# Patient Record
Sex: Female | Born: 1945
Health system: Southern US, Community
[De-identification: ages and names within clinical notes are randomized; demographics above are authoritative.]

## PROBLEM LIST (undated history)

## (undated) DIAGNOSIS — K760 Fatty (change of) liver, not elsewhere classified: Secondary | ICD-10-CM

## (undated) DIAGNOSIS — IMO0001 Reserved for inherently not codable concepts without codable children: Secondary | ICD-10-CM

## (undated) DIAGNOSIS — R531 Weakness: Secondary | ICD-10-CM

## (undated) DIAGNOSIS — N189 Chronic kidney disease, unspecified: Secondary | ICD-10-CM

## (undated) DIAGNOSIS — D649 Anemia, unspecified: Secondary | ICD-10-CM

## (undated) DIAGNOSIS — D62 Acute posthemorrhagic anemia: Secondary | ICD-10-CM

## (undated) DIAGNOSIS — Z860100 Personal history of colon polyps, unspecified: Secondary | ICD-10-CM

## (undated) DIAGNOSIS — E039 Hypothyroidism, unspecified: Secondary | ICD-10-CM

## (undated) DIAGNOSIS — F419 Anxiety disorder, unspecified: Secondary | ICD-10-CM

## (undated) DIAGNOSIS — R55 Syncope and collapse: Secondary | ICD-10-CM

## (undated) DIAGNOSIS — I456 Pre-excitation syndrome: Secondary | ICD-10-CM

## (undated) DIAGNOSIS — R112 Nausea with vomiting, unspecified: Secondary | ICD-10-CM

## (undated) DIAGNOSIS — M109 Gout, unspecified: Secondary | ICD-10-CM

## (undated) DIAGNOSIS — I10 Essential (primary) hypertension: Secondary | ICD-10-CM

## (undated) DIAGNOSIS — Z9289 Personal history of other medical treatment: Secondary | ICD-10-CM

## (undated) DIAGNOSIS — K5790 Diverticulosis of intestine, part unspecified, without perforation or abscess without bleeding: Secondary | ICD-10-CM

## (undated) DIAGNOSIS — I871 Compression of vein: Secondary | ICD-10-CM

## (undated) DIAGNOSIS — Z973 Presence of spectacles and contact lenses: Secondary | ICD-10-CM

## (undated) DIAGNOSIS — N393 Stress incontinence (female) (male): Secondary | ICD-10-CM

## (undated) DIAGNOSIS — Z9889 Other specified postprocedural states: Secondary | ICD-10-CM

## (undated) DIAGNOSIS — G8929 Other chronic pain: Secondary | ICD-10-CM

## (undated) DIAGNOSIS — J189 Pneumonia, unspecified organism: Secondary | ICD-10-CM

## (undated) DIAGNOSIS — M1711 Unilateral primary osteoarthritis, right knee: Secondary | ICD-10-CM

## (undated) DIAGNOSIS — D126 Benign neoplasm of colon, unspecified: Secondary | ICD-10-CM

## (undated) DIAGNOSIS — M549 Dorsalgia, unspecified: Secondary | ICD-10-CM

## (undated) DIAGNOSIS — M1990 Unspecified osteoarthritis, unspecified site: Secondary | ICD-10-CM

## (undated) DIAGNOSIS — E785 Hyperlipidemia, unspecified: Secondary | ICD-10-CM

## (undated) DIAGNOSIS — J40 Bronchitis, not specified as acute or chronic: Secondary | ICD-10-CM

## (undated) HISTORY — PX: OTHER SURGICAL HISTORY: SHX169

## (undated) HISTORY — PX: THYROID SURGERY: SHX805

## (undated) HISTORY — DX: Benign neoplasm of colon, unspecified: D12.6

## (undated) HISTORY — PX: APPENDECTOMY: SHX54

## (undated) HISTORY — PX: ABDOMINAL HYSTERECTOMY: SHX81

## (undated) HISTORY — DX: Fatty (change of) liver, not elsewhere classified: K76.0

## (undated) HISTORY — DX: Diverticulosis of intestine, part unspecified, without perforation or abscess without bleeding: K57.90

## (undated) HISTORY — PX: SKIN BIOPSY: SHX1

## (undated) HISTORY — DX: Hyperlipidemia, unspecified: E78.5

## (undated) HISTORY — PX: CARDIAC CATHETERIZATION: SHX172

## (undated) HISTORY — PX: ROTATOR CUFF REPAIR: SHX139

## (undated) HISTORY — PX: COLONOSCOPY: SHX174

## (undated) HISTORY — PX: BREAST BIOPSY: SHX20

## (undated) HISTORY — PX: BREAST ENHANCEMENT SURGERY: SHX7

## (undated) HISTORY — DX: Compression of vein: I87.1

## (undated) HISTORY — PX: BREAST EXCISIONAL BIOPSY: SUR124

## (undated) HISTORY — PX: CARPAL TUNNEL RELEASE: SHX101

---

## 1979-05-01 HISTORY — PX: CHOLECYSTECTOMY: SHX55

## 1997-07-29 ENCOUNTER — Encounter: Admission: RE | Admit: 1997-07-29 | Discharge: 1997-10-27 | Payer: Self-pay | Admitting: Orthopedic Surgery

## 1997-09-06 ENCOUNTER — Ambulatory Visit (HOSPITAL_COMMUNITY): Admission: RE | Admit: 1997-09-06 | Discharge: 1997-09-06 | Payer: Self-pay | Admitting: Orthopedic Surgery

## 1997-09-10 ENCOUNTER — Ambulatory Visit (HOSPITAL_COMMUNITY): Admission: RE | Admit: 1997-09-10 | Discharge: 1997-09-10 | Payer: Self-pay | Admitting: Orthopedic Surgery

## 1997-09-20 ENCOUNTER — Ambulatory Visit (HOSPITAL_COMMUNITY): Admission: RE | Admit: 1997-09-20 | Discharge: 1997-09-20 | Payer: Self-pay | Admitting: Orthopedic Surgery

## 1997-10-05 ENCOUNTER — Ambulatory Visit (HOSPITAL_BASED_OUTPATIENT_CLINIC_OR_DEPARTMENT_OTHER): Admission: RE | Admit: 1997-10-05 | Discharge: 1997-10-05 | Payer: Self-pay | Admitting: Orthopedic Surgery

## 1997-10-19 ENCOUNTER — Encounter: Admission: RE | Admit: 1997-10-19 | Discharge: 1998-01-17 | Payer: Self-pay | Admitting: Orthopedic Surgery

## 1997-12-20 ENCOUNTER — Encounter: Payer: Self-pay | Admitting: Orthopedic Surgery

## 1997-12-20 ENCOUNTER — Ambulatory Visit (HOSPITAL_COMMUNITY): Admission: RE | Admit: 1997-12-20 | Discharge: 1997-12-20 | Payer: Self-pay | Admitting: Orthopedic Surgery

## 1998-01-11 ENCOUNTER — Ambulatory Visit (HOSPITAL_BASED_OUTPATIENT_CLINIC_OR_DEPARTMENT_OTHER): Admission: RE | Admit: 1998-01-11 | Discharge: 1998-01-11 | Payer: Self-pay | Admitting: Orthopedic Surgery

## 1998-02-07 ENCOUNTER — Encounter: Admission: RE | Admit: 1998-02-07 | Discharge: 1998-05-08 | Payer: Self-pay | Admitting: Orthopedic Surgery

## 1998-05-16 ENCOUNTER — Encounter: Admission: RE | Admit: 1998-05-16 | Discharge: 1998-06-27 | Payer: Self-pay

## 1998-08-05 ENCOUNTER — Encounter: Admission: RE | Admit: 1998-08-05 | Discharge: 1998-11-03 | Payer: Self-pay | Admitting: Orthopedic Surgery

## 1999-04-12 ENCOUNTER — Encounter: Admission: RE | Admit: 1999-04-12 | Discharge: 1999-04-12 | Payer: Self-pay | Admitting: Obstetrics and Gynecology

## 1999-04-12 ENCOUNTER — Encounter: Payer: Self-pay | Admitting: Obstetrics and Gynecology

## 1999-06-23 ENCOUNTER — Encounter (INDEPENDENT_AMBULATORY_CARE_PROVIDER_SITE_OTHER): Payer: Self-pay | Admitting: Specialist

## 1999-06-23 ENCOUNTER — Ambulatory Visit (HOSPITAL_BASED_OUTPATIENT_CLINIC_OR_DEPARTMENT_OTHER): Admission: RE | Admit: 1999-06-23 | Discharge: 1999-06-23 | Payer: Self-pay | Admitting: Orthopedic Surgery

## 1999-07-22 ENCOUNTER — Encounter: Payer: Self-pay | Admitting: Family Medicine

## 1999-07-22 ENCOUNTER — Encounter: Admission: RE | Admit: 1999-07-22 | Discharge: 1999-07-22 | Payer: Self-pay | Admitting: Family Medicine

## 2000-11-13 ENCOUNTER — Encounter: Payer: Self-pay | Admitting: Gastroenterology

## 2000-11-13 ENCOUNTER — Ambulatory Visit (HOSPITAL_COMMUNITY): Admission: RE | Admit: 2000-11-13 | Discharge: 2000-11-13 | Payer: Self-pay | Admitting: Gastroenterology

## 2000-11-13 ENCOUNTER — Encounter (INDEPENDENT_AMBULATORY_CARE_PROVIDER_SITE_OTHER): Payer: Self-pay | Admitting: *Deleted

## 2001-01-14 ENCOUNTER — Encounter: Payer: Self-pay | Admitting: General Surgery

## 2001-01-16 ENCOUNTER — Ambulatory Visit (HOSPITAL_COMMUNITY): Admission: RE | Admit: 2001-01-16 | Discharge: 2001-01-17 | Payer: Self-pay | Admitting: General Surgery

## 2001-01-16 ENCOUNTER — Encounter (INDEPENDENT_AMBULATORY_CARE_PROVIDER_SITE_OTHER): Payer: Self-pay | Admitting: *Deleted

## 2001-04-07 ENCOUNTER — Encounter: Payer: Self-pay | Admitting: General Surgery

## 2001-04-07 ENCOUNTER — Ambulatory Visit (HOSPITAL_COMMUNITY): Admission: RE | Admit: 2001-04-07 | Discharge: 2001-04-07 | Payer: Self-pay | Admitting: General Surgery

## 2001-04-18 ENCOUNTER — Other Ambulatory Visit: Admission: RE | Admit: 2001-04-18 | Discharge: 2001-04-18 | Payer: Self-pay | Admitting: Obstetrics and Gynecology

## 2001-05-16 ENCOUNTER — Emergency Department (HOSPITAL_COMMUNITY): Admission: EM | Admit: 2001-05-16 | Discharge: 2001-05-16 | Payer: Self-pay | Admitting: *Deleted

## 2002-01-19 ENCOUNTER — Ambulatory Visit (HOSPITAL_COMMUNITY): Admission: RE | Admit: 2002-01-19 | Discharge: 2002-01-19 | Payer: Self-pay | Admitting: Specialist

## 2002-03-02 ENCOUNTER — Ambulatory Visit (HOSPITAL_COMMUNITY): Admission: RE | Admit: 2002-03-02 | Discharge: 2002-03-02 | Payer: Self-pay | Admitting: Specialist

## 2003-08-05 ENCOUNTER — Other Ambulatory Visit: Admission: RE | Admit: 2003-08-05 | Discharge: 2003-08-05 | Payer: Self-pay | Admitting: Obstetrics and Gynecology

## 2004-01-26 ENCOUNTER — Ambulatory Visit (HOSPITAL_COMMUNITY): Admission: RE | Admit: 2004-01-26 | Discharge: 2004-01-26 | Payer: Self-pay | Admitting: Neurosurgery

## 2004-02-23 ENCOUNTER — Inpatient Hospital Stay (HOSPITAL_COMMUNITY): Admission: RE | Admit: 2004-02-23 | Discharge: 2004-02-26 | Payer: Self-pay | Admitting: Neurosurgery

## 2004-04-30 HISTORY — PX: REDUCTION MAMMAPLASTY: SUR839

## 2004-04-30 HISTORY — PX: BACK SURGERY: SHX140

## 2004-09-04 ENCOUNTER — Inpatient Hospital Stay (HOSPITAL_COMMUNITY): Admission: AD | Admit: 2004-09-04 | Discharge: 2004-09-07 | Payer: Self-pay | Admitting: Internal Medicine

## 2004-09-08 ENCOUNTER — Ambulatory Visit (HOSPITAL_COMMUNITY): Admission: RE | Admit: 2004-09-08 | Discharge: 2004-09-08 | Payer: Self-pay | Admitting: Internal Medicine

## 2004-10-03 ENCOUNTER — Ambulatory Visit: Payer: Self-pay | Admitting: Gastroenterology

## 2004-10-06 ENCOUNTER — Ambulatory Visit: Payer: Self-pay | Admitting: Gastroenterology

## 2004-10-10 ENCOUNTER — Ambulatory Visit (HOSPITAL_COMMUNITY): Admission: RE | Admit: 2004-10-10 | Discharge: 2004-10-10 | Payer: Self-pay | Admitting: Neurology

## 2004-10-17 ENCOUNTER — Ambulatory Visit (HOSPITAL_COMMUNITY): Admission: RE | Admit: 2004-10-17 | Discharge: 2004-10-17 | Payer: Self-pay | Admitting: Neurology

## 2005-01-07 ENCOUNTER — Ambulatory Visit (HOSPITAL_COMMUNITY): Admission: RE | Admit: 2005-01-07 | Discharge: 2005-01-07 | Payer: Self-pay | Admitting: Neurosurgery

## 2005-01-25 ENCOUNTER — Encounter: Admission: RE | Admit: 2005-01-25 | Discharge: 2005-04-25 | Payer: Self-pay | Admitting: Neurosurgery

## 2005-08-15 ENCOUNTER — Ambulatory Visit (HOSPITAL_BASED_OUTPATIENT_CLINIC_OR_DEPARTMENT_OTHER): Admission: RE | Admit: 2005-08-15 | Discharge: 2005-08-16 | Payer: Self-pay | Admitting: Plastic Surgery

## 2005-08-15 ENCOUNTER — Encounter (INDEPENDENT_AMBULATORY_CARE_PROVIDER_SITE_OTHER): Payer: Self-pay | Admitting: Specialist

## 2005-10-10 ENCOUNTER — Encounter: Admission: RE | Admit: 2005-10-10 | Discharge: 2006-01-08 | Payer: Self-pay | Admitting: Neurosurgery

## 2006-12-12 ENCOUNTER — Encounter: Admission: RE | Admit: 2006-12-12 | Discharge: 2006-12-12 | Payer: Self-pay | Admitting: Neurosurgery

## 2007-05-01 HISTORY — PX: KNEE ARTHROSCOPY: SHX127

## 2007-10-07 ENCOUNTER — Encounter (INDEPENDENT_AMBULATORY_CARE_PROVIDER_SITE_OTHER): Payer: Self-pay | Admitting: Neurosurgery

## 2007-10-07 ENCOUNTER — Emergency Department (HOSPITAL_COMMUNITY): Admission: EM | Admit: 2007-10-07 | Discharge: 2007-10-07 | Payer: Self-pay | Admitting: Emergency Medicine

## 2007-10-07 ENCOUNTER — Ambulatory Visit: Payer: Self-pay | Admitting: Surgery

## 2007-10-07 ENCOUNTER — Inpatient Hospital Stay (HOSPITAL_COMMUNITY): Admission: AD | Admit: 2007-10-07 | Discharge: 2007-10-08 | Payer: Self-pay | Admitting: Neurosurgery

## 2007-10-14 ENCOUNTER — Ambulatory Visit (HOSPITAL_BASED_OUTPATIENT_CLINIC_OR_DEPARTMENT_OTHER): Admission: RE | Admit: 2007-10-14 | Discharge: 2007-10-14 | Payer: Self-pay | Admitting: Orthopedic Surgery

## 2007-10-23 ENCOUNTER — Encounter: Admission: RE | Admit: 2007-10-23 | Discharge: 2007-11-17 | Payer: Self-pay | Admitting: Orthopedic Surgery

## 2008-04-30 HISTORY — PX: ANKLE ARTHROPLASTY: SUR68

## 2008-05-09 ENCOUNTER — Ambulatory Visit (HOSPITAL_COMMUNITY): Admission: RE | Admit: 2008-05-09 | Discharge: 2008-05-09 | Payer: Self-pay | Admitting: Sports Medicine

## 2008-05-20 ENCOUNTER — Encounter: Admission: RE | Admit: 2008-05-20 | Discharge: 2008-05-20 | Payer: Self-pay | Admitting: Neurosurgery

## 2008-06-08 ENCOUNTER — Ambulatory Visit (HOSPITAL_COMMUNITY): Admission: RE | Admit: 2008-06-08 | Discharge: 2008-06-08 | Payer: Self-pay | Admitting: Neurology

## 2008-06-15 ENCOUNTER — Encounter: Admission: RE | Admit: 2008-06-15 | Discharge: 2008-06-15 | Payer: Self-pay | Admitting: Neurology

## 2008-08-17 ENCOUNTER — Ambulatory Visit (HOSPITAL_COMMUNITY): Admission: RE | Admit: 2008-08-17 | Discharge: 2008-08-18 | Payer: Self-pay | Admitting: Orthopedic Surgery

## 2008-12-13 ENCOUNTER — Ambulatory Visit (HOSPITAL_COMMUNITY): Admission: RE | Admit: 2008-12-13 | Discharge: 2008-12-13 | Payer: Self-pay | Admitting: Neurology

## 2009-01-27 ENCOUNTER — Inpatient Hospital Stay (HOSPITAL_COMMUNITY): Admission: AD | Admit: 2009-01-27 | Discharge: 2009-02-01 | Payer: Self-pay | Admitting: Internal Medicine

## 2009-01-27 ENCOUNTER — Ambulatory Visit: Payer: Self-pay | Admitting: Internal Medicine

## 2009-02-01 ENCOUNTER — Encounter: Payer: Self-pay | Admitting: Internal Medicine

## 2009-02-09 ENCOUNTER — Encounter (INDEPENDENT_AMBULATORY_CARE_PROVIDER_SITE_OTHER): Payer: Self-pay | Admitting: *Deleted

## 2009-02-09 DIAGNOSIS — I10 Essential (primary) hypertension: Secondary | ICD-10-CM

## 2009-02-11 DIAGNOSIS — M479 Spondylosis, unspecified: Secondary | ICD-10-CM | POA: Insufficient documentation

## 2009-02-11 DIAGNOSIS — I2510 Atherosclerotic heart disease of native coronary artery without angina pectoris: Secondary | ICD-10-CM

## 2009-02-11 DIAGNOSIS — O99810 Abnormal glucose complicating pregnancy: Secondary | ICD-10-CM | POA: Insufficient documentation

## 2009-02-11 DIAGNOSIS — I456 Pre-excitation syndrome: Secondary | ICD-10-CM | POA: Insufficient documentation

## 2009-02-11 DIAGNOSIS — M19079 Primary osteoarthritis, unspecified ankle and foot: Secondary | ICD-10-CM | POA: Insufficient documentation

## 2009-02-11 DIAGNOSIS — H409 Unspecified glaucoma: Secondary | ICD-10-CM

## 2009-02-11 DIAGNOSIS — E041 Nontoxic single thyroid nodule: Secondary | ICD-10-CM

## 2009-02-23 ENCOUNTER — Ambulatory Visit: Payer: Self-pay | Admitting: Internal Medicine

## 2009-02-23 DIAGNOSIS — R197 Diarrhea, unspecified: Secondary | ICD-10-CM

## 2009-02-23 DIAGNOSIS — Z9189 Other specified personal risk factors, not elsewhere classified: Secondary | ICD-10-CM | POA: Insufficient documentation

## 2009-02-23 DIAGNOSIS — R21 Rash and other nonspecific skin eruption: Secondary | ICD-10-CM

## 2009-02-23 LAB — CONVERTED CEMR LAB
ALT: 18 units/L (ref 0–35)
AST: 11 units/L (ref 0–37)
BUN: 18 mg/dL (ref 6–23)
Basophils Relative: 0 % (ref 0–1)
Calcium: 9.7 mg/dL (ref 8.4–10.5)
Chloride: 102 meq/L (ref 96–112)
Creatinine, Ser: 0.98 mg/dL (ref 0.40–1.20)
Eosinophils Absolute: 0.5 10*3/uL (ref 0.0–0.7)
Eosinophils Relative: 8 % — ABNORMAL HIGH (ref 0–5)
HCT: 33.1 % — ABNORMAL LOW (ref 36.0–46.0)
MCHC: 32.3 g/dL (ref 30.0–36.0)
MCV: 83 fL (ref >=78.0)
Neutrophils Relative %: 54 % (ref 43–77)
Platelets: 238 10*3/uL (ref 150–400)
RDW: 15.8 % — ABNORMAL HIGH (ref 11.5–15.5)
Total Bilirubin: 0.3 mg/dL (ref 0.3–1.2)
WBC: 6.7 10*3/uL (ref 4.0–10.5)

## 2009-02-24 ENCOUNTER — Ambulatory Visit: Payer: Self-pay | Admitting: Internal Medicine

## 2009-02-28 ENCOUNTER — Telehealth (INDEPENDENT_AMBULATORY_CARE_PROVIDER_SITE_OTHER): Payer: Self-pay | Admitting: *Deleted

## 2009-03-03 ENCOUNTER — Ambulatory Visit: Payer: Self-pay | Admitting: Internal Medicine

## 2009-03-17 ENCOUNTER — Ambulatory Visit: Payer: Self-pay | Admitting: Gastroenterology

## 2009-03-17 ENCOUNTER — Encounter (INDEPENDENT_AMBULATORY_CARE_PROVIDER_SITE_OTHER): Payer: Self-pay | Admitting: *Deleted

## 2009-03-17 DIAGNOSIS — Z860100 Personal history of colon polyps, unspecified: Secondary | ICD-10-CM | POA: Insufficient documentation

## 2009-03-17 DIAGNOSIS — D509 Iron deficiency anemia, unspecified: Secondary | ICD-10-CM

## 2009-03-17 LAB — CONVERTED CEMR LAB: IgA: 427 mg/dL — ABNORMAL HIGH (ref 68–378)

## 2009-04-05 ENCOUNTER — Ambulatory Visit: Payer: Self-pay | Admitting: Gastroenterology

## 2009-04-08 ENCOUNTER — Encounter: Payer: Self-pay | Admitting: Gastroenterology

## 2009-06-08 ENCOUNTER — Ambulatory Visit (HOSPITAL_COMMUNITY): Admission: RE | Admit: 2009-06-08 | Discharge: 2009-06-08 | Payer: Self-pay | Admitting: Neurosurgery

## 2009-06-29 ENCOUNTER — Encounter: Admission: RE | Admit: 2009-06-29 | Discharge: 2009-09-27 | Payer: Self-pay | Admitting: Neurosurgery

## 2009-07-04 ENCOUNTER — Ambulatory Visit: Payer: Self-pay | Admitting: Gastroenterology

## 2009-07-04 DIAGNOSIS — K5730 Diverticulosis of large intestine without perforation or abscess without bleeding: Secondary | ICD-10-CM | POA: Insufficient documentation

## 2009-07-06 ENCOUNTER — Encounter: Admission: RE | Admit: 2009-07-06 | Discharge: 2009-07-06 | Payer: Self-pay | Admitting: Obstetrics and Gynecology

## 2009-09-30 ENCOUNTER — Encounter: Admission: RE | Admit: 2009-09-30 | Discharge: 2009-12-29 | Payer: Self-pay | Admitting: Neurosurgery

## 2010-02-07 ENCOUNTER — Encounter
Admission: RE | Admit: 2010-02-07 | Discharge: 2010-03-31 | Payer: Self-pay | Source: Home / Self Care | Attending: Internal Medicine | Admitting: Internal Medicine

## 2010-05-12 ENCOUNTER — Encounter
Admission: RE | Admit: 2010-05-12 | Discharge: 2010-05-30 | Payer: Self-pay | Source: Home / Self Care | Attending: Surgery | Admitting: Surgery

## 2010-05-21 ENCOUNTER — Encounter: Payer: Self-pay | Admitting: Orthopedic Surgery

## 2010-05-30 NOTE — Assessment & Plan Note (Signed)
Summary: FOLLOW UP AFTER COLON/YF   History of Present Illness Visit Type: Follow-up Visit Primary GI MD: Elie Goody MD [facility] Primary Provider: Jarome Matin, MD Chief Complaint: Follow up from colonoscopy,Pt needs refill on Levsin if she needs to continue med, pt stated Align was helping her bloating but has quit taking it because it is to expensive History of Present Illness:   Mr. Milleson returns for followup of intermittent diarrhea and bloating. She notes her diarrhea is frequently brought on by certain high-fiber foods, especially raw fruits and vegetables. She states that Align was helping but she has not used this recently for financial reasons. Her recent colonoscopy was unremarkable, including random colon biopsies.    GI Review of Systems      Denies abdominal pain, acid reflux, belching, bloating, chest pain, dysphagia with liquids, dysphagia with solids, heartburn, loss of appetite, nausea, vomiting, vomiting blood, weight loss, and  weight gain.      Reports diarrhea.     Denies anal fissure, black tarry stools, change in bowel habit, constipation, diverticulosis, fecal incontinence, heme positive stool, hemorrhoids, irritable bowel syndrome, jaundice, light color stool, liver problems, rectal bleeding, and  rectal pain.   Current Medications (verified): 1)  Lisinopril 20 Mg Tabs (Lisinopril) .... Take 1 Tablet By Mouth Once A Day 2)  Levoxyl 50 Mcg Tabs (Levothyroxine Sodium) .... Take 1 Tablet By Mouth Once A Day 3)  Alphagan P 0.15 % Soln (Brimonidine Tartrate) .... One Drop Each Eye Daily 4)  Xalatan 0.005 % Soln (Latanoprost) .... One Drop Each Eye Daily 5)  Alprazolam 0.5 Mg Tabs (Alprazolam) .... Take 1 Tablet By Mouth At Bedtime 6)  Darvocet-N 100 100-650 Mg Tabs (Propoxyphene N-Apap) .... Take One By Mouth As Needed 7)  Levsin 0.125 Mg Tabs (Hyoscyamine Sulfate) .Marland Kitchen.. 1-2 Tablets By Mouth Before Meals As Needed  Allergies: 1)  ! Codeine 2)  ! Talwin 3)   ! Demerol 4)  ! Hydrocodone 5)  ! Nexium 6)  ! * Ivp Dye 7)  ! Allopurinol (Allopurinol) 8)  ! * Colchicine  Past History:  Past Surgical History: Last updated: 03/17/2009 Appendectomy Cholecystectomy Hysterectomy Benign breast biopsy c section X 3 lumbar fusion arthoscopy right knee arthoscopy left ankle Carpal Tunnel Release bilateral X 2 nerve surgery bilateral X 2 Thyroid Surgery Cataract Extraction bilateral lens implant bilateral skin cancer moles removed multiple sites bladder tack  Family History: Last updated: 03/17/2009 No FH of Colon Cancer Family History of Diabetes: mother, maternal grandmother Family History of Heart Disease: father, maternal grandmother  Social History: Last updated: 03/17/2009 Divorced Patient has never smoked.  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         The pertinent positives and negatives are noted as above and in the HPI. All other ROS were reviewed and were negative.   Vital Signs:  Patient profile:   65 year old female Menstrual status:  postmenopausal Height:      63 inches Weight:      245 pounds BMI:     43.56 BSA:     2.11 Pulse rate:   66 /  minute Pulse rhythm:   regular BP sitting:   144 / 88  (left arm)  Vitals Entered By: Merri Ray CMA Duncan Dull) (July 04, 2009 9:09 AM)  Physical Exam  General:  Well developed, well nourished, no acute distress. Head:  Normocephalic and atraumatic. Eyes:  PERRLA, no icterus. Mouth:  No deformity or lesions, dentition normal. Lungs:   Clear throughout to auscultation. Heart:  Regular rate and rhythm; no murmurs, rubs,  or bruits. Abdomen:  Soft, nontender and nondistended. No masses, hepatosplenomegaly or hernias noted. Normal bowel sounds. Psych:  Alert and cooperative. Normal mood and affect.  Impression & Recommendations:  Problem # 1:  DIARRHEA (ICD-787.91) Diarrhea with intermittent bloating. Her symptoms appear to be functional and dietary related. She will adjust her diet as appropriate. Continue Align daily, or once every other day. Continue Levsin 1-2 before meals as needed.  Problem # 2:  DIVERTICULOSIS-COLON (ICD-562.10)  Patient Instructions: 1)  Please continue current medications.  2)  Please schedule a follow-up appointment as needed.  3)  Copy sent to : Jarome Matin, MD 4)  The medication list was reviewed and reconciled.  All changed / newly prescribed medications were explained.  A complete medication list was provided to the patient / caregiver.

## 2010-06-30 ENCOUNTER — Encounter: Payer: Commercial Managed Care - PPO | Attending: Surgery | Admitting: Dietician

## 2010-06-30 ENCOUNTER — Encounter: Admit: 2010-06-30 | Payer: Self-pay | Admitting: Internal Medicine

## 2010-06-30 DIAGNOSIS — I10 Essential (primary) hypertension: Secondary | ICD-10-CM | POA: Insufficient documentation

## 2010-06-30 DIAGNOSIS — Z713 Dietary counseling and surveillance: Secondary | ICD-10-CM | POA: Insufficient documentation

## 2010-08-04 LAB — COMPREHENSIVE METABOLIC PANEL
ALT: 27 U/L (ref 0–35)
ALT: 46 U/L — ABNORMAL HIGH (ref 0–35)
AST: 36 U/L (ref 0–37)
Albumin: 3.1 g/dL — ABNORMAL LOW (ref 3.5–5.2)
Alkaline Phosphatase: 70 U/L (ref 39–117)
Alkaline Phosphatase: 78 U/L (ref 39–117)
BUN: 5 mg/dL — ABNORMAL LOW (ref 6–23)
BUN: 7 mg/dL (ref 6–23)
CO2: 26 mEq/L (ref 19–32)
CO2: 27 mEq/L (ref 19–32)
Calcium: 7.9 mg/dL — ABNORMAL LOW (ref 8.4–10.5)
Calcium: 8.2 mg/dL — ABNORMAL LOW (ref 8.4–10.5)
Chloride: 105 mEq/L (ref 96–112)
Creatinine, Ser: 0.87 mg/dL (ref 0.4–1.2)
GFR calc Af Amer: >60 mL/min (ref >60)
GFR calc non Af Amer: >60 mL/min (ref >60)
GFR calc non Af Amer: >60 mL/min (ref >60)
Glucose, Bld: 90 mg/dL (ref 70–99)
Glucose, Bld: 91 mg/dL (ref 70–99)
Potassium: 3.2 mEq/L — ABNORMAL LOW (ref 3.5–5.1)
Potassium: 4 mEq/L (ref 3.5–5.1)
Sodium: 138 mEq/L (ref 135–145)
Sodium: 139 mEq/L (ref 135–145)
Total Bilirubin: 0.2 mg/dL — ABNORMAL LOW (ref 0.3–1.2)
Total Protein: 6.3 g/dL (ref 6.0–8.3)

## 2010-08-04 LAB — FECAL LACTOFERRIN, QUANT: Fecal Lactoferrin: NEGATIVE

## 2010-08-04 LAB — CBC
HCT: 27.7 % — ABNORMAL LOW (ref 36.0–46.0)
HCT: 28.1 % — ABNORMAL LOW (ref 36.0–46.0)
HCT: 30.2 % — ABNORMAL LOW (ref 36.0–46.0)
Hemoglobin: 10.1 g/dL — ABNORMAL LOW (ref 12.0–15.0)
Hemoglobin: 9.2 g/dL — ABNORMAL LOW (ref 12.0–15.0)
Hemoglobin: 9.7 g/dL — ABNORMAL LOW (ref 12.0–15.0)
MCHC: 33.2 g/dL (ref 30.0–36.0)
MCHC: 33.6 g/dL (ref 30.0–36.0)
MCHC: 34.5 g/dL (ref 30.0–36.0)
MCV: 83.5 fL (ref 78.0–100.0)
Platelets: 139 10*3/uL — ABNORMAL LOW (ref 150–400)
Platelets: 145 10*3/uL — ABNORMAL LOW (ref 150–400)
RBC: 3.32 MIL/uL — ABNORMAL LOW (ref 3.87–5.11)
RBC: 3.38 MIL/uL — ABNORMAL LOW (ref 3.87–5.11)
RBC: 3.59 MIL/uL — ABNORMAL LOW (ref 3.87–5.11)
RDW: 14.2 % (ref 11.5–15.5)
WBC: 5.2 10*3/uL (ref 4.0–10.5)

## 2010-08-04 LAB — IRON AND TIBC
Iron: 28 ug/dL — ABNORMAL LOW (ref 42–135)
Saturation Ratios: 11 % — ABNORMAL LOW (ref 20–55)
TIBC: 262 ug/dL (ref 250–470)

## 2010-08-04 LAB — FERRITIN: Ferritin: 504 ng/mL — ABNORMAL HIGH (ref 10–291)

## 2010-08-04 LAB — BASIC METABOLIC PANEL
CO2: 26 mEq/L (ref 19–32)
Calcium: 8.1 mg/dL — ABNORMAL LOW (ref 8.4–10.5)
GFR calc Af Amer: >60 mL/min (ref >60)
GFR calc non Af Amer: >60 mL/min (ref >60)
Glucose, Bld: 93 mg/dL (ref 70–99)
Potassium: 3.8 mEq/L (ref 3.5–5.1)
Sodium: 139 mEq/L (ref 135–145)

## 2010-08-04 LAB — URINALYSIS, ROUTINE W REFLEX MICROSCOPIC
Glucose, UA: NEGATIVE mg/dL
Hgb urine dipstick: NEGATIVE
Specific Gravity, Urine: 1.023 (ref 1.005–1.030)
pH: 5.5 (ref 5.0–8.0)

## 2010-08-04 LAB — STOOL CULTURE

## 2010-08-04 LAB — TROPONIN I: Troponin I: 0.01 ng/mL (ref 0.00–0.06)

## 2010-08-04 LAB — URINE MICROSCOPIC-ADD ON

## 2010-08-04 LAB — LIPASE, BLOOD: Lipase: 19 U/L (ref 11–59)

## 2010-08-04 LAB — CLOSTRIDIUM DIFFICILE EIA
C difficile Toxins A+B, EIA: NEGATIVE
C difficile Toxins A+B, EIA: NEGATIVE

## 2010-08-09 LAB — COMPREHENSIVE METABOLIC PANEL
Albumin: 4 g/dL (ref 3.5–5.2)
Alkaline Phosphatase: 74 U/L (ref 39–117)
BUN: 20 mg/dL (ref 6–23)
Potassium: 4.4 mEq/L (ref 3.5–5.1)
Sodium: 139 mEq/L (ref 135–145)
Total Protein: 7.6 g/dL (ref 6.0–8.3)

## 2010-08-09 LAB — PROTIME-INR: INR: 1 (ref 0.00–1.49)

## 2010-08-09 LAB — CBC
HCT: 34.2 % — ABNORMAL LOW (ref 36.0–46.0)
Platelets: 262 10*3/uL (ref 150–400)
RDW: 13.9 % (ref 11.5–15.5)

## 2010-08-11 ENCOUNTER — Ambulatory Visit: Payer: Self-pay | Admitting: Dietician

## 2010-08-25 ENCOUNTER — Ambulatory Visit: Payer: Self-pay | Admitting: Dietician

## 2010-09-12 NOTE — Op Note (Signed)
NAMESHIR, BERGMAN NO.:  [id number]   MEDICAL RECORD NO.:  [id number]           PATIENT TYPE:   LOCATION:                                 FACILITY:   PHYSICIAN:  Elana Alm. Thurston Hole, M.D. 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   PROCEDURES:  1. Right knee examination under anesthesia followed by arthroscopic      partial medial and lateral meniscectomies.  2. Right knee chondroplasty with partial synovectomy.   SURGEON:  Elana Alm. Thurston Hole, MD   ASSISTANT:  Julien Girt, PA   ANESTHESIA:  General.   OPERATIVE TIME:  40 minutes.   COMPLICATIONS:  None.   INDICATION FOR PROCEDURE:  Ms. Demps is a 65 year old woman who has  had 3-4 weeks of increasing right knee pain with examined MRI  documenting meniscal tearing with chondromalacia and synovitis.  She has  failed conservative care, and is now to undergo arthroscopy.   DESCRIPTION:  Ms. Caputi was brought to the operating room on October 13, 2007, after a knee block was placed in holding area by anesthesia.  She  was placed on operating table in supine position.  After being placed  under general anesthesia, her right knee was examined.  Range of motion  0-120 degrees, 1 to 2+ crepitation.  Knee stable ligamentous exam with  normal patellar tracking.  She received Ancef 1 gram IV preoperatively  for prophylaxis.  Initially, through an anterolateral portal, the  arthroscope with a pump attached was placed and through an anteromedial  portal, an arthroscopic probe was placed.  On initial inspection of the  medial compartment, the articular cartilage showed 30-40% grade 3  chondromalacia which was  debrided.  Medial meniscus tear posterior  medial horn of which 30-40% was resected back to a stable rim.  ACL and  PCL were normal.  Lateral compartment showed 30-40% grade 3  chondromalacia in the lateral tibia plateau.  This was debrided.  Only  grade 1 and 2 changes in the lateral femoral condyle.  Lateral meniscus  tear of the posterior lateral horn was resected back to a stable rim.  Patellofemoral joint showed 30% grade 3 chondromalacia, which was  debrided.  The patella tracked normally.  Moderate synovitis and medial  and lateral gutters were debrided, otherwise, this was free of  pathology.  After this was done, it was felt that all pathology have  been satisfactorily addressed.  The instruments were removed.  The  portals were closed with 3-0 nylon suture and injected with 0.25%  Marcaine with epinephrine and 4 mg of morphine.  Sterile dressings were  applied, and the patient awakened and taken to the recovery room in  stable condition.   FOLLOWUP CARE:  Ms. Scalici will be followed as an outpatient on  Darvocet for pain.  She will be back in the office in a week for sutures  out and followup.      Robert A. Thurston Hole, M.D.  Electronically Signed     RAW/MEDQ  D:  10/14/2007  T:  10/15/2007  Job:  [id number]

## 2010-09-12 NOTE — Op Note (Signed)
[name], [name] NO.:  [id number]   MEDICAL RECORD NO.:  [id number]          PATIENT TYPE:  OIB   LOCATION:  5018                         FACILITY:  [facility]   PHYSICIAN:  Nadara Mustard, MD     DATE OF BIRTH:  [birth year]   DATE OF PROCEDURE:  08/17/2008  DATE OF DISCHARGE:                               OPERATIVE REPORT   PREOPERATIVE DIAGNOSIS:  Impingement, left ankle.   POSTOPERATIVE DIAGNOSIS:  Impingement, left ankle.   PROCEDURE:  Left ankle arthroscopy with decompression and debridement of  the anterior bone spurs.   SURGEON:  Nadara Mustard, MD   ANESTHESIA:  General.   ESTIMATED BLOOD LOSS:  Minimal.   ANTIBIOTICS:  Kefzol 1 g.   DRAINS:  None.   COMPLICATIONS:  None.   TOURNIQUET TIME:  None.   DISPOSITION:  To PACU in stable condition.   INDICATIONS FOR PROCEDURE:  The patient is a 65 year old woman who  presents with impingement symptoms of her left ankle.  She has had  temporary relief with cortisone injection.  She has failed conservative  care and presents at this time for arthroscopic intervention.  Risks and  benefits were discussed including infection, neurovascular injury,  persistent pain, and need for additional surgery.  The patient states  she understands and wished to proceed at this time.   DESCRIPTION OF PROCEDURE:  The patient was brought to OR room 10 and  underwent a general anesthetic.  After an adequate level of anesthesia  was obtained, the patient's left lower extremity was prepped using  DuraPrep, draped into a sterile field, and the ankle was placed in the  stirrup traction, approximately 10 pounds of distraction.  The scope was  inserted through the anteromedial portal, and anterolateral portal was  established for a working portal.  Visualization showed a significant  amount synovitis with impingement.  The synovial tissue was debrided  with both the shaver and the electrical wand.  There was osteophytic  bone spur anteriorly with some osteochondral changes, and the  osteochondral defect was resected with a ring curette.  Bone spurs were  also resected with a ring curette.  Evaluation of the tibial plateau  showed no other osteochondral defects or loose bodies.  The joint was  irrigated with normal saline.  Instruments were removed.  The ports were  closed using 3-0 nylon.  The wound was covered Adaptic, orthopedic  sponges, ABD dressing, Webril, and Coban.  The patient was extubated and  taken to PACU in stable condition.  Plan for overnight observation with  followup in the office in 2 weeks.      Nadara Mustard, MD  Electronically Signed     MVD/MEDQ  D:  08/17/2008  T:  08/18/2008  Job:  [id number]

## 2010-09-12 NOTE — H&P (Signed)
NAMEJAYNEE, [name] NO.:  [id number]   MEDICAL RECORD NO.:  [id number]          PATIENT TYPE:  INP   LOCATION:  3019                         FACILITY:  [facility]   PHYSICIAN:  Hewitt Shorts, M.D.DATE OF BIRTH:  Dec 14, 1945   DATE OF ADMISSION:  10/07/2007  DATE OF DISCHARGE:                              HISTORY & PHYSICAL   HISTORY OF PRESENT ILLNESS:  This patient is a 65 year old right-handed  black female who is a patient of mine since September 2005.  She is  status post an L5 skill procedure, resection of a left L5-S1 foraminal  synovial cyst and bilateral L5-S1 posterolateral  arthrodesis with 90D  posterior instrumentation in October 2005, that I performed.  She has  been reevaluated most recently in September 2006, but presented acutely  today at the request of Dr. Salvatore Marvel for evaluation of possible  right lumbar radiculopathy.   The patient explained she has a history of pain developing about a week  ago in the right lower extremity, currently in the right thigh and knee  yesterday, extending to the right leg towards the foot especially with  some numbness and tingling.  Last week, she saw Dr. Thurston Hole, and he did a  cortisone injection in the right knee.  She was coming to work today,  but the pain had worsened, and she had difficulty driving because of the  pain in the right lower extremity and with her left foot.  She came to  the hospital where she works but was in too much discomfort and went to  the emergency room and was evaluated.  They referred her to Dr. Sherene Sires  office, and he asked [place] to evaluate her.  She was seen at the office and  subsequently admitted for pain management and further workup and  evaluation.   PAST MEDICAL HISTORY:  Notable for history of hypertension over 40  years.  She has a history of Wolff-Parkinson-White and history of  glaucoma, but denies any history of myocardial infarction, cancer,  stroke, diabetes,  peptic ulcers, or lung disease.  Primarily physician  is Dr. Jarome Matin.   PAST SURGICAL HISTORY:  1. Three cesarean sections.  2. Cholecystectomy.  3. Hysterectomy and bilateral salpingo-oophorectomy.  4. Raz procedure by Dr. Patsi Sears.  5. Breast biopsy by Dr. Maple Hudson.  6. Three right rotator cuff surgeries by Dr. Thurston Hole.  7. Ulnar nerve and carpal tunnel surgery.  8. Foot surgery.  9. Thyroidectomy by Dr. Bonnee Quin.  10.Bilateral cataract surgery and further laser surgery on the eyes      bilaterally.  11.She had lumbar fusion as described above in October 2005.  12.Breast reduction in 2006.   ALLERGIES:  She reports an allergic to numerous medications including  TALWIN, DEMEROL, IVP CONTRAST, and does not tolerate because of nausea,  vomiting, many narcotics including HYDROCODONE, OXYCODONE, DILAUDID, and  so on.   CURRENT MEDICATIONS:  Include,  1. Premarin 1.25 mg daily.  2. Celebrex 200 mg daily.  3. Maxzide half a tablet daily.  4. Xalatan eye drops nightly for glaucoma.  5. Xanax 0.5  mg nightly for anxiety, she appears to have passed on.   FAMILY HISTORY:  Her mother died at age 67 she had a stroke, father at  age 63 of heart attack, he also has glaucoma.   SOCIAL HISTORY:  The patient is divorced.  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   REVIEW OF SYSTEMS:  Notable for those described in the history of  present illness and past medical history, but on 14-point review of  system, she is otherwise unremarkable.   PHYSICAL EXAMINATION:  GENERAL:  The patient is a well-developed and  well-nourished black female in discomfort, but in no acute distress.  VITAL SIGNS:  Height is 5 feet 3 inches.  Weight is 224 pounds.  She is  afebrile.  LUNGS:  Clear to auscultation.  She has symmetrical respiratory  excursion.  HEART:  Regular rate and rhythm.  Normal S1 and S2.  There is no murmur.  EXTREMITIES:  Shows no clubbing,  cyanosis, or edema.  MUSCULOSKELETAL:  Shows discomfort on moving of the distal right lower  extremity.  However, there does not seem to be any discomfort on  internal and external rotation of the hips.  There is a noticeable  tenderness in the anterolateral aspect of the right knee, not on the  anteromedial aspect of the right knee.  NEUROLOGIC:  Shows 5/5strength of the left lower extremity including the  iliopsoas, quadriceps, dorsiflexor, extensor, plantar flexor.  In the  right lower extremity, her ability to exert off is limited to the pain,  but she is able to move the extremity with all muscle groups, the  iliopsoas and quadriceps are at least 3 to 4 minus.  The dorsiflexor is  at least 3 and the plantar flexor is at least 4 to 4+.  Sensation seems  to be somewhat diminished in the distal right lower extremity compared  to the distal left lower extremity.  Reflexes are minimal in the biceps,  brachioradialis, and triceps.  The right quadriceps is minimal to the  left is 1, gastrocnemius minimal bilaterally.  Toes are down going  bilaterally.   IMPRESSION:  Disabling right lower extremity pain is unclear whether  this represents a lumbar radiculopathy or an intrinsic problem involving  the right lower extremity such as the knee.  The patient has had  previous L5-S1 fusion.  Last workup was done in September 2006 with MRI.   PLAN:  The patient will be admitted to the hospital for parenteral  medications for pain management and intravenous line will be started,  and we will obtain an MRI of lumbar spine without and with gadolinium to  give the patient further recommendations.      Hewitt Shorts, M.D.  Electronically Signed     RWN/MEDQ  D:  10/07/2007  T:  10/08/2007  Job:  [id number]

## 2010-09-15 NOTE — Op Note (Signed)
NAMELILLYONNA, ARMSTEAD NO.:  [id number]   MEDICAL RECORD NO.:  [id number]          PATIENT TYPE:  AMB   LOCATION:  DSC                          FACILITY:  [facility]   PHYSICIAN:  Etter Sjogren, M.D.     DATE OF BIRTH:  11-09-1945   DATE OF PROCEDURE:  08/15/2005  DATE OF DISCHARGE:                                 OPERATIVE REPORT   PREOPERATIVE DIAGNOSIS:  Bilateral macromastia with upper back, neck and  shoulder pain.   POSTOPERATIVE DIAGNOSIS:  Bilateral macromastia with upper back, neck and  shoulder pain.   PROCEDURE PERFORMED:  Bilateral breast reduction.   SURGEON:  Etter Sjogren, M.D.   ASSISTANT:  Pleas Patricia, M.D.   ANESTHESIA:  General.   ESTIMATED BLOOD LOSS:  125 mL.   CLINICAL NOTE:  A 65 year old woman who complains of large breasts with  upper back, neck and shoulder pain, bra strap shoulder grooving.  She  desired a breast reduction.  The nature of this procedure and the risks were  discussed with her in great detail and she understood those risks and  possible complications and wished to proceed.  The right breast was felt to  be slightly larger than the left, although not much.   DESCRIPTION OF PROCEDURE:  The patient was placed in a full standing  position in the holding area and marked for the breast reduction.  She was  taken to the operating room and placed supine.  After successful induction  of general anesthesia, she was prepped with Betadine and draped with sterile  drapes.  A 42 mm marker was used to mark the nipple-areolar complex and 8 cm  wide inferior nipple-areolar pedicles were designed.  All incisions made and  the free nipple-areolar pedicles de-epithelialized.  These inferior pedicles  were then isolated from surrounding tissue using the electrocautery,  beveling in an outward direction both medially and laterally in order to  ensure a broader attachment at the level of the chest wall than was present  at the level of  the skin.  At the conclusion of this dissection, the nipple  complexes were inspected.  They had excellent color and bright red bleeding  around their periphery consistent with viability.  The resections were  performed medial, central and lateral.  The total resected, 689 g from the  larger right side, 664 g from the slightly smaller left side.  Thorough  irrigation with saline and meticulous hemostasis with the electrocautery.  Next, hemostasis having been achieved, the layered closure with 2-0 Vicryl  interrupted inverted deep sutures in the inverted T position, followed by 3-  0 Monocryl interrupted deep sutures, followed by 4-0 Monocryl running  subcuticular suture.  A measurement was then taken 5 cm up from the  inframammary crease and the 42 mm marker used to mark the new nipple-areolar  site.  This tissue was resected and the nipple-areolar complexes brought  through this opening and were again inspected.  Once again they were found  to have excellent color and bright red bleeding around the periphery  consistent with viability.  Nipple-areolar insetting  with 3-0 Monocryl  interrupted, inverted deep sutures, 4-0 Monocryl running subcuticular  suture.  Steri-  Strips, dry sterile dressing and a circumferential Ace wrap applied, and she  was transferred to the recovery room in stable condition, having tolerated  the procedure well.   DISPOSITION:  She will be observed in the RCC overnight.      Etter Sjogren, M.D.  Electronically Signed     DB/MEDQ  D:  08/15/2005  T:  08/16/2005  Job:  [id number]

## 2010-09-15 NOTE — Consult Note (Signed)
[name], LANDSBERG NO.:  [id number]   MEDICAL RECORD NO.:  [id number]          PATIENT TYPE:  INP   LOCATION:  2017                         FACILITY:  [facility]   PHYSICIAN:  Vesta Mixer, M.D. DATE OF BIRTH:  Dec 18, 1945   DATE OF CONSULTATION:  09/04/2004  DATE OF DISCHARGE:                                   CONSULTATION   Ms. Locey is a 65 year old black female with a history of chest pain,  history of tachycardia diagnosed as WPW, hypertension, and gestational  diabetes. She is admitted with chest pain.   The patient has a long history of intermittent chest pain. She has had  several heart catheterizations over the past ten years, both of which have  been negative. She has also had stress Cardiolite studies which have been  negative.   Yesterday she developed chest pain and pressure. The patient radiated up  through to her jaw. This morning she called to get an appointment with Dr.  Katrinka Blazing and went to her primary doctor. She was found to have significant  chest pain and was admitted to the hospital for further evaluation.   The patient says that the pain lasted for an hour or so at a time. They are  not relieved with any specific activity such as  change of position, taking  a deep breath, eating, drinking, or walking around. She states that they are  really not exacerbated by exertion either.   CURRENT MEDICATIONS:  1.  Aspirin 325 mg daily.  2.  Synthroid 0.075 mg daily.  3.  Protonix once a day.  4.  Lopressor 50 mg p.o. b.i.d.   ALLERGIES:  CODEINE and TALWIN.   PAST MEDICAL HISTORY:  1.  Chest pain. The patient has had several normal catheterizations in the      past.  2.  History of tachycardia. The patient was diagnosed as having WPW. She has      not been noted to have delta waves on her EKG and has not had any recent      episodes of tachycardia.  3.  Glaucoma.  4.  Hypertension.   SOCIAL HISTORY:  The patient is a nonsmoker. She  drinks alcohol rarely.   FAMILY HISTORY:  Positive for coronary artery disease.   REVIEW OF SYSTEMS:  Reviewed and is essentially negative.   PHYSICAL EXAMINATION:  GENERAL: She is a middle-age female in no acute  distress. She is alert and oriented times three, and mood and affect are  normal. She appears to be in no acute distress, although she complains of  having 8/10 chest pain currently.  VITAL SIGNS: Blood pressure is 120/80, heart rate 70.  HEENT: 2+ carotids. There are no bruits, JVD, or thyromegaly.  LUNGS: Clear to auscultation.  HEART: Regular rate. S1 and S2.  She has no chest wall pain.  ABDOMEN: Good bowel sounds and is nontender.  EXTREMITIES: She has no clubbing, cyanosis, or edema.  NEUROLOGIC: Nonfocal.  Cranial nerves II-XII are intact. Motor and sensory  function are intact.   EKG reveals a normal sinus rhythm. She  has no ST-T wave changes. I do not  see any delta waves.   Her laboratory data is pending.   Ms. Brau presents with some episodes of chest pain. Her EKG is completely  negative. I am optimistic by the fact that she has had negative heart  catheterizations in the past. We will continue to observe her. If she starts  having any episodes of chest pain then we will consider starting heparin or  Integrilin. I suspect that this is noncardiac for the time being. Will check  her enzymes. Further recommendations and plan per Dr. Katrinka Blazing.      PJN/MEDQ  D:  09/04/2004  T:  09/04/2004  Job:  [id number]   cc:   Lesleigh Noe, M.D.  [address]  [address]  [place]  [place] [address]  Fax: [phone]

## 2010-09-15 NOTE — Discharge Summary (Signed)
NAMEKEALIE, BARRIE NO.:  [id number]   MEDICAL RECORD NO.:  [id number]          PATIENT TYPE:  INP   LOCATION:  3005                         FACILITY:  [facility]   PHYSICIAN:  Payton Doughty, M.D.      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   PROCEDURES:  L5-S1 laminectomy by Dr. Newell Coral.   COMPLICATIONS:  None.   DISCHARGE STATUS:  Alive and well.   A 65 year old right-handed white lady whose history and physical is  recounted on the chart.  She had pain in the back.  She had a 5-1Gill  procedure and removal of synovial cyst and effusion at 5-1.  Examination is  intact.  On October 26 postoperatively her strength was 4.  Wound was dry.  November 27 she was awake and alert.  Foley was removed.  She had difficulty  getting up and about.  By October 29 she was up, eating and voiding  normally.  Her strength was full.  Incision was dry and she was discharged  home in the care of her family.       MWR/MEDQ  D:  04/04/2004  T:  04/04/2004  Job:  [id number]

## 2010-09-15 NOTE — Discharge Summary (Signed)
NAMESUBRENA, [name] NO.:  [id number]   MEDICAL RECORD NO.:  [id number]          PATIENT TYPE:  INP   LOCATION:  2017                         FACILITY:  [facility]   PHYSICIAN:  Barry Dienes. Eloise Harman, M.D.DATE OF BIRTH:  [birth year]   DATE OF ADMISSION:  09/04/2004  DATE OF DISCHARGE:  09/07/2004                                 DISCHARGE SUMMARY   HISTORY OF PRESENT ILLNESS:  The patient is a 65 year old black female with  a history of hypertension, Wolff-Parkinson-White syndrome, gestational  diabetes mellitus, and atypical chest pain for which she has had previous  normal caths.  She complained of 2 weeks of intermittent substernal chest  pain.  Last evening, the pain started at approximately midnight and  continued throughout the next day.  The pain was 8/10 intensity at the  office and felt pressure-like in the midsternal region with radiation to the  left jaw and left arm.  This pain was associated with mild shortness of  breath.  There was no diaphoresis or nausea.  In our office, she was treated  with Prilosec and sodium bicarbonate with no change in her symptoms.  In  2002, she had atypical chest pain and had a Cardiolite exercise test that  reportedly was normal.  She has also had cardiac catheterizations in the  past that were reportedly unremarkable.   PAST MEDICAL HISTORY:  Her past medical history is otherwise significant  for:  1.  Glaucoma.  2.  A left thyroid nodule.  3.  Osteoarthritis of the lumbar spine for which she takes a fair amount of      nonsteroidal anti-inflammatory drugs.   MEDICATIONS PRIOR TO ADMISSION:  1.  Ziac 5 mg one tab p.o. daily.  2.  Premarin 1.25 mg p.o. daily.  3.  Xanax 0.5 mg p.o. nightly.  4.  Aleve p.r.n. pain.  5.  Alphagan one drop in each eye once daily.  6.  Maxzide 25 mg one-half tab p.o. twice daily.  7.  Levoxyl 50 mcg p.o. daily.  8.  Acular 5% one drop in each eye twice daily.  9.  Xalatan eye drops.  10.  Quinine sulfate 325 mg once daily.  11. Aspirin 81 mg daily.  12. Motrin 800 mg once daily as needed for arthritis pain.   ALLERGIES:  CODEINE has been associated with nausea, TALWIN has been  associated with shortness of breath and she has had some kind of IV CONTRAST  DYE allergy.  In addition, NEXIUM was associated with a rash.   INITIAL PHYSICAL EXAM:  VITAL SIGNS:  Blood pressure 120/80, pulse 60,  respirations 20, pulse oxygen saturation 100% on room air.  GENERAL:  She is an overweight black female who had a dry cough, but no  shortness of breath.  HEENT:  Exam was significant for mild rhinitis.  NECK:  Neck was supple and without jugular venous distention or carotid  bruit.  CHEST:  Chest was clear to auscultation.  HEART:  Regular rate and rhythm.  ABDOMEN:  Abdomen was benign.  EXTREMITIES:  Extremities were without edema.  NEUROLOGICAL:  Exam was nonfocal.   LABORATORY STUDIES:  EKG showed normal sinus rhythm, nonspecific ST segment  and T wave abnormalities, and no significant interval change versus October 29, 2000.   HOSPITAL COURSE:  The patient was admitted to a medical bed with telemetry.  On the initial evening of admission, she had intermittent substernal chest  pressure with jaw radiation that was treated with IV heparin and IV  nitroglycerin.  Her EKG again showed no signs of acute changes.  She was  seen by a Cardiology consultant, Dr. Garnette Scheuermann, who did a Cardiolite  exercise test that showed a left ventricular ejection fraction of  approximately 40% with no evidence of ischemia.  Although her cardiac  isoenzymes were normal, he recommended a cardiac catheterization to rule out  the possibility of coronary disease, given her depressed LV systolic  function.  The cardiac catheterization showed approximately 50% proximal  stenosis of the circumflex with 40% to 50% mid left anterior descending  stenosis and normal left ventricular systolic function.  After the  cardiac  catheterization, he felt that GI etiologies of the chest discomfort were  most likely.   Hospital course was also notable for a mild anemia with an hematocrit of  approximately 30.  Full laboratory tests including iron studies, B12,  ferritin, folate, hemoglobin electrophoresis and a SPEP test were ordered  with full results pending at the time of dictation.  She showed no evidence  of iron deficiency anemia with a iron saturation of 28%.  She also had  normal B12, folate, and ferritin levels.  Her TSH was 1.73.   PROCEDURES:  1.  Cardiolite exercise test.  2.  Cardiac catheterization.   COMPLICATIONS:  None.   CONDITION ON DISCHARGE:  Her chest pain has resolved; she continues to have  left-sided facial numbness.  Her most recent vital signs include a blood  pressure of 111/68, pulse 49, respirations 20, temperature 97.2, pulse  oxygen saturation 96% on room air.   Most recent laboratory tests include a white blood cell count of 5.6,  hemoglobin 10, hematocrit 29, platelets 246,000; serum sodium 139, potassium  3.9, chloride 109, CO2 26, BUN 13, creatinine 1.2, glucose 141; total  cholesterol 155, LDL 73.   Her current exam includes a normal chest with a regular heart rhythm and a  benign abdomen exam.  There is no extremity edema.   DISCHARGE DIAGNOSES:  1.  Chest pain, precordial.  2.  Nonobstructive two-vessel coronary artery disease.  3.  Hypothyroidism.  4.  Hypertension.  5.  Anxiety.  6.  Insomnia.  7.  Leg cramps.  8.  Possible gastroesophageal reflux disease versus gastritis.  9.  Glaucoma.  10. 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  11. Intravenous contrast allergy.   DISCHARGE MEDICATIONS:  1.  Ecotrin 325 mg p.o. daily.  2.  Synthroid 50 mcg p.o. daily.  3.  Ziac 5 mg one tab p.o. daily.  4.  Xanax 0.5 mg p.o. nightly.  5.  Quinine sulfate 325 mg p.o. daily.  6.  Carafate 2 g p.o. b.i.d. 7.  Acular 5% one drop in each eye twice  daily.  8.  Alphagan one drop in each eye once daily.  9.  Xalatan 0.5% one drop in both eyes twice daily.  10. Darvocet-N 100 one tab p.o. b.i.d. p.r.n. pain.  11. Senna-S one to two tabs p.o. daily p.r.n. constipation.  12. Benadryl 25 mg p.o. 2 hours prior to CT scan of  the chest.  13. Prednisone 60 mg daily for the next 4 days prior to the CT scan.  14. Norvasc 5 mg p.o. daily.  15. Premarin 0.625 mg p.o. daily.   ACTIVITY RECOMMENDATIONS:  She should have no driving for the next week.  She was also advised to follow the post-cardiac-catheterization  recommendations.   SPECIAL INSTRUCTIONS:  She was advised to drink enough water to urinate  frequently to clear her IV contrast from her system including after the  planned outpatient CT scan of the chest.   FOLLOWUP PLANS:  She should have a followup visit with Dr. Eloise Harman in 1-2  weeks following discharge and was advised to call [phone] to schedule that  appointment.  She was also advised to schedule a followup appointment with  Dr. Verdis Prime per his recommendations.  We will set up a GI reevaluation  at her followup visit.  We will also order a CT scan of the chest within the  next 24-48 hours to rule out the somewhat unlikely possibility of a thoracic  dissection.      DGP/MEDQ  D:  09/07/2004  T:  09/07/2004  Job:  [id number]   cc:   Griffith Citron, M.D.  [facility]  [place] [address]  Fax: [phone]   Lyn Records III, M.D.  [address]  [address]  [place]  [place] [address]  Fax: [phone]

## 2010-09-15 NOTE — Op Note (Signed)
[place]  Patient:    [name], [name] Visit Number: [id number] MRN: [id number]          Service Type: DSU Location: RCRM 2550 06 Attending Physician:  Arlis Porta Dictated by:   Adolph Pollack, M.D. Proc. Date: 01/16/01 Admit Date:  01/16/2001   CC:         Barry Dienes. Eloise Harman, M.D.   Operative Report  PREOPERATIVE DIAGNOSIS:  Left thyroid nodule.  POSTOPERATIVE DIAGNOSIS:  Left thyroid nodule.  PROCEDURE:  Left thyroid lobectomy with ______.  SURGEON:  Adolph Pollack, M.D.  ASSISTANT:  Anselm Pancoast. Zachery Dakins, M.D.  ANESTHESIA:  General.  INDICATIONS FOR PROCEDURE:  [name] is a 65 year old female who had been having some difficulty with swallowing and noticed a fullness in the left neck area.  Dr. ______ then detected a left thyroid nodule.  She underwent an ultrasound which showed a 12 mm solid nodule with a few small calcifications. This made it a suspicious lesion.  A fine needle aspiration was performed, and this demonstrated findings consistent with nonneoplastic goiter.  We discussed the options for treatment, and she has chose partial thyroidectomy.  She is admitted for that.  DESCRIPTION OF PROCEDURE:  She is placed supine on the operating room table, general anesthetic was administered.  The neck was then extended.  Her neck was sterilely prepped and draped.  A curvilinear transverse incision was made one fingerbreadth above the head of the clavicles, and carried through the skin, subcutaneous tissue, and platysma.  The left anterior jugular muscle was ligated and divided as there was some bleeding from it.  The median rafae between the strap muscles was divided.  The underlying thyroid gland was exposed.  Using careful blunt dissection and a cautery, the left lobe of the thyroid gland was exposed and the nodule was palpable in the inferior aspect posteriorly.  I began superiorly, staying on the  thyroid capsule, I divided the superior thyroid artery and small branches of it.  I subsequently divided the suspensory ligament of Barres, while mobilizing the superior lobe of the thyroid gland.  I identified the superior parathyroid gland at this time.  I stayed on the thyroid capsule and divided small vessels between clips, and preserved the left superior parathyroid gland.  Next, I approached the inferior aspect and divided the left inferior thyroidal artery and vein close to the thyroid on this capsule.  I divided the middle thyroidal vein close to the thyroid on the capsule, and was able to rotate the thyroid to the right.  I exposed the recurrent laryngeal nerve, and traced it to its insertion point in the laryngeal area.  I then began dissecting the left lobe of the thyroid gland free from the trachea using the cautery, and then clipping small vessels.  Again, all the dissection was maintained on the thyroid capsule.  The inferior parathyroid gland was identified and preserved.  I placed a Hemostat across the isthmus and divided this, and I sent the specimen for frozen section analysis.  This was consistent with a benign adenoma at the time of frozen section analysis.  I suture ligated the isthmus.  I then irrigated out the left neck area, and there were small bleeding points which were controlled with cautery.  I once again visualized the two parathyroid glands which were viable, and the recurrent laryngeal nerve which was unharmed.  I briefly palpated the right lobe of the thyroid gland.  It was smooth without  nodules.  Hemostasis was adequate at this time.  I did put a piece of Surgicel in the area of dissection on the left side, then closed the strap muscles with interrupted 3-0 Vicryl sutures.  The platysma was approximated with interrupted 3-0 Vicryl sutures, and the skin was closed with a 4-0 Monocryl subcuticular stitch followed by Steri-Strips and sterile  dressings.  The patient tolerated the procedure well without any apparent complications, and was taken to the recovery room in satisfactory condition. Dictated by:   Adolph Pollack, M.D. Attending Physician:  Arlis Porta DD:  01/16/01 TD:  01/16/01 Job: [id number]

## 2010-09-15 NOTE — H&P (Signed)
[name], [name] NO.:  [id number]   MEDICAL RECORD NO.:  [id number]          PATIENT TYPE:  INP   LOCATION:  2017                         FACILITY:  [facility]   PHYSICIAN:  Barry Dienes. Eloise Harman, M.D.DATE OF BIRTH:  1946/03/27   DATE OF ADMISSION:  09/04/2004  DATE OF DISCHARGE:                                HISTORY & PHYSICAL   CHIEF COMPLAINT:  Chest pain.   HISTORY OF PRESENT ILLNESS:  The patient is a 65 year old black female with  a history of hypertension, Wolff-Parkinson-White syndrome, gestational  diabetes mellitus, and atypical chest pain who has had approximately two  weeks of initially intermittent substernal chest pain.  Last night this  discomfort started at approximately midnight and has continued throughout  the day.  The pain is currently 8/10 intensity and felt as a pressure-like  discomfort in the mid sternal region.  The discomfort radiates to the left  jaw and left arm, and it is associated with mild shortness of breath.  There  is no associated diaphoresis or nausea.  In our office, she was treated with  Zegerid (combination of Prilosec and bicarbonate powder in a drink) which  was not associated with any change in her chest discomfort.  In 2002, she  had atypical chest pain and had a Cardiolite exercise stress test by Dr.  Garnette Scheuermann that reportedly did not show ischemia or cardiac dysfunction.   PAST MEDICAL HISTORY:  1.  Hypertension.  2.  Wolff-Parkinson-White syndrome.  3.  Glaucoma.  4.  Gestational diabetes mellitus.  5.  Left thyroid hyperplastic nodule.  6.  Atypical chest pain with a July 2002 Cardiolite exercise stress test      normal.  7.  Osteoarthritis of the lumbar spine.   MEDICATIONS PRIOR TO ADMISSION:  1.  Ziac 5 mg 1 tablet p.o. daily.  2.  Premarin 1.25 mg p.o. daily.  3.  Xanax 0.5 mg p.o. q.h.s.  4.  Aleve p.r.n. pain.  5.  Alphagan 1 drop in each eye once daily.  6.  Maxzide 25 mg 1/2 tablet p.o. twice  daily.  7.  Levoxyl 50 mcg p.o. daily.  8.  Acular 5% one drop in each eye twice daily.  9.  Quinine sulfate 325 mg once daily.  10. Aspirin 81 mg daily.  11. Motrin 800 mg once daily as needed for arthritis pain.   ALLERGIES:  1.  CODEINE has been associated with nausea.  2.  Durene Fruits has been associated with shortness of breath.   PAST SURGICAL HISTORY:  1.  In 1983, open cholecystectomy.  2.  In 1987, total abdominal hysterectomy.  3.  In 1992, bladder suspension.  4.  In 1994, chalazion resection.  5.  In 1994, breast biopsy.  6.  In 1996, foot operation.  7.  In 1997, carpal tunnel syndrome release.  8.  In 1999, right rotator cuff surgery x 3.  9.  In 2002, thyroid biopsy.  10. In 2003, cataract operations bilaterally.  11. May 2005, laser operations on both eyes for glaucoma.   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  She has a tobacco history of 15 pack years of  smoking that was stopped in 1978 and no history of alcohol abuse.   FAMILY HISTORY:  Her father is in his 55s and has hypertension and glaucoma.  Her mother died of complications of diabetes mellitus.  She has four brother  and two sisters that are in good health.  She has one son and three  daughters.  One daughter has hypertension.  Family history is notable for  close relatives with diabetes mellitus but none with premature coronary  artery disease, colon cancer, or breast cancer.   REVIEW OF SYSTEMS:  She has had a dry cough lately with mild rhinitis and a  mild headache.  She has not had recent fever or palpitations, constipation,  diarrhea, gastroesophageal reflux disease symptoms, osteoarthritis pain,  anxiety, or depression.   PHYSICAL EXAMINATION:  VITAL SIGNS:  Blood pressure 120/80, pulse 60,  respirations 20, pulse oxygen saturation 100% on room air.  GENERAL:  Mildly overweight black female who had a dry cough but no  shortness of breath.   HEENT:  Significant for mild rhinitis.  NECK:  Supple without jugular venous distention or carotid bruit.  CHEST:  Clear to auscultation.  HEART:  Regular rate and rhythm without murmur or gallop.  ABDOMEN:  Normal bowel sounds with no hepatosplenomegaly or tenderness.  EXTREMITIES:  Without cyanosis, clubbing, or edema.  NEUROLOGIC:  Exam was nonfocal.   LABORATORY STUDIES:  EKG showed:  1.  Normal sinus rhythm.  2.  Nonspecific ST segment and T wave abnormalities.  3.  No significant interval change versus October 29, 2000.   IMPRESSION AND PLAN:  1.  Chest pain:  This is of uncertain cause.  There are some typical      features for coronary artery disease, and she has not responded to      empiric treatment for gastritis or gastroesophageal reflux.  I doubt      that she has a primary pulmonary etiology to her discomfort.  I plan to      admit her for an expeditious cardiology evaluation.  She will be started      on heparin IV with low-dose nitrates and beta blocker treatment.  2.  Hypothyroidism:  She is clinically euthroid on her current dose of      thyroid replacement hormone.  3.  Hypertension:  Well controlled on her current medical regimen.  4.  History of gestation diabetes mellitus:  Again, this has been well      controlled, and she has not shown evidence of development of diabetes      mellitus, type 2.      DGP/MEDQ  D:  09/04/2004  T:  09/04/2004  Job:  [id number]   cc:   Lesleigh Noe, M.D.  [address]  [address]  [place]  [place] [address]  Fax: [phone]   Griffith Citron, M.D.  [facility] [place]  [place] [address]  Fax: [phone]   Miguel Aschoff, M.D.  [address]  [place]  [place] [address]  Fax: [phone]

## 2010-09-15 NOTE — Op Note (Signed)
[place]  Patient:    [name], [name]                MRN: [id number] Proc. Date: 06/23/99 Adm. Date:  [id number] Attending:  Ronne Binning CC:         Nicki Reaper, M.D. (2 copies)                           Operative Report  PREOPERATIVE DIAGNOSIS:  Degenerative arthritis, interphalangeal joint, ______ mucoid cyst, left thumb.  POSTOPERATIVE DIAGNOSIS: Degenerative arthritis, interphalangeal joint, ______  mucoid cyst, left thumb.  OPERATION:  Debridement interphalangeal joint, left thumb.  SURGEON:  Nicki Reaper, M.D.  ASSISTANT:  Joaquin Courts, R.N.  ANESTHESIA:  Forearm-based IV regional.  ANESTHESIOLOGIST:  Halford Decamp, M.D.  INDICATIONS:  The patient is a 65 year old female with a history of a enlarged thumb with a cyst on the radial aspect.  DESCRIPTION OF PROCEDURE:  The patient was brought to the operating room where he was prepared and draped using Betadine scrub and solution with the left arm free in the supine position.  A IV regional anesthesia, forearm-based was given.  A curvilinear incision was made, carried down through subcutaneous tissue. Bleeders were electrocauterized.  The dissection carried down to the joint.  There was a  large expansion, but not a typical cyst formation.  This area was opened, debrided. A large exostosis was present within the entire dorsal aspect and proximal phalanx.  With blunt and sharp dissection, a rongeur was used to remove this smoothing this down removing both radial and ulnar masses.  The wound was irrigated.  The extensor tendon was left intact.  Incision was made down the radial side and the completion of the debridement on the radial aspect and ulnar aspect were completed.  The wound was copiously irrigated with saline.  The skin was closed with interrupted 5-0 nylon sutures.  Sterile compressive dressing and splints to the thumb applied. The patient  tolerated the procedure well and was taken to the recovery room for observation in satisfactory condition.  She is discharged home to return to the  [facility] [place] in one week on Vicodin and Keflex. DD:  06/23/99 TD:  06/23/99 Job: [id number]

## 2010-09-15 NOTE — Op Note (Signed)
NAMEELEASE, SWARM NO.:  [id number]   MEDICAL RECORD NO.:  [id number]          PATIENT TYPE:  INP   LOCATION:  2899                         FACILITY:  [facility]   PHYSICIAN:  Hewitt Shorts, M.D.DATE OF BIRTH:  11/10/1945   DATE OF PROCEDURE:  02/23/2004  DATE OF DISCHARGE:                                 OPERATIVE REPORT   PREOPERATIVE DIAGNOSIS:  Left L5-S1 foraminal synovial cyst, bilateral L5-S1  facet arthropathy, left L5 lumbar radiculopathy.   POSTOPERATIVE DIAGNOSIS:  Left L5-S1 foraminal synovial cyst, bilateral L5-  S1 facet arthropathy, left L5 lumbar radiculopathy.   PROCEDURE:  L5 Gill procedure, microsurgical resection of left L5-S1  foraminal synovial cyst, bilateral L5-S1 posterolateral arthrodesis with 90D  posterior instrumentation, and Vitoss with Infuse.   SURGEON:  Hewitt Shorts, M.D.   ASSISTANT:  Cristi Loron, M.D.   ANESTHESIA:  General endotracheal anesthesia.   INDICATIONS FOR PROCEDURE:  This is a 65 year old woman who presented with  L5 lumbar radiculopathy with prominent weakness of the left dorsiflexor.  She was found by MRI to have advanced facet arthropathy at L5-S1, left worse  than right with an associated left L5-S1 foraminal synovial cyst.  The  decision was made to proceed with decompression and arthrodesis.   DESCRIPTION OF PROCEDURE:  The patient was brought to the operating room and  placed under general endotracheal anesthesia.  The patient was turned to the  prone position and the lumbar region was prepped with Betadine soaping  solution and draped in a sterile fashion.  The midline was infiltrated with  local anesthetic with epinephrine.  An x-ray was taken and the L5-S1 level  identified and a midline incision was made over the L5-S1 level and  continued down through the subcutaneous tissue.  Bipolar cautery and  electrocautery were used to maintain hemostasis.  Dissection was carried  down to the  lumbar fascia which was incised bilaterally.  The paraspinal  muscles were dissected from the spinous process and lamina in a  subperiosteal fashion.  The self-retaining retractor was placed and another  x-ray was taken and the L5-S1 interlaminar space identified.  Dissection was  carried out laterally over the facet complexes at L5-S1.  There was marked  facet arthropathy noted bilaterally although it was worse on the left than  the right side.  We initiated bilateral facetectomy using the X-Max drill,  double action rongeurs, and Kerrison punches.  This allowed [place] to better  expose the ala of S1 and the transverse process of L5.  The microscope was  then draped and brought onto the field to provide additional magnification,  illumination, and visualization.  Bilateral L5-S1 laminotomy was performed,  although more extensively on the left side and, in fact, the left L5-S1  facetectomy was more extensive than that on the right.  We were able to  remove the ligamentum flavum and identify the lateral margin of the thecal  sac.  We identified the exiting left L5-S1 nerve roots and as the exposure  was continued, we identified a large synovial cyst emanating from the  superior  aspect of the left L5-S1 facet joint compressing the left L5 nerve  root.  The cyst was located superolateral to the nerve root, itself.  The  cyst was carefully dissected using microdissection and microsurgical  technique, mobilized, and removed, and the nerve root decompressed.  Once  the nerve root was decompressed, we identified the pedicles bilaterally at  L5 and S1 and then proceeded with the posterolateral arthrodesis.  We  identified the pedicle entry sites using fluoroscopic guidance and direct  visualization.  The overlying cortex was opened and using pedicle probes and  fluoroscopy, we were able to probe down through the pedicles into the  vertebral body bilaterally at each level.  These were examined with a  ball  probe.  No cut outs were noted and then each pedicle was tapped with a 5.25  tap.  At L5, we placed 6.75 by 40 mm screws and at S1, we placed 6.75 by 30  mm screws.  After each pedicle was tapped, it was again examined with a ball  probe prior to placing the screws and, again, no cut outs were noted.  Once  all four screws were in place, we selected a 40 mm rod which was gently  contoured to the lumbosacral lordosis and positioned in the screw heads and  secured with locking caps.  Final tightening against the counter torque was  performed for each screw.  The transverse process in the ala had been  decorticated earlier with the X-Max drill and then Vitoss along with Infuse,  which was wrapped around the Vitoss was packed in the lateral gutters over  the transverse process and ala bilaterally and over the decorticated facet  complex on the right side where the bony decompression had been more  limited.  The wound was then closed in multiple layers.  The deep fascia was  closed with interrupted undyed #1 Vicryl suture, the subcutaneous and  subcuticular layer was closed with interrupted inverted 2-0 undyed Vicryl  sutures, and the skin was reapproximated with Dermabond.  The patient  tolerated the procedure well.  Estimated blood loss was 200 mL.  We did use  the Cell Saver during the procedure, but the Cell Saver technician felt  there was insufficient blood loss to spin down the collected blood.  Sponge  and needle counts were correct.  Following surgery, the patient was turned  back to the supine position, reversed from the anesthetic, extubated, and  transferred to the recovery room for further care where she was noted to be  moving all four extremities to command.       RWN/MEDQ  D:  02/23/2004  T:  02/23/2004  Job:  [id number]

## 2010-09-15 NOTE — Cardiovascular Report (Signed)
NAMEMarland Kitchen  KYRI, SHADER NO.:  [id number]   MEDICAL RECORD NO.:  [id number]          PATIENT TYPE:  INP   LOCATION:  2017                         FACILITY:  [facility]   PHYSICIAN:  Lyn Records III, M.D.DATE OF BIRTH:  Aug 18, 1945   DATE OF PROCEDURE:  09/06/2004  DATE OF DISCHARGE:                              CARDIAC CATHETERIZATION   INDICATIONS:  Prolonged chest, neck and jaw discomfort in this 65 year old  female with a strong family history of premature atherosclerosis. She  recently had a stress Cardiolite study that demonstrated global decreased LV  function, EF of 41% with no perfusion abnormality. In the face of the  recurring ongoing chest discomfort, the catheterization was felt to be  indicated to rule out significant high-grade triple-vessel disease with  matched myocardial perfusion deficit in all regions.   PROCEDURE PERFORMED:  1.  Left heart catheterization.  2.  Selective coronary angiography.  3.  Left ventriculography.  4.  AngioSeal arteriotomy closure.   DESCRIPTION:  After informed consent, 6-French sheath was placed in the  right femoral artery using modified Seldinger technique. A 6-French A2  multipurpose catheter was then used for hemodynamic recordings, left  ventriculography by hand injection, and selective left and right coronary  angiography. The patient tolerated the procedure without complications.  AngioSeal arteriotomy closure was performed with good hemostasis. No  complications occurred.   RESULTS:   HEMODYNAMIC DATA:  1.  Aortic pressure 128/73.  2.  Left ventricular pressure was 130/9.   LEFT VENTRICULOGRAPHY:  Left ventricle is normal in size and demonstrates  normal overall contractility. EF is greater than 60%. No mitral  regurgitation is noted.   CORONARY ANGIOGRAPHY:  1.  Left main coronary: The left main coronary artery is widely patent.  2.  Left anterior descending coronary: The LAD is a large vessel that  wraps      around left ventricular apex. There is eccentric 40-50% narrowing in the      midvessel. The LAD gives origin to a large diagonal. It is also widely      patent and free of any significant obstruction.  3.  Circumflex artery: Circumflex artery is large and bifurcates on the left      lateral wall. It contains an acute angulation in the proximal segment.      This region is difficult to evaluate but in almost all projections when      the vessel straightens out there is no significant obstruction with      perhaps 30% to no more than 50% narrowing. When there is motion and      there is kinking, this region appears to be perhaps more significantly      narrowed.  4.  Right coronary: The right coronary is dominant, gives a large PDA and      left ventricular branches. There is proximal tortuosity. No obstruction      is noted in the right coronary.   CONCLUSION:  1.  Up to 50% proximal circumflex narrowing with an acute angulation and 40-      50% mid left anterior  descending artery stenosis. No hemodynamically      significant obstructions are felt to be present.  2.  Normal left ventricular function with normal left ventricular pressures.  3.  Recent decreased left ventricular function by gated Cardiolite suggests      that the Cardiolite information is in error number.  4.  The patient's chest discomfort the prolonged nature, the negative      cardiac enzymes and nonobstructive coronary disease, all suggestive this      chest discomfort is noncardiac. I think the most likely source would be      esophageal disease and/or reflux with esophageal spasm.   PLAN:  Consider GI origin. Intensify antireflux therapy. Decreased anti-  inflammatory therapy. Consider a CT scan of the chest with contrast rule out  aortic pathology and pulmonary emboli.       HWS/MEDQ  D:  09/06/2004  T:  09/06/2004  Job:  [id number]   cc:   Barry Dienes. Eloise Harman, M.D.  [address]  [place]  [place]  [address]  Fax: [phone]

## 2010-09-15 NOTE — H&P (Signed)
[name], DYCHES NO.:  [id number]   MEDICAL RECORD NO.:  [id number]          PATIENT TYPE:  INP   LOCATION:  2899                         FACILITY:  [facility]   PHYSICIAN:  Hewitt Shorts, M.D.DATE OF BIRTH:  1945-10-24   DATE OF ADMISSION:  02/23/2004  DATE OF DISCHARGE:                                HISTORY & PHYSICAL   HISTORY OF PRESENT ILLNESS:  The patient is a 65 year old right-handed black  female who has presented with left lower extremity radicular pain.  Initially, she was having discomfort in the lateral aspect of the left hip.  She used ibuprofen and Aleve without relief.  Subsequently, the pain  extended into the lateral aspect of her left thigh, into the anterior aspect  of her left leg with numbness in the left lower extremity as well.  She has  found it difficult to find comfortable positions to sit or lay.  Her work  activities have tended to aggravate her discomfort.  She has a sense of  weakness in the left lower extremity.  She has difficulty going up and down  stairs and has difficulty initiating walking after getting up from a seated  position.  She denies any low back pain and denies any buttock pain.   The patient is evaluated with MRI and x-rays of the lumbar spine.  They show  evidence of degenerative disk disease L4-5 and L5-S1 with significant L5-S1  facet arthropathy, left worse than right with left L5-S1 neural foraminal  encroachment which is felt to be due to a combination of facet arthropathy  and protruding synovium from the facet joint into the neural foramen  compressing the exiting left L5 nerve roots.  Various options for treatment  have been discussed and the patient is admitted now for decompression and  arthrodesis.   PAST MEDICAL HISTORY:  Notable for a history of hypertension, Wolf-Parkinson-  White syndrome and glaucoma.  Her cardiologist is Dr. Garnette Scheuermann.  Her  primary physician is Dr. Brunilda Payor, her  urologist is Dr. Alexis Frock,  her ophthalmologist is Dr. Sol Blazing.  She does not describe any history of  myocardial infarction, cancer, stroke, diabetes, peptic ulcer disease, or  lung disease.   PREVIOUS SURGERY:  Includes cesarean sections in 1965 and 1969 and 1971,  cholecystectomy in 1982, hysterectomy and bilateral salpingo-oophorectomy in  1986, RAZ procedure in 1993, breast biopsy in 1996, rotator cuff surgery x3  in 1999, ulnar nerve and carpal tunnel surgery in 1995, foot surgery in  1998, thyroidectomy in 2001, bilateral cataract surgery in 2004, bilateral  ocular laser surgery in 2005.   ALLERGIES:  She reports allergies to Talwin which causes her to faint, IVP  contrast which causes nausea and vomiting, hypertension and headache, Tylox  which causes nausea and vomiting, headache and blurred vision.   CURRENT MEDICATIONS:  1.  Premarin 1.25 mg daily for hormone-replacement therapy.  2.  Ziac 6.25 mg daily for hypertension.  3.  Maxzide half a tablet daily for fluid retention.  4.  Xanax 0.5 mg b.i.d. for anxiety.  5.  Ibuprofen 800  mg b.i.d. for pain.  6.  Aleve two tablets q.12h. for pain.  7.  Levoxyl 50 mcg daily for thyroid replacement.  8.  Alphagan 15% drops, two drops b.i.d.   FAMILY HISTORY:  Her parents are passed on.  Her mother died at age 43.  She  had a stroke.  Her father died at age 30 of what was apparently a heart  attack.  He also had glaucoma.   SOCIAL HISTORY:  The patient is divorced.  She works as a Network engineer at [employer].  She does not smoke.  She does drink  alcohol but it is socially.  She denies a history of substance abuse.   REVIEW OF SYSTEMS:  Notable for what is described in the history of present  illness and past medical history, otherwise unremarkable.   PHYSICAL EXAMINATION:  GENERAL:  The patient is a well-developed, well-  nourished, black female in no acute distress although she has significant   discomfort on changing positions and with her mobility, has relative  difficulty.  VITAL SIGNS:  Temperature is 97.2, pulse 89, blood pressure 130/83,  respiratory rate 16, height 5 feet 3 inches, 230 pounds.  LUNGS:  Clear to auscultation.  She has symmetrical respiratory excursion.  HEART:  A regular rate and rhythm with an S1 and S2.  There is no murmur.  ABDOMEN:  Soft, nondistended.  Bowel sounds present.  EXTREMITIES:  Examination shows no cyanosis, clubbing or edema.  MUSCULOSKELETAL:  There is no tenderness to palpation of the lumbar spinous  process or parallel musculature.  She is able to flex 90 degrees but has  discomfort with flexion extending into the left lower extremity.  She is  able to extend without discomfort.  Straight leg raising is negative  bilaterally.  NEUROLOGICAL:  Examination shows 5/5 strength in the distal right lower  extremity including dorsiflexion, plantar flexion, extensor hallicis longus  however, in the distal left lower extremity shows the dorsiflexion and  extensor hallicis longus to be 4- to 4/5.  The plantar flexion is 4 to 4+/5.  Sensation:  Decreased pin prick in the distal left lower extremity as  compared to the distal right lower extremity, particularly in the medial  aspect of the left foot.  Reflexes are 1-2 at the quadriceps and  gastrocnemius, symmetrical bilaterally.  Toes are downgoing bilaterally.  She has difficulty with her stance due to pain and favors her left lower  extremity.  She carries the left lower extremity when walking as well and  circumducts the left lower extremity as she walks.   IMPRESSION:  The patient with spondylosis and degenerative disk disease with  left L5-S1 neural foraminal encroachment and resulting lumbar radiculopathy.   PLAN:  The patient will be admitted for bilateral L5 GILL procedure with  decompressive canal and neural foramen and bilateral L5-S1 posterolateral arthrodesis with posterior  instrumentation.  I have discussed the nature of  the procedure, alternative surgical procedures, alternatives to surgery and  principles of the surgery, how soon and long the recuperation, limitations  postoperatively, the need for postoperative immobilization and lumbar  corset.  We plan to use the Cell Saver drain during the surgery and the  risks surgically, the risks of infection, bleeding, possibly needing  transfusion, the risk of nerve dysfunction and pain, weakness in limbs or  paresthesias.  The risk of dural tear and CSF leak necessitating the  possible need for further surgery, the risk of failure, arthrodesis and  anesthetic risks of myocardial infarction, stroke, and even death.  After  discussing all this, she wishes to proceed with surgery, is admitted for  such.       RWN/MEDQ  D:  02/23/2004  T:  02/23/2004  Job:  [id number]

## 2010-09-22 ENCOUNTER — Encounter: Payer: 59 | Attending: Surgery | Admitting: Dietician

## 2010-09-22 DIAGNOSIS — I10 Essential (primary) hypertension: Secondary | ICD-10-CM | POA: Insufficient documentation

## 2010-09-22 DIAGNOSIS — Z713 Dietary counseling and surveillance: Secondary | ICD-10-CM | POA: Insufficient documentation

## 2010-11-10 ENCOUNTER — Encounter: Payer: Medicare Other | Attending: Surgery | Admitting: Dietician

## 2010-11-10 ENCOUNTER — Encounter: Payer: Self-pay | Admitting: Dietician

## 2010-11-10 DIAGNOSIS — I10 Essential (primary) hypertension: Secondary | ICD-10-CM | POA: Insufficient documentation

## 2010-11-10 DIAGNOSIS — Z713 Dietary counseling and surveillance: Secondary | ICD-10-CM | POA: Insufficient documentation

## 2010-11-10 NOTE — Patient Instructions (Addendum)
-   Increase calorie intake to 1000-1200 calories each day -Keep a food recall for 1-2 days and weekend day for calorie level -Increase the intake of potassium based foods. -Look to increase the intake of calories at lunch and dinner. -Use fruit rather than the fruit juice. -Look to increase the protein in your diet. Try to use those meats/proteins that  Are more gout friendly. -Continue to participate in water aerobics.  Handouts:Potassium rich fruits and vegetables       Food Recall Record for recording calorie intake

## 2010-11-10 NOTE — Progress Notes (Signed)
Medical Nutrition Therapy:  Appt start time: 0800 end time:  0900.  MEDICATIONS: Metformin, 500 mg after dinner, HCTZ 12.5 mg daily, Sironolactone 25 mg daily, Atenolol daily.  Assessment:  Primary concerns today: Blood sugar control, weigh control.  24-hr recall: B ( AM)-8:30-9:00 1 cup of Honey Nut Cherrioes, Milk 2% 1/2 cup, Banana 1 large ; Snk : AM trail mix (handful)  Or an apple or yogurt ; L: string cheese and crackers, pepperoni ([place]) if out will order a vegetable plate, or watermelon, ; D ( PM)- 5:00-6:00pm small salad with [place], no dressing.; Snk ( PM) 9:00pm Bing Cherries and 1 string cheese. Rough estimate or calories per day = 850 calories C/o muscle cramping on some days.  Maybe having issues with low potassium since she is on HCTZ and no potassium supplementation.   Recent physical activity includes : Water Aerobics 3 days per week for 60 minutes. .  Progress Towards Goal(s):  In progress.   Nutritional Diagnosis:  [place]-2.2 Altered nutrition-related laboratory As related to glucose.  As evidenced by HgA1C of 7.0% and diagnosis of diabetes.    Intervention:  Nutrition Needs to increase calories to [id number]/day. Review of nutrient needs and potassium foods and methods to increase the intake Monitoring/Evaluation:  Dietary intake, exercise, blood glucose status, and body weight prn .

## 2010-12-27 ENCOUNTER — Ambulatory Visit: Payer: Commercial Managed Care - PPO | Admitting: Dietician

## 2011-01-25 LAB — CBC
HCT: 34.3 — ABNORMAL LOW
Hemoglobin: 11.5 — ABNORMAL LOW
MCHC: 33.5
MCV: 83.3
Platelets: 258
RBC: 4.12
RDW: 13.2
WBC: 6.8

## 2011-01-25 LAB — BASIC METABOLIC PANEL
BUN: 15
CO2: 25
CO2: 27
Calcium: 9.3
Chloride: 101
Creatinine, Ser: 0.9
GFR calc Af Amer: >60
GFR calc non Af Amer: >60
GFR calc non Af Amer: 52 — ABNORMAL LOW
Glucose, Bld: 142 — ABNORMAL HIGH
Glucose, Bld: 87
Potassium: 4
Potassium: 4.6
Sodium: 135
Sodium: 137

## 2011-01-25 LAB — DIFFERENTIAL
Basophils Absolute: 0
Basophils Relative: 0
Eosinophils Absolute: 0
Eosinophils Relative: 0
Lymphocytes Relative: 18
Lymphs Abs: 1.2
Monocytes Absolute: 0.3
Monocytes Relative: 5
Neutro Abs: 5.2
Neutrophils Relative %: 77

## 2011-05-22 ENCOUNTER — Emergency Department (HOSPITAL_COMMUNITY)
Admission: EM | Admit: 2011-05-22 | Discharge: 2011-05-22 | Disposition: A | Discharge status: Home / Self Care | Payer: Medicare Other | Attending: Emergency Medicine | Admitting: Emergency Medicine

## 2011-05-22 ENCOUNTER — Emergency Department (HOSPITAL_COMMUNITY): Payer: Medicare Other

## 2011-05-22 ENCOUNTER — Encounter (HOSPITAL_COMMUNITY): Payer: Self-pay | Admitting: Emergency Medicine

## 2011-05-22 ENCOUNTER — Other Ambulatory Visit: Payer: Self-pay

## 2011-05-22 DIAGNOSIS — R0602 Shortness of breath: Secondary | ICD-10-CM | POA: Insufficient documentation

## 2011-05-22 DIAGNOSIS — E119 Type 2 diabetes mellitus without complications: Secondary | ICD-10-CM | POA: Insufficient documentation

## 2011-05-22 DIAGNOSIS — R059 Cough, unspecified: Secondary | ICD-10-CM | POA: Insufficient documentation

## 2011-05-22 DIAGNOSIS — I2510 Atherosclerotic heart disease of native coronary artery without angina pectoris: Secondary | ICD-10-CM | POA: Insufficient documentation

## 2011-05-22 DIAGNOSIS — H409 Unspecified glaucoma: Secondary | ICD-10-CM | POA: Insufficient documentation

## 2011-05-22 DIAGNOSIS — I456 Pre-excitation syndrome: Secondary | ICD-10-CM | POA: Insufficient documentation

## 2011-05-22 DIAGNOSIS — R05 Cough: Secondary | ICD-10-CM | POA: Insufficient documentation

## 2011-05-22 DIAGNOSIS — R0989 Other specified symptoms and signs involving the circulatory and respiratory systems: Secondary | ICD-10-CM | POA: Insufficient documentation

## 2011-05-22 DIAGNOSIS — I10 Essential (primary) hypertension: Secondary | ICD-10-CM | POA: Insufficient documentation

## 2011-05-22 DIAGNOSIS — M129 Arthropathy, unspecified: Secondary | ICD-10-CM | POA: Insufficient documentation

## 2011-05-22 DIAGNOSIS — Z794 Long term (current) use of insulin: Secondary | ICD-10-CM | POA: Insufficient documentation

## 2011-05-22 DIAGNOSIS — R079 Chest pain, unspecified: Secondary | ICD-10-CM | POA: Insufficient documentation

## 2011-05-22 DIAGNOSIS — Z79899 Other long term (current) drug therapy: Secondary | ICD-10-CM | POA: Insufficient documentation

## 2011-05-22 DIAGNOSIS — R0609 Other forms of dyspnea: Secondary | ICD-10-CM | POA: Insufficient documentation

## 2011-05-22 HISTORY — DX: Unspecified osteoarthritis, unspecified site: M19.90

## 2011-05-22 HISTORY — DX: Pre-excitation syndrome: I45.6

## 2011-05-22 HISTORY — DX: Essential (primary) hypertension: I10

## 2011-05-22 LAB — COMPREHENSIVE METABOLIC PANEL
ALT: 18 U/L (ref 0–35)
Alkaline Phosphatase: 115 U/L (ref 39–117)
CO2: 27 mEq/L (ref 19–32)
Calcium: 10.2 mg/dL (ref 8.4–10.5)
GFR calc Af Amer: 45 mL/min — ABNORMAL LOW (ref >90)
GFR calc non Af Amer: 39 mL/min — ABNORMAL LOW (ref >90)
Glucose, Bld: 104 mg/dL — ABNORMAL HIGH (ref 70–99)
Potassium: 3.7 mEq/L (ref 3.5–5.1)
Sodium: 138 mEq/L (ref 135–145)
Total Bilirubin: 0.2 mg/dL — ABNORMAL LOW (ref 0.3–1.2)

## 2011-05-22 LAB — CBC
Hemoglobin: 11.1 g/dL — ABNORMAL LOW (ref 12.0–15.0)
MCH: 26.9 pg (ref 26.0–34.0)
RBC: 4.12 MIL/uL (ref 3.87–5.11)

## 2011-05-22 NOTE — ED Notes (Signed)
Patient resting with NAD at this time. Patient placed on monitor and family is at bedside.

## 2011-05-22 NOTE — ED Provider Notes (Signed)
History     CSN: [id number]  Arrival date & time 05/22/11  1106   First MD Initiated Contact with Patient 05/22/11 1315      Chief Complaint  Patient presents with  . Chest Pain    (Consider location/radiation/quality/duration/timing/severity/associated sxs/prior treatment) HPI The patient presents with 3 days of left sided chest pain.  The pain is focally about the superior left breast.  The pain is described as sharp, intermittently , more uncomfortable, without clear exacerbating or alleviating factors.  The pain is otherwise nonradiating.  The patient has not achieved relief with her home medications. She also complains of mild cough, mild dyspnea. No nausea, no vomiting, no diarrhea, no confusion.  No lower extremity Past Medical History  Diagnosis Date  . Diabetes mellitus   . Glaucoma   . Coronary artery disease   . Wolf-Parkinson-White syndrome   . Thyroid disease   . Arthritis   . Hypertension     Past Surgical History  Procedure Date  . Thyroid surgery   . Cholecystectomy   . Back surgery   . Breast surgery   . Eye surgery   . Abdominal hysterectomy   . Skin biopsy     No family history on file.  History  Substance Use Topics  . Smoking status: Never Smoker   . Smokeless tobacco: Not on file  . Alcohol Use: No    OB History    Grav Para Term Preterm Abortions TAB SAB Ect Mult Living                  Review of Systems  Constitutional:       HPI  HENT:       HPI otherwise negative  Eyes: Negative.   Respiratory:       HPI, otherwise negative  Cardiovascular:       HPI, otherwise nmegative  Gastrointestinal: Negative for vomiting.  Genitourinary:       HPI, otherwise negative  Musculoskeletal:       HPI, otherwise negative  Skin: Negative.   Neurological: Negative for syncope.    Allergies  Allopurinol; Codeine; Colchicine; Esomeprazole magnesium; Hydrocodone; Meperidine hcl; and Pentazocine lactate  Home Medications   Current  Outpatient Rx  Name Route Sig Dispense Refill  . ALPRAZOLAM 0.5 MG PO TABS Oral Take 0.5 mg by mouth at bedtime.    . ATENOLOL 25 MG PO TABS Oral Take 25 mg by mouth daily.    Marland Kitchen EXENATIDE 10 MCG/0.04ML [place] SOLN Subcutaneous Inject 10 mcg into the skin 2 (two) times daily before a meal.    . FEBUXOSTAT 40 MG PO TABS Oral Take 80 mg by mouth daily.    Marland Kitchen HYDROCHLOROTHIAZIDE 12.5 MG PO CAPS Oral Take 12.5 mg by mouth daily.      Marland Kitchen LATANOPROST 0.005 % OP SOLN Both Eyes Place 1 drop into both eyes at bedtime.    Marland Kitchen LEVOTHYROXINE SODIUM 50 MCG PO TABS Oral Take 50 mcg by mouth daily.    Marland Kitchen METFORMIN HCL 500 MG PO TABS Oral Take 500 mg by mouth daily after supper.     Marland Kitchen ALIGN 4 MG PO CAPS Oral Take 4 mg by mouth daily.    Marland Kitchen SPIRONOLACTONE 50 MG PO TABS Oral Take 25 mg by mouth daily.        BP 115/60  Pulse 71  Temp 98.3 F (36.8 C)  SpO2 97%  Physical Exam  Nursing note and vitals reviewed. Constitutional: She is oriented to  person, place, and time. She appears well-developed and well-nourished. No distress.  HENT:  Head: Normocephalic and atraumatic.  Eyes: Conjunctivae and EOM are normal.  Cardiovascular: Normal rate and regular rhythm.   Pulmonary/Chest: Effort normal and breath sounds normal. No stridor. No respiratory distress.  Abdominal: She exhibits no distension.  Musculoskeletal: She exhibits no edema.  Neurological: She is alert and oriented to person, place, and time. No cranial nerve deficit.  Skin: Skin is warm and dry.  Psychiatric: She has a normal mood and affect.    ED Course  Procedures (including critical care time)  Labs Reviewed  CBC - Abnormal; Notable for the following:    Hemoglobin 11.1 (*)    HCT 34.1 (*)    All other components within normal limits  COMPREHENSIVE METABOLIC PANEL - Abnormal; Notable for the following:    Glucose, Bld 104 (*)    BUN 27 (*)    Creatinine, Ser 1.40 (*)    Total Protein 8.5 (*)    Total Bilirubin 0.2 (*)    GFR calc non  Af Amer 39 (*)    GFR calc Af Amer 45 (*)    All other components within normal limits  POCT I-STAT TROPONIN I  I-STAT TROPONIN I  D-DIMER, QUANTITATIVE   Dg Chest 2 View  05/22/2011  *RADIOLOGY REPORT*  Clinical Data: The left chest pain.  Shortness of breath.  CHEST - 2 VIEW  Comparison: Multiple exams, including 08/16/2008  Findings: Mild tortuosity of the thoracic aorta is present.  Heart size is within normal limits.  Thoracic spondylosis noted.  Low lung volumes are present, causing crowding of the pulmonary vasculature.  No pleural effusion is observed.  IMPRESSION:  1.  Thoracic spondylosis. 2.  Tortuous thoracic aorta.  Original Report Authenticated By: Dellia Cloud, M.D.     No diagnosis found.   Date: 05/22/2011  Rate: 83  Rhythm: normal sinus rhythm  QRS Axis: normal  Intervals: normal  ST/T Wave abnormalities: normal  Conduction Disutrbances:none  Narrative Interpretation:   Old EKG Reviewed: none available and unchanged NORMAL  CXR reviewed by me   MDM  65 year old female with insulin-dependent diabetes, now presents with several days of left sided chest pain.  On exam, the patient is in no distress, with no hypoxia.  The patient's story is minimally concerning for pulmonary embolism, less so, for cardiac etiology, given the absence of exertional factors.  The patient's ECG and CXR are reassuring.   Her labs are notable for negative dimer and worsening renal function.  She was d/c in stable condition to f/u with her PMD.        Gerhard Munch, MD 05/22/11 1437

## 2011-05-22 NOTE — ED Notes (Signed)
Cp started 3-4 days ago some nausea  And sob

## 2011-06-07 ENCOUNTER — Other Ambulatory Visit: Payer: Self-pay | Admitting: Obstetrics and Gynecology

## 2011-06-07 DIAGNOSIS — Z1231 Encounter for screening mammogram for malignant neoplasm of breast: Secondary | ICD-10-CM

## 2011-07-30 ENCOUNTER — Ambulatory Visit
Admission: RE | Admit: 2011-07-30 | Discharge: 2011-07-30 | Disposition: A | Discharge status: Home / Self Care | Payer: Medicare Other | Source: Ambulatory Visit | Attending: Obstetrics and Gynecology | Admitting: Obstetrics and Gynecology

## 2011-07-30 DIAGNOSIS — Z1231 Encounter for screening mammogram for malignant neoplasm of breast: Secondary | ICD-10-CM

## 2012-04-03 ENCOUNTER — Other Ambulatory Visit: Payer: Self-pay | Admitting: Orthopedic Surgery

## 2012-04-03 DIAGNOSIS — M542 Cervicalgia: Secondary | ICD-10-CM

## 2012-04-03 DIAGNOSIS — M25511 Pain in right shoulder: Secondary | ICD-10-CM

## 2012-04-10 ENCOUNTER — Ambulatory Visit
Admission: RE | Admit: 2012-04-10 | Discharge: 2012-04-10 | Disposition: A | Discharge status: Home / Self Care | Payer: Medicare Other | Source: Ambulatory Visit | Attending: Orthopedic Surgery | Admitting: Orthopedic Surgery

## 2012-04-10 DIAGNOSIS — M542 Cervicalgia: Secondary | ICD-10-CM

## 2012-04-11 ENCOUNTER — Ambulatory Visit
Admission: RE | Admit: 2012-04-11 | Discharge: 2012-04-11 | Disposition: A | Discharge status: Home / Self Care | Payer: Medicare Other | Source: Ambulatory Visit | Attending: Orthopedic Surgery | Admitting: Orthopedic Surgery

## 2012-04-11 DIAGNOSIS — M25511 Pain in right shoulder: Secondary | ICD-10-CM

## 2012-06-03 ENCOUNTER — Encounter (HOSPITAL_BASED_OUTPATIENT_CLINIC_OR_DEPARTMENT_OTHER): Payer: Self-pay | Admitting: *Deleted

## 2012-06-03 NOTE — Progress Notes (Signed)
Pt retired Engineer, civil (consulting) short stay-has had WPW for yrs, diabetic, htn,cad-sees dr Hansel Feinstein been having more irreg HR-dr Katrinka Blazing cleared her for surgery, but wants to do a halter monitor post op. Will get notes to see if he wants telemetry post op. Pt will come in for bmet-if all ok-to bring all meds and overnight bag in case she needs to stay

## 2012-06-05 ENCOUNTER — Encounter (HOSPITAL_BASED_OUTPATIENT_CLINIC_OR_DEPARTMENT_OTHER)
Admission: RE | Admit: 2012-06-05 | Discharge: 2012-06-05 | Disposition: A | Discharge status: Home / Self Care | Payer: Medicare Other | Source: Ambulatory Visit | Attending: Orthopedic Surgery | Admitting: Orthopedic Surgery

## 2012-06-05 LAB — BASIC METABOLIC PANEL
CO2: 28 mEq/L (ref 19–32)
Chloride: 104 mEq/L (ref 96–112)
GFR calc non Af Amer: 40 mL/min — ABNORMAL LOW (ref >90)
Glucose, Bld: 116 mg/dL — ABNORMAL HIGH (ref 70–99)
Potassium: 3.9 mEq/L (ref 3.5–5.1)
Sodium: 142 mEq/L (ref 135–145)

## 2012-06-06 NOTE — Progress Notes (Signed)
Showed Dr. Ivin Booty letter from Dr. Verdis Prime stating pt does not need telemetry after surgery- placed on chart.

## 2012-06-09 ENCOUNTER — Encounter: Payer: Self-pay | Admitting: Physician Assistant

## 2012-06-09 ENCOUNTER — Other Ambulatory Visit: Payer: Self-pay | Admitting: Physician Assistant

## 2012-06-09 DIAGNOSIS — E079 Disorder of thyroid, unspecified: Secondary | ICD-10-CM

## 2012-06-09 DIAGNOSIS — F419 Anxiety disorder, unspecified: Secondary | ICD-10-CM | POA: Insufficient documentation

## 2012-06-09 DIAGNOSIS — I2510 Atherosclerotic heart disease of native coronary artery without angina pectoris: Secondary | ICD-10-CM | POA: Insufficient documentation

## 2012-06-09 DIAGNOSIS — M1990 Unspecified osteoarthritis, unspecified site: Secondary | ICD-10-CM | POA: Insufficient documentation

## 2012-06-09 DIAGNOSIS — M75101 Unspecified rotator cuff tear or rupture of right shoulder, not specified as traumatic: Secondary | ICD-10-CM

## 2012-06-09 DIAGNOSIS — K219 Gastro-esophageal reflux disease without esophagitis: Secondary | ICD-10-CM | POA: Insufficient documentation

## 2012-06-09 DIAGNOSIS — Z9889 Other specified postprocedural states: Secondary | ICD-10-CM | POA: Insufficient documentation

## 2012-06-09 DIAGNOSIS — I10 Essential (primary) hypertension: Secondary | ICD-10-CM | POA: Insufficient documentation

## 2012-06-09 DIAGNOSIS — R112 Nausea with vomiting, unspecified: Secondary | ICD-10-CM

## 2012-06-09 NOTE — H&P (Signed)
[name] is an 66 y.o. female.   Chief Complaint: right shoulder pain HPI: [name] is a 66 year old seen for follow-up from her right shoulder and right arm pain. She had a right shoulder MRI on 04/11/12 that revealed a recurrent rotator cuff tear with no atrophy with mild impingement. She had a cervical MRI on 04/10/12 that revealed a small central disc bulge at C4-5 asymmetric to the right. She continues to have significant right shoulder pain neck pain and right arm pain. This is worse with activity and relieved by rest but she has night pain as well.   Past Medical History  Diagnosis Date  . Diabetes mellitus   . Glaucoma(365)   . Coronary artery disease   . Thyroid disease   . Arthritis   . Hypertension   . GERD (gastroesophageal reflux disease)   . PONV (postoperative nausea and vomiting)   . Wolf-Parkinson-White syndrome   . Anxiety   . Right rotator cuff tear     Past Surgical History  Procedure Laterality Date  . Thyroid surgery    . Abdominal hysterectomy    . Skin biopsy    . Back surgery  2006    lumb fusion  . Breast surgery  2007    breast reduction  . Eye surgery  2005    cataracts  . Cholecystectomy  1981  . Knee arthroscopy  2009    rt  . Ankle arthroplasty  2010    lt  . Appendectomy    . Colonoscopy      Family History  Problem Relation Age of Onset  . Hypertension    . Arthritis     Social History:  reports that she quit smoking about 36 years ago. She does not have any smokeless tobacco history on file. She reports that she does not drink alcohol. Her drug history is not on file.  Allergies:  Allergies  Allergen Reactions  . Contrast Media (Iodinated Diagnostic Agents) Shortness Of Breath    bp elevated  . Allopurinol   . Codeine   . Colchicine   . Esomeprazole Magnesium   . Hydrocodone   . Meperidine Hcl   . Oxycodone Nausea And Vomiting  . Pentazocine Lactate      (Not in a hospital admission)  No results found for this  or any previous visit (from the past 48 hour(s)). No results found.  Review of Systems  Constitutional: Negative.   HENT: Positive for neck pain.   Eyes: Negative.   Respiratory: Negative.   Cardiovascular: Negative.   Gastrointestinal: Negative.   Genitourinary: Negative.   Musculoskeletal:       Right shoulder pain  Skin: Negative.   Neurological: Negative.   Endo/Heme/Allergies: Negative.   Psychiatric/Behavioral: Negative.     There were no vitals taken for this visit. Physical Exam  Constitutional: She is oriented to person, place, and time. She appears well-developed and well-nourished.  HENT:  Head: Normocephalic.  Eyes: Conjunctivae are normal. Pupils are equal, round, and reactive to light.  Neck: Neck supple.  Cardiovascular: Normal rate and regular rhythm.   Respiratory: Effort normal.  GI: Soft.  Genitourinary:  Not pertinent to current symptomatology therefore not examined.  Musculoskeletal:  Examination of her right shoulder reveals active forward flexion of 90 passive to 170 with pain, active abduction of 70 passive to 120 with pain, internal and external rotation of 50 degrees with pain and weakness no instability. Exam of her left shoulder reveals full   range of motion without pain swelling weakness or instability. Cervical exam reveals right paracervical pain positive Spurling's to the right range of motion 75% of normal. Neurologic exam: distal motor and sensory examination subjectively decreased sensation in the thumb index and 3rd fingers, grip strength is slightly decreased on the right.   Neurological: She is alert and oriented to person, place, and time.  Skin: Skin is warm and dry.  Psychiatric: She has a normal mood and affect. Her behavior is normal.     Assessment Patient Active Problem List  Diagnosis  . THYROID NODULE, LEFT  . ANEMIA, IRON DEFICIENCY  . GLAUCOMA  . HYPERTENSION  . CORONARY ARTERY DISEASE  . WOLFF (WOLFE)-PARKINSON-WHITE (WPW)  SYNDROME  . DIVERTICULOSIS-COLON  . GESTATIONAL DIABETES  . DEGENERATIVE JOINT DISEASE, ANKLE  . DEGENERATIVE JOINT DISEASE, LUMBAR SPINE  . SKIN RASH  . DIARRHEA  . PERSONAL HX COLONIC POLYPS  . FEVER, HX OF  . Right rotator cuff tear  . Anxiety  . PONV (postoperative nausea and vomiting)  . GERD (gastroesophageal reflux disease)  . Hypertension  . Arthritis  . Thyroid disease  . Coronary artery disease   Plan I talked to her about this in detail. I do think with her significant pain and lack of response to conservative care with findings on MRI that she may need a right shoulder arthroscopy for rotator cuff repair. Discussed risks benefits and possible complications of the surgery in detail and she understands this completely. We'll plan on setting her up for this at some point in the near future. We give her a home exercise program for her shoulder as well.  [name] 06/09/2012, 4:04 PM    

## 2012-06-10 ENCOUNTER — Ambulatory Visit (HOSPITAL_BASED_OUTPATIENT_CLINIC_OR_DEPARTMENT_OTHER)
Admission: RE | Admit: 2012-06-10 | Discharge: 2012-06-10 | Disposition: A | Discharge status: Home / Self Care | Payer: Medicare Other | Source: Ambulatory Visit | Attending: Orthopedic Surgery | Admitting: Orthopedic Surgery

## 2012-06-10 ENCOUNTER — Encounter (HOSPITAL_BASED_OUTPATIENT_CLINIC_OR_DEPARTMENT_OTHER)
Admission: RE | Disposition: A | Discharge status: Home / Self Care | Payer: Self-pay | Source: Ambulatory Visit | Attending: Orthopedic Surgery

## 2012-06-10 ENCOUNTER — Encounter (HOSPITAL_BASED_OUTPATIENT_CLINIC_OR_DEPARTMENT_OTHER): Payer: Self-pay

## 2012-06-10 ENCOUNTER — Ambulatory Visit (HOSPITAL_BASED_OUTPATIENT_CLINIC_OR_DEPARTMENT_OTHER): Payer: Medicare Other | Admitting: Anesthesiology

## 2012-06-10 ENCOUNTER — Encounter (HOSPITAL_BASED_OUTPATIENT_CLINIC_OR_DEPARTMENT_OTHER): Payer: Self-pay | Admitting: Anesthesiology

## 2012-06-10 DIAGNOSIS — M75110 Incomplete rotator cuff tear or rupture of unspecified shoulder, not specified as traumatic: Secondary | ICD-10-CM | POA: Insufficient documentation

## 2012-06-10 DIAGNOSIS — F411 Generalized anxiety disorder: Secondary | ICD-10-CM | POA: Insufficient documentation

## 2012-06-10 DIAGNOSIS — M19019 Primary osteoarthritis, unspecified shoulder: Secondary | ICD-10-CM | POA: Insufficient documentation

## 2012-06-10 DIAGNOSIS — I456 Pre-excitation syndrome: Secondary | ICD-10-CM | POA: Insufficient documentation

## 2012-06-10 DIAGNOSIS — I2510 Atherosclerotic heart disease of native coronary artery without angina pectoris: Secondary | ICD-10-CM | POA: Insufficient documentation

## 2012-06-10 DIAGNOSIS — Z90710 Acquired absence of both cervix and uterus: Secondary | ICD-10-CM | POA: Insufficient documentation

## 2012-06-10 DIAGNOSIS — M24819 Other specific joint derangements of unspecified shoulder, not elsewhere classified: Secondary | ICD-10-CM | POA: Insufficient documentation

## 2012-06-10 DIAGNOSIS — H409 Unspecified glaucoma: Secondary | ICD-10-CM | POA: Insufficient documentation

## 2012-06-10 DIAGNOSIS — M25819 Other specified joint disorders, unspecified shoulder: Secondary | ICD-10-CM | POA: Insufficient documentation

## 2012-06-10 DIAGNOSIS — I10 Essential (primary) hypertension: Secondary | ICD-10-CM | POA: Insufficient documentation

## 2012-06-10 DIAGNOSIS — E119 Type 2 diabetes mellitus without complications: Secondary | ICD-10-CM | POA: Insufficient documentation

## 2012-06-10 DIAGNOSIS — K219 Gastro-esophageal reflux disease without esophagitis: Secondary | ICD-10-CM | POA: Insufficient documentation

## 2012-06-10 DIAGNOSIS — E079 Disorder of thyroid, unspecified: Secondary | ICD-10-CM | POA: Insufficient documentation

## 2012-06-10 DIAGNOSIS — Z885 Allergy status to narcotic agent status: Secondary | ICD-10-CM | POA: Insufficient documentation

## 2012-06-10 HISTORY — DX: Other specified postprocedural states: Z98.890

## 2012-06-10 HISTORY — PX: SHOULDER ARTHROSCOPY WITH ROTATOR CUFF REPAIR AND SUBACROMIAL DECOMPRESSION: SHX5686

## 2012-06-10 HISTORY — DX: Other specified postprocedural states: R11.2

## 2012-06-10 HISTORY — DX: Anxiety disorder, unspecified: F41.9

## 2012-06-10 SURGERY — SHOULDER ARTHROSCOPY WITH ROTATOR CUFF REPAIR AND SUBACROMIAL DECOMPRESSION
Anesthesia: General | Laterality: Right | Wound class: Clean

## 2012-06-10 MED ORDER — FENTANYL CITRATE 0.05 MG/ML IJ SOLN: 25.0000 ug | INTRAMUSCULAR | Status: DC | PRN

## 2012-06-10 MED ORDER — ONDANSETRON HCL 4 MG/2ML IJ SOLN: 4.0000 mg | Freq: Four times a day (QID) | INTRAMUSCULAR | Status: DC | PRN

## 2012-06-10 MED ORDER — DEXAMETHASONE SODIUM PHOSPHATE 4 MG/ML IJ SOLN
INTRAMUSCULAR | Status: DC | PRN
  Administered 2012-06-10: 10 mg via INTRAVENOUS

## 2012-06-10 MED ORDER — MIDAZOLAM HCL 2 MG/2ML IJ SOLN: 1.0000 mg | INTRAMUSCULAR | Status: DC | PRN

## 2012-06-10 MED ORDER — SUCCINYLCHOLINE CHLORIDE 20 MG/ML IJ SOLN
INTRAMUSCULAR | Status: DC | PRN
  Administered 2012-06-10: 100 mg via INTRAVENOUS

## 2012-06-10 MED ORDER — SODIUM CHLORIDE 0.9 % IR SOLN
Status: DC | PRN
  Administered 2012-06-10: 6000 mL

## 2012-06-10 MED ORDER — LIDOCAINE HCL (CARDIAC) 20 MG/ML IV SOLN
INTRAVENOUS | Status: DC | PRN
  Administered 2012-06-10: 75 mg via INTRAVENOUS

## 2012-06-10 MED ORDER — PROPOFOL 10 MG/ML IV BOLUS
INTRAVENOUS | Status: DC | PRN
  Administered 2012-06-10: 200 mg via INTRAVENOUS

## 2012-06-10 MED ORDER — BUPIVACAINE-EPINEPHRINE PF 0.5-1:200000 % IJ SOLN
INTRAMUSCULAR | Status: DC | PRN
  Administered 2012-06-10: 30 mL

## 2012-06-10 MED ORDER — EPINEPHRINE HCL 1 MG/ML IJ SOLN
INTRAMUSCULAR | Status: DC | PRN
  Administered 2012-06-10: 2 mg

## 2012-06-10 MED ORDER — FENTANYL CITRATE 0.05 MG/ML IJ SOLN
INTRAMUSCULAR | Status: DC | PRN
  Administered 2012-06-10: 50 ug via INTRAVENOUS

## 2012-06-10 MED ORDER — CEFAZOLIN SODIUM-DEXTROSE 2-3 GM-% IV SOLR
2.0000 g | INTRAVENOUS | Status: AC
  Administered 2012-06-10: 2 g via INTRAVENOUS

## 2012-06-10 MED ORDER — ONDANSETRON HCL 4 MG/2ML IJ SOLN
INTRAMUSCULAR | Status: DC | PRN
  Administered 2012-06-10: 4 mg via INTRAVENOUS

## 2012-06-10 MED ORDER — CHLORHEXIDINE GLUCONATE 4 % EX LIQD: 60.0000 mL | Freq: Once | CUTANEOUS | Status: DC

## 2012-06-10 MED ORDER — FENTANYL CITRATE 0.05 MG/ML IJ SOLN: 50.0000 ug | INTRAMUSCULAR | Status: DC | PRN

## 2012-06-10 MED ORDER — LACTATED RINGERS IV SOLN
INTRAVENOUS | Status: DC
  Administered 2012-06-10 (×2): via INTRAVENOUS

## 2012-06-10 MED ORDER — PHENYLEPHRINE HCL 10 MG/ML IJ SOLN
INTRAMUSCULAR | Status: DC | PRN
  Administered 2012-06-10: 40 ug via INTRAVENOUS

## 2012-06-10 MED ORDER — FENTANYL CITRATE 0.05 MG/ML IJ SOLN
50.0000 ug | INTRAMUSCULAR | Status: DC | PRN
  Administered 2012-06-10: 100 ug via INTRAVENOUS

## 2012-06-10 MED ORDER — MIDAZOLAM HCL 2 MG/2ML IJ SOLN
1.0000 mg | INTRAMUSCULAR | Status: DC | PRN
  Administered 2012-06-10: 2 mg via INTRAVENOUS

## 2012-06-10 SURGICAL SUPPLY — 74 items
BENZOIN TINCTURE PRP APPL 2/3 (GAUZE/BANDAGES/DRESSINGS) IMPLANT
BLADE CUDA 5.5 (BLADE) IMPLANT
BLADE CUTTER GATOR 3.5 (BLADE) ×2 IMPLANT
BLADE GREAT WHITE 4.2 (BLADE) IMPLANT
BLADE SURG 15 STRL LF DISP TIS (BLADE) ×1 IMPLANT
BLADE SURG 15 STRL SS (BLADE) ×2
BLADE SURG ROTATE 9660 (MISCELLANEOUS) IMPLANT
BNDG COHESIVE 4X5 TAN STRL (GAUZE/BANDAGES/DRESSINGS) ×2 IMPLANT
BUR OVAL 6.0 (BURR) ×2 IMPLANT
CANISTER OMNI JUG 16 LITER (MISCELLANEOUS) ×2 IMPLANT
CANISTER SUCTION 2500CC (MISCELLANEOUS) IMPLANT
CANNULA TWIST IN 8.25X7CM (CANNULA) IMPLANT
DECANTER SPIKE VIAL GLASS SM (MISCELLANEOUS) IMPLANT
DRAPE SHOULDER BEACH CHAIR (DRAPES) ×2 IMPLANT
DRAPE U-SHAPE 47X51 STRL (DRAPES) ×4 IMPLANT
DRSG PAD ABDOMINAL 8X10 ST (GAUZE/BANDAGES/DRESSINGS) ×2 IMPLANT
DURAPREP 26ML APPLICATOR (WOUND CARE) ×2 IMPLANT
ELECT REM PT RETURN 9FT ADLT (ELECTROSURGICAL) ×2
ELECTRODE REM PT RTRN 9FT ADLT (ELECTROSURGICAL) ×1 IMPLANT
GAUZE XEROFORM 1X8 LF (GAUZE/BANDAGES/DRESSINGS) ×2 IMPLANT
GLOVE BIO SURGEON STRL SZ7 (GLOVE) IMPLANT
GLOVE BIO SURGEON STRL SZ7.5 (GLOVE) ×2 IMPLANT
GLOVE BIOGEL PI IND STRL 7.0 (GLOVE) IMPLANT
GLOVE BIOGEL PI IND STRL 7.5 (GLOVE) ×1 IMPLANT
GLOVE BIOGEL PI IND STRL 8 (GLOVE) ×1 IMPLANT
GLOVE BIOGEL PI INDICATOR 7.0 (GLOVE)
GLOVE BIOGEL PI INDICATOR 7.5 (GLOVE) ×1
GLOVE BIOGEL PI INDICATOR 8 (GLOVE) ×1
GLOVE SS BIOGEL STRL SZ 7.5 (GLOVE) ×1 IMPLANT
GLOVE SUPERSENSE BIOGEL SZ 7.5 (GLOVE) ×1
GOWN PREVENTION PLUS XLARGE (GOWN DISPOSABLE) ×6 IMPLANT
NDL SAFETY ECLIPSE 18X1.5 (NEEDLE) ×1 IMPLANT
NDL SUT 6 .5 CRC .975X.05 MAYO (NEEDLE) IMPLANT
NEEDLE 1/2 CIR CATGUT .05X1.09 (NEEDLE) IMPLANT
NEEDLE HYPO 18GX1.5 SHARP (NEEDLE) ×2
NEEDLE MAYO TAPER (NEEDLE)
NEEDLE SCORPION MULTI FIRE (NEEDLE) IMPLANT
PACK ARTHROSCOPY DSU (CUSTOM PROCEDURE TRAY) ×2 IMPLANT
PACK BASIN DAY SURGERY FS (CUSTOM PROCEDURE TRAY) ×2 IMPLANT
PAD ALCOHOL SWAB (MISCELLANEOUS) ×4 IMPLANT
PENCIL BUTTON HOLSTER BLD 10FT (ELECTRODE) IMPLANT
SET ARTHROSCOPY TUBING (MISCELLANEOUS) ×2
SET ARTHROSCOPY TUBING LN (MISCELLANEOUS) ×1 IMPLANT
SHEET MEDIUM DRAPE 40X70 STRL (DRAPES) IMPLANT
SLEEVE SCD COMPRESS KNEE MED (MISCELLANEOUS) IMPLANT
SLING ARM FOAM STRAP LRG (SOFTGOODS) IMPLANT
SLING ARM FOAM STRAP MED (SOFTGOODS) IMPLANT
SLING ARM FOAM STRAP XLG (SOFTGOODS) ×2 IMPLANT
SLING ARM IMMOBILIZER MED (SOFTGOODS) IMPLANT
SLING ULTRA III MED (ORTHOPEDIC SUPPLIES) IMPLANT
SPONGE GAUZE 4X4 12PLY (GAUZE/BANDAGES/DRESSINGS) ×2 IMPLANT
SPONGE LAP 4X18 X RAY DECT (DISPOSABLE) IMPLANT
STRIP CLOSURE SKIN 1/2X4 (GAUZE/BANDAGES/DRESSINGS) IMPLANT
SUCTION FRAZIER TIP 10 FR DISP (SUCTIONS) IMPLANT
SUT ETHILON 3 0 PS 1 (SUTURE) ×2 IMPLANT
SUT FIBERWIRE #2 38 T-5 BLUE (SUTURE)
SUT PDS AB 2-0 CT2 27 (SUTURE) IMPLANT
SUT PROLENE 3 0 PS 2 (SUTURE) IMPLANT
SUT TIGER TAPE 7 IN WHITE (SUTURE) IMPLANT
SUT VIC AB 0 SH 27 (SUTURE) IMPLANT
SUT VIC AB 2-0 PS2 27 (SUTURE) IMPLANT
SUT VIC AB 2-0 SH 27 (SUTURE)
SUT VIC AB 2-0 SH 27XBRD (SUTURE) IMPLANT
SUTURE FIBERWR #2 38 T-5 BLUE (SUTURE) IMPLANT
SYR 20CC LL (SYRINGE) IMPLANT
SYR 5ML LL (SYRINGE) ×2 IMPLANT
SYR BULB 3OZ (MISCELLANEOUS) IMPLANT
TAPE FIBER 2MM 7IN #2 BLUE (SUTURE) IMPLANT
TAPE HYPAFIX 6X30 (GAUZE/BANDAGES/DRESSINGS) IMPLANT
TAPE STRIPS DRAPE STRL (GAUZE/BANDAGES/DRESSINGS) ×2 IMPLANT
TOWEL OR 17X24 6PK STRL BLUE (TOWEL DISPOSABLE) ×2 IMPLANT
TUBE CONNECTING 20X1/4 (TUBING) IMPLANT
WAND STAR VAC 90 (SURGICAL WAND) ×2 IMPLANT
WATER STERILE IRR 1000ML POUR (IV SOLUTION) ×2 IMPLANT

## 2012-06-10 NOTE — Interval H&P Note (Signed)
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 After consideration of risks, benefits and other options for treatment, the patient has consented to  Procedure(s) with comments: SHOULDER ARTHROSCOPY WITH ROTATOR CUFF REPAIR AND SUBACROMIAL DECOMPRESSION (Right) - RIGHT SHOULDER ARTHROSCOPY, DECOMPRESSION SUBACROMIAL PARTIAL ACROMIOPLASTY WITH CORACOACROMIAL RELEASE,, DISTAL CLAVICULECTOMY , ROTATOR CUFF REPAIR  as a surgical intervention .  The patient's history has been reviewed, patient examined, no change in status, stable for surgery.  I have reviewed the patient's chart and labs.  Questions were answered to the patient's satisfaction.     [name]

## 2012-06-10 NOTE — Progress Notes (Signed)
Assisted Dr. Hodierne with right, ultrasound guided, interscalene  block. Side rails up, monitors on throughout procedure. See vital signs in flow sheet. Tolerated Procedure well. 

## 2012-06-10 NOTE — Brief Op Note (Signed)
06/10/2012  1:44 PM  PATIENT:  [name]  67 y.o. 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 complete rupture of rotator cuff   PROCEDURE: SHOULDER ARTHROSCOPY  SUBACROMIAL DECOMPRESSION (Right) - RIGHT SHOULDER ARTHROSCOPY, DECOMPRESSION SUBACROMIAL PARTIAL ACROMIOPLASTY WITH CORACOACROMIAL RELEASE,, DISTAL CLAVICULECTOMY , ROTATOR CUFF debriedment SURGEON:  Surgeon(s) and Role:    * Nilda Simmer, MD - Primary  PHYSICIAN ASSISTANT: Margart Sickles, PA-C  ASSISTANTS:   ANESTHESIA:   general and block  EBL:  Total I/O In: 1200 [I.V.:1200] Out: -   BLOOD ADMINISTERED:none  DRAINS: none   LOCAL MEDICATIONS USED:  NONE  SPECIMEN:  No Specimen  DISPOSITION OF SPECIMEN:  N/A  COUNTS:  YES  TOURNIQUET:  * No tourniquets in log *  DICTATION: .Dragon Dictation  PLAN OF CARE: Discharge to home after PACU  PATIENT DISPOSITION:  PACU - hemodynamically stable.   Delay start of Pharmacological VTE agent (>24hrs) due to surgical blood loss or risk of bleeding: not applicable

## 2012-06-10 NOTE — Anesthesia Postprocedure Evaluation (Signed)
Anesthesia Post Note  Patient: [name]  Procedure(s) Performed: Procedure(s) (LRB): SHOULDER ARTHROSCOPY WITH ROTATOR CUFF REPAIR AND SUBACROMIAL DECOMPRESSION (Right)  Anesthesia type: General  Patient location: PACU  Post pain: Pain level controlled and Adequate analgesia  Post assessment: Post-op Vital signs reviewed, Patient's Cardiovascular Status Stable, Respiratory Function Stable, Patent Airway and Pain level controlled  Last Vitals:  Filed Vitals:   06/10/12 1400  BP: 148/82  Pulse: 53  Temp:   Resp: 16    Post vital signs: Reviewed and stable  Level of consciousness: awake, alert  and oriented  Complications: No apparent anesthesia complications

## 2012-06-10 NOTE — Transfer of Care (Signed)
Immediate Anesthesia Transfer of Care Note  Patient: [name]  Procedure(s) Performed: Procedure(s) with comments: SHOULDER ARTHROSCOPY WITH ROTATOR CUFF REPAIR AND SUBACROMIAL DECOMPRESSION (Right) - RIGHT SHOULDER ARTHROSCOPY, DECOMPRESSION SUBACROMIAL PARTIAL ACROMIOPLASTY WITH CORACOACROMIAL RELEASE,, DISTAL CLAVICULECTOMY , ROTATOR CUFF debriedment  Patient Location: PACU  Anesthesia Type:General and Regional  Level of Consciousness: awake and alert   Airway & Oxygen Therapy: Patient Spontanous Breathing and Patient connected to face mask oxygen  Post-op Assessment: Report given to PACU RN and Post -op Vital signs reviewed and stable  Post vital signs: Reviewed and stable  Complications: No apparent anesthesia complications

## 2012-06-10 NOTE — Anesthesia Preprocedure Evaluation (Addendum)
Anesthesia Evaluation  Patient identified by MRN, date of birth, ID band Patient awake    Reviewed: Allergy & Precautions, H&P , NPO status , Patient's Chart, lab work & pertinent test results  History of Anesthesia Complications (+) PONV  Airway Mallampati: II  Neck ROM: full    Dental   Pulmonary former smoker,          Cardiovascular hypertension, + dysrhythmias  H/o WPW with occasional tachycardia.   Neuro/Psych Anxiety    GI/Hepatic GERD-  ,  Endo/Other  diabetes, Type 2obese  Renal/GU      Musculoskeletal   Abdominal   Peds  Hematology   Anesthesia Other Findings   Reproductive/Obstetrics                          Anesthesia Physical Anesthesia Plan  ASA: III  Anesthesia Plan: General and Regional   Post-op Pain Management: MAC Combined w/ Regional for Post-op pain   Induction: Intravenous  Airway Management Planned: Oral ETT  Additional Equipment:   Intra-op Plan:   Post-operative Plan: Extubation in OR  Informed Consent: I have reviewed the patients History and Physical, chart, labs and discussed the procedure including the risks, benefits and alternatives for the proposed anesthesia with the patient or authorized representative who has indicated his/her understanding and acceptance.     Plan Discussed with: CRNA and Surgeon  Anesthesia Plan Comments:         Anesthesia Quick Evaluation

## 2012-06-10 NOTE — Anesthesia Procedure Notes (Signed)
Anesthesia Regional Block:  Interscalene brachial plexus block  Pre-Anesthetic Checklist: ,, timeout performed, Correct Patient, Correct Site, Correct Laterality, Correct Procedure, Correct Position, site marked, Risks and benefits discussed,  Surgical consent,  Pre-op evaluation,  At surgeon's request and post-op pain management  Laterality: Right  Prep: chloraprep       Needles:  Injection technique: Single-shot  Needle Type: Echogenic Stimulator Needle     Needle Length: 5cm 5 cm Needle Gauge: 22 and 22 G    Additional Needles:  Procedures: ultrasound guided (picture in chart) and nerve stimulator Interscalene brachial plexus block  Nerve Stimulator or Paresthesia:  Response: biceps flexion, 0.45 mA,   Additional Responses:   Narrative:  Start time: 06/10/2012 11:56 AM End time: 06/10/2012 12:05 PM Injection made incrementally with aspirations every 5 mL.  Performed by: Personally  Anesthesiologist: Dr Chaney Malling  Additional Notes: Functioning IV was confirmed and monitors were applied.  A 50mm 22ga Arrow echogenic stimulator needle was used. Sterile prep and drape,hand hygiene and sterile gloves were used.  Negative aspiration and negative test dose prior to incremental administration of local anesthetic. The patient tolerated the procedure well.  Ultrasound guidance: relevent anatomy identified, needle position confirmed, local anesthetic spread visualized around nerve(s), vascular puncture avoided.  Image printed for medical record.   Interscalene brachial plexus block

## 2012-06-10 NOTE — H&P (View-Only) (Signed)
[name] is an 67 y.o. female.   Chief Complaint: right shoulder pain HPI: [name] is a 68 year old seen for follow-up from her right shoulder and right arm pain. She had a right shoulder MRI on 04/11/12 that revealed a recurrent rotator cuff tear with no atrophy with mild impingement. She had a cervical MRI on 04/10/12 that revealed a small central disc bulge at C4-5 asymmetric to the right. She continues to have significant right shoulder pain neck pain and right arm pain. This is worse with activity and relieved by rest but she has night pain as well.   Past Medical History  Diagnosis Date  . Diabetes mellitus   . Glaucoma(365)   . Coronary artery disease   . Thyroid disease   . Arthritis   . Hypertension   . GERD (gastroesophageal reflux disease)   . PONV (postoperative nausea and vomiting)   . Wolf-Parkinson-White syndrome   . Anxiety   . Right rotator cuff tear     Past Surgical History  Procedure Laterality Date  . Thyroid surgery    . Abdominal hysterectomy    . Skin biopsy    . Back surgery  2006    lumb fusion  . Breast surgery  2007    breast reduction  . Eye surgery  2005    cataracts  . Cholecystectomy  1981  . Knee arthroscopy  2009    rt  . Ankle arthroplasty  2010    lt  . Appendectomy    . Colonoscopy      Family History  Problem Relation Age of Onset  . Hypertension    . Arthritis     Social History:  reports that she quit smoking about 36 years ago. She does not have any smokeless tobacco history on file. She reports that she does not drink alcohol. Her drug history is not on file.  Allergies:  Allergies  Allergen Reactions  . Contrast Media (Iodinated Diagnostic Agents) Shortness Of Breath    bp elevated  . Allopurinol   . Codeine   . Colchicine   . Esomeprazole Magnesium   . Hydrocodone   . Meperidine Hcl   . Oxycodone Nausea And Vomiting  . Pentazocine Lactate      (Not in a hospital admission)  No results found for this  or any previous visit (from the past 48 hour(s)). No results found.  Review of Systems  Constitutional: Negative.   HENT: Positive for neck pain.   Eyes: Negative.   Respiratory: Negative.   Cardiovascular: Negative.   Gastrointestinal: Negative.   Genitourinary: Negative.   Musculoskeletal:       Right shoulder pain  Skin: Negative.   Neurological: Negative.   Endo/Heme/Allergies: Negative.   Psychiatric/Behavioral: Negative.     There were no vitals taken for this visit. Physical Exam  Constitutional: She is oriented to person, place, and time. She appears well-developed and well-nourished.  HENT:  Head: Normocephalic.  Eyes: Conjunctivae are normal. Pupils are equal, round, and reactive to light.  Neck: Neck supple.  Cardiovascular: Normal rate and regular rhythm.   Respiratory: Effort normal.  GI: Soft.  Genitourinary:  Not pertinent to current symptomatology therefore not examined.  Musculoskeletal:  Examination of her right shoulder reveals active forward flexion of 90 passive to 170 with pain, active abduction of 70 passive to 120 with pain, internal and external rotation of 50 degrees with pain and weakness no instability. Exam of her left shoulder reveals full  range of motion without pain swelling weakness or instability. Cervical exam reveals right paracervical pain positive Spurling's to the right range of motion 75% of normal. Neurologic exam: distal motor and sensory examination subjectively decreased sensation in the thumb index and 3rd fingers, grip strength is slightly decreased on the right.   Neurological: She is alert and oriented to person, place, and time.  Skin: Skin is warm and dry.  Psychiatric: She has a normal mood and affect. Her behavior is normal.     Assessment Patient Active Problem List  Diagnosis  . THYROID NODULE, LEFT  . ANEMIA, IRON DEFICIENCY  . GLAUCOMA  . HYPERTENSION  . CORONARY ARTERY DISEASE  . WOLFF (WOLFE)-PARKINSON-WHITE (WPW)  SYNDROME  . DIVERTICULOSIS-COLON  . GESTATIONAL DIABETES  . DEGENERATIVE JOINT DISEASE, ANKLE  . DEGENERATIVE JOINT DISEASE, LUMBAR SPINE  . SKIN RASH  . DIARRHEA  . PERSONAL HX COLONIC POLYPS  . FEVER, HX OF  . Right rotator cuff tear  . Anxiety  . PONV (postoperative nausea and vomiting)  . GERD (gastroesophageal reflux disease)  . Hypertension  . Arthritis  . Thyroid disease  . Coronary artery disease   Plan I talked to her about this in detail. I do think with her significant pain and lack of response to conservative care with findings on MRI that she may need a right shoulder arthroscopy for rotator cuff repair. Discussed risks benefits and possible complications of the surgery in detail and she understands this completely. We'll plan on setting her up for this at some point in the near future. We give her a home exercise program for her shoulder as well.  [name] J 06/09/2012, 4:04 PM

## 2012-06-10 NOTE — Brief Op Note (Deleted)
06/10/2012  1:36 PM  PATIENT:  [name]  67 y.o. 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 complete rupture of rotator cuff   PROCEDURE:  Procedure(s) with comments: SHOULDER ARTHROSCOPY WITH ROTATOR CUFF REPAIR AND SUBACROMIAL DECOMPRESSION (Right) - RIGHT SHOULDER ARTHROSCOPY, DECOMPRESSION SUBACROMIAL PARTIAL ACROMIOPLASTY WITH CORACOACROMIAL RELEASE,, DISTAL CLAVICULECTOMY , ROTATOR CUFF debriedment  SURGEON:  Surgeon(s) and Role:    * Nilda Simmer, MD - Primary  PHYSICIAN ASSISTANT: Margart Sickles, PA-C  ASSISTANTS:    ANESTHESIA:   regional and general  EBL:  Total I/O In: 1000 [I.V.:1000] Out: -   BLOOD ADMINISTERED:none  DRAINS: none   LOCAL MEDICATIONS USED:  NONE  SPECIMEN:  No Specimen  DISPOSITION OF SPECIMEN:  N/A  COUNTS:  YES  TOURNIQUET:  * No tourniquets in log *  DICTATION: Other Dictation Number  PLAN OF CARE: Discharge to home after PACU  PATIENT DISPOSITION:  PACU - hemodynamically stable.   Delay start of Pharmacological VTE agent (>24hrs) due to surgical blood loss or risk of bleeding: not applicable

## 2012-06-11 ENCOUNTER — Encounter (HOSPITAL_BASED_OUTPATIENT_CLINIC_OR_DEPARTMENT_OTHER): Payer: Self-pay | Admitting: Orthopedic Surgery

## 2012-06-11 NOTE — Addendum Note (Signed)
Addendum created 06/11/12 0847 by Jewel Baize Larrisha Babineau, CRNA   Modules edited: Charges VN

## 2012-06-11 NOTE — Op Note (Signed)
NAME:  [name], [name]       ACCOUNT NO.:  [id number]  MEDICAL RECORD NO.:  [id number]  LOCATION:                                 FACILITY:  PHYSICIAN:  Elana Alm. Thurston Hole, M.D.      DATE OF BIRTH:  DATE OF PROCEDURE:  06/10/2012 DATE OF DISCHARGE:                              OPERATIVE REPORT   PREOPERATIVE DIAGNOSIS: 1. Right shoulder partial rotator cuff tear with partial labrum tear,     and partial biceps tendon tear. 2. Right shoulder impingement. 3. Right shoulder AC joint DJD with spurring.  POSTOPERATIVE DIAGNOSIS: 1. Right shoulder partial rotator cuff tear with partial labrum tear,     and partial biceps tendon tear. 2. Right shoulder impingement. 3. Right shoulder AC joint DJD with spurring.  PROCEDURE: 1. Right shoulder EUA followed by arthroscopic debridement, partial     rotator cuff tear, partial labrum tear, and partial biceps tendon     tear. 2. Right shoulder subacromial decompression. 3. Right shoulder distal clavicle excision.  SURGEON:  Elana Alm. Thurston Hole, M.D.  ASSISTANT:  Margart Sickles, PA-C.  ANESTHESIA:  General.  OPERATIVE TIME:  45 minutes.  COMPLICATIONS:  None.  INDICATION FOR PROCEDURE:  [name] is a 67 year old woman who has had significant right shoulder pain for the past 6 months increasing in nature with exam and MRI documenting partial versus complete rotator cuff tear, partial labrum tear with impingement and AC joint arthropathy.  She has failed multiple conservative modalities and is now to undergo arthroscopy.  DESCRIPTION OF PROCEDURE:  [name] was brought to the operating room on June 10, 2012, after an interscalene block was placed only by anesthesia.  She was placed on operative table in supine position.  She received preoperative antibiotics for prophylaxis.  After being placed under general anesthesia, her right shoulder was examined.  She had full range of motion and her shoulder was stable  ligamentous exam.  She was then placed in beach chair position and her shoulder and arm was prepped using sterile DuraPrep and draped using sterile technique.  Time-out procedure was called and the correct right shoulder identified. Initially, through a posterior arthroscopic portal, the arthroscope with a pump attached was placed into an anterior portal, an arthroscopic probe was placed.  On initial inspection, the articular cartilage in the glenohumeral joint showed grade 1 and 2 chondromalacia.  She had partial tearing of the anterior and posterior labrum 25%, which was debrided. The anterior-inferior labrum and anterior-inferior glenohumeral ligament complex was intact.  Biceps tendon anchor was intact.  Biceps tendon showed partial tearing 25%, which was debrided.  The rotator cuff showed partial tearing of the infraspinatus and supraspinatus 25%-30%, which was debrided, but it was otherwise well attached.  The rest of the rotator cuff was intact.  Inferior capsular recess free of pathology. Subacromial space was entered and a lateral arthroscopic portal was made.  Moderately thickened bursitis was resected.  The rotator cuff showed partial tearing of 25% on the bursal surface, there were retained sutures from her previous cuff repair that were not disturbed. Impingement was noted and a subacromial decompression was carried out removing 6-8 mm of the undersurface of the anterior, anterolateral, and anteromedial acromion, and  CA ligament release carried out as well.  The [facility] joint showed significant spurring and degenerative changes and the distal 8-10 mm of clavicle was resected with a 6-mm bur.  After this was done, the shoulder could be brought through a full range of motion with no impingement on the rotator cuff.  At this point, it is felt that all pathology have been satisfactorily addressed.  The instruments removed. Portals closed with 3-0 nylon suture.  Sterile dressings and a  sling applied and the patient awakened and taken to recovery room in stable condition.  FOLLOWUP CARE:  [name] to be followed as an outpatient on Dilaudid and Valium with early physical therapy.  She will be seen back in office in a week for sutures out and followup.     [name] A. Thurston Hole, M.D.     RAW/MEDQ  D:  06/10/2012  T:  06/10/2012  Job:  [id number]

## 2012-07-21 ENCOUNTER — Other Ambulatory Visit: Payer: Self-pay | Admitting: Nephrology

## 2012-07-24 ENCOUNTER — Ambulatory Visit
Admission: RE | Admit: 2012-07-24 | Discharge: 2012-07-24 | Disposition: A | Discharge status: Home / Self Care | Payer: Medicare Other | Source: Ambulatory Visit | Attending: Nephrology | Admitting: Nephrology

## 2012-07-25 ENCOUNTER — Other Ambulatory Visit: Payer: Self-pay

## 2012-07-25 DIAGNOSIS — Z1231 Encounter for screening mammogram for malignant neoplasm of breast: Secondary | ICD-10-CM

## 2012-07-25 DIAGNOSIS — Z9889 Other specified postprocedural states: Secondary | ICD-10-CM

## 2012-08-14 ENCOUNTER — Ambulatory Visit
Admission: RE | Admit: 2012-08-14 | Discharge: 2012-08-14 | Disposition: A | Discharge status: Home / Self Care | Payer: Medicare Other | Source: Ambulatory Visit

## 2012-08-14 ENCOUNTER — Other Ambulatory Visit: Payer: Self-pay | Admitting: Obstetrics and Gynecology

## 2012-08-14 DIAGNOSIS — Z9889 Other specified postprocedural states: Secondary | ICD-10-CM

## 2012-08-14 DIAGNOSIS — Z1231 Encounter for screening mammogram for malignant neoplasm of breast: Secondary | ICD-10-CM

## 2013-07-27 ENCOUNTER — Other Ambulatory Visit: Payer: Self-pay

## 2013-07-27 DIAGNOSIS — Z9889 Other specified postprocedural states: Secondary | ICD-10-CM

## 2013-07-27 DIAGNOSIS — Z1231 Encounter for screening mammogram for malignant neoplasm of breast: Secondary | ICD-10-CM

## 2013-08-17 ENCOUNTER — Ambulatory Visit
Admission: RE | Admit: 2013-08-17 | Discharge: 2013-08-17 | Disposition: A | Discharge status: Home / Self Care | Payer: Medicare Other | Source: Ambulatory Visit

## 2013-08-17 DIAGNOSIS — Z9889 Other specified postprocedural states: Secondary | ICD-10-CM

## 2013-08-17 DIAGNOSIS — Z1231 Encounter for screening mammogram for malignant neoplasm of breast: Secondary | ICD-10-CM

## 2013-09-22 ENCOUNTER — Other Ambulatory Visit: Payer: Self-pay | Admitting: Dermatology

## 2013-11-25 LAB — IFOBT (OCCULT BLOOD): IMMUNOLOGICAL FECAL OCCULT BLOOD TEST: NEGATIVE

## 2014-01-02 ENCOUNTER — Encounter: Payer: Self-pay | Admitting: Gastroenterology

## 2014-06-11 DIAGNOSIS — M549 Dorsalgia, unspecified: Secondary | ICD-10-CM | POA: Insufficient documentation

## 2014-06-16 DIAGNOSIS — M5136 Other intervertebral disc degeneration, lumbar region: Secondary | ICD-10-CM | POA: Insufficient documentation

## 2014-06-21 ENCOUNTER — Ambulatory Visit: Payer: Medicare Other | Admitting: Interventional Cardiology

## 2014-06-21 ENCOUNTER — Encounter: Payer: Self-pay | Admitting: Interventional Cardiology

## 2014-06-21 ENCOUNTER — Ambulatory Visit (INDEPENDENT_AMBULATORY_CARE_PROVIDER_SITE_OTHER): Payer: Medicare Other | Admitting: Interventional Cardiology

## 2014-06-21 VITALS — BP 110/78 | HR 63 | Ht 63.0 in | Wt 221.2 lb

## 2014-06-21 DIAGNOSIS — I10 Essential (primary) hypertension: Secondary | ICD-10-CM

## 2014-06-21 DIAGNOSIS — I456 Pre-excitation syndrome: Secondary | ICD-10-CM

## 2014-06-21 DIAGNOSIS — I2510 Atherosclerotic heart disease of native coronary artery without angina pectoris: Secondary | ICD-10-CM

## 2014-06-21 NOTE — Progress Notes (Signed)
Cardiology Office Note   Date:  06/21/2014   ID:  ONNA NODAL, DOB 03-29-1946, MRN [id number]  PCP:  Donnajean Lopes, MD  Jovita Gamma, MD, M.D.; Elsie Saas. M.D. Cardiologist:   Sinclair Grooms, MD   No chief complaint on file.     History of Present Illness: [name] is a 69 y.o. female who presents for surgical clearance prior to lumbar disc surgery and total knee replacement. She has no cardiac complaints. 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 Catheterization for atypical chest pain greater than 10 years ago revealed nonobstructive coronary disease. She still has atypical episodes of chest discomfort that have not changed over the years. Her most recent ischemic evaluation was 2013 at which time she was also noted to have normal LV function.    Past Medical History  Diagnosis Date  . Diabetes mellitus   . Glaucoma   . Coronary artery disease   . Thyroid disease   . Arthritis   . Hypertension   . GERD (gastroesophageal reflux disease)   . PONV (postoperative nausea and vomiting)   . Wolf-Parkinson-White syndrome   . Anxiety   . Right rotator cuff tear     Past Surgical History  Procedure Laterality Date  . Thyroid surgery    . Abdominal hysterectomy    . Skin biopsy    . Back surgery  2006    lumb fusion  . Breast surgery  2007    breast reduction  . Eye surgery  2005    cataracts  . Cholecystectomy  1981  . Knee arthroscopy  2009    rt  . Ankle arthroplasty  2010    lt  . Appendectomy    . Colonoscopy    . Shoulder arthroscopy with rotator cuff repair and subacromial decompression Right 06/10/2012    Procedure: SHOULDER ARTHROSCOPY WITH ROTATOR CUFF REPAIR AND SUBACROMIAL DECOMPRESSION;  Surgeon: Lorn Junes, MD;  Location: [place];  Service: Orthopedics;  Laterality: Right;  RIGHT SHOULDER ARTHROSCOPY, DECOMPRESSION SUBACROMIAL PARTIAL ACROMIOPLASTY WITH CORACOACROMIAL RELEASE,, DISTAL  CLAVICULECTOMY , ROTATOR CUFF debriedment     Current Outpatient Prescriptions  Medication Sig Dispense Refill  . ALPRAZolam (XANAX) 0.5 MG tablet Take 0.5 mg by mouth 3 (three) times daily as needed.     Marland Kitchen atenolol (TENORMIN) 25 MG tablet Take 25 mg by mouth daily.    . brimonidine (ALPHAGAN P) 0.1 % SOLN Place 1 drop into both eyes 2 (two) times daily.    Marland Kitchen exenatide (BYETTA) 10 MCG/0.04ML SOLN Inject 10 mcg into the skin 2 (two) times daily before a meal.    . febuxostat (ULORIC) 40 MG tablet Take 80 mg by mouth daily.    . hydrochlorothiazide (,MICROZIDE/HYDRODIURIL,) 12.5 MG capsule Take 12.5 mg by mouth daily.      Marland Kitchen latanoprost (XALATAN) 0.005 % ophthalmic solution Place 1 drop into both eyes at bedtime.    Marland Kitchen levothyroxine (SYNTHROID, LEVOTHROID) 50 MCG tablet Take 50 mcg by mouth daily. Take 2 tablet by mouth on Sunday    . spironolactone (ALDACTONE) 50 MG tablet Take 25 mg by mouth daily.      No current facility-administered medications for this visit.    Allergies:   Contrast media; Allopurinol; Codeine; Colchicine; Esomeprazole magnesium; Hydrocodone; Meperidine hcl; Oxycodone; and Pentazocine lactate    Social History:  The patient  reports that she quit smoking about 38 years ago. She does not have any smokeless  tobacco history on file. She reports that she does not drink alcohol.   Family History:  The patient's family history includes Arthritis in an other family member; Diabetes in her mother; Heart attack in her father; Hypertension in an other family member.    ROS:  Please see the history of present illness.   Otherwise, review of systems are positive for none.   All other systems are reviewed and negative.    PHYSICAL EXAM: VS:  BP 110/78 mmHg  Pulse 63  Ht 5\' 3"  (1.6 m)  Wt 221 lb 3.2 oz (100.336 kg)  BMI 39.19 kg/m2 , BMI Body mass index is 39.19 kg/(m^2). GEN: Well nourished, well developed, in no acute distress HEENT: normal Neck: no JVD, carotid bruits,  or masses Cardiac: RRR; no murmurs, rubs, or gallops,no edema  Respiratory:  clear to auscultation bilaterally, normal work of breathing GI: soft, nontender, nondistended, + BS MS: no deformity or atrophy Skin: warm and dry, no rash Neuro:  Strength and sensation are intact Psych: euthymic mood, full affect   EKG:  EKG is ordered today. The ekg ordered today demonstrates normal sinus rhythm with normal appearance and relatively short PR of 126 ms   Recent Labs: No results found for requested labs within last 365 days.    Lipid Panel No results found for: CHOL, TRIG, HDL, CHOLHDL, VLDL, LDLCALC, LDLDIRECT    Wt Readings from Last 3 Encounters:  06/21/14 221 lb 3.2 oz (100.336 kg)  06/10/12 201 lb (91.173 kg)  11/10/10 234 lb 6.4 oz (106.323 kg)      Other studies Reviewed: Additional studies/ records that were reviewed today include: Records from [facility]. Review of the above records demonstrates: Reviewed the most recent nuclear study from 2013   ASSESSMENT AND PLAN:  1.  Preoperative cardiovascular clearance prior to neurosurgical and orthopedic procedures. The patient is cleared and no further cardiac evaluation is needed at this time. 2. WPW, asymptomatic without any history of significant arrhythmias. WPW EKG has been intermittently identified. 3. Coronary atherosclerosis, nonobstructive, with nonischemic Cardiolite study in 2013. 4. Hypertension under excellent control   Current medicines are reviewed at length with the patient today.  The patient does not have concerns regarding medicines.  The following changes have been made:  no change  Labs/ tests ordered today include: none  No orders of the defined types were placed in this encounter.     Disposition:   FU wiH. Ephraim Reichel  in as needed   Signed, Sinclair Grooms, MD  06/21/2014 9:12 AM    [facility] [address], [place], [place]  [address] Phone: [phone]; Fax: [phone]

## 2014-06-21 NOTE — Patient Instructions (Signed)
Your physician recommends that you continue on your current medications as directed. Please refer to the Current Medication list given to you today.  You have been cleared for your upcoming surgery  Your physician recommends that you schedule a follow-up appointment as needed

## 2014-06-29 ENCOUNTER — Other Ambulatory Visit: Payer: Self-pay | Admitting: Neurosurgery

## 2014-06-29 DIAGNOSIS — M4726 Other spondylosis with radiculopathy, lumbar region: Secondary | ICD-10-CM

## 2014-07-16 ENCOUNTER — Ambulatory Visit
Admission: RE | Admit: 2014-07-16 | Discharge: 2014-07-16 | Disposition: A | Discharge status: Home / Self Care | Payer: Medicare Other | Source: Ambulatory Visit | Attending: Neurosurgery | Admitting: Neurosurgery

## 2014-07-16 DIAGNOSIS — M4726 Other spondylosis with radiculopathy, lumbar region: Secondary | ICD-10-CM

## 2014-07-16 MED ORDER — GADOBENATE DIMEGLUMINE 529 MG/ML IV SOLN
10.0000 mL | Freq: Once | INTRAVENOUS | Status: AC | PRN
  Administered 2014-07-16: 10 mL via INTRAVENOUS

## 2014-07-27 ENCOUNTER — Other Ambulatory Visit (HOSPITAL_COMMUNITY): Payer: Self-pay | Admitting: Neurosurgery

## 2014-08-06 NOTE — Pre-Procedure Instructions (Signed)
EVANNA WASHINTON  08/06/2014   Your procedure is scheduled on:  Mon, April 18 @ 7:30 AM  Report to [facility]  at 5:30 AM.  Call this number if you have problems the morning of surgery: [phone]   Remember:   Do not eat food or drink liquids after midnight.   Take these medicines the morning of surgery with A SIP OF WATER: Xanax(Alprazolam),Atenolol(Tenormin),Eye Drops,Uloric(Febuxostat),and Synthroid(Levothyroxine)              No Goody's,BC's,Aleve,Aspirin,Ibuprofen,Fish Oil,or any Herbal Medications.    Do not wear jewelry, make-up or nail polish.  Do not wear lotions, powders, or perfumes. You may wear deodorant.  Do not shave 48 hours prior to surgery.   Do not bring valuables to the hospital.  [facility] is not responsible                  for any belongings or valuables.               Contacts, dentures or bridgework may not be worn into surgery.  Leave suitcase in the car. After surgery it may be brought to your room.  For patients admitted to the hospital, discharge time is determined by your                treatment team.              [facility] - Preparing for Surgery  Before surgery, you can play an important role.  Because skin is not sterile, your skin needs to be as free of germs as possible.  You can reduce the number of germs on you skin by washing with CHG (chlorahexidine gluconate) soap before surgery.  CHG is an antiseptic cleaner which kills germs and bonds with the skin to continue killing germs even after washing.  Please DO NOT use if you have an allergy to CHG or antibacterial soaps.  If your skin becomes reddened/irritated stop using the CHG and inform your nurse when you arrive at Short Stay.  Do not shave (including legs and underarms) for at least 48 hours prior to the first CHG shower.  You may shave your face.  Please follow these instructions carefully:   1.  Shower with CHG Soap the night before surgery and the                                 morning of Surgery.  2.  If you choose to wash your hair, wash your hair first as usual with your       normal shampoo.  3.  After you shampoo, rinse your hair and body thoroughly to remove the                      Shampoo.  4.  Use CHG as you would any other liquid soap.  You can apply chg directly       to the skin and wash gently with scrungie or a clean washcloth.  5.  Apply the CHG Soap to your body ONLY FROM THE NECK DOWN.        Do not use on open wounds or open sores.  Avoid contact with your eyes,       ears, mouth and genitals (private parts).  Wash genitals (private parts)       with your normal soap.  6.  Wash thoroughly,  paying special attention to the area where your surgery        will be performed.  7.  Thoroughly rinse your body with warm water from the neck down.  8.  DO NOT shower/wash with your normal soap after using and rinsing off       the CHG Soap.  9.  Pat yourself dry with a clean towel.            10.  Wear clean pajamas.            11.  Place clean sheets on your bed the night of your first shower and do not        sleep with pets.  Day of Surgery  Do not apply any lotions/deoderants the morning of surgery.  Please wear clean clothes to the hospital/surgery center.        Please read over the following fact sheets that you were given: Pain Booklet, Coughing and Deep Breathing, Blood Transfusion Information, MRSA Information and Surgical Site Infection Prevention

## 2014-08-09 ENCOUNTER — Encounter (HOSPITAL_COMMUNITY): Payer: Self-pay

## 2014-08-09 ENCOUNTER — Encounter (HOSPITAL_COMMUNITY)
Admission: RE | Admit: 2014-08-09 | Discharge: 2014-08-09 | Disposition: A | Discharge status: Home / Self Care | Payer: Medicare Other | Source: Ambulatory Visit | Attending: Anesthesiology | Admitting: Anesthesiology

## 2014-08-09 ENCOUNTER — Encounter (HOSPITAL_COMMUNITY)
Admission: RE | Admit: 2014-08-09 | Discharge: 2014-08-09 | Disposition: A | Discharge status: Home / Self Care | Payer: Medicare Other | Source: Ambulatory Visit | Attending: Neurosurgery | Admitting: Neurosurgery

## 2014-08-09 DIAGNOSIS — Z01818 Encounter for other preprocedural examination: Secondary | ICD-10-CM | POA: Insufficient documentation

## 2014-08-09 DIAGNOSIS — J40 Bronchitis, not specified as acute or chronic: Secondary | ICD-10-CM

## 2014-08-09 HISTORY — DX: Hypothyroidism, unspecified: E03.9

## 2014-08-09 HISTORY — DX: Chronic kidney disease, unspecified: N18.9

## 2014-08-09 HISTORY — DX: Stress incontinence (female) (male): N39.3

## 2014-08-09 HISTORY — DX: Dorsalgia, unspecified: M54.9

## 2014-08-09 HISTORY — DX: Anemia, unspecified: D64.9

## 2014-08-09 HISTORY — DX: Personal history of other medical treatment: Z92.89

## 2014-08-09 HISTORY — DX: Other chronic pain: G89.29

## 2014-08-09 HISTORY — DX: Bronchitis, not specified as acute or chronic: J40

## 2014-08-09 HISTORY — DX: Gout, unspecified: M10.9

## 2014-08-09 HISTORY — DX: Personal history of colon polyps, unspecified: Z86.0100

## 2014-08-09 HISTORY — DX: Weakness: R53.1

## 2014-08-09 HISTORY — DX: Personal history of colonic polyps: Z86.010

## 2014-08-09 HISTORY — DX: Pneumonia, unspecified organism: J18.9

## 2014-08-09 LAB — BASIC METABOLIC PANEL
ANION GAP: 13 (ref 5–15)
BUN: 29 mg/dL — ABNORMAL HIGH (ref 6–23)
CALCIUM: 9.8 mg/dL (ref 8.4–10.5)
CO2: 23 mmol/L (ref 19–32)
Chloride: 102 mmol/L (ref 96–112)
Creatinine, Ser: 1.36 mg/dL — ABNORMAL HIGH (ref 0.50–1.10)
GFR calc Af Amer: 46 mL/min — ABNORMAL LOW (ref >90)
GFR, EST NON AFRICAN AMERICAN: 39 mL/min — AB (ref >90)
Glucose, Bld: 82 mg/dL (ref 70–99)
Potassium: 3.8 mmol/L (ref 3.5–5.1)
SODIUM: 138 mmol/L (ref 135–145)

## 2014-08-09 LAB — CBC
HEMATOCRIT: 37.9 % (ref 36.0–46.0)
Hemoglobin: 12 g/dL (ref 12.0–15.0)
MCH: 26.6 pg (ref 26.0–34.0)
MCHC: 31.7 g/dL (ref 30.0–36.0)
MCV: 84 fL (ref 78.0–100.0)
Platelets: 273 10*3/uL (ref 150–400)
RBC: 4.51 MIL/uL (ref 3.87–5.11)
RDW: 14 % (ref 11.5–15.5)
WBC: 5.6 10*3/uL (ref 4.0–10.5)

## 2014-08-09 LAB — TYPE AND SCREEN
ABO/RH(D): O POS
Antibody Screen: NEGATIVE

## 2014-08-09 LAB — ABO/RH: ABO/RH(D): O POS

## 2014-08-09 LAB — SURGICAL PCR SCREEN
MRSA, PCR: NEGATIVE
Staphylococcus aureus: NEGATIVE

## 2014-08-09 NOTE — Progress Notes (Addendum)
Cardiologist is Dr.Henry Tamala Julian with clearance note in epic from 05/2014  Stress test report in epic from 2006  EKG in epic from 06-21-14  Medical Md is [place]  Heart cath > 51yrs ago  Denies ever having an echo  Denies CXR in past yr

## 2014-08-09 NOTE — Progress Notes (Signed)
Fasting blood sugar runs 80-90's

## 2014-08-15 MED ORDER — CEFAZOLIN SODIUM-DEXTROSE 2-3 GM-% IV SOLR
2.0000 g | INTRAVENOUS | Status: AC
  Administered 2014-08-16 (×2): 2 g via INTRAVENOUS
  Filled 2014-08-15: qty 50

## 2014-08-16 ENCOUNTER — Inpatient Hospital Stay (HOSPITAL_COMMUNITY): Payer: Medicare Other | Admitting: Anesthesiology

## 2014-08-16 ENCOUNTER — Encounter (HOSPITAL_COMMUNITY): Payer: Self-pay | Admitting: *Deleted

## 2014-08-16 ENCOUNTER — Inpatient Hospital Stay (HOSPITAL_COMMUNITY): Payer: Medicare Other

## 2014-08-16 ENCOUNTER — Encounter (HOSPITAL_COMMUNITY)
Admission: RE | Disposition: A | Discharge status: Home / Self Care | Payer: Self-pay | Source: Ambulatory Visit | Attending: Neurosurgery

## 2014-08-16 ENCOUNTER — Inpatient Hospital Stay (HOSPITAL_COMMUNITY)
Admission: RE | Admit: 2014-08-16 | Discharge: 2014-08-18 | DRG: 460 | Disposition: A | Discharge status: Home / Self Care | Payer: Medicare Other | Source: Ambulatory Visit | Attending: Neurosurgery | Admitting: Neurosurgery

## 2014-08-16 DIAGNOSIS — M47896 Other spondylosis, lumbar region: Secondary | ICD-10-CM | POA: Diagnosis present

## 2014-08-16 DIAGNOSIS — M545 Low back pain: Secondary | ICD-10-CM | POA: Diagnosis present

## 2014-08-16 DIAGNOSIS — F419 Anxiety disorder, unspecified: Secondary | ICD-10-CM | POA: Diagnosis present

## 2014-08-16 DIAGNOSIS — I456 Pre-excitation syndrome: Secondary | ICD-10-CM | POA: Diagnosis present

## 2014-08-16 DIAGNOSIS — M48062 Spinal stenosis, lumbar region with neurogenic claudication: Secondary | ICD-10-CM | POA: Diagnosis present

## 2014-08-16 DIAGNOSIS — E119 Type 2 diabetes mellitus without complications: Secondary | ICD-10-CM | POA: Diagnosis present

## 2014-08-16 DIAGNOSIS — E039 Hypothyroidism, unspecified: Secondary | ICD-10-CM | POA: Diagnosis present

## 2014-08-16 DIAGNOSIS — Z419 Encounter for procedure for purposes other than remedying health state, unspecified: Secondary | ICD-10-CM

## 2014-08-16 DIAGNOSIS — Z87891 Personal history of nicotine dependence: Secondary | ICD-10-CM

## 2014-08-16 DIAGNOSIS — Z79899 Other long term (current) drug therapy: Secondary | ICD-10-CM

## 2014-08-16 DIAGNOSIS — M109 Gout, unspecified: Secondary | ICD-10-CM | POA: Diagnosis present

## 2014-08-16 DIAGNOSIS — Z981 Arthrodesis status: Secondary | ICD-10-CM

## 2014-08-16 DIAGNOSIS — M4316 Spondylolisthesis, lumbar region: Secondary | ICD-10-CM | POA: Diagnosis present

## 2014-08-16 DIAGNOSIS — N183 Chronic kidney disease, stage 3 (moderate): Secondary | ICD-10-CM | POA: Diagnosis not present

## 2014-08-16 DIAGNOSIS — M5116 Intervertebral disc disorders with radiculopathy, lumbar region: Secondary | ICD-10-CM | POA: Diagnosis present

## 2014-08-16 DIAGNOSIS — M4806 Spinal stenosis, lumbar region: Secondary | ICD-10-CM | POA: Diagnosis present

## 2014-08-16 DIAGNOSIS — M48061 Spinal stenosis, lumbar region without neurogenic claudication: Secondary | ICD-10-CM

## 2014-08-16 DIAGNOSIS — I129 Hypertensive chronic kidney disease with stage 1 through stage 4 chronic kidney disease, or unspecified chronic kidney disease: Secondary | ICD-10-CM | POA: Diagnosis present

## 2014-08-16 DIAGNOSIS — H409 Unspecified glaucoma: Secondary | ICD-10-CM | POA: Diagnosis present

## 2014-08-16 DIAGNOSIS — Z96662 Presence of left artificial ankle joint: Secondary | ICD-10-CM | POA: Diagnosis present

## 2014-08-16 HISTORY — PX: BACK SURGERY: SHX140

## 2014-08-16 LAB — GLUCOSE, CAPILLARY
Glucose-Capillary: 115 mg/dL — ABNORMAL HIGH (ref 70–99)
Glucose-Capillary: 133 mg/dL — ABNORMAL HIGH (ref 70–99)
Glucose-Capillary: 130 mg/dL — ABNORMAL HIGH (ref 70–99)
Glucose-Capillary: 130 mg/dL — ABNORMAL HIGH (ref 70–99)

## 2014-08-16 SURGERY — POSTERIOR LUMBAR FUSION 2 LEVEL
Anesthesia: General | Site: Back

## 2014-08-16 MED ORDER — ACETAMINOPHEN 10 MG/ML IV SOLN
INTRAVENOUS | Status: AC
  Administered 2014-08-16: 1000 mg via INTRAVENOUS
  Filled 2014-08-16: qty 100

## 2014-08-16 MED ORDER — KETOROLAC TROMETHAMINE 30 MG/ML IJ SOLN
30.0000 mg | Freq: Four times a day (QID) | INTRAMUSCULAR | Status: AC
  Administered 2014-08-16 – 2014-08-18 (×8): 30 mg via INTRAVENOUS
  Filled 2014-08-16 (×7): qty 1

## 2014-08-16 MED ORDER — LIDOCAINE-EPINEPHRINE 1 %-1:100000 IJ SOLN
INTRAMUSCULAR | Status: DC | PRN
  Administered 2014-08-16: 15 mL

## 2014-08-16 MED ORDER — SODIUM CHLORIDE 0.9 % IJ SOLN
INTRAMUSCULAR | Status: AC
  Filled 2014-08-16: qty 10

## 2014-08-16 MED ORDER — GELATIN ABSORBABLE MT POWD
OROMUCOSAL | Status: DC | PRN
  Administered 2014-08-16: 10:00:00 via TOPICAL

## 2014-08-16 MED ORDER — ATENOLOL 25 MG PO TABS
25.0000 mg | ORAL_TABLET | Freq: Every day | ORAL | Status: DC
Start: 2014-08-17 — End: 2014-08-18
  Filled 2014-08-16 (×2): qty 1

## 2014-08-16 MED ORDER — PROMETHAZINE HCL 25 MG/ML IJ SOLN
6.2500 mg | INTRAMUSCULAR | Status: DC | PRN
Start: 2014-08-16 — End: 2014-08-16

## 2014-08-16 MED ORDER — POTASSIUM CHLORIDE IN NACL 20-0.9 MEQ/L-% IV SOLN
INTRAVENOUS | Status: DC
  Filled 2014-08-16 (×7): qty 1000

## 2014-08-16 MED ORDER — DEXAMETHASONE SODIUM PHOSPHATE 4 MG/ML IJ SOLN
INTRAMUSCULAR | Status: DC | PRN
  Administered 2014-08-16: 8 mg via INTRAVENOUS

## 2014-08-16 MED ORDER — KETOROLAC TROMETHAMINE 30 MG/ML IJ SOLN: 30.0000 mg | Freq: Once | INTRAMUSCULAR | Status: DC

## 2014-08-16 MED ORDER — EXENATIDE 10 MCG/0.04ML ~~LOC~~ SOPN: 10.0000 ug | PEN_INJECTOR | Freq: Two times a day (BID) | SUBCUTANEOUS | Status: DC

## 2014-08-16 MED ORDER — SODIUM CHLORIDE 0.9 % IJ SOLN: 3.0000 mL | INTRAMUSCULAR | Status: DC | PRN

## 2014-08-16 MED ORDER — PHENYLEPHRINE HCL 10 MG/ML IJ SOLN
INTRAMUSCULAR | Status: DC | PRN
  Administered 2014-08-16: 160 ug via INTRAVENOUS
  Administered 2014-08-16 (×2): 80 ug via INTRAVENOUS

## 2014-08-16 MED ORDER — NEOSTIGMINE METHYLSULFATE 10 MG/10ML IV SOLN
INTRAVENOUS | Status: AC
  Filled 2014-08-16: qty 1

## 2014-08-16 MED ORDER — ACETAMINOPHEN 325 MG PO TABS: 650.0000 mg | ORAL_TABLET | ORAL | Status: DC | PRN

## 2014-08-16 MED ORDER — ACETAMINOPHEN 650 MG RE SUPP: 650.0000 mg | RECTAL | Status: DC | PRN

## 2014-08-16 MED ORDER — SCOPOLAMINE 1 MG/3DAYS TD PT72
MEDICATED_PATCH | TRANSDERMAL | Status: AC
  Administered 2014-08-16: 1 via TRANSDERMAL
  Filled 2014-08-16: qty 1

## 2014-08-16 MED ORDER — NEOSTIGMINE METHYLSULFATE 10 MG/10ML IV SOLN
INTRAVENOUS | Status: DC | PRN
Start: 2014-08-16 — End: 2014-08-16
  Administered 2014-08-16: 4 mg via INTRAVENOUS

## 2014-08-16 MED ORDER — ROCURONIUM BROMIDE 100 MG/10ML IV SOLN
INTRAVENOUS | Status: DC | PRN
  Administered 2014-08-16: 50 mg via INTRAVENOUS
  Administered 2014-08-16: 5 mg via INTRAVENOUS
  Administered 2014-08-16: 10 mg via INTRAVENOUS
  Administered 2014-08-16: 5 mg via INTRAVENOUS

## 2014-08-16 MED ORDER — LIDOCAINE HCL (CARDIAC) 20 MG/ML IV SOLN
INTRAVENOUS | Status: DC | PRN
  Administered 2014-08-16: 80 mg via INTRAVENOUS

## 2014-08-16 MED ORDER — DEXAMETHASONE SODIUM PHOSPHATE 4 MG/ML IJ SOLN
INTRAMUSCULAR | Status: AC
  Filled 2014-08-16: qty 2

## 2014-08-16 MED ORDER — BISACODYL 10 MG RE SUPP: 10.0000 mg | Freq: Every day | RECTAL | Status: DC | PRN

## 2014-08-16 MED ORDER — ROCURONIUM BROMIDE 50 MG/5ML IV SOLN
INTRAVENOUS | Status: AC
  Filled 2014-08-16: qty 1

## 2014-08-16 MED ORDER — SODIUM CHLORIDE 0.9 % IV SOLN: 250.0000 mL | INTRAVENOUS | Status: DC

## 2014-08-16 MED ORDER — MENTHOL 3 MG MT LOZG
1.0000 | LOZENGE | OROMUCOSAL | Status: DC | PRN
  Filled 2014-08-16: qty 9

## 2014-08-16 MED ORDER — ONDANSETRON HCL 4 MG/2ML IJ SOLN
INTRAMUSCULAR | Status: AC
  Filled 2014-08-16: qty 2

## 2014-08-16 MED ORDER — LIDOCAINE HCL (CARDIAC) 20 MG/ML IV SOLN
INTRAVENOUS | Status: AC
  Filled 2014-08-16: qty 5

## 2014-08-16 MED ORDER — EPHEDRINE SULFATE 50 MG/ML IJ SOLN
INTRAMUSCULAR | Status: AC
  Filled 2014-08-16: qty 1

## 2014-08-16 MED ORDER — SCOPOLAMINE 1 MG/3DAYS TD PT72
MEDICATED_PATCH | TRANSDERMAL | Status: AC
  Filled 2014-08-16: qty 1

## 2014-08-16 MED ORDER — THROMBIN 20000 UNITS EX SOLR
CUTANEOUS | Status: DC | PRN
  Administered 2014-08-16 (×2): via TOPICAL

## 2014-08-16 MED ORDER — SPIRONOLACTONE 25 MG PO TABS
25.0000 mg | ORAL_TABLET | Freq: Every day | ORAL | Status: DC
  Administered 2014-08-17 – 2014-08-18 (×2): 25 mg via ORAL
  Filled 2014-08-16 (×2): qty 1

## 2014-08-16 MED ORDER — BUPIVACAINE HCL (PF) 0.5 % IJ SOLN
INTRAMUSCULAR | Status: DC | PRN
  Administered 2014-08-16: 15 mL

## 2014-08-16 MED ORDER — GLYCOPYRROLATE 0.2 MG/ML IJ SOLN
INTRAMUSCULAR | Status: AC
  Filled 2014-08-16: qty 2

## 2014-08-16 MED ORDER — HYDROMORPHONE HCL 1 MG/ML IJ SOLN
INTRAMUSCULAR | Status: AC
  Administered 2014-08-16: 0.5 mg via INTRAVENOUS
  Filled 2014-08-16: qty 1

## 2014-08-16 MED ORDER — CANAGLIFLOZIN 300 MG PO TABS
300.0000 mg | ORAL_TABLET | Freq: Every day | ORAL | Status: DC
  Administered 2014-08-17 – 2014-08-18 (×2): 300 mg via ORAL
  Filled 2014-08-16 (×3): qty 300

## 2014-08-16 MED ORDER — FENTANYL CITRATE (PF) 100 MCG/2ML IJ SOLN
INTRAMUSCULAR | Status: DC | PRN
  Administered 2014-08-16: 50 ug via INTRAVENOUS
  Administered 2014-08-16: 100 ug via INTRAVENOUS
  Administered 2014-08-16 (×4): 50 ug via INTRAVENOUS

## 2014-08-16 MED ORDER — HYDROCODONE-ACETAMINOPHEN 5-325 MG PO TABS: 1.0000 | ORAL_TABLET | ORAL | Status: DC | PRN

## 2014-08-16 MED ORDER — MORPHINE SULFATE 4 MG/ML IJ SOLN
4.0000 mg | INTRAMUSCULAR | Status: DC | PRN
  Administered 2014-08-16 – 2014-08-17 (×2): 4 mg via INTRAMUSCULAR
  Filled 2014-08-16 (×2): qty 1

## 2014-08-16 MED ORDER — ALBUMIN HUMAN 5 % IV SOLN
INTRAVENOUS | Status: DC | PRN
  Administered 2014-08-16 (×2): via INTRAVENOUS

## 2014-08-16 MED ORDER — FENTANYL CITRATE (PF) 250 MCG/5ML IJ SOLN
INTRAMUSCULAR | Status: AC
Start: 2014-08-16 — End: 2014-08-16
  Filled 2014-08-16: qty 5

## 2014-08-16 MED ORDER — GLYCOPYRROLATE 0.2 MG/ML IJ SOLN
INTRAMUSCULAR | Status: AC
  Filled 2014-08-16: qty 3

## 2014-08-16 MED ORDER — SODIUM CHLORIDE 0.9 % IR SOLN
Status: DC | PRN
  Administered 2014-08-16 (×2)

## 2014-08-16 MED ORDER — PHENYLEPHRINE 40 MCG/ML (10ML) SYRINGE FOR IV PUSH (FOR BLOOD PRESSURE SUPPORT)
PREFILLED_SYRINGE | INTRAVENOUS | Status: AC
  Filled 2014-08-16: qty 10

## 2014-08-16 MED ORDER — PROPOFOL 10 MG/ML IV BOLUS
INTRAVENOUS | Status: DC | PRN
  Administered 2014-08-16: 150 mg via INTRAVENOUS

## 2014-08-16 MED ORDER — FENTANYL CITRATE (PF) 250 MCG/5ML IJ SOLN
INTRAMUSCULAR | Status: AC
  Filled 2014-08-16: qty 5

## 2014-08-16 MED ORDER — HYDROXYZINE HCL 50 MG/ML IM SOLN
50.0000 mg | INTRAMUSCULAR | Status: DC | PRN
  Filled 2014-08-16: qty 1

## 2014-08-16 MED ORDER — GLYCOPYRROLATE 0.2 MG/ML IJ SOLN
INTRAMUSCULAR | Status: DC | PRN
  Administered 2014-08-16: 0.4 mg via INTRAVENOUS
  Administered 2014-08-16: .6 mg via INTRAVENOUS

## 2014-08-16 MED ORDER — LACTATED RINGERS IV SOLN
INTRAVENOUS | Status: DC | PRN
  Administered 2014-08-16 (×3): via INTRAVENOUS

## 2014-08-16 MED ORDER — MIDAZOLAM HCL 5 MG/5ML IJ SOLN
INTRAMUSCULAR | Status: DC | PRN
  Administered 2014-08-16: 2 mg via INTRAVENOUS

## 2014-08-16 MED ORDER — PROPOFOL 10 MG/ML IV BOLUS
INTRAVENOUS | Status: AC
  Filled 2014-08-16: qty 20

## 2014-08-16 MED ORDER — SODIUM CHLORIDE 0.9 % IJ SOLN
3.0000 mL | Freq: Two times a day (BID) | INTRAMUSCULAR | Status: DC
  Administered 2014-08-16 – 2014-08-17 (×2): 3 mL via INTRAVENOUS

## 2014-08-16 MED ORDER — PROMETHAZINE HCL 25 MG/ML IJ SOLN: 6.2500 mg | INTRAMUSCULAR | Status: DC | PRN

## 2014-08-16 MED ORDER — ARTIFICIAL TEARS OP OINT
TOPICAL_OINTMENT | OPHTHALMIC | Status: AC
  Filled 2014-08-16: qty 3.5

## 2014-08-16 MED ORDER — FEBUXOSTAT 40 MG PO TABS
40.0000 mg | ORAL_TABLET | Freq: Every day | ORAL | Status: DC
Start: 2014-08-17 — End: 2014-08-18
  Administered 2014-08-17 – 2014-08-18 (×2): 40 mg via ORAL
  Filled 2014-08-16 (×2): qty 1

## 2014-08-16 MED ORDER — HYDROMORPHONE HCL 1 MG/ML IJ SOLN: 0.2500 mg | INTRAMUSCULAR | Status: DC | PRN

## 2014-08-16 MED ORDER — ARTIFICIAL TEARS OP OINT
TOPICAL_OINTMENT | OPHTHALMIC | Status: DC | PRN
  Administered 2014-08-16: 1 via OPHTHALMIC

## 2014-08-16 MED ORDER — SUCCINYLCHOLINE CHLORIDE 20 MG/ML IJ SOLN
INTRAMUSCULAR | Status: AC
  Filled 2014-08-16: qty 1

## 2014-08-16 MED ORDER — ALPRAZOLAM 0.5 MG PO TABS: 0.5000 mg | ORAL_TABLET | Freq: Three times a day (TID) | ORAL | Status: DC | PRN

## 2014-08-16 MED ORDER — ALUM & MAG HYDROXIDE-SIMETH 200-200-20 MG/5ML PO SUSP: 30.0000 mL | Freq: Four times a day (QID) | ORAL | Status: DC | PRN

## 2014-08-16 MED ORDER — HYDROMORPHONE HCL 2 MG PO TABS
2.0000 mg | ORAL_TABLET | ORAL | Status: DC | PRN
  Administered 2014-08-17: 4 mg via ORAL
  Filled 2014-08-16: qty 2

## 2014-08-16 MED ORDER — 0.9 % SODIUM CHLORIDE (POUR BTL) OPTIME
TOPICAL | Status: DC | PRN
  Administered 2014-08-16 (×3): 1000 mL

## 2014-08-16 MED ORDER — MIDAZOLAM HCL 2 MG/2ML IJ SOLN
INTRAMUSCULAR | Status: AC
  Filled 2014-08-16: qty 2

## 2014-08-16 MED ORDER — HYDROXYZINE HCL 25 MG PO TABS: 50.0000 mg | ORAL_TABLET | ORAL | Status: DC | PRN

## 2014-08-16 MED ORDER — PHENOL 1.4 % MT LIQD: 1.0000 | OROMUCOSAL | Status: DC | PRN

## 2014-08-16 MED ORDER — HYDROMORPHONE HCL 1 MG/ML IJ SOLN
0.2500 mg | INTRAMUSCULAR | Status: DC | PRN
  Administered 2014-08-16 (×2): 0.5 mg via INTRAVENOUS

## 2014-08-16 MED ORDER — HYDROCHLOROTHIAZIDE 12.5 MG PO CAPS
12.5000 mg | ORAL_CAPSULE | Freq: Every day | ORAL | Status: DC
  Administered 2014-08-17 – 2014-08-18 (×2): 12.5 mg via ORAL
  Filled 2014-08-16 (×2): qty 1

## 2014-08-16 MED ORDER — BRIMONIDINE TARTRATE 0.2 % OP SOLN
1.0000 [drp] | Freq: Two times a day (BID) | OPHTHALMIC | Status: DC
  Filled 2014-08-16: qty 5

## 2014-08-16 MED ORDER — MAGNESIUM HYDROXIDE 400 MG/5ML PO SUSP: 30.0000 mL | Freq: Every day | ORAL | Status: DC | PRN

## 2014-08-16 MED ORDER — LEVOTHYROXINE SODIUM 50 MCG PO TABS
50.0000 ug | ORAL_TABLET | ORAL | Status: DC
  Administered 2014-08-17 – 2014-08-18 (×2): 50 ug via ORAL
  Filled 2014-08-16 (×3): qty 1

## 2014-08-16 MED ORDER — LATANOPROST 0.005 % OP SOLN
1.0000 [drp] | Freq: Every day | OPHTHALMIC | Status: DC
  Administered 2014-08-16: 1 [drp] via OPHTHALMIC
  Filled 2014-08-16: qty 2.5

## 2014-08-16 MED ORDER — ONDANSETRON HCL 4 MG/2ML IJ SOLN
4.0000 mg | Freq: Four times a day (QID) | INTRAMUSCULAR | Status: DC | PRN
  Administered 2014-08-18: 4 mg via INTRAVENOUS
  Filled 2014-08-16: qty 2

## 2014-08-16 MED ORDER — CEFAZOLIN SODIUM-DEXTROSE 2-3 GM-% IV SOLR
INTRAVENOUS | Status: AC
  Filled 2014-08-16: qty 50

## 2014-08-16 MED ORDER — PHENYLEPHRINE HCL 10 MG/ML IJ SOLN
10.0000 mg | INTRAMUSCULAR | Status: DC | PRN
  Administered 2014-08-16: 14:00:00 via INTRAVENOUS
  Administered 2014-08-16: 20 ug/min via INTRAVENOUS

## 2014-08-16 MED ORDER — KETOROLAC TROMETHAMINE 30 MG/ML IJ SOLN
INTRAMUSCULAR | Status: AC
  Filled 2014-08-16: qty 1

## 2014-08-16 MED ORDER — CYCLOBENZAPRINE HCL 10 MG PO TABS: 10.0000 mg | ORAL_TABLET | Freq: Three times a day (TID) | ORAL | Status: DC | PRN

## 2014-08-16 SURGICAL SUPPLY — 84 items
ADH SKN CLS APL DERMABOND .7 (GAUZE/BANDAGES/DRESSINGS) ×2
APL SKNCLS STERI-STRIP NONHPOA (GAUZE/BANDAGES/DRESSINGS) ×1
BAG DECANTER FOR FLEXI CONT (MISCELLANEOUS) ×2 IMPLANT
BENZOIN TINCTURE PRP APPL 2/3 (GAUZE/BANDAGES/DRESSINGS) ×2 IMPLANT
BLADE CLIPPER SURG (BLADE) IMPLANT
BRUSH SCRUB EZ PLAIN DRY (MISCELLANEOUS) ×2 IMPLANT
BUR ACRON 5.0MM COATED (BURR) ×4 IMPLANT
BUR MATCHSTICK NEURO 3.0 LAGG (BURR) ×2 IMPLANT
CANISTER SUCT 3000ML PPV (MISCELLANEOUS) ×2 IMPLANT
CAP LCK SPNE (Orthopedic Implant) ×4 IMPLANT
CAP LOCK SPINE RADIUS (Orthopedic Implant) ×4 IMPLANT
CAP LOCKING (Orthopedic Implant) ×8 IMPLANT
CAP LOCKING 90D (Cap) ×8 IMPLANT
CONT SPEC 4OZ CLIKSEAL STRL BL (MISCELLANEOUS) ×4 IMPLANT
COVER BACK TABLE 60X90IN (DRAPES) ×2 IMPLANT
CROSSLINK MEDIUM (Orthopedic Implant) ×2 IMPLANT
DERMABOND ADVANCED (GAUZE/BANDAGES/DRESSINGS) ×2
DERMABOND ADVANCED .7 DNX12 (GAUZE/BANDAGES/DRESSINGS) ×2 IMPLANT
DRAPE C-ARM 42X72 X-RAY (DRAPES) ×4 IMPLANT
DRAPE LAPAROTOMY 100X72X124 (DRAPES) ×2 IMPLANT
DRAPE POUCH INSTRU U-SHP 10X18 (DRAPES) ×2 IMPLANT
DRAPE PROXIMA HALF (DRAPES) ×2 IMPLANT
DRSG EMULSION OIL 3X3 NADH (GAUZE/BANDAGES/DRESSINGS) IMPLANT
ELECT REM PT RETURN 9FT ADLT (ELECTROSURGICAL) ×2
ELECTRODE REM PT RTRN 9FT ADLT (ELECTROSURGICAL) ×1 IMPLANT
GAUZE SPONGE 4X4 12PLY STRL (GAUZE/BANDAGES/DRESSINGS) ×2 IMPLANT
GAUZE SPONGE 4X4 16PLY XRAY LF (GAUZE/BANDAGES/DRESSINGS) ×2 IMPLANT
GLOVE BIO SURGEON STRL SZ8 (GLOVE) ×2 IMPLANT
GLOVE BIOGEL PI IND STRL 8 (GLOVE) ×5 IMPLANT
GLOVE BIOGEL PI INDICATOR 8 (GLOVE) ×5
GLOVE ECLIPSE 7.5 STRL STRAW (GLOVE) ×12 IMPLANT
GLOVE SS BIOGEL STRL SZ 6.5 (GLOVE) ×1 IMPLANT
GLOVE SS BIOGEL STRL SZ 8.5 (GLOVE) ×1 IMPLANT
GLOVE SUPERSENSE BIOGEL SZ 6.5 (GLOVE) ×1
GLOVE SUPERSENSE BIOGEL SZ 8.5 (GLOVE) ×1
GOWN STRL REUS W/ TWL LRG LVL3 (GOWN DISPOSABLE) IMPLANT
GOWN STRL REUS W/ TWL XL LVL3 (GOWN DISPOSABLE) ×4 IMPLANT
GOWN STRL REUS W/TWL 2XL LVL3 (GOWN DISPOSABLE) ×4 IMPLANT
GOWN STRL REUS W/TWL LRG LVL3 (GOWN DISPOSABLE)
GOWN STRL REUS W/TWL XL LVL3 (GOWN DISPOSABLE) ×8
HEMOSTAT POWDER KIT SURGIFOAM (HEMOSTASIS) ×2 IMPLANT
KIT BASIN OR (CUSTOM PROCEDURE TRAY) ×2 IMPLANT
KIT INFUSE MEDIUM (Orthopedic Implant) ×2 IMPLANT
KIT ROOM TURNOVER OR (KITS) ×2 IMPLANT
LIQUID BAND (GAUZE/BANDAGES/DRESSINGS) IMPLANT
MILL MEDIUM DISP (BLADE) IMPLANT
NEEDLE 18GX1X1/2 (RX/OR ONLY) (NEEDLE) ×2 IMPLANT
NEEDLE ASPIRATION RAN815N 8X15 (NEEDLE) ×1 IMPLANT
NEEDLE ASPIRATION RANFAC 8X15 (NEEDLE) ×1
NEEDLE BONE MARROW 8GAX6 (NEEDLE) IMPLANT
NEEDLE SPNL 18GX3.5 QUINCKE PK (NEEDLE) ×2 IMPLANT
NEEDLE SPNL 22GX3.5 QUINCKE BK (NEEDLE) ×2 IMPLANT
NS IRRIG 1000ML POUR BTL (IV SOLUTION) ×2 IMPLANT
PACK LAMINECTOMY NEURO (CUSTOM PROCEDURE TRAY) ×2 IMPLANT
PAD ARMBOARD 7.5X6 YLW CONV (MISCELLANEOUS) ×8 IMPLANT
PATTIES SURGICAL .5 X.5 (GAUZE/BANDAGES/DRESSINGS) ×2 IMPLANT
PATTIES SURGICAL .5 X1 (DISPOSABLE) IMPLANT
PATTIES SURGICAL 1X1 (DISPOSABLE) ×2 IMPLANT
PEEK PLIF AVS 11X25X4 (Peek) ×8 IMPLANT
ROD 100MM (Rod) ×2 IMPLANT
ROD 90MM RADIUS (Rod) ×2 IMPLANT
ROD SPNL 100X5.5XNS TI RDS (Rod) ×1 IMPLANT
SCREW 5.75X40M (Screw) ×6 IMPLANT
SCREW 5.75X45MM (Screw) ×2 IMPLANT
SPONGE LAP 4X18 X RAY DECT (DISPOSABLE) ×2 IMPLANT
SPONGE NEURO XRAY DETECT 1X3 (DISPOSABLE) ×2 IMPLANT
SPONGE SURGIFOAM ABS GEL 100 (HEMOSTASIS) ×4 IMPLANT
STAPLER SKIN PROX WIDE 3.9 (STAPLE) IMPLANT
STRIP BIOACTIVE VITOSS 25X100X (Neuro Prosthesis/Implant) ×4 IMPLANT
STRIP CLOSURE SKIN 1/2X4 (GAUZE/BANDAGES/DRESSINGS) IMPLANT
SUT PROLENE 6 0 BV (SUTURE) IMPLANT
SUT VIC AB 1 CT1 18XBRD ANBCTR (SUTURE) ×2 IMPLANT
SUT VIC AB 1 CT1 8-18 (SUTURE) ×4
SUT VIC AB 2-0 CP2 18 (SUTURE) ×4 IMPLANT
SYR 20CC LL (SYRINGE) ×2 IMPLANT
SYR 3ML LL SCALE MARK (SYRINGE) ×8 IMPLANT
SYR 5ML LL (SYRINGE) IMPLANT
SYR CONTROL 10ML LL (SYRINGE) ×2 IMPLANT
TAPE CLOTH SURG 4X10 WHT LF (GAUZE/BANDAGES/DRESSINGS) ×2 IMPLANT
TOWEL OR 17X24 6PK STRL BLUE (TOWEL DISPOSABLE) ×2 IMPLANT
TOWEL OR 17X26 10 PK STRL BLUE (TOWEL DISPOSABLE) ×2 IMPLANT
TRAP SPECIMEN MUCOUS 40CC (MISCELLANEOUS) ×2 IMPLANT
TRAY FOLEY CATH 14FRSI W/METER (CATHETERS) ×2 IMPLANT
WATER STERILE IRR 1000ML POUR (IV SOLUTION) ×2 IMPLANT

## 2014-08-16 NOTE — Progress Notes (Signed)
Utilization review completed.  

## 2014-08-16 NOTE — Op Note (Signed)
08/16/2014  2:45 PM  PATIENT:  [name]  69 y.o. female  PRE-OPERATIVE DIAGNOSIS:  lumbar stenosis with neurogenic claudication, lumbar spondylosis, lumbar degenerative disc disease, right lumbar radiculopathy  POST-OPERATIVE DIAGNOSIS:  lumbar stenosis with neurogenic claudication, lumbar spondylosis, lumbar degenerative disc disease, right lumbar radiculopathy  PROCEDURE:  Procedure(s):  Exposure of existing L5-S1 90 D posterior instrumentation and removal of existing locking caps and rods; L3-L5 decompressive lumbar laminectomy, bilateral L3-4 and L4-5 facetectomy and foraminotomies with decompression of the stenotic compression of the exiting L3, L4, and L5 nerve roots bilaterally, with decompression beyond that required for interbody arthrodesis; bilateral L3-4 and L4-5 posterior lumbar interbody arthrodesis with AVS peek interbody implants, Vitoss BA with bone marrow aspirate, and infuse; bilateral L3-L5 posterior lateral arthrodesis with segmental radius posterior instrumentation that was tied into existing 90 D pedicle screws at the L5 and S1 level, locally harvested morcellized autograft, Vitoss BA with bone marrow aspirate, and infuse  SURGEON:  Surgeon(s): Jovita Gamma, MD Newman Pies, MD  ASSISTANTS: Newman Pies, M.D.  ANESTHESIA:   general  EBL:  Total I/O In: 2970 [I.V.:2200; Blood:270; IV Piggyback:500] Out: 8295 [Urine:825; Blood:650]  BLOOD ADMINISTERED:270 CC CELLSAVER  COUNT: Correct per nursing staff  DICTATION: Patient was brought to the operating room placed under general endotracheal anesthesia. The patient was turned to prone position, the lumbar region was prepped with Betadine soap and solution and draped in a sterile fashion. The midline was infiltrated with local anesthesia with epinephrine. Amidline incision was made, through the previous midline incision, and extended rostrally. It was carried down through the subcutaneous tissue, bipolar  cautery and electrocautery were used to maintain hemostasis. Dissection was carried down to the lumbar fascia. The fascia was incised bilaterally and the paraspinal muscles were dissected with a spinous process and lamina in a subperiosteal fashion. An x-ray was taken for localization and the L3-4 and L4-5 interlaminar levels were localized. Dissection was then carried out laterally over the facet complexes, the existing posterior instrumentation at L5-S1 was identified, and the transverse processes of L3, L4, and L5 were exposed and decorticated.   The bony overgrowth and scar tissue overlying the existing postreduction patient was carefully removed, and then the locking caps were removed from each of the 4 screws, and then the rods were removed bilaterally. The screw heads were mobilize, by removing excess bony overgrowth from around them.  We then proceeded with the decompression. A L3, L4, and L5 lumbar laminectomy was performed using double-action rongeurs, the high-speed drill, and Kerrison punches. There was marked little in the flavum thickening, L4-5 worse than L3-4, that was carefully removed, decompressing the spinal canal. Dissection was carried out laterally including facetectomy bilaterally at the L3-4 and L4-5 levels, and foraminotomies with decompression of the stenotic compression of the L3, L4, and L5 nerve roots. Once the decompression of the stenotic compression of the thecal sac and exiting nerve roots was completed we proceeded with the posterior lumbar interbody arthrodesis. The annulus at the L3-4 and L4-5 levels was incised bilaterally and the disc space entered. A thorough discectomy was performed using pituitary rongeurs and curettes. Once the discectomy was completed we began to prepare the endplate surfaces, removing the cartilaginous endplates surface with paddle curettes. We then measured the height of the intervertebral disc space. We selected 11 x 25 x 4 AVS peek interbody  implants for the L3-4 level, and 11 x 25 x 4 AVS peek interbody implants for the L4-5 level.  The C-arm fluoroscope  was then draped and brought in the field and we identified the pedicle entry points bilaterally at the L3 and L4 levels. Each of the 4 pedicles was probed, we aspirated bone marrow aspirate from the vertebral bodies, this was injected over 2 10 cc strips of Vitoss BA. Then each of the pedicles was examined with the ball probe, good bony surfaces were found and no bony cuts were found. Each of the pedicles was then tapped with a 5.25 mm tap, again examined with the ball probe good threading was found and no bony cuts were found. We then placed 5.75 by 40 millimeter screws bilaterally at the L3 level, 5.75 by 40 millimeter screw on the left at the L4 level, and 5.75 by 45 millimeter screw on the right at the L5 level.  We then packed the AVS peek interbody implants with Vitoss BA with bone marrow aspirate and infuse, and then placed the first implant at the L4-5 level on the right side, carefully retracting the thecal sac and nerve root medially. We then went back to the left side and packed the midline with additional Vitoss BA with bone marrow aspirate and infuse, and then placed a second implant on the left side again retracting the thecal sac and nerve root medially. Additional Vitoss BA with bone marrow aspirate and infuse was packed lateral to the implants.  Then at the L3-4 level, we placed the first implant on the right side, carefully retracting the thecal sac and nerve root medially. We then went back to the left side and packed the midline with additional Vitoss BA with bone marrow aspirate and infuse, and then placed a second implant on the left side, again retracting the thecal sac and nerve root medially. Additional Vitoss BA with bone marrow aspirate and infuse was packed lateral to the implants.   We then packed the lateral gutter over the transverse processes and intertransverse  space with locally harvested morcellized autograft, Vitoss BA with bone marrow aspirate, and infuse. We then selected 90 mm and 100 mm pre-lordosed rods.  Using [place] benders further contouring of the rod was performed, and then they were placed within the screw heads and secured with locking caps. We used radius locking caps for the radius screws, and 90 D locking caps for the 90 D screws.  Once all 8 locking caps were placed, final tightening was performed against corresponding counter torque (radius for radius screws and 90 D for 90 D screws). A cross-link was placed between the L4 and L5 screws, being secured down to the rods on each side, and then the central locking collar being tightened.  The wound had been irrigated multiple times during the procedure with saline solution and bacitracin solution, good hemostasis was established with a combination of bipolar cautery, Gelfoam with thrombin, and Surgifoam. Once good hemostasis was confirmed we proceeded with closure paraspinal muscles deep fascia and Scarpa's fascia were closed with interrupted undyed 1 Vicryl sutures the subcutaneous and subcuticular closed with interrupted inverted 2-0 undyed Vicryl sutures the skin edges were approximated with Dermabond. The wound was dressed with sterile gauze and Hypafix.  Following surgery the patient was turned back to the supine position to be reversed and the anesthetic extubated and transferred to the recovery room for further care.   PLAN OF CARE: Admit to inpatient   PATIENT DISPOSITION:  PACU - hemodynamically stable.   Delay start of Pharmacological VTE agent (>24hrs) due to surgical blood loss or risk of bleeding:  yes

## 2014-08-16 NOTE — H&P (Signed)
Subjective: Patient is a 69 y.o. right-handed black female who is admitted for treatment of progressive degeneration L4-5 worse than L3-4.  Patient is status post a previous L5-S1 lumbar decompression and arthrodesis in October 2005. She recovered well from that surgery, but scans over the past decade, and most recently last month showed progressive degeneration.  Symptomatically she's been having low back pain with pain and tingling through the right anterolateral thigh and leg consistent with radiculopathy. At L4-5 there is circumferential spondylitic disc herniation, facet ligamentum flavum hypertrophy, and resulting marked multifactorial canal stenosis, somewhat worse in the right than the left, with neural foraminal stenosis, again worse in the right than the left. There is a dynamic grade 1-2 degenerative spondylolisthesis at the L4-5 level. At L3-4 there is circumferential degenerative disc protrusion, with mild canal stenosis and mild facet degeneration. Patient is admitted now for an L3-L5 decompressive lumbar laminectomy, with bilateral facetectomy and foraminotomy, bilateral L3-4 and L4-5 posterior lumbar interbody arthrodesis with peek interbody implants and bone graft, and an L3-L5 posterior lateral arthrodesis with posterior instrumentation and bone graft.   Patient Active Problem List   Diagnosis Date Noted  . Right rotator cuff tear   . Anxiety   . PONV (postoperative nausea and vomiting)   . GERD (gastroesophageal reflux disease)   . Arthritis   . Thyroid disease   . DIVERTICULOSIS-COLON 07/04/2009  . ANEMIA, IRON DEFICIENCY 03/17/2009  . PERSONAL HX COLONIC POLYPS 03/17/2009  . SKIN RASH 02/23/2009  . DIARRHEA 02/23/2009  . FEVER, HX OF 02/23/2009  . THYROID NODULE, LEFT 02/11/2009  . GLAUCOMA 02/11/2009  . Coronary atherosclerosis 02/11/2009  . 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 GESTATIONAL DIABETES 02/11/2009  . DEGENERATIVE JOINT DISEASE, ANKLE  02/11/2009  . 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 Essential hypertension 02/09/2009   Past Medical History  Diagnosis Date  . Glaucoma     uses Eye Drops  . Arthritis   . PONV (postoperative nausea and vomiting)   . Wolf-Parkinson-White syndrome     takes Atenolol daily  . Anxiety     takes Xanax daily as needed  . Hypothyroidism     takes Synthroid daily  . Diabetes mellitus     takes Byetta and Invokana daily  . Gout     takes Uloric daily  . Hypertension     takes HCTZ daily as well as Aldactone  . Pneumonia     many yrs ago  . Bronchitis     a month ago  . Weakness     numbness and tingling in right hip/eg/  . Chronic back pain     stenosis  . Chronic kidney disease     stage 3-sees Dr.J Patel  . History of colon polyps   . Stress incontinence   . Anemia     hx of  . History of blood transfusion     no abnormal reaction     Past Surgical History  Procedure Laterality Date  . Thyroid surgery    . Abdominal hysterectomy    . Skin biopsy    . Back surgery  2006    lumb fusion  . Cholecystectomy  1981  . Knee arthroscopy  2009    rt  . Ankle arthroplasty  2010    lt  . Appendectomy    . Colonoscopy    . Shoulder arthroscopy with rotator cuff repair and subacromial decompression Right 06/10/2012    Procedure: SHOULDER ARTHROSCOPY WITH ROTATOR CUFF REPAIR  AND SUBACROMIAL DECOMPRESSION;  Surgeon: Lorn Junes, MD;  Location: [facility];  Service: Orthopedics;  Laterality: Right;  RIGHT SHOULDER ARTHROSCOPY, DECOMPRESSION SUBACROMIAL PARTIAL ACROMIOPLASTY WITH CORACOACROMIAL RELEASE,, DISTAL CLAVICULECTOMY , ROTATOR CUFF debriedment  . Raz procedure  10+yrs ago  . Carpal tunnel release Bilateral   . Cataract surgery Bilateral   . Rotator cuff repair      x 4  . Breast enhancement surgery Bilateral   . Cesarean section      x 3  . Breast biopsy    . Cardiac catheterization  >20yrs ago    Prescriptions prior to admission   Medication Sig Dispense Refill Last Dose  . ALPRAZolam (XANAX) 0.5 MG tablet Take 0.5 mg by mouth 3 (three) times daily as needed.    08/16/2014 at Unknown time  . atenolol (TENORMIN) 25 MG tablet Take 25 mg by mouth daily.   08/16/2014 at 0445  . brimonidine (ALPHAGAN P) 0.1 % SOLN Place 1 drop into both eyes 2 (two) times daily.   08/16/2014 at Unknown time  . canagliflozin (INVOKANA) 300 MG TABS tablet Take 300 mg by mouth daily before breakfast.   08/16/2014 at Unknown time  . exenatide (BYETTA) 10 MCG/0.04ML SOLN Inject 10 mcg into the skin 2 (two) times daily before a meal.   08/15/2014 at Unknown time  . febuxostat (ULORIC) 40 MG tablet Take 40 mg by mouth daily.    08/16/2014 at Unknown time  . hydrochlorothiazide (,MICROZIDE/HYDRODIURIL,) 12.5 MG capsule Take 12.5 mg by mouth daily.     08/15/2014 at Unknown time  . latanoprost (XALATAN) 0.005 % ophthalmic solution Place 1 drop into both eyes at bedtime.   08/16/2014 at Unknown time  . levothyroxine (SYNTHROID, LEVOTHROID) 50 MCG tablet Take 50 mcg by mouth daily. Take 2 tablet by mouth on Sunday   08/16/2014 at Unknown time  . spironolactone (ALDACTONE) 50 MG tablet Take 25 mg by mouth daily.    08/15/2014 at Unknown time   Allergies  Allergen Reactions  . Contrast Media [Iodinated Diagnostic Agents] Shortness Of Breath and Other (See Comments)    bp elevated  . Oxycodone Nausea And Vomiting  . Adhesive [Tape]     Pulls skin off  . Allopurinol Diarrhea  . Codeine Nausea And Vomiting  . Colchicine Diarrhea  . Esomeprazole Magnesium Hives  . Hydrocodone Nausea And Vomiting  . Meperidine Hcl Nausea And Vomiting  . Pentazocine Lactate Nausea And Vomiting    History  Substance Use Topics  . Smoking status: Former Research scientist (life sciences)  . Smokeless tobacco: Not on file     Comment: quit smoking 68yrs ago  . Alcohol Use: No    Family History  Problem Relation Age of Onset  . Hypertension    . Arthritis    . Diabetes Mother   . Heart attack Father       Review of Systems A comprehensive review of systems was negative.  Objective: Vital signs in last 24 hours: Temp:  [98.3 F (36.8 C)] 98.3 F (36.8 C) (04/18 0610) Pulse Rate:  [65] 65 (04/18 0610) Resp:  [18] 18 (04/18 0610) BP: (112)/(67) 112/67 mmHg (04/18 0610) SpO2:  [99 %] 99 % (04/18 0610)  EXAM: Patient is a well-developed well-nourished black female in no acute distress. Lungs are clear to auscultation , the patient has symmetrical respiratory excursion. Heart has a regular rate and rhythm normal S1 and S2 no murmur.   Abdomen is soft nontender nondistended bowel sounds are present.  Extremity examination shows no clubbing cyanosis or edema. Neurologic examination shows the strength to the left lower extremity is 5/5 throughout, including the iliopsoas, quadriceps, dorsiflexor, EHL, and plantar flexor. However there is weakness in the right lower extremity. The iliopsoas is 5. The quadriceps are 4+ to 5, but dorsiflexor, EHL, and plantar flexor are 2-3/5. Sensation is decreased to pinprick in the right foot, as compared to the left foot. Reflexes are absent the quadriceps and gastrocnemius. They're symmetrical bilaterally. Toes are downgoing bilaterally. Gait and stance favor the right lower extremity.  Data Review:CBC    Component Value Date/Time   WBC 5.6 08/09/2014 1047   RBC 4.51 08/09/2014 1047   HGB 12.0 08/09/2014 1047   HCT 37.9 08/09/2014 1047   PLT 273 08/09/2014 1047   MCV 84.0 08/09/2014 1047   MCH 26.6 08/09/2014 1047   MCHC 31.7 08/09/2014 1047   RDW 14.0 08/09/2014 1047   LYMPHSABS 2.1 02/23/2009 2024   MONOABS 0.5 02/23/2009 2024   EOSABS 0.5 02/23/2009 2024   BASOSABS 0.0 02/23/2009 2024                          BMET    Component Value Date/Time   NA 138 08/09/2014 1047   K 3.8 08/09/2014 1047   CL 102 08/09/2014 1047   CO2 23 08/09/2014 1047   GLUCOSE 82 08/09/2014 1047   BUN 29* 08/09/2014 1047   CREATININE 1.36* 08/09/2014 1047   CALCIUM  9.8 08/09/2014 1047   GFRNONAA 39* 08/09/2014 1047   GFRAA 46* 08/09/2014 1047     Assessment/Plan: Patient low back and right lumbar radicular pain, with significant weakness in the right lower extremity, status post previous L5-S1 lumbar decompression and arthrodesis in October 2005, but with progressive degeneration, stenosis, and instability, L4-5 worse than L3-4. Patient is admitted now for an L3-L5 lumbar decompression, an L3-L5 stabilization including interbody arthrodesis and posterior lateral arthrodesis.  I've discussed with the patient the nature of his condition, the nature the surgical procedure, the typical length of surgery, hospital stay, and overall recuperation, the limitations postoperatively, and risks of surgery. I discussed risks including risks of infection, bleeding, possibly need for transfusion, the risk of nerve root dysfunction with pain, weakness, numbness, or paresthesias, the risk of dural tear and CSF leakage and possible need for further surgery, the risk of failure of the arthrodesis and possibly for further surgery, the risk of anesthetic complications including myocardial infarction, stroke, pneumonia, and death. We discussed the need for postoperative immobilization in a lumbar brace. Understanding all this the patient does wish to proceed with surgery and is admitted for such.     Hosie Spangle, MD 08/16/2014 7:14 AM

## 2014-08-16 NOTE — Transfer of Care (Signed)
Immediate Anesthesia Transfer of Care Note  Patient: [name]  Procedure(s) Performed: Procedure(s) with comments: LUMBAR THREE-FOUR, LUMBAR FOUR-FIVE POSTERIOR LUMBAR FUSION 2 LEVEL (N/A) - L34 L45 decompression with posterior lumbar interbody fusion interbody prostheis posterior lateral arthrodesis and posterior segmental instrumentation  Patient Location: PACU  Anesthesia Type:General  Level of Consciousness: awake and alert   Airway & Oxygen Therapy: Patient Spontanous Breathing and Patient connected to face mask oxygen  Post-op Assessment: Report given to RN and Post -op Vital signs reviewed and stable  Post vital signs: Reviewed and stable  Last Vitals:  Filed Vitals:   08/16/14 0610  BP: 112/67  Pulse: 65  Temp: 36.8 C  Resp: 18    Complications: No apparent anesthesia complications

## 2014-08-16 NOTE — Progress Notes (Signed)
Filed Vitals:   08/16/14 1515 08/16/14 1530 08/16/14 1545 08/16/14 1610  BP: 100/56 95/60 97/59  111/72  Pulse: 47 44 46 57  Temp:   97.6 F (36.4 C)   TempSrc:      Resp: 16 12 14 16   SpO2: 99% 100% 99% 98%    Patient resting comfortably in bed. Dressing clean and dry. Moving all 4 extremities.  Foley to straight drainage.  Plan: Continue to progress through postoperative recovery. Encouraged to ambulate.  Hosie Spangle, MD 08/16/2014, 6:38 PM

## 2014-08-16 NOTE — Anesthesia Preprocedure Evaluation (Addendum)
Anesthesia Evaluation  Patient identified by MRN, date of birth, ID band Patient awake    History of Anesthesia Complications (+) PONV and history of anesthetic complications  Airway Mallampati: II  TM Distance: >3 FB Neck ROM: Full    Dental  (+) Teeth Intact   Pulmonary former smoker,  breath sounds clear to auscultation        Cardiovascular hypertension, Rhythm:Regular Rate:Normal     Neuro/Psych negative neurological ROS     GI/Hepatic GERD-  ,  Endo/Other  diabetesHypothyroidism   Renal/GU Renal InsufficiencyRenal disease     Musculoskeletal  (+) Arthritis -,   Abdominal   Peds  Hematology  (+) anemia ,   Anesthesia Other Findings   Reproductive/Obstetrics                            Anesthesia Physical Anesthesia Plan  ASA: III  Anesthesia Plan: General   Post-op Pain Management:    Induction: Intravenous  Airway Management Planned: Oral ETT  Additional Equipment:   Intra-op Plan:   Post-operative Plan: Extubation in OR  Informed Consent: I have reviewed the patients History and Physical, chart, labs and discussed the procedure including the risks, benefits and alternatives for the proposed anesthesia with the patient or authorized representative who has indicated his/her understanding and acceptance.   Dental advisory given  Plan Discussed with: CRNA and Surgeon  Anesthesia Plan Comments:         Anesthesia Quick Evaluation

## 2014-08-16 NOTE — Anesthesia Procedure Notes (Signed)
Procedure Name: Intubation Performed by: Rogers Blocker Pre-anesthesia Checklist: Patient identified, Timeout performed, Emergency Drugs available, Suction available and Patient being monitored Patient Re-evaluated:Patient Re-evaluated prior to inductionOxygen Delivery Method: Circle system utilized Preoxygenation: Pre-oxygenation with 100% oxygen Intubation Type: IV induction Ventilation: Mask ventilation without difficulty Laryngoscope Size: Mac and 3 Grade View: Grade I Tube type: Oral Tube size: 7.5 mm Number of attempts: 1 Airway Equipment and Method: Stylet Placement Confirmation: ETT inserted through vocal cords under direct vision,  positive ETCO2,  CO2 detector and breath sounds checked- equal and bilateral Secured at: 22 cm Tube secured with: Tape Dental Injury: Teeth and Oropharynx as per pre-operative assessment

## 2014-08-17 LAB — GLUCOSE, CAPILLARY
Glucose-Capillary: 126 mg/dL — ABNORMAL HIGH (ref 70–99)
Glucose-Capillary: 113 mg/dL — ABNORMAL HIGH (ref 70–99)
Glucose-Capillary: 122 mg/dL — ABNORMAL HIGH (ref 70–99)
Glucose-Capillary: 117 mg/dL — ABNORMAL HIGH (ref 70–99)

## 2014-08-17 MED ORDER — ONDANSETRON HCL 4 MG/2ML IJ SOLN
INTRAMUSCULAR | Status: DC | PRN
  Administered 2014-08-16: 4 mg via INTRAVENOUS

## 2014-08-17 NOTE — Progress Notes (Signed)
Filed Vitals:   08/16/14 1610 08/16/14 2000 08/17/14 0010 08/17/14 0400  BP: 111/72 112/64 103/60 100/47  Pulse: 57 55 62 59  Temp:  97.7 F (36.5 C) 98.2 F (36.8 C) 98.5 F (36.9 C)  TempSrc:  Oral Oral Oral  Resp: 16 18 20 20   SpO2: 98% 100% 100% 100%     Patient resting in bed, has been up and ambulating in the halls. Foley DC'd by nursing staff, hasn't voided yet, voiding function being monitored by staff. Dressing clean and dry.  Plan: Encouraged to ambulate at least 4 times in the halls today.  Hosie Spangle, MD 08/17/2014, 7:59 AM

## 2014-08-17 NOTE — Anesthesia Postprocedure Evaluation (Signed)
  Anesthesia Post-op Note  Patient: KATARZYNA WOLVEN  Procedure(s) Performed: Procedure(s) with comments: LUMBAR THREE-FOUR, LUMBAR FOUR-FIVE POSTERIOR LUMBAR FUSION 2 LEVEL (N/A) - L34 L45 decompression with posterior lumbar interbody fusion interbody prostheis posterior lateral arthrodesis and posterior segmental instrumentation  Patient Location: PACU  Anesthesia Type:General  Level of Consciousness: awake, oriented and sedated  Airway and Oxygen Therapy: Patient Spontanous Breathing  Post-op Pain: mild  Post-op Assessment: Post-op Vital signs reviewed  Post-op Vital Signs: stable  Last Vitals:  Filed Vitals:   08/17/14 0826  BP: 97/46  Pulse: 62  Temp: 37 C  Resp: 18    Complications: No apparent anesthesia complications

## 2014-08-18 LAB — CBC
HCT: 26.6 % — ABNORMAL LOW (ref 36.0–46.0)
Hemoglobin: 8.6 g/dL — ABNORMAL LOW (ref 12.0–15.0)
MCH: 27 pg (ref 26.0–34.0)
MCHC: 32.3 g/dL (ref 30.0–36.0)
MCV: 83.4 fL (ref 78.0–100.0)
PLATELETS: 184 10*3/uL (ref 150–400)
RBC: 3.19 MIL/uL — AB (ref 3.87–5.11)
RDW: 14.2 % (ref 11.5–15.5)
WBC: 8.1 10*3/uL (ref 4.0–10.5)

## 2014-08-18 LAB — GLUCOSE, CAPILLARY
Glucose-Capillary: 119 mg/dL — ABNORMAL HIGH (ref 70–99)
Glucose-Capillary: 96 mg/dL (ref 70–99)

## 2014-08-18 MED ORDER — HYDROMORPHONE HCL 2 MG PO TABS: 1.0000 mg | ORAL_TABLET | ORAL | Status: DC | PRN

## 2014-08-18 MED ORDER — SODIUM CHLORIDE 0.9 % IV BOLUS (SEPSIS)
500.0000 mL | Freq: Once | INTRAVENOUS | Status: AC
  Administered 2014-08-18: 500 mL via INTRAVENOUS

## 2014-08-18 NOTE — Discharge Summary (Signed)
Physician Discharge Summary  Patient ID: RUSTY GLODOWSKI MRN: [id number] DOB/AGE: 1945-08-24 69 y.o.  Admit date: 08/16/2014 Discharge date: 08/18/2014  Admission Diagnoses:  lumbar stenosis with neurogenic claudication, lumbar spondylosis, lumbar degenerative disc disease, right lumbar radiculopathy  Discharge Diagnoses:  lumbar stenosis with neurogenic claudication, lumbar spondylosis, lumbar degenerative disc disease, right lumbar radiculopathy Active Problems:   Lumbar stenosis with neurogenic claudication   Discharged Condition: good  Hospital Course: Patient was admitted, underwent an L3-L5 lumbar decompression including laminectomy, facetectomy and foraminotomies, and L3-4 and L4-5 PLIF, and an L3-L5 PLA with postreduction patient from L3-S1. Postoperatively she has done very well. Only mild discomfort. She is up and ambulating actively. The motor deficit in the right foot has recovered and she has 5/5 strength in the dorsiflexor, EHL, and plantar flexor bilaterally. She is up and ambulate actively in the halls, and has been given instructions regarding wound care and activities following discharge. She is to return for follow-up with me in 3 weeks.  Discharge Exam: Blood pressure 119/65, pulse 60, temperature 98.4 F (36.9 C), temperature source Oral, resp. rate 18, SpO2 100 %.  Disposition: Home     Medication List    TAKE these medications        ALPRAZolam 0.5 MG tablet  Commonly known as:  XANAX  Take 0.5 mg by mouth 3 (three) times daily as needed.     atenolol 25 MG tablet  Commonly known as:  TENORMIN  Take 25 mg by mouth daily.     brimonidine 0.1 % Soln  Commonly known as:  ALPHAGAN P  Place 1 drop into both eyes 2 (two) times daily.     canagliflozin 300 MG Tabs tablet  Commonly known as:  INVOKANA  Take 300 mg by mouth daily before breakfast.     exenatide 10 MCG/0.04ML Sopn injection  Commonly known as:  BYETTA  Inject 10 mcg into the skin 2 (two)  times daily before a meal.     febuxostat 40 MG tablet  Commonly known as:  ULORIC  Take 40 mg by mouth daily.     hydrochlorothiazide 12.5 MG capsule  Commonly known as:  MICROZIDE  Take 12.5 mg by mouth daily.     HYDROmorphone 2 MG tablet  Commonly known as:  DILAUDID  Take 0.5-1 tablets (1-2 mg total) by mouth every 4 (four) hours as needed (pain).     latanoprost 0.005 % ophthalmic solution  Commonly known as:  XALATAN  Place 1 drop into both eyes at bedtime.     levothyroxine 50 MCG tablet  Commonly known as:  SYNTHROID, LEVOTHROID  Take 50 mcg by mouth daily. Take 2 tablet by mouth on Sunday     spironolactone 50 MG tablet  Commonly known as:  ALDACTONE  Take 25 mg by mouth daily.         Signed: Hosie Spangle, MD 08/18/2014, 9:58 AM

## 2014-08-18 NOTE — Discharge Instructions (Signed)
Wound Care °Leave incision open to air. °You may shower. °Do not scrub directly on incision.  °Do not put any creams, lotions, or ointments on incision. °Activity °Walk each and every day, increasing distance each day. °No lifting greater than 5 lbs.  Avoid bending, arching, and twisting. °No driving for 2 weeks; may ride as a passenger locally. °If provided with back brace, wear when out of bed.  It is not necessary to wear in bed. °Diet °Resume your normal diet.  °Return to Work °Will be discussed at you follow up appointment. °Call Your Doctor If Any of These Occur °Redness, drainage, or swelling at the wound.  °Temperature greater than 101 degrees. °Severe pain not relieved by pain medication. °Incision starts to come apart. °Follow Up Appt °Call today for appointment in 3 weeks ([phone]) or for problems.  If you have any hardware placed in your spine, you will need an x-ray before your appointment. ° ° °Spinal Fusion °Spinal fusion is a procedure to make 2 or more of the bones in your spinal column (vertebrae) grow together (fuse). This procedure stops movement between the vertebrae and can relieve pain and prevent deformity.  °Spinal fusion is used to treat the following conditions: °· Fractures of the spine. °· Herniated disk (the spongy material [cartilage] between the vertebrae). °· Abnormal curvatures of the spine, such as scoliosis or kyphosis. °· A weak or an unstable spine, caused by infections or tumor. °RISKS AND COMPLICATIONS °Complications associated with spinal fusion are rare, but they can occur. Possible complications include: °· Bleeding. °· Infection near the incision. °· Nerve damage. Signs of nerve damage are back pain, pain in one or both legs, weakness, or numbness. °· Spinal fluid leakage. °· Blood clot in your leg, which can move to your lungs. °· Difficulty controlling urination or bowel movements. °BEFORE THE PROCEDURE °· A medical evaluation will be done. This will include a physical  exam, blood tests, and imaging exams. °· You will talk with an anesthesiologist. This is the person who will be in charge of the anesthesia during the procedure. Spinal fusion usually requires that you are asleep during the procedure (general anesthesia). °· You will need to stop taking certain medicines, particularly those associated with an increased risk of bleeding. Ask your caregiver about changing or stopping your regular medicines. °· If you smoke, you will need to stop at least 2 weeks before the procedure. Smoking can slow down the healing process, especially fusion of the vertebrae, and increase the risk of complications. °· Do not eat or drink anything for at least 8 hours before the procedure. °PROCEDURE  °A cut (incision) is made over the vertebrae that will be fused. The back muscles are separated from the vertebrae. If you are having this procedure to treat a herniated disk, the disc material pressing on the nerve root is removed (decompression). The area where the disk is removed is then filled with extra bone. Bone from another part of your body (autogenous bone) or bone from a bone donor (allograft bone) may be used. The extra bone promotes fusion between the vertebrae. Sometimes, specific medicines are added to the fusion area to promote bone healing. In most cases, screws and rods or metal plates will be used to attach the vertebrae to stabilize them while they fuse.  °AFTER THE PROCEDURE  °· You will stay in a recovery area until the anesthesia has worn off. Your blood pressure and pulse will be checked frequently. °· You will be   given antibiotics to prevent infection.  You may continue to receive fluids through an intravenous (IV) tube while you are still in the hospital.  Pain after surgery is normal. You will be given pain medicine.  You will be taught how to move correctly and how to stand and walk. While in bed, you will be instructed to turn frequently, using a "log rolling"  technique, in which the entire body is moved without twisting the back. Document Released: 01/13/2003 Document Revised: 07/09/2011 Document Reviewed: 06/29/2010 [facility] Patient Information 2015 [place], [place]. This information is not intended to replace advice given to you by your health care provider. Make sure you discuss any questions you have with your health care provider.

## 2014-08-18 NOTE — Progress Notes (Signed)
Pt doing well. Pt and daughter given D/C instructions with Rx, verbal understanding was provided. Pt's IV was removed prior to D/C. Pt's incision is clean and dry with no sign of infection. 3-N-1 was delivered to Pt prior to D/C. Pt D/C'd home via wheelchair @ 1630 per MD order. Pt is stable @ D/C and has no other needs at this time. Holli Humbles, RN

## 2014-09-28 ENCOUNTER — Other Ambulatory Visit: Payer: Self-pay

## 2014-09-28 DIAGNOSIS — Z1231 Encounter for screening mammogram for malignant neoplasm of breast: Secondary | ICD-10-CM

## 2014-10-20 ENCOUNTER — Ambulatory Visit
Admission: RE | Admit: 2014-10-20 | Discharge: 2014-10-20 | Disposition: A | Discharge status: Home / Self Care | Payer: Medicare Other | Source: Ambulatory Visit

## 2014-10-20 DIAGNOSIS — Z1231 Encounter for screening mammogram for malignant neoplasm of breast: Secondary | ICD-10-CM

## 2014-12-18 ENCOUNTER — Emergency Department (HOSPITAL_COMMUNITY)
Admission: EM | Admit: 2014-12-18 | Discharge: 2014-12-18 | Disposition: A | Discharge status: Home / Self Care | Payer: Medicare Other | Attending: Emergency Medicine | Admitting: Emergency Medicine

## 2014-12-18 ENCOUNTER — Encounter (HOSPITAL_COMMUNITY): Payer: Self-pay | Admitting: *Deleted

## 2014-12-18 ENCOUNTER — Emergency Department (HOSPITAL_COMMUNITY): Payer: Medicare Other

## 2014-12-18 DIAGNOSIS — N183 Chronic kidney disease, stage 3 (moderate): Secondary | ICD-10-CM | POA: Diagnosis not present

## 2014-12-18 DIAGNOSIS — W1839XA Other fall on same level, initial encounter: Secondary | ICD-10-CM | POA: Insufficient documentation

## 2014-12-18 DIAGNOSIS — Y9389 Activity, other specified: Secondary | ICD-10-CM | POA: Diagnosis not present

## 2014-12-18 DIAGNOSIS — S50812A Abrasion of left forearm, initial encounter: Secondary | ICD-10-CM | POA: Diagnosis not present

## 2014-12-18 DIAGNOSIS — E119 Type 2 diabetes mellitus without complications: Secondary | ICD-10-CM | POA: Diagnosis not present

## 2014-12-18 DIAGNOSIS — Z86010 Personal history of colonic polyps: Secondary | ICD-10-CM | POA: Diagnosis not present

## 2014-12-18 DIAGNOSIS — Z8709 Personal history of other diseases of the respiratory system: Secondary | ICD-10-CM | POA: Insufficient documentation

## 2014-12-18 DIAGNOSIS — S4992XA Unspecified injury of left shoulder and upper arm, initial encounter: Secondary | ICD-10-CM | POA: Diagnosis present

## 2014-12-18 DIAGNOSIS — Z862 Personal history of diseases of the blood and blood-forming organs and certain disorders involving the immune mechanism: Secondary | ICD-10-CM | POA: Diagnosis not present

## 2014-12-18 DIAGNOSIS — Z87891 Personal history of nicotine dependence: Secondary | ICD-10-CM | POA: Insufficient documentation

## 2014-12-18 DIAGNOSIS — E039 Hypothyroidism, unspecified: Secondary | ICD-10-CM | POA: Diagnosis not present

## 2014-12-18 DIAGNOSIS — M25512 Pain in left shoulder: Secondary | ICD-10-CM

## 2014-12-18 DIAGNOSIS — M109 Gout, unspecified: Secondary | ICD-10-CM | POA: Diagnosis not present

## 2014-12-18 DIAGNOSIS — I129 Hypertensive chronic kidney disease with stage 1 through stage 4 chronic kidney disease, or unspecified chronic kidney disease: Secondary | ICD-10-CM | POA: Insufficient documentation

## 2014-12-18 DIAGNOSIS — Y92000 Kitchen of unspecified non-institutional (private) residence as  the place of occurrence of the external cause: Secondary | ICD-10-CM | POA: Diagnosis not present

## 2014-12-18 DIAGNOSIS — Y998 Other external cause status: Secondary | ICD-10-CM | POA: Insufficient documentation

## 2014-12-18 DIAGNOSIS — H409 Unspecified glaucoma: Secondary | ICD-10-CM | POA: Diagnosis not present

## 2014-12-18 DIAGNOSIS — F419 Anxiety disorder, unspecified: Secondary | ICD-10-CM | POA: Diagnosis not present

## 2014-12-18 DIAGNOSIS — Z8701 Personal history of pneumonia (recurrent): Secondary | ICD-10-CM | POA: Insufficient documentation

## 2014-12-18 DIAGNOSIS — Z79899 Other long term (current) drug therapy: Secondary | ICD-10-CM | POA: Insufficient documentation

## 2014-12-18 DIAGNOSIS — M25522 Pain in left elbow: Secondary | ICD-10-CM

## 2014-12-18 DIAGNOSIS — T148XXA Other injury of unspecified body region, initial encounter: Secondary | ICD-10-CM

## 2014-12-18 NOTE — Discharge Instructions (Signed)

## 2014-12-18 NOTE — ED Notes (Signed)
Sling applied by ortho tech. Pt verbalized understanding d/c instructions.

## 2014-12-18 NOTE — ED Notes (Signed)
Pt reports approx 1730 today kitchen cabinet fell on her L side, mostly her L arm and shoulder. C/o the worst pain being to her L FA. There is a small abrasion there as well. Hurts to straighten arm but able to. Guarding arm to side of body.

## 2014-12-18 NOTE — ED Provider Notes (Signed)
CSN: [id number]     Arrival date & time 12/18/14  1903 History   First MD Initiated Contact with Patient 12/18/14 1922     Chief Complaint  Patient presents with  . Arm Pain  . Arm Injury     (Consider location/radiation/quality/duration/timing/severity/associated sxs/prior Treatment) HPI Comments: Pt comes in with c/o left shoulder and elbow pain after part of a kitchen cabinet fell on her. No loc. No injury to the head. No numbness or weakness. Hasn't taken anything for pain. She states that she also has a scrape to her left forearm. No previous injury to either area  The history is provided by the patient. No language interpreter was used.    Past Medical History  Diagnosis Date  . Glaucoma     uses Eye Drops  . Arthritis   . PONV (postoperative nausea and vomiting)   . Wolf-Parkinson-White syndrome     takes Atenolol daily  . Anxiety     takes Xanax daily as needed  . Hypothyroidism     takes Synthroid daily  . Diabetes mellitus     takes Byetta and Invokana daily  . Gout     takes Uloric daily  . Hypertension     takes HCTZ daily as well as Aldactone  . Pneumonia     many yrs ago  . Bronchitis     a month ago  . Weakness     numbness and tingling in right hip/eg/  . Chronic back pain     stenosis  . Chronic kidney disease     stage 3-sees Dr.J Patel  . History of colon polyps   . Stress incontinence   . Anemia     hx of  . History of blood transfusion     no abnormal reaction    Past Surgical History  Procedure Laterality Date  . Thyroid surgery    . Abdominal hysterectomy    . Skin biopsy    . Cholecystectomy  1981  . Knee arthroscopy  2009    rt  . Ankle arthroplasty  2010    lt  . Appendectomy    . Colonoscopy    . Shoulder arthroscopy with rotator cuff repair and subacromial decompression Right 06/10/2012    Procedure: SHOULDER ARTHROSCOPY WITH ROTATOR CUFF REPAIR AND SUBACROMIAL DECOMPRESSION;  Surgeon: Lorn Junes, MD;  Location: [facility];  Service: Orthopedics;  Laterality: Right;  RIGHT SHOULDER ARTHROSCOPY, DECOMPRESSION SUBACROMIAL PARTIAL ACROMIOPLASTY WITH CORACOACROMIAL RELEASE,, DISTAL CLAVICULECTOMY , ROTATOR CUFF debriedment  . Raz procedure  10+yrs ago  . Carpal tunnel release Bilateral   . Cataract surgery Bilateral   . Rotator cuff repair      x 4  . Breast enhancement surgery Bilateral   . Cesarean section      x 3  . Breast biopsy    . Cardiac catheterization  >25yrs ago  . Back surgery  2006    lumb fusion  . Back surgery  08/16/2014    L3-4 fusion   Family History  Problem Relation Age of Onset  . Hypertension    . Arthritis    . Diabetes Mother   . Heart attack Father    Social History  Substance Use Topics  . Smoking status: Former Research scientist (life sciences)  . Smokeless tobacco: None     Comment: quit smoking 53yrs ago  . Alcohol Use: No   OB History    No data available     Review of Systems  All other systems reviewed and are negative.     Allergies  Contrast media; Oxycodone; Adhesive; Allopurinol; Codeine; Colchicine; Esomeprazole magnesium; Hydrocodone; Meperidine hcl; and Pentazocine lactate  Home Medications   Prior to Admission medications   Medication Sig Start Date End Date Taking? Authorizing Provider  ALPRAZolam Duanne Moron) 0.5 MG tablet Take 0.5 mg by mouth 3 (three) times daily as needed for anxiety.    Yes Historical Provider, MD  atenolol (TENORMIN) 25 MG tablet Take 25 mg by mouth daily.   Yes Historical Provider, MD  canagliflozin (INVOKANA) 300 MG TABS tablet Take 300 mg by mouth daily before breakfast.   Yes Historical Provider, MD  exenatide (BYETTA) 10 MCG/0.04ML SOLN Inject 10 mcg into the skin 2 (two) times daily before a meal.   Yes Historical Provider, MD  febuxostat (ULORIC) 40 MG tablet Take 40 mg by mouth daily.    Yes Historical Provider, MD  hydrochlorothiazide (,MICROZIDE/HYDRODIURIL,) 12.5 MG capsule Take 12.5 mg by mouth daily.     Yes Historical  Provider, MD  latanoprost (XALATAN) 0.005 % ophthalmic solution Place 1 drop into both eyes at bedtime.   Yes Historical Provider, MD  levothyroxine (SYNTHROID, LEVOTHROID) 50 MCG tablet Take 50-100 mcg by mouth daily. Take 50 mcg daily, except 100 mcg by mouth on Sunday   Yes Historical Provider, MD  pravastatin (PRAVACHOL) 20 MG tablet Take 20 mg by mouth daily. 12/07/14  Yes Historical Provider, MD  spironolactone (ALDACTONE) 50 MG tablet Take 25 mg by mouth daily.    Yes Historical Provider, MD  HYDROmorphone (DILAUDID) 2 MG tablet Take 0.5-1 tablets (1-2 mg total) by mouth every 4 (four) hours as needed (pain). Patient not taking: Reported on 12/18/2014 08/18/14   Jovita Gamma, MD   BP 108/70 mmHg  Pulse 66  Temp(Src) 98.1 F (36.7 C) (Oral)  Resp 15  SpO2 98% Physical Exam  Constitutional: She is oriented to person, place, and time. She appears well-developed and well-nourished.  Cardiovascular: Normal rate and regular rhythm.   Pulmonary/Chest: Effort normal and breath sounds normal.  Musculoskeletal: Normal range of motion.  Tender in the anterior left shoulder. No gross deformity of swelling noted. Tender in the later left elbow. Pulses intact. No gross deformity  Neurological: She is alert and oriented to person, place, and time.  Skin:  Abrasion to the left forearm  Nursing note and vitals reviewed.   ED Course  Procedures (including critical care time) Labs Review Labs Reviewed - No data to display  Imaging Review Dg Elbow Complete Left  12/18/2014   CLINICAL DATA:  Status post fall this evening with the left elbow pain.  EXAM: LEFT ELBOW - COMPLETE 3+ VIEW  COMPARISON:  None.  FINDINGS: There is no evidence of fracture, dislocation, or joint effusion. Soft tissues are unremarkable.  IMPRESSION: Negative.   Electronically Signed   By: Abelardo Diesel M.D.   On: 12/18/2014 19:57   Dg Forearm Left  12/18/2014   CLINICAL DATA:  Status post fall this evening with forearm  pain.  EXAM: LEFT FOREARM - 2 VIEW  COMPARISON:  None.  FINDINGS: There is no evidence of fracture or other focal bone lesions. Soft tissues are unremarkable.  IMPRESSION: Negative.   Electronically Signed   By: Abelardo Diesel M.D.   On: 12/18/2014 19:55   Dg Shoulder Left  12/18/2014   CLINICAL DATA:  [place] cabinet fell on patient's left side, with left lateral shoulder pain. Initial encounter.  EXAM: LEFT SHOULDER - 2+ VIEW  COMPARISON:  Chest radiograph performed 08/09/2014  FINDINGS: There is no evidence of fracture or dislocation. 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 The left humeral head is seated within the glenoid fossa. The acromioclavicular joint is unremarkable in appearance. No significant soft tissue abnormalities are seen. The visualized portions of the left lung are clear.  IMPRESSION: 1. No evidence of fracture or dislocation. 2. 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   Electronically Signed   By: Garald Balding M.D.   On: 12/18/2014 19:52   I have personally reviewed and evaluated these images and lab results as part of my medical decision-making.   EKG Interpretation None      MDM   Final diagnoses:  Left shoulder pain  Abrasion  Elbow pain, left    No acute bony injury. Pt given sling for comfort.    Glendell Docker, NP 12/18/14 2024  Varney Biles, MD 12/20/14 [phone]

## 2015-05-03 MED FILL — UNIFINE PENTIPS 8MM 31G: 31G X 8 MM | 90 days supply | Qty: 200 | Fill #0

## 2015-05-06 MED FILL — AZITHROMYCIN 500 MG TABLET: 500 | 3 days supply | Qty: 3 | Fill #0

## 2015-06-06 MED FILL — SYNTHROID 50 MCG TABLET: 50 | 90 days supply | Qty: 135 | Fill #1

## 2015-06-06 MED FILL — ULORIC 40 MG TABLET: 40 | 30 days supply | Qty: 30 | Fill #2

## 2015-06-10 MED FILL — ALPRAZolam 0.5 MG TABS: 0.5 | 90 days supply | Qty: 270 | Fill #0

## 2015-06-14 MED FILL — AZITHROMYCIN 250 MG TABLET: 250 | 5 days supply | Qty: 6 | Fill #0

## 2015-06-22 MED FILL — FUROSEMIDE 20 MG TABLET: 20 | 30 days supply | Qty: 30 | Fill #2

## 2015-06-22 MED FILL — HYDROCHLOROTHIAZIDE 12.5 MG: 12.5 | 90 days supply | Qty: 90 | Fill #1

## 2015-07-25 MED FILL — SPIRONOLACTONE 25 MG TABLET: 25 | 90 days supply | Qty: 90 | Fill #3

## 2015-07-25 MED FILL — ATENOLOL 25 MG TABLET: 25 | 90 days supply | Qty: 90 | Fill #3

## 2015-08-15 MED FILL — TRULICITY 0.75 MG/0.5 ML PE: 0.75 | 28 days supply | Qty: 2 | Fill #0

## 2015-08-15 MED FILL — PRAVASTATIN SODIUM 20 MG TA: 20 | 90 days supply | Qty: 90 | Fill #3

## 2015-08-15 MED FILL — FUROSEMIDE 20 MG TABLET: 20 | 30 days supply | Qty: 30 | Fill #3

## 2015-09-05 MED FILL — ULORIC 40 MG TABLET: 40 | 30 days supply | Qty: 30 | Fill #3

## 2015-09-06 MED FILL — ALPRAZolam 0.5 MG TABS: 0.5 | 90 days supply | Qty: 270 | Fill #1

## 2015-09-16 MED FILL — TRULICITY 0.75 MG/0.5 ML PE: 0.75 | 28 days supply | Qty: 2 | Fill #1

## 2015-09-22 ENCOUNTER — Other Ambulatory Visit: Payer: Self-pay

## 2015-09-22 DIAGNOSIS — Z1231 Encounter for screening mammogram for malignant neoplasm of breast: Secondary | ICD-10-CM

## 2015-09-23 MED FILL — GABAPENTIN 300 MG CAPSULE: 300 | 90 days supply | Qty: 90 | Fill #0

## 2015-10-10 MED FILL — FUROSEMIDE 20 MG TABLET: 20 | 30 days supply | Qty: 30 | Fill #4

## 2015-10-13 MED FILL — TRULICITY 0.75 MG/0.5 ML PE: 0.75 | 28 days supply | Qty: 2 | Fill #2

## 2015-10-21 ENCOUNTER — Observation Stay (HOSPITAL_COMMUNITY)
Admission: EM | Admit: 2015-10-21 | Discharge: 2015-10-22 | Disposition: A | Discharge status: Home / Self Care | Payer: Medicare Other | Attending: Internal Medicine | Admitting: Internal Medicine

## 2015-10-21 ENCOUNTER — Encounter (HOSPITAL_COMMUNITY): Payer: Self-pay

## 2015-10-21 ENCOUNTER — Emergency Department (HOSPITAL_COMMUNITY): Payer: Medicare Other

## 2015-10-21 ENCOUNTER — Other Ambulatory Visit: Payer: Self-pay

## 2015-10-21 DIAGNOSIS — E1122 Type 2 diabetes mellitus with diabetic chronic kidney disease: Secondary | ICD-10-CM | POA: Insufficient documentation

## 2015-10-21 DIAGNOSIS — K219 Gastro-esophageal reflux disease without esophagitis: Secondary | ICD-10-CM | POA: Diagnosis present

## 2015-10-21 DIAGNOSIS — I129 Hypertensive chronic kidney disease with stage 1 through stage 4 chronic kidney disease, or unspecified chronic kidney disease: Secondary | ICD-10-CM | POA: Diagnosis not present

## 2015-10-21 DIAGNOSIS — E039 Hypothyroidism, unspecified: Secondary | ICD-10-CM | POA: Diagnosis present

## 2015-10-21 DIAGNOSIS — I959 Hypotension, unspecified: Secondary | ICD-10-CM | POA: Diagnosis not present

## 2015-10-21 DIAGNOSIS — Z87891 Personal history of nicotine dependence: Secondary | ICD-10-CM | POA: Diagnosis not present

## 2015-10-21 DIAGNOSIS — R55 Syncope and collapse: Principal | ICD-10-CM | POA: Diagnosis present

## 2015-10-21 DIAGNOSIS — E119 Type 2 diabetes mellitus without complications: Secondary | ICD-10-CM

## 2015-10-21 DIAGNOSIS — G8929 Other chronic pain: Secondary | ICD-10-CM | POA: Insufficient documentation

## 2015-10-21 DIAGNOSIS — M1990 Unspecified osteoarthritis, unspecified site: Secondary | ICD-10-CM | POA: Insufficient documentation

## 2015-10-21 DIAGNOSIS — I2510 Atherosclerotic heart disease of native coronary artery without angina pectoris: Secondary | ICD-10-CM | POA: Diagnosis present

## 2015-10-21 DIAGNOSIS — I10 Essential (primary) hypertension: Secondary | ICD-10-CM | POA: Diagnosis present

## 2015-10-21 DIAGNOSIS — F419 Anxiety disorder, unspecified: Secondary | ICD-10-CM | POA: Diagnosis not present

## 2015-10-21 DIAGNOSIS — D509 Iron deficiency anemia, unspecified: Secondary | ICD-10-CM | POA: Diagnosis not present

## 2015-10-21 DIAGNOSIS — E785 Hyperlipidemia, unspecified: Secondary | ICD-10-CM | POA: Diagnosis not present

## 2015-10-21 DIAGNOSIS — N183 Chronic kidney disease, stage 3 unspecified: Secondary | ICD-10-CM | POA: Diagnosis present

## 2015-10-21 DIAGNOSIS — H409 Unspecified glaucoma: Secondary | ICD-10-CM | POA: Diagnosis not present

## 2015-10-21 LAB — COMPREHENSIVE METABOLIC PANEL
ALT: 19 U/L (ref 14–54)
AST: 19 U/L (ref 15–41)
Albumin: 3.8 g/dL (ref 3.5–5.0)
Alkaline Phosphatase: 109 U/L (ref 38–126)
Anion gap: 6 (ref 5–15)
BUN: 21 mg/dL — ABNORMAL HIGH (ref 6–20)
CO2: 29 mmol/L (ref 22–32)
CREATININE: 1.37 mg/dL — AB (ref 0.44–1.00)
Calcium: 9.6 mg/dL (ref 8.9–10.3)
Chloride: 103 mmol/L (ref 101–111)
GFR, EST AFRICAN AMERICAN: 45 mL/min — AB (ref >60)
GFR, EST NON AFRICAN AMERICAN: 39 mL/min — AB (ref >60)
Glucose, Bld: 100 mg/dL — ABNORMAL HIGH (ref 65–99)
POTASSIUM: 3.9 mmol/L (ref 3.5–5.1)
Sodium: 138 mmol/L (ref 135–145)
TOTAL PROTEIN: 7.6 g/dL (ref 6.5–8.1)
Total Bilirubin: <0.1 mg/dL — ABNORMAL LOW (ref 0.3–1.2)

## 2015-10-21 LAB — CBC WITH DIFFERENTIAL/PLATELET
Basophils Absolute: 0 10*3/uL (ref 0.0–0.1)
Basophils Relative: 0 %
EOS PCT: 3 %
Eosinophils Absolute: 0.2 10*3/uL (ref 0.0–0.7)
HCT: 32.5 % — ABNORMAL LOW (ref 36.0–46.0)
Hemoglobin: 10.1 g/dL — ABNORMAL LOW (ref 12.0–15.0)
LYMPHS ABS: 2.4 10*3/uL (ref 0.7–4.0)
LYMPHS PCT: 39 %
MCH: 26.6 pg (ref 26.0–34.0)
MCHC: 31.1 g/dL (ref 30.0–36.0)
MCV: 85.5 fL (ref 78.0–100.0)
MONO ABS: 0.4 10*3/uL (ref 0.1–1.0)
MONOS PCT: 7 %
Neutro Abs: 3.1 10*3/uL (ref 1.7–7.7)
Neutrophils Relative %: 51 %
Platelets: 223 10*3/uL (ref 150–400)
RBC: 3.8 MIL/uL — ABNORMAL LOW (ref 3.87–5.11)
RDW: 13.7 % (ref 11.5–15.5)
WBC: 6.2 10*3/uL (ref 4.0–10.5)

## 2015-10-21 LAB — CBG MONITORING, ED: Glucose-Capillary: 100 mg/dL — ABNORMAL HIGH (ref 65–99)

## 2015-10-21 LAB — I-STAT TROPONIN, ED: Troponin i, poc: 0 ng/mL (ref 0.00–0.08)

## 2015-10-21 LAB — TROPONIN I

## 2015-10-21 MED ORDER — SODIUM CHLORIDE 0.9 % IV BOLUS (SEPSIS)
1000.0000 mL | Freq: Once | INTRAVENOUS | Status: AC
  Administered 2015-10-21: 1000 mL via INTRAVENOUS

## 2015-10-21 MED ORDER — SODIUM CHLORIDE 0.9 % IV SOLN: INTRAVENOUS | Status: DC

## 2015-10-21 NOTE — ED Notes (Signed)
Patient was picked up by EMS after having a near syncopal episode and lowering herself to the ground.  No injury from episode.  No orthostatic hypotension for EMS.  No numbness per EMS.  Patient complain of nausea.

## 2015-10-21 NOTE — ED Notes (Signed)
CBG 100 

## 2015-10-21 NOTE — ED Notes (Signed)
Patient transported to X-ray 

## 2015-10-21 NOTE — ED Notes (Signed)
Attempted report 

## 2015-10-21 NOTE — ED Provider Notes (Signed)
CSN: [id number]     Arrival date & time 10/21/15  1727 History   First MD Initiated Contact with Patient 10/21/15 1728     Chief Complaint  Patient presents with  . Near Syncope     (Consider location/radiation/quality/duration/timing/severity/associated sxs/prior Treatment) HPI Patient presents with concern of syncope. Patient states that she was generally well. Just prior tocalling EMS, the patient was walking into a restaurant. She felt suddenly lightheaded, dizzy,and lower herself as she lost consciousnes. No trauma. Patient had no chest pain either before or after the event.  currently she only complains of lightheadedness, denies other pain or complaints. She has multiple medical issues, most prominently hypertension. She recently had changes in multiple antihypertensive medication. She also has a history of Wolff-Parkinson-White syndrome. No ablation.   Past Medical History  Diagnosis Date  . Glaucoma     uses Eye Drops  . Arthritis   . PONV (postoperative nausea and vomiting)   . Wolf-Parkinson-White syndrome     takes Atenolol daily  . Anxiety     takes Xanax daily as needed  . Hypothyroidism     takes Synthroid daily  . Diabetes mellitus     takes Byetta and Invokana daily  . Gout     takes Uloric daily  . Hypertension     takes HCTZ daily as well as Aldactone  . Pneumonia     many yrs ago  . Bronchitis     a month ago  . Weakness     numbness and tingling in right hip/eg/  . Chronic back pain     stenosis  . Chronic kidney disease     stage 3-sees Dr.J Patel  . History of colon polyps   . Stress incontinence   . Anemia     hx of  . History of blood transfusion     no abnormal reaction    Past Surgical History  Procedure Laterality Date  . Thyroid surgery    . Abdominal hysterectomy    . Skin biopsy    . Cholecystectomy  1981  . Knee arthroscopy  2009    rt  . Ankle arthroplasty  2010    lt  . Appendectomy    . Colonoscopy    . Shoulder  arthroscopy with rotator cuff repair and subacromial decompression Right 06/10/2012    Procedure: SHOULDER ARTHROSCOPY WITH ROTATOR CUFF REPAIR AND SUBACROMIAL DECOMPRESSION;  Surgeon: Lorn Junes, MD;  Location: [facility];  Service: Orthopedics;  Laterality: Right;  RIGHT SHOULDER ARTHROSCOPY, DECOMPRESSION SUBACROMIAL PARTIAL ACROMIOPLASTY WITH CORACOACROMIAL RELEASE,, DISTAL CLAVICULECTOMY , ROTATOR CUFF debriedment  . Raz procedure  10+yrs ago  . Carpal tunnel release Bilateral   . Cataract surgery Bilateral   . Rotator cuff repair      x 4  . Breast enhancement surgery Bilateral   . Cesarean section      x 3  . Breast biopsy    . Cardiac catheterization  >38yrs ago  . Back surgery  2006    lumb fusion  . Back surgery  08/16/2014    L3-4 fusion   Family History  Problem Relation Age of Onset  . Hypertension    . Arthritis    . Diabetes Mother   . Heart attack Father    Social History  Substance Use Topics  . Smoking status: Former Research scientist (life sciences)  . Smokeless tobacco: None     Comment: quit smoking 65yrs ago  . Alcohol Use: No   OB  History    No data available     Review of Systems  Constitutional:       Per HPI, otherwise negative  HENT:       Per HPI, otherwise negative  Respiratory:       Per HPI, otherwise negative  Cardiovascular:       Per HPI, otherwise negative  Gastrointestinal: Negative for vomiting.  Endocrine:       Negative aside from HPI  Genitourinary:       Neg aside from HPI   Musculoskeletal:       Per HPI, otherwise negative  Skin: Negative.   Neurological: Positive for syncope.      Allergies  Contrast media; Oxycodone; Adhesive; Allopurinol; Codeine; Colchicine; Esomeprazole magnesium; Hydrocodone; Meperidine hcl; and Pentazocine lactate  Home Medications   Prior to Admission medications   Medication Sig Start Date End Date Taking? Authorizing Provider  ALPRAZolam Duanne Moron) 0.5 MG tablet Take 0.5 mg by mouth 3 (three)  times daily as needed for anxiety.     Historical Provider, MD  atenolol (TENORMIN) 25 MG tablet Take 25 mg by mouth daily.    Historical Provider, MD  canagliflozin (INVOKANA) 300 MG TABS tablet Take 300 mg by mouth daily before breakfast.    Historical Provider, MD  exenatide (BYETTA) 10 MCG/0.04ML SOLN Inject 10 mcg into the skin 2 (two) times daily before a meal.    Historical Provider, MD  febuxostat (ULORIC) 40 MG tablet Take 40 mg by mouth daily.     Historical Provider, MD  hydrochlorothiazide (,MICROZIDE/HYDRODIURIL,) 12.5 MG capsule Take 12.5 mg by mouth daily.      Historical Provider, MD  HYDROmorphone (DILAUDID) 2 MG tablet Take 0.5-1 tablets (1-2 mg total) by mouth every 4 (four) hours as needed (pain). Patient not taking: Reported on 12/18/2014 08/18/14   Jovita Gamma, MD  latanoprost (XALATAN) 0.005 % ophthalmic solution Place 1 drop into both eyes at bedtime.    Historical Provider, MD  levothyroxine (SYNTHROID, LEVOTHROID) 50 MCG tablet Take 50-100 mcg by mouth daily. Take 50 mcg daily, except 100 mcg by mouth on Sunday    Historical Provider, MD  pravastatin (PRAVACHOL) 20 MG tablet Take 20 mg by mouth daily. 12/07/14   Historical Provider, MD  spironolactone (ALDACTONE) 50 MG tablet Take 25 mg by mouth daily.     Historical Provider, MD   There were no vitals taken for this visit. Physical Exam  Constitutional: She is oriented to person, place, and time. She appears well-developed and well-nourished. No distress.  HENT:  Head: Normocephalic and atraumatic.  Eyes: Conjunctivae and EOM are normal.  Cardiovascular: Normal rate and regular rhythm.   Pulmonary/Chest: Effort normal and breath sounds normal. No stridor. No respiratory distress.  Abdominal: She exhibits no distension.  Musculoskeletal: She exhibits no edema.  Neurological: She is alert and oriented to person, place, and time. No cranial nerve deficit. She exhibits normal muscle tone. Coordination normal.  Skin:  Skin is warm and dry.  Psychiatric: She has a normal mood and affect.  Nursing note and vitals reviewed.   ED Course  Procedures (including critical care time) Labs Review Labs Reviewed  CBC WITH DIFFERENTIAL/PLATELET - Abnormal; Notable for the following:    RBC 3.80 (*)    Hemoglobin 10.1 (*)    HCT 32.5 (*)    All other components within normal limits  COMPREHENSIVE METABOLIC PANEL - Abnormal; Notable for the following:    Glucose, Bld 100 (*)    BUN  21 (*)    Creatinine, Ser 1.37 (*)    Total Bilirubin <0.1 (*)    GFR calc non Af Amer 39 (*)    GFR calc Af Amer 45 (*)    All other components within normal limits  CBG MONITORING, ED - Abnormal; Notable for the following:    Glucose-Capillary 100 (*)    All other components within normal limits  TROPONIN I  POCT CBG (FASTING - GLUCOSE)-MANUAL ENTRY  I-STAT TROPOININ, ED    Imaging Review Dg Chest 2 View  10/21/2015  CLINICAL DATA:  Acute onset of syncope.  Initial encounter. EXAM: CHEST  2 VIEW COMPARISON:  Chest radiograph performed 08/09/2014 FINDINGS: The lungs are well-aerated and clear. There is no evidence of focal opacification, pleural effusion or pneumothorax. The heart is normal in size; the mediastinal contour is within normal limits. No acute osseous abnormalities are seen. Lumbar spinal fusion hardware is partially imaged. Clips are noted within the right upper quadrant, reflecting prior cholecystectomy. IMPRESSION: No acute cardiopulmonary process seen. Electronically Signed   By: Garald Balding M.D.   On: 10/21/2015 19:50   I have personally reviewed and evaluated these images and lab results as part of my medical decision-making.   EKG Interpretation   Date/Time:  Friday October 21 2015 17:24:56 EDT Ventricular Rate:  66 PR Interval:    QRS Duration: 105 QT Interval:  431 QTC Calculation: 452 R Axis:   2 Text Interpretation:  Sinus rhythm Artifact No significant change since  last tracing Borderline ECG  Confirmed by Carmin Muskrat  MD [phone]) on  10/21/2015 5:37:51 PM     10:54 PM   I reviewed all findings with patient and her daughter. Labs largely reassuring, but the patient remains near syncopal with upright positioning.  Patient recalls that she has previously be have continuous cardiac monitor has has not occurred.   MDM  Patient presents after episodes of syncope. Here the patient is awake and alert,and initialevaluation is reassuringlightheaded, almost has additional loss of consciousness with upper positioning, attempt at ambulation.  Given the persistent lightheadedness,and the earlier episode of syncope, the patient was admitted for monitoring, management.   Carmin Muskrat, MD 10/21/15 2258

## 2015-10-22 DIAGNOSIS — F419 Anxiety disorder, unspecified: Secondary | ICD-10-CM | POA: Diagnosis not present

## 2015-10-22 DIAGNOSIS — R55 Syncope and collapse: Secondary | ICD-10-CM

## 2015-10-22 DIAGNOSIS — N183 Chronic kidney disease, stage 3 unspecified: Secondary | ICD-10-CM | POA: Diagnosis present

## 2015-10-22 DIAGNOSIS — I10 Essential (primary) hypertension: Secondary | ICD-10-CM | POA: Diagnosis not present

## 2015-10-22 DIAGNOSIS — E038 Other specified hypothyroidism: Secondary | ICD-10-CM

## 2015-10-22 DIAGNOSIS — D509 Iron deficiency anemia, unspecified: Secondary | ICD-10-CM

## 2015-10-22 DIAGNOSIS — E119 Type 2 diabetes mellitus without complications: Secondary | ICD-10-CM

## 2015-10-22 DIAGNOSIS — I2510 Atherosclerotic heart disease of native coronary artery without angina pectoris: Secondary | ICD-10-CM

## 2015-10-22 DIAGNOSIS — K219 Gastro-esophageal reflux disease without esophagitis: Secondary | ICD-10-CM

## 2015-10-22 LAB — BASIC METABOLIC PANEL
Anion gap: 6 (ref 5–15)
BUN: 19 mg/dL (ref 6–20)
CALCIUM: 9.4 mg/dL (ref 8.9–10.3)
CO2: 26 mmol/L (ref 22–32)
CREATININE: 1.2 mg/dL — AB (ref 0.44–1.00)
Chloride: 107 mmol/L (ref 101–111)
GFR calc non Af Amer: 45 mL/min — ABNORMAL LOW (ref >60)
GFR, EST AFRICAN AMERICAN: 53 mL/min — AB (ref >60)
GLUCOSE: 98 mg/dL (ref 65–99)
Potassium: 3.7 mmol/L (ref 3.5–5.1)
Sodium: 139 mmol/L (ref 135–145)

## 2015-10-22 LAB — GLUCOSE, CAPILLARY
Glucose-Capillary: 136 mg/dL — ABNORMAL HIGH (ref 65–99)
Glucose-Capillary: 94 mg/dL (ref 65–99)

## 2015-10-22 LAB — TSH: TSH: 3.77 u[IU]/mL (ref 0.350–4.500)

## 2015-10-22 LAB — TROPONIN I: Troponin I: <0.03 ng/mL (ref <0.031)

## 2015-10-22 MED ORDER — ONDANSETRON HCL 4 MG PO TABS: 4.0000 mg | ORAL_TABLET | Freq: Four times a day (QID) | ORAL | Status: DC | PRN

## 2015-10-22 MED ORDER — ENOXAPARIN SODIUM 40 MG/0.4ML ~~LOC~~ SOLN: 40.0000 mg | SUBCUTANEOUS | Status: DC

## 2015-10-22 MED ORDER — SODIUM CHLORIDE 0.9% FLUSH: 3.0000 mL | Freq: Two times a day (BID) | INTRAVENOUS | Status: DC

## 2015-10-22 MED ORDER — ALPRAZOLAM 0.5 MG PO TABS
0.5000 mg | ORAL_TABLET | Freq: Three times a day (TID) | ORAL | Status: DC | PRN
  Administered 2015-10-22: 0.5 mg via ORAL
  Filled 2015-10-22: qty 1

## 2015-10-22 MED ORDER — SODIUM CHLORIDE 0.9 % IV SOLN: INTRAVENOUS | Status: DC

## 2015-10-22 MED ORDER — HEPARIN SODIUM (PORCINE) 5000 UNIT/ML IJ SOLN
5000.0000 [IU] | Freq: Three times a day (TID) | INTRAMUSCULAR | Status: DC
  Administered 2015-10-22: 5000 [IU] via SUBCUTANEOUS
  Filled 2015-10-22: qty 1

## 2015-10-22 MED ORDER — ACETAMINOPHEN 650 MG RE SUPP: 650.0000 mg | Freq: Four times a day (QID) | RECTAL | Status: DC | PRN

## 2015-10-22 MED ORDER — ACETAMINOPHEN 325 MG PO TABS
650.0000 mg | ORAL_TABLET | Freq: Four times a day (QID) | ORAL | Status: DC | PRN
  Administered 2015-10-22: 650 mg via ORAL
  Filled 2015-10-22: qty 2

## 2015-10-22 MED ORDER — LATANOPROST 0.005 % OP SOLN
1.0000 [drp] | Freq: Every day | OPHTHALMIC | Status: DC
  Administered 2015-10-22: 1 [drp] via OPHTHALMIC
  Filled 2015-10-22: qty 2.5

## 2015-10-22 MED ORDER — ONDANSETRON HCL 4 MG/2ML IJ SOLN
4.0000 mg | Freq: Four times a day (QID) | INTRAMUSCULAR | Status: DC | PRN
Start: 2015-10-22 — End: 2015-10-22

## 2015-10-22 MED ORDER — POLYETHYLENE GLYCOL 3350 17 G PO PACK: 17.0000 g | PACK | Freq: Every day | ORAL | Status: DC | PRN

## 2015-10-22 MED ORDER — PRAVASTATIN SODIUM 40 MG PO TABS
20.0000 mg | ORAL_TABLET | Freq: Every day | ORAL | Status: DC
  Administered 2015-10-22: 20 mg via ORAL
  Filled 2015-10-22 (×2): qty 1

## 2015-10-22 MED ORDER — LEVOTHYROXINE SODIUM 50 MCG PO TABS
50.0000 ug | ORAL_TABLET | ORAL | Status: DC
  Administered 2015-10-22: 50 ug via ORAL
  Filled 2015-10-22: qty 1

## 2015-10-22 MED ORDER — FEBUXOSTAT 40 MG PO TABS
40.0000 mg | ORAL_TABLET | Freq: Every day | ORAL | Status: DC
  Administered 2015-10-22: 40 mg via ORAL
  Filled 2015-10-22: qty 1

## 2015-10-22 MED ORDER — INSULIN ASPART 100 UNIT/ML ~~LOC~~ SOLN: 0.0000 [IU] | Freq: Three times a day (TID) | SUBCUTANEOUS | Status: DC

## 2015-10-22 MED ORDER — GABAPENTIN 300 MG PO CAPS
300.0000 mg | ORAL_CAPSULE | Freq: Every day | ORAL | Status: DC
  Administered 2015-10-22: 300 mg via ORAL
  Filled 2015-10-22: qty 1

## 2015-10-22 MED ORDER — LEVOTHYROXINE SODIUM 50 MCG PO TABS: 100.0000 ug | ORAL_TABLET | ORAL | Status: DC

## 2015-10-22 NOTE — H&P (Signed)
History and Physical    [name] [id number] DOB: 02/27/1946 DOA: 10/21/2015  PCP: Donnajean Lopes, MD   Patient coming from: Home   Chief Complaint: Syncopal event   HPI: [name] is a 70 y.o. female with medical history significant for hypertension, type 2 diabetes mellitus, hyperlipidemia, hypothyroidism, and chronic kidney disease stage III who presents to the ED following a syncopal episode just prior to arrival. Patient reports pain in her usual state of health upon wakening this morning and was at a fast food restaurant when she suddenly became very lightheaded and felt as though she would lose consciousness. Another customer asked if she was okay, patient stated that she wasn't and was holding onto a garbage can as not to fall over, and was helped to the ground as she lost consciousness for several seconds. There was no head strike involved, no apparent injury, and no seizure-like activity reported. She had never experienced anything similar previously. She denies any preceding symptoms, but has subsequently experienced a vague "pressure-like" sensation over the anterior chest. She denies headaches, change in vision or hearing, also coordination, or focal numbness or weakness. Patient denies chest pain per se, or palpitations. She reports occasional lower extremity edema, but none now. She denies any nausea surrounding the episode and denies vomiting or diarrhea. She's had no recent illness, fevers, or chills. She denies shortness of breath or cough.   ED Course: Upon arrival to the ED, patient is found to be saturating well on room air and with vital signs stable. EKG demonstrates a sinus rhythm with no significant change from priors. Chest x-ray is negative for acute cardiopulmonary disease. CMP is notable for serum creatinine 1.37 which appears to be consistent with her baseline. CBC is notable for hemoglobin of 10.1 with normal MCV, also apparently stable relative to recent  prior measurements. Troponin was undetectable, and more than 3 hours later, still undetectable. Patient was given a 1 L normal saline bolus in the emergency department and attempted to ambulate, but became presyncopal upon standing. She has remained hemodynamically stable in the emergency department and will be observed on the telemetry unit for ongoing evaluation and management of a syncopal episode with concern for possible cardiac etiology.  Review of Systems:  All other systems reviewed and apart from HPI, are negative.  Past Medical History  Diagnosis Date  . Glaucoma     uses Eye Drops  . Arthritis   . PONV (postoperative nausea and vomiting)   . Wolf-Parkinson-White syndrome     takes Atenolol daily  . Anxiety     takes Xanax daily as needed  . Hypothyroidism     takes Synthroid daily  . Diabetes mellitus     takes Byetta and Invokana daily  . Gout     takes Uloric daily  . Hypertension     takes HCTZ daily as well as Aldactone  . Pneumonia     many yrs ago  . Bronchitis     a month ago  . Weakness     numbness and tingling in right hip/eg/  . Chronic back pain     stenosis  . Chronic kidney disease     stage 3-sees Dr.J Patel  . History of colon polyps   . Stress incontinence   . Anemia     hx of  . History of blood transfusion     no abnormal reaction     Past Surgical History  Procedure Laterality Date  .  Thyroid surgery    . Abdominal hysterectomy    . Skin biopsy    . Cholecystectomy  1981  . Knee arthroscopy  2009    rt  . Ankle arthroplasty  2010    lt  . Appendectomy    . Colonoscopy    . Shoulder arthroscopy with rotator cuff repair and subacromial decompression Right 06/10/2012    Procedure: SHOULDER ARTHROSCOPY WITH ROTATOR CUFF REPAIR AND SUBACROMIAL DECOMPRESSION;  Surgeon: Lorn Junes, MD;  Location: [facility];  Service: Orthopedics;  Laterality: Right;  RIGHT SHOULDER ARTHROSCOPY, DECOMPRESSION SUBACROMIAL PARTIAL  ACROMIOPLASTY WITH CORACOACROMIAL RELEASE,, DISTAL CLAVICULECTOMY , ROTATOR CUFF debriedment  . Raz procedure  10+yrs ago  . Carpal tunnel release Bilateral   . Cataract surgery Bilateral   . Rotator cuff repair      x 4  . Breast enhancement surgery Bilateral   . Cesarean section      x 3  . Breast biopsy    . Cardiac catheterization  >54yrs ago  . Back surgery  2006    lumb fusion  . Back surgery  08/16/2014    L3-4 fusion     reports that she has quit smoking. She does not have any smokeless tobacco history on file. She reports that she does not drink alcohol. Her drug history is not on file.  Allergies  Allergen Reactions  . Contrast Media [Iodinated Diagnostic Agents] Shortness Of Breath and Other (See Comments)    bp elevated  . Oxycodone Nausea And Vomiting  . Adhesive [Tape] Other (See Comments)    Pulls skin off  . Allopurinol Diarrhea  . Codeine Nausea And Vomiting  . Colchicine Diarrhea  . Esomeprazole Magnesium Hives  . Hydrocodone Nausea And Vomiting  . Meperidine Hcl Nausea And Vomiting  . Pentazocine Lactate Nausea And Vomiting    Family History  Problem Relation Age of Onset  . Hypertension    . Arthritis    . Diabetes Mother   . Heart attack Father      Prior to Admission medications   Medication Sig Start Date End Date Taking? Authorizing Provider  ALPRAZolam Duanne Moron) 0.5 MG tablet Take 0.5 mg by mouth 3 (three) times daily as needed for anxiety.    Yes Historical Provider, MD  febuxostat (ULORIC) 40 MG tablet Take 40 mg by mouth daily.    Yes Historical Provider, MD  furosemide (LASIX) 20 MG tablet Take 20 mg by mouth daily as needed for fluid.  10/10/15  Yes Historical Provider, MD  gabapentin (NEURONTIN) 300 MG capsule Take 300 mg by mouth at bedtime.   Yes Historical Provider, MD  latanoprost (XALATAN) 0.005 % ophthalmic solution Place 1 drop into both eyes at bedtime. Reported on 10/21/2015   Yes Historical Provider, MD  levothyroxine (SYNTHROID,  LEVOTHROID) 50 MCG tablet Take 50-100 mcg by mouth daily. Take 50 mcg daily, except 100 mcg by mouth on Sunday   Yes Historical Provider, MD  pravastatin (PRAVACHOL) 20 MG tablet Take 20 mg by mouth daily. 12/07/14  Yes Historical Provider, MD  spironolactone (ALDACTONE) 50 MG tablet Take 50 mg by mouth daily.    Yes Historical Provider, MD  TRULICITY [id number] [id number] SOPN Inject 0.5 mLs into the skin every 7 (seven) days. 10/13/15  Yes Historical Provider, MD  HYDROmorphone (DILAUDID) 2 MG tablet Take 0.5-1 tablets (1-2 mg total) by mouth every 4 (four) hours as needed (pain). Patient not taking: Reported on 12/18/2014 08/18/14   Jovita Gamma, MD  Physical Exam: Filed Vitals:   10/21/15 2145 10/21/15 2200 10/21/15 2230 10/21/15 2315  BP: 130/81 134/82 123/80   Pulse: 78 69 70 55  Resp: 20 20 18 22   SpO2: 99% 98% 99% 100%      Constitutional: NAD, calm, comfortable Eyes: PERTLA, lids and conjunctivae normal ENMT: Mucous membranes are moist. Posterior pharynx clear of any exudate or lesions.   Neck: normal, supple, no masses, no thyromegaly Respiratory: clear to auscultation bilaterally, no wheezing, no crackles. Normal respiratory effort. No accessory muscle use.  Cardiovascular: S1 & S2 heard, regular rate and rhythm, no significant murmurs / rubs / gallops. No extremity edema. 2+ pedal pulses. No carotid bruits. No significant JVD. Abdomen: No distension, no tenderness, no masses palpated. Bowel sounds normal.  Musculoskeletal: no clubbing / cyanosis. No joint deformity upper and lower extremities. Normal muscle tone.  Skin: no significant rashes, lesions, ulcers. Warm, dry, well-perfused. Neurologic: CN 2-12 grossly intact. Sensation intact, DTR normal. Strength 5/5 in all 4 limbs.  Psychiatric: Normal judgment and insight. Alert and oriented x 3. Normal mood and affect.     Labs on Admission: I have personally reviewed following labs and imaging studies  CBC:  Recent Labs Lab  10/21/15 1851  WBC 6.2  NEUTROABS 3.1  HGB 10.1*  HCT 32.5*  MCV 85.5  PLT [id number]   Basic Metabolic Panel:  Recent Labs Lab 10/21/15 1851  NA 138  K 3.9  CL 103  CO2 29  GLUCOSE 100*  BUN 21*  CREATININE 1.37*  CALCIUM 9.6   GFR: CrCl cannot be calculated (Unknown ideal weight.). Liver Function Tests:  Recent Labs Lab 10/21/15 1851  AST 19  ALT 19  ALKPHOS 109  BILITOT <0.1*  PROT 7.6  ALBUMIN 3.8   No results for input(s): LIPASE, AMYLASE in the last 168 hours. No results for input(s): AMMONIA in the last 168 hours. Coagulation Profile: No results for input(s): INR, PROTIME in the last 168 hours. Cardiac Enzymes:  Recent Labs Lab 10/21/15 1851  TROPONINI <0.03   BNP (last 3 results) No results for input(s): PROBNP in the last 8760 hours. HbA1C: No results for input(s): HGBA1C in the last 72 hours. CBG:  Recent Labs Lab 10/21/15 1853  GLUCAP 100*   Lipid Profile: No results for input(s): CHOL, HDL, LDLCALC, TRIG, CHOLHDL, LDLDIRECT in the last 72 hours. Thyroid Function Tests: No results for input(s): TSH, T4TOTAL, FREET4, T3FREE, THYROIDAB in the last 72 hours. Anemia Panel: No results for input(s): VITAMINB12, FOLATE, FERRITIN, TIBC, IRON, RETICCTPCT in the last 72 hours. Urine analysis:    Component Value Date/Time   COLORURINE YELLOW 01/27/2009 2037   APPEARANCEUR CLOUDY* 01/27/2009 2037   LABSPEC 1.023 01/27/2009 2037   PHURINE 5.5 01/27/2009 2037   GLUCOSEU NEGATIVE 01/27/2009 2037   HGBUR NEGATIVE 01/27/2009 2037   BILIRUBINUR NEGATIVE 01/27/2009 2037   KETONESUR 15* 01/27/2009 2037   PROTEINUR 30* 01/27/2009 2037   UROBILINOGEN 1.0 01/27/2009 2037   NITRITE NEGATIVE 01/27/2009 2037   LEUKOCYTESUR NEGATIVE 01/27/2009 2037   Sepsis Labs: @LABRCNTIP (procalcitonin:4,lacticidven:4) )No results found for this or any previous visit (from the past 240 hour(s)).   Radiological Exams on Admission: Dg Chest 2 View  10/21/2015  CLINICAL  DATA:  Acute onset of syncope.  Initial encounter. EXAM: CHEST  2 VIEW COMPARISON:  Chest radiograph performed 08/09/2014 FINDINGS: The lungs are well-aerated and clear. There is no evidence of focal opacification, pleural effusion or pneumothorax. The heart is normal in size; the mediastinal contour  is within normal limits. No acute osseous abnormalities are seen. Lumbar spinal fusion hardware is partially imaged. Clips are noted within the right upper quadrant, reflecting prior cholecystectomy. IMPRESSION: No acute cardiopulmonary process seen. Electronically Signed   By: Garald Balding M.D.   On: 10/21/2015 19:50    EKG: Independently reviewed. Sinus rhythm   Assessment/Plan  1. Syncope  - Etiology uncertain, likely neurally-mediated, but cardiac etio a concern given the associated vague chest discomfort   - Unfortunately, orthostatic vitals were never obtained as ordered and pt has been fluid-resuscitated  - Monitor on telemetry for arrhythmia, check TTE for structural etiology  - Obtain serial troponin measurements - No focal deficit to warrant neuroimaging; no features to suggest PE   2. CKD stage III  - SCr 1.37 on admission, appears to be consistent with her baseline  - Followed by nephrology outpatient and has had her Alleve stopped recently  - Given 1 L bolus NS in ED, followed by NS at 125 cc/hr  - Holding Lasix for now, resume as appropriate  - Avoid nephrotoxins, renally-dose medications   3. Hypothyroidism - Appears to be stable  - Continue current-dose Synthroid    4. Hypertension  - At goal at time of admission  - Recently had atenolol, Aldactone, and HCTZ discontinued d/t hypotension  - Currently on Lasix only which is currently being held    5. Type II DM  - No A1c in EMR  - Managed with Trulicity at home, will hold this here  - Check CBG with meals and qHS  - Low-intensity sliding-scale insulin to start; adjust prn   - Check A1c, pending    6. Anxiety  -  Appears stable  - Continue current management with prn Xanax     DVT prophylaxis: sq heparin  Code Status: Full  Family Communication: Discussed with patient  Disposition Plan: Observe on telemetry  Consults called: None   Admission status: Observation    Vianne Bulls, MD [facility] Pager [phone]  If 7PM-7AM, please contact night-coverage [email or web address] Password [email or web address]  10/22/2015, 12:00 AM

## 2015-10-22 NOTE — Discharge Instructions (Signed)

## 2015-10-22 NOTE — Progress Notes (Signed)
Pt states she has not had any lightheadedness or dizziness since last noc. Ambulating with SBA only. VSS.  Clovis Riley, RN

## 2015-10-22 NOTE — Discharge Summary (Signed)
Physician Discharge Summary  [name] [id number] DOB: [birth year] DOA: 10/21/2015  PCP: Donnajean Lopes, MD  Admit date: 10/21/2015 Discharge date: 10/22/2015   Recommendations for Outpatient Follow-Up:   1. Recommend reevaluation within 1 week for follow-up of syncope. Note: Patient instructed to discontinue Lasix. Repeat blood pressure at next visit and consider discontinuation of spironolactone if she is still having problems with dizziness.   Discharge Diagnosis:   Principal Problem:    Syncope Active Problems:    ANEMIA, IRON DEFICIENCY    Essential hypertension    Coronary atherosclerosis    Anxiety    GERD (gastroesophageal reflux disease)    Non-insulin dependent type 2 diabetes mellitus ([place])    Hypothyroidism    CKD (chronic kidney disease), stage III   Discharge disposition:  Home.    Discharge Condition: Improved.  Diet recommendation: Low sodium, heart healthy.  Carbohydrate-modified.    History of Present Illness:   [name] is an 70 y.o. female with medical history significant for hypertension, type 2 diabetes mellitus, hyperlipidemia, hypothyroidism, and chronic kidney disease stage III who was admitted 10/21/15 after suffering from a syncopal episode with loss of consciousness for several seconds. Had anterior chest discomfort when she came to. Upon initial evaluation in the ED, EKG was unremarkable. Troponin was undetectable. She was admitted for observation due to ongoing symptoms of presyncope with standing.   Hospital Course by Problem:   Principal Problem:  Syncope Troponins negative. TSH WNL. Given IV fluids in the ED. Unfortunately, orthostatics not checked on admission, and she is not currently orthostatic, but I suspect that after taking Lasix on the morning of admission, she had a drop in her blood pressure from transient hypotension. She notes that her treating M.D. has recently discontinued other antihypertensives  secondary to hypotension. Troponins negative. No events on telemetry. Recommend close follow-up with PCP. The patient understands she is to return to the hospital if she has recurrent symptoms.  Active Problems:  ANEMIA, IRON DEFICIENCY Hemoglobin stable at 10.1.   Essential hypertension Antihypertensives currently on hold. Would discontinue Lasix and resume spironolactone only.   Coronary atherosclerosis Troponins negative 2.   Anxiety Continue Xanax as needed.   GERD (gastroesophageal reflux disease)   Non-insulin dependent type 2 diabetes mellitus ([place]) Home medications currently on hold. Currently being managed with SSI, insulin sensitive scale. CBG range 100-136. Okay to resume usual medicines at discharge.   Hypothyroidism Continue Synthroid.   CKD (chronic kidney disease), stage III Creatinine consistent with usual baseline values.   Medical Consultants:    None.   Discharge Exam:   Filed Vitals:   10/22/15 0037 10/22/15 0433  BP: 136/82   Pulse: 59 68  Temp: 98 F (36.7 C) 98.3 F (36.8 C)  Resp: 20 18   Filed Vitals:   10/21/15 2230 10/21/15 2315 10/22/15 0037 10/22/15 0433  BP: 123/80  136/82   Pulse: 70 55 59 68  Temp:   98 F (36.7 C) 98.3 F (36.8 C)  TempSrc:   Oral Oral  Resp: 18 22 20 18   Weight:   103 kg (227 lb 1.2 oz)   SpO2: 99% 100% 100%    General exam: Appears calm and comfortable.  Respiratory system: Clear to auscultation. Respiratory effort normal. Cardiovascular system: S1 & S2 heard, RRR. No JVD, rubs, gallops or clicks. No murmurs. Gastrointestinal system: Abdomen is nondistended, soft and nontender. No organomegaly or masses felt. Normal bowel sounds heard. Central nervous system: Alert and oriented. No  focal neurological deficits. Extremities: No clubbing, edema, or cyanosis. Skin: No rashes, lesions or ulcers Psychiatry: Judgement and insight appear normal. Mood & affect appropriate.   The results of  significant diagnostics from this hospitalization (including imaging, microbiology, ancillary and laboratory) are listed below for reference.     Procedures and Diagnostic Studies:   Dg Chest 2 View  10/21/2015  CLINICAL DATA:  Acute onset of syncope.  Initial encounter. EXAM: CHEST  2 VIEW COMPARISON:  Chest radiograph performed 08/09/2014 FINDINGS: The lungs are well-aerated and clear. There is no evidence of focal opacification, pleural effusion or pneumothorax. The heart is normal in size; the mediastinal contour is within normal limits. No acute osseous abnormalities are seen. Lumbar spinal fusion hardware is partially imaged. Clips are noted within the right upper quadrant, reflecting prior cholecystectomy. IMPRESSION: No acute cardiopulmonary process seen. Electronically Signed   By: Garald Balding M.D.   On: 10/21/2015 19:50     Labs:   Basic Metabolic Panel:  Recent Labs Lab 10/21/15 1851 10/22/15 0109  NA 138 139  K 3.9 3.7  CL 103 107  CO2 29 26  GLUCOSE 100* 98  BUN 21* 19  CREATININE 1.37* 1.20*  CALCIUM 9.6 9.4   GFR CrCl cannot be calculated (Unknown ideal weight.). Liver Function Tests:  Recent Labs Lab 10/21/15 1851  AST 19  ALT 19  ALKPHOS 109  BILITOT <0.1*  PROT 7.6  ALBUMIN 3.8    CBC:  Recent Labs Lab 10/21/15 1851  WBC 6.2  NEUTROABS 3.1  HGB 10.1*  HCT 32.5*  MCV 85.5  PLT 223   Cardiac Enzymes:  Recent Labs Lab 10/21/15 1851 10/22/15 0109  TROPONINI <0.03 <0.03  CBG:  Recent Labs Lab 10/21/15 1853 10/22/15 0557 10/22/15 1101  GLUCAP 100* 136* 94   Thyroid function studies  Recent Labs  10/22/15 0109  TSH 3.770     Discharge Instructions:   Discharge Instructions    Call MD for:  extreme fatigue    Complete by:  As directed      Call MD for:  persistant dizziness or light-headedness    Complete by:  As directed      Diet - low sodium heart healthy    Complete by:  As directed      Diet Carb Modified     Complete by:  As directed      Increase activity slowly    Complete by:  As directed             Medication List    STOP taking these medications        furosemide 20 MG tablet  Commonly known as:  LASIX     HYDROmorphone 2 MG tablet  Commonly known as:  DILAUDID      TAKE these medications        ALPRAZolam 0.5 MG tablet  Commonly known as:  XANAX  Take 0.5 mg by mouth 3 (three) times daily as needed for anxiety.     febuxostat 40 MG tablet  Commonly known as:  ULORIC  Take 40 mg by mouth daily.     gabapentin 300 MG capsule  Commonly known as:  NEURONTIN  Take 300 mg by mouth at bedtime.     latanoprost 0.005 % ophthalmic solution  Commonly known as:  XALATAN  Place 1 drop into both eyes at bedtime. Reported on 10/21/2015     levothyroxine 50 MCG tablet  Commonly known as:  SYNTHROID, LEVOTHROID  Take  50-100 mcg by mouth daily. Take 50 mcg daily, except 100 mcg by mouth on Sunday     pravastatin 20 MG tablet  Commonly known as:  PRAVACHOL  Take 20 mg by mouth daily.     spironolactone 50 MG tablet  Commonly known as:  ALDACTONE  Take 50 mg by mouth daily.     TRULICITY [id number] Sopn  Generic drug:  Dulaglutide  Inject 0.5 mLs into the skin every 7 (seven) days.           Follow-up Information    Follow up with Donnajean Lopes, MD. Schedule an appointment as soon as possible for a visit in 1 week.   Specialty:  Internal Medicine   Why:  Hospital follow up of syncope.   Contact information:   [address] [place] [address] [phone]        Time coordinating discharge: 25 minutes.  Signed:  RAMA,CHRISTINA  Pager [phone] [facility] 10/22/2015, 2:04 PM

## 2015-10-22 NOTE — Progress Notes (Signed)
Discharge instructions given. Pt verbalized understanding and all questions were answered.  

## 2015-10-24 LAB — HEMOGLOBIN A1C
Hgb A1c MFr Bld: 5.6 % (ref 4.8–5.6)
Mean Plasma Glucose: 114 mg/dL

## 2015-10-26 MED FILL — ATENOLOL 25 MG TABLET: 25 | 90 days supply | Qty: 90 | Fill #0

## 2015-10-26 MED FILL — SYNTHROID 50 MCG TABLET: 50 | 90 days supply | Qty: 104 | Fill #0

## 2015-10-31 ENCOUNTER — Ambulatory Visit
Admission: RE | Admit: 2015-10-31 | Discharge: 2015-10-31 | Disposition: A | Discharge status: Home / Self Care | Payer: Medicare Other | Source: Ambulatory Visit

## 2015-10-31 DIAGNOSIS — Z1231 Encounter for screening mammogram for malignant neoplasm of breast: Secondary | ICD-10-CM

## 2015-11-04 MED FILL — FUROSEMIDE 20 MG TABLET: 20 | 30 days supply | Qty: 30 | Fill #0

## 2015-11-07 ENCOUNTER — Ambulatory Visit: Payer: Medicare Other | Admitting: Physician Assistant

## 2015-11-18 MED FILL — ULORIC 40 MG TABLET: 40 | 30 days supply | Qty: 30 | Fill #0

## 2015-11-25 ENCOUNTER — Ambulatory Visit (INDEPENDENT_AMBULATORY_CARE_PROVIDER_SITE_OTHER): Payer: Medicare Other | Admitting: Cardiology

## 2015-11-25 VITALS — BP 100/70 | HR 60 | Ht 63.0 in | Wt 222.2 lb

## 2015-11-25 DIAGNOSIS — R55 Syncope and collapse: Secondary | ICD-10-CM | POA: Diagnosis not present

## 2015-11-25 DIAGNOSIS — R002 Palpitations: Secondary | ICD-10-CM | POA: Diagnosis not present

## 2015-11-25 DIAGNOSIS — I456 Pre-excitation syndrome: Secondary | ICD-10-CM

## 2015-11-25 LAB — BASIC METABOLIC PANEL
BUN: 28 mg/dL — AB (ref 7–25)
CHLORIDE: 105 mmol/L (ref 98–110)
CO2: 24 mmol/L (ref 20–31)
Calcium: 9.6 mg/dL (ref 8.6–10.4)
Creat: 1.37 mg/dL — ABNORMAL HIGH (ref 0.50–0.99)
GLUCOSE: 91 mg/dL (ref 65–99)
POTASSIUM: 4.4 mmol/L (ref 3.5–5.3)
Sodium: 141 mmol/L (ref 135–146)

## 2015-11-25 LAB — CBC
HEMATOCRIT: 32.4 % — AB (ref 35.0–45.0)
HEMOGLOBIN: 10.7 g/dL — AB (ref 11.7–15.5)
MCH: 27.8 pg (ref 27.0–33.0)
MCHC: 33 g/dL (ref 32.0–36.0)
MCV: 84.2 fL (ref 80.0–100.0)
MPV: 9.3 fL (ref 7.5–12.5)
Platelets: 236 10*3/uL (ref 140–400)
RBC: 3.85 MIL/uL (ref 3.80–5.10)
RDW: 13.7 % (ref 11.0–15.0)
WBC: 5.1 10*3/uL (ref 3.8–10.8)

## 2015-11-25 LAB — TSH: TSH: 1.45 mIU/L

## 2015-11-25 LAB — MAGNESIUM: Magnesium: 1.9 mg/dL (ref 1.5–2.5)

## 2015-11-25 NOTE — Patient Instructions (Signed)
Medication Instructions:  Your physician recommends that you continue on your current medications as directed. Please refer to the Current Medication list given to you today.  Labwork: Your physician recommends that you have lab work TODAY: BMET, TSH, CBC, MAGNESIUM  Testing/Procedures: Your physician has requested that you have an echocardiogram. Echocardiography is a painless test that uses sound waves to create images of your heart. It provides your doctor with information about the size and shape of your heart and how well your heart's chambers and valves are working. This procedure takes approximately one hour. There are no restrictions for this procedure.  Your physician has recommended that you wear an event monitor. Event monitors are medical devices that record the heart's electrical activity. Doctors most often [place] these monitors to diagnose arrhythmias. Arrhythmias are problems with the speed or rhythm of the heartbeat. The monitor is a small, portable device. You can wear one while you do your normal daily activities. This is usually used to diagnose what is causing palpitations/syncope (passing out).   Follow-Up: Your physician recommends that you schedule a follow-up appointment in: [place]   Any Other Special Instructions Will Be Listed Below (If Applicable). DO NOT FORGET TO PICK UP COMPRESSION STOCKINGS AND WEAR THEM.    If you need a refill on your cardiac medications before your next appointment, please call your pharmacy.

## 2015-11-25 NOTE — Progress Notes (Signed)
11/25/2015 [name]   1945/09/17  [id number]  Primary Physician Donnajean Lopes, MD Primary Cardiologist: Dr. Tamala Julian   Reason for Visit/CC: Palpitations; syncope  HPI:  70 y/o female who has been followed by Dr. Tamala Julian. 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 Catheterization for atypical chest pain greater than 10 years ago revealed nonobstructive coronary disease. NST in 2013 was nonischemic. LV function was normal. She was last seen by Dr. Tamala Julian 05/2014 for surgical clearance prior to lumbar disc surgery and total knee replacement. She was cleared from a cardiac standpoint by Dr. Tamala Julian and did well.   She presents back to clinic today for evaluation after being seen in the ED for syncope. Records workup in the ED, was felt to be slightly hypotensive and blood pressure improved with IV fluids. Other workup was fairly unremarkable. Lasix was discontinued at this was felt to be slightly responsible for her hypotension. She was also instructed to get fitted for compression stockings. She was instructed to follow up in our clinic further evaluation.  Since being seen in the ED, she continues to have occasional symptoms of near syncope. No frank syncope. She notes recurrence of tachypalpitations and mild dyspnea. No significant chest discomfort. Pressure in clinic today is soft but stable at 100/70. Orthostatic vital signs were checked today she does not appear to be orthostatic.   Current Outpatient Prescriptions  Medication Sig Dispense Refill  . ALPRAZolam (XANAX) 0.5 MG tablet Take 0.5 mg by mouth 3 (three) times daily as needed for anxiety.     . febuxostat (ULORIC) 40 MG tablet Take 40 mg by mouth daily.     Marland Kitchen gabapentin (NEURONTIN) 300 MG capsule Take 300 mg by mouth at bedtime.    Marland Kitchen latanoprost (XALATAN) 0.005 % ophthalmic solution Place 1 drop into both eyes at bedtime. Reported on 10/21/2015    . levothyroxine (SYNTHROID, LEVOTHROID) 50 MCG tablet Take  50-100 mcg by mouth daily. Take 50 mcg daily, except 100 mcg by mouth on Sunday    . pravastatin (PRAVACHOL) 20 MG tablet Take 20 mg by mouth daily.    Marland Kitchen spironolactone (ALDACTONE) 50 MG tablet Take 50 mg by mouth daily.     . TRULICITY [id number] [id number] SOPN Inject 0.5 mLs into the skin every 7 (seven) days.  6   No current facility-administered medications for this visit.     Allergies  Allergen Reactions  . Contrast Media [Iodinated Diagnostic Agents] Shortness Of Breath and Other (See Comments)    bp elevated  . Oxycodone Nausea And Vomiting  . Adhesive [Tape] Other (See Comments)    Pulls skin off  . Allopurinol Diarrhea  . Codeine Nausea And Vomiting  . Colchicine Diarrhea  . Esomeprazole Magnesium Hives  . Hydrocodone Nausea And Vomiting  . Meperidine Hcl Nausea And Vomiting  . Pentazocine Lactate Nausea And Vomiting    Social History   Social History  . Marital status: Divorced    Spouse name: N/A  . Number of children: N/A  . Years of education: N/A   Occupational History  . Not on file.   Social History Main Topics  . Smoking status: Former Research scientist (life sciences)  . Smokeless tobacco: Not on file     Comment: quit smoking 7yrs ago  . Alcohol use No  . Drug use: Unknown  . Sexual activity: Not on file   Other Topics Concern  . Not on file   Social History Narrative  . No narrative on  file     Review of Systems: General: negative for chills, fever, night sweats or weight changes.  Cardiovascular: negative for chest pain, dyspnea on exertion, edema, orthopnea, palpitations, paroxysmal nocturnal dyspnea or shortness of breath Dermatological: negative for rash Respiratory: negative for cough or wheezing Urologic: negative for hematuria Abdominal: negative for nausea, vomiting, diarrhea, bright red blood per rectum, melena, or hematemesis Neurologic: negative for visual changes, syncope, or dizziness All other systems reviewed and are otherwise negative except as noted  above.    Blood pressure 100/70, pulse 60, height 5\' 3"  (1.6 m), weight 222 lb 3.2 oz (100.8 kg).  General appearance: alert, cooperative and no distress Neck: no carotid bruit and no JVD Lungs: clear to auscultation bilaterally Heart: regular rate and rhythm, S1, S2 normal, no murmur, click, rub or gallop Extremities: no LEE Pulses: 2+ and symmetric Skin: warm and dry Neurologic: Grossly normal  EKG NSR  ASSESSMENT AND PLAN:   70 year old female with prior history of  WPW, previously asymptomatic, who presents with a several week history of near syncope, tachypalpitations and mild dyspnea. Her recent syncopal spell was felt to be related to orthostatic hypotension which improved with IV fluids in the emergency department. Her Lasix was discontinued. Since that time, she continues to note continued symptoms especially tachypalpitations. Her blood pressure today is soft but stable at 100/70. Pulse rate is 60 bpm. EKG shows sinus rhythm. Given her recent symptoms and WPW, we will obtain a 30 day cardiac monitor to assess for worrisome arrhythmias. We will also obtain a 2-D echocardiogram to assess her LV function and any changes in overall cardiac function. Also check a TSH, CBC, basic metabolic panel and magnesium level.  PLAN  F/u in 1 month   [name] 11/25/2015 8:45 AM

## 2015-11-28 ENCOUNTER — Encounter: Payer: Self-pay | Admitting: Cardiology

## 2015-11-29 ENCOUNTER — Telehealth: Payer: Self-pay | Admitting: Interventional Cardiology

## 2015-11-29 MED FILL — TRULICITY 0.75 MG/0.5 ML PE: 0.75 | 28 days supply | Qty: 2 | Fill #3

## 2015-11-29 NOTE — Telephone Encounter (Signed)
Informed pt of lab results. Pt verbalized understanding. 

## 2015-11-29 NOTE — Telephone Encounter (Signed)
NEW MESSAGE   Pt verbalized that she is returning call to rn for lab results

## 2015-11-30 MED FILL — PRAVASTATIN SODIUM 20 MG TA: 20 | 90 days supply | Qty: 90 | Fill #0

## 2015-12-06 ENCOUNTER — Ambulatory Visit (INDEPENDENT_AMBULATORY_CARE_PROVIDER_SITE_OTHER): Payer: Medicare Other

## 2015-12-06 ENCOUNTER — Other Ambulatory Visit: Payer: Self-pay

## 2015-12-06 ENCOUNTER — Encounter: Payer: Self-pay | Admitting: *Deleted

## 2015-12-06 ENCOUNTER — Ambulatory Visit (HOSPITAL_COMMUNITY): Payer: Medicare Other | Attending: Cardiology

## 2015-12-06 ENCOUNTER — Telehealth: Payer: Self-pay | Admitting: *Deleted

## 2015-12-06 DIAGNOSIS — I253 Aneurysm of heart: Secondary | ICD-10-CM | POA: Diagnosis not present

## 2015-12-06 DIAGNOSIS — Z87891 Personal history of nicotine dependence: Secondary | ICD-10-CM | POA: Insufficient documentation

## 2015-12-06 DIAGNOSIS — I1310 Hypertensive heart and chronic kidney disease without heart failure, with stage 1 through stage 4 chronic kidney disease, or unspecified chronic kidney disease: Secondary | ICD-10-CM | POA: Diagnosis not present

## 2015-12-06 DIAGNOSIS — R002 Palpitations: Secondary | ICD-10-CM

## 2015-12-06 DIAGNOSIS — I340 Nonrheumatic mitral (valve) insufficiency: Secondary | ICD-10-CM | POA: Insufficient documentation

## 2015-12-06 DIAGNOSIS — I456 Pre-excitation syndrome: Secondary | ICD-10-CM

## 2015-12-06 DIAGNOSIS — R55 Syncope and collapse: Secondary | ICD-10-CM | POA: Diagnosis not present

## 2015-12-06 DIAGNOSIS — N183 Chronic kidney disease, stage 3 (moderate): Secondary | ICD-10-CM | POA: Insufficient documentation

## 2015-12-06 DIAGNOSIS — I071 Rheumatic tricuspid insufficiency: Secondary | ICD-10-CM | POA: Insufficient documentation

## 2015-12-06 NOTE — Telephone Encounter (Signed)
LMTCB. Will route this to Dr Thompson Caul covering [place] for further follow-up with the pt.

## 2015-12-06 NOTE — Telephone Encounter (Signed)
Pt is here to receive her 30 day event monitor, ordered by Ellen Henri PA, at her 7/28 OV with the pt.  Pt states she is a pt of Dr Tamala Julian, and has seen him for several years.  Pt was advised by her Director and place of work ([place]) to have Dr Tamala Julian write a work restriction letter, stating she may need some downtime to fulfill her duties during [employer] school hours.  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  Pt states that she has been having dizzy spells and pre-syncopal episodes, which is why [place] ordered the event monitor.  Pt states her symptoms worsen when she is in the heat with the kids. Pt states that last school year her Director of [employer] allowed her to take breaks or switch assignments with another teacher, if she had to go out in the heat to watch the kids play.  Pt states that now her Director is requiring written documentation from Dr Tamala Julian stating that the pt should take breaks as needed or restricted from working in the heat, while being worked-up for her cardiac issues and wearing her event monitor.  Pt states that she will be wearing the monitor on the day [employer] starts back on 8/21.  Informed the pt that I will submit this information to Dr Tamala Julian and covering [place] for further review, recommendation, and follow-up with the pt thereafter.  Pt verbalized understanding and agrees with this plan.  Pt gracious for all the assistance provided.

## 2015-12-06 NOTE — Telephone Encounter (Signed)
Left a detailed message for the pt to call back on both contact numbers provided, to ask if she would like to come to the office to pick up her work note, or have [place] mail this to her address.

## 2015-12-06 NOTE — Telephone Encounter (Signed)
Letter has been printed and signed by Dr.Smith, awaiting patients call back to determine if she wants it mailed or if she will come to the office to pick it up.

## 2015-12-06 NOTE — Telephone Encounter (Signed)
Dear Information systems manager,   I write to confirm that [name] (date of birth 06/16/1945) is undergoing cardiac evaluation for recurring episodes of dizziness and weakness that occur with moderate physical activity and sun exposure. I request that she be allowed rest periods when these symptoms occur. Further instruction will follow based upon our findings.   If there are questions, do not hesitate to call.   Sincerely,      Farrel Gordon, M.D.

## 2015-12-07 NOTE — Telephone Encounter (Signed)
Pt picked up letter.

## 2015-12-13 ENCOUNTER — Telehealth: Payer: Self-pay | Admitting: Interventional Cardiology

## 2015-12-13 NOTE — Telephone Encounter (Signed)
New message ° ° ° °Pt verbalized that she is returning call for rn  °

## 2015-12-14 ENCOUNTER — Telehealth: Payer: Self-pay | Admitting: Interventional Cardiology

## 2015-12-14 NOTE — Telephone Encounter (Signed)
New message     Pt would like nurse to call her and tell her the results of her ultrasound. Please call.

## 2015-12-14 NOTE — Telephone Encounter (Signed)
Follow Up:     Returning call from Desiree Lucy from yesterday. 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

## 2015-12-14 NOTE — Telephone Encounter (Signed)
Spoke with pt see tell enc 8/15

## 2015-12-14 NOTE — Telephone Encounter (Signed)
Spoke with pt, pt aware of echo results with verbal understanding.

## 2016-01-16 ENCOUNTER — Telehealth: Payer: Self-pay | Admitting: Interventional Cardiology

## 2016-01-16 NOTE — Telephone Encounter (Signed)
[name] is returning a call .Marland Kitchen Please call   Thanks

## 2016-01-16 NOTE — Telephone Encounter (Signed)
Pt aware of cardiac monitor results with verbal understanding. Pt sts that  she is doing better and doesn't not think she needs to keep the f/u appt scheduled with Brittainy Simmons,PA at the end of the month. She will f/u with cardiology prn, and rqst copies of her recent cardiac testing be fwd to her pcp. Results fwd to pcp.

## 2016-01-16 NOTE — Telephone Encounter (Signed)
F/u Message  Pt returning RN call. Pt states she is home waiting for RN to call back. Please call back to discuss

## 2016-01-16 NOTE — Telephone Encounter (Signed)
-----   Message from Belva Crome, MD sent at 01/11/2016  7:15 PM EDT ----- Let the patient know that the patient know that no significant arrhythmias were noted and there seems to be no car relation between complaints and heart rhythm. A copy will be sent to Donnajean Lopes, MD

## 2016-01-19 ENCOUNTER — Ambulatory Visit: Payer: Medicare Other | Admitting: Cardiology

## 2016-02-01 ENCOUNTER — Telehealth: Payer: Self-pay | Admitting: Interventional Cardiology

## 2016-02-01 NOTE — Telephone Encounter (Signed)
Received clearance form from [facility].  Pt planning to have rt total knee replacement.  Date is pending clearance.  Surgeon is Dr. Elsie Saas.  Clearance will need to be faxed to [phone] ATTN: Sherri.  Will route to Dr. Tamala Julian for surgical clearance.

## 2016-02-01 NOTE — Telephone Encounter (Signed)
Left message to call back  

## 2016-02-01 NOTE — Telephone Encounter (Signed)
Please inquire about cardiac symptoms. If there are none, she is cleared for upcoming orthopedic surgery.

## 2016-02-02 NOTE — Telephone Encounter (Signed)
He is cleared for the upcoming surgery. She should be low risk.

## 2016-02-02 NOTE — Telephone Encounter (Signed)
Pt states she has had 1-2 episodes of dizziness and slight chest tightness since she wore her monitor.  Pt states since monitor was normal she didn't really think much of it.  Says it lasts only a few mins and feels like indigestion.  1 episode was at night and the other occurred while she was in the heat for awhile.  Pt occasionally has some SOB with exertion but says this is rare.  Will route to Dr. Tamala Julian for review and advisement.

## 2016-02-02 NOTE — Telephone Encounter (Signed)
Spoke with pt and informed her she was cleared for surgery.  Faxed clearance over to Dr. Archie Endo office.

## 2016-03-09 MED FILL — PRAVASTATIN SODIUM 20 MG TA: 20 | 90 days supply | Qty: 90 | Fill #1

## 2016-03-15 MED FILL — AZITHROMYCIN 250 MG TABLET: 250 | 5 days supply | Qty: 6 | Fill #0

## 2016-03-21 ENCOUNTER — Encounter (HOSPITAL_COMMUNITY): Payer: Self-pay | Admitting: Physician Assistant

## 2016-03-21 ENCOUNTER — Encounter (HOSPITAL_COMMUNITY): Payer: Self-pay

## 2016-03-21 DIAGNOSIS — M1711 Unilateral primary osteoarthritis, right knee: Secondary | ICD-10-CM | POA: Diagnosis present

## 2016-03-21 NOTE — H&P (Signed)
TOTAL KNEE ADMISSION H&P  Patient is being admitted for right total knee arthroplasty.  Subjective:  Chief Complaint:right knee pain.  HPI: [name], 70 y.o. female, has a history of pain and functional disability in the right knee due to arthritis and has failed non-surgical conservative treatments for greater than 12 weeks to includeNSAID's and/or analgesics, corticosteriod injections, viscosupplementation injections, flexibility and strengthening excercises, supervised PT with diminished ADL's post treatment, use of assistive devices, weight reduction as appropriate and activity modification.  Onset of symptoms was gradual, starting 10 years ago with gradually worsening course since that time. The patient noted prior procedures on the knee to include  arthroscopy and menisectomy on the right knee(s).  Patient currently rates pain in the right knee(s) at 10 out of 10 with activity. Patient has night pain, worsening of pain with activity and weight bearing, pain that interferes with activities of daily living, crepitus and joint swelling.  Patient has evidence of subchondral sclerosis, periarticular osteophytes and joint space narrowing by imaging studies. There is no active infection.  Patient Active Problem List   Diagnosis Date Noted  . Primary localized osteoarthritis of right knee   . CKD (chronic kidney disease), stage III 10/22/2015  . Syncope 10/21/2015  . Non-insulin dependent type 2 diabetes mellitus ([place]) 10/21/2015  . Hypothyroidism 10/21/2015  . 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 Right rotator cuff tear   . Anxiety   . PONV (postoperative nausea and vomiting)   . GERD (gastroesophageal reflux disease)   . Arthritis   . Thyroid disease   . DIVERTICULOSIS-COLON 07/04/2009  . Iron deficiency anemia 03/17/2009  . PERSONAL HX COLONIC POLYPS 03/17/2009  . SKIN RASH 02/23/2009  . DIARRHEA 02/23/2009  . FEVER, HX OF 02/23/2009  . THYROID NODULE, LEFT  02/11/2009  . Glaucoma 02/11/2009  . Coronary atherosclerosis 02/11/2009  . 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 DEGENERATIVE JOINT DISEASE, ANKLE 02/11/2009  . Osteoarthritis of spine 02/11/2009  . Essential hypertension 02/09/2009   Past Medical History:  Diagnosis Date  . Anemia    hx of  . Anxiety    takes Xanax daily as needed  . Arthritis   . Bronchitis    a month ago  . Chronic back pain    stenosis  . Chronic kidney disease    stage 3-sees Dr.J Patel  . Diabetes mellitus    takes Byetta and Invokana daily  . Glaucoma    uses Eye Drops  . Gout    takes Uloric daily  . History of blood transfusion    no abnormal reaction   . History of colon polyps   . Hypertension    takes HCTZ daily as well as Aldactone  . Hypothyroidism    takes Synthroid daily  . Pneumonia    many yrs ago  . PONV (postoperative nausea and vomiting)   . Primary localized osteoarthritis of right knee   . Stress incontinence   . Weakness    numbness and tingling in right hip/eg/  . Wolf-Parkinson-White syndrome    takes Atenolol daily    Past Surgical History:  Procedure Laterality Date  . ABDOMINAL HYSTERECTOMY    . ANKLE ARTHROPLASTY  2010   lt  . APPENDECTOMY    . BACK SURGERY  2006   lumb fusion  . BACK SURGERY  08/16/2014   L3-4 fusion  . BREAST BIOPSY    . BREAST ENHANCEMENT SURGERY Bilateral   . CARDIAC CATHETERIZATION  >67yrs ago  .  CARPAL TUNNEL RELEASE Bilateral   . cataract surgery Bilateral   . CESAREAN SECTION     x 3  . CHOLECYSTECTOMY  1981  . COLONOSCOPY    . KNEE ARTHROSCOPY  2009   rt  . RAZ procedure  10+yrs ago  . ROTATOR CUFF REPAIR     x 4  . SHOULDER ARTHROSCOPY WITH ROTATOR CUFF REPAIR AND SUBACROMIAL DECOMPRESSION Right 06/10/2012   Procedure: SHOULDER ARTHROSCOPY WITH ROTATOR CUFF REPAIR AND SUBACROMIAL DECOMPRESSION;  Surgeon: Lorn Junes, MD;  Location: [facility];  Service: Orthopedics;  Laterality: Right;   RIGHT SHOULDER ARTHROSCOPY, DECOMPRESSION SUBACROMIAL PARTIAL ACROMIOPLASTY WITH CORACOACROMIAL RELEASE,, DISTAL CLAVICULECTOMY , ROTATOR CUFF debriedment  . SKIN BIOPSY    . THYROID SURGERY      No current facility-administered medications for this encounter.   Current Outpatient Prescriptions:  .  ALPRAZolam (XANAX) 0.5 MG tablet, Take 0.5 mg by mouth 3 (three) times daily as needed for anxiety. , Disp: , Rfl:  .  atenolol (TENORMIN) 50 MG tablet, Take 50 mg by mouth daily., Disp: , Rfl:  .  azithromycin (ZITHROMAX) 250 MG tablet, Take 250-500 mg by mouth daily. 500 mg the 1st day and 250 mg daily for 4 days until gone Started 03-16-16 for URI, Disp: , Rfl:  .  febuxostat (ULORIC) 40 MG tablet, Take 40 mg by mouth daily. , Disp: , Rfl:  .  gabapentin (NEURONTIN) 300 MG capsule, Take 300 mg by mouth at bedtime., Disp: , Rfl:  .  latanoprost (XALATAN) 0.005 % ophthalmic solution, Place 1 drop into both eyes at bedtime. Reported on 10/21/2015, Disp: , Rfl:  .  levothyroxine (SYNTHROID, LEVOTHROID) 50 MCG tablet, Take 50-100 mcg by mouth daily. Take 50 mcg daily, except 100 mcg by mouth on Sunday, Disp: , Rfl:  .  pravastatin (PRAVACHOL) 20 MG tablet, Take 20 mg by mouth daily., Disp: , Rfl:  .  TRULICITY A999333 0000000 SOPN, Inject 0.5 mLs into the skin every 7 (seven) days. Wednesdays, Disp: , Rfl: 6   Allergies  Allergen Reactions  . Contrast Media [Iodinated Diagnostic Agents] Shortness Of Breath and Other (See Comments)    bp elevated  . Oxycodone Nausea And Vomiting  . Adhesive [Tape] Other (See Comments)    Pulls skin off  . Allopurinol Diarrhea  . Codeine Nausea And Vomiting  . Colchicine Diarrhea  . Esomeprazole Magnesium Hives  . Hydrocodone Nausea And Vomiting  . Meperidine Hcl Nausea And Vomiting  . Morphine And Related Other (See Comments)    Severe headache  chills  . Pentazocine Lactate Nausea And Vomiting    Social History  Substance Use Topics  . Smoking status:  Former Research scientist (life sciences)  . Smokeless tobacco: Not on file     Comment: quit smoking 39yrs ago  . Alcohol use No    Family History  Problem Relation Age of Onset  . Diabetes Mother   . Heart attack Father   . Hypertension    . Arthritis       Review of Systems  Constitutional: Negative.   HENT: Negative.   Eyes: Negative.   Respiratory: Negative.   Cardiovascular: Negative.   Gastrointestinal: Negative.   Genitourinary: Negative.   Musculoskeletal: Positive for back pain and joint pain.  Skin: Negative.   Neurological: Negative.   Endo/Heme/Allergies: Negative.   Psychiatric/Behavioral: Negative.     Objective:  Physical Exam  Constitutional: She is oriented to person, place, and time. She appears well-developed and well-nourished.  HENT:  Head: Normocephalic and atraumatic.  Mouth/Throat: Oropharynx is clear and moist.  Eyes: Conjunctivae and EOM are normal. Pupils are equal, round, and reactive to light.  Neck: Neck supple.  Cardiovascular: Normal rate and regular rhythm.   Respiratory: Effort normal and breath sounds normal.  GI: Soft. Bowel sounds are normal.  Genitourinary:  Genitourinary Comments: Not pertinent to current symptomatology therefore not examined.  Musculoskeletal:  Examination of her right knee reveals pain medially and laterally, 1+ synovitis.  Range of motion -5 to 110 degrees, knee is stable with normal patellar tracking.  Examination of her left knee reveals full range of motion without pain, swelling, weakness, or instability.  Vascular exam:  Pulses 2+ and symmetric.  Neurologic exam:  Distal motor and sensory examination is within normal limits.  Neurological: She is alert and oriented to person, place, and time.  Skin: Skin is warm and dry.  Psychiatric: She has a normal mood and affect. Her behavior is normal.    Vital signs in last 24 hours:    Labs:   Estimated body mass index is 39.36 kg/m as calculated from the following:   Height as of  11/25/15: 5\' 3"  (1.6 m).   Weight as of 11/25/15: 100.8 kg (222 lb 3.2 oz).   Imaging Review Plain radiographs demonstrate severe degenerative joint disease of the right knee(s). The overall alignment issignificant valgus. The bone quality appears to be good for age and reported activity level.  Assessment/Plan:  End stage arthritis, right knee Principal Problem:   Primary localized osteoarthritis of right knee Active Problems:   THYROID NODULE, LEFT   Iron deficiency anemia   Glaucoma   Essential hypertension   Coronary atherosclerosis   WOLFF (WOLFE)-PARKINSON-WHITE (WPW) SYNDROME   Osteoarthritis of spine   Anxiety   GERD (gastroesophageal reflux disease)   Lumbar stenosis with neurogenic claudication   Non-insulin dependent type 2 diabetes mellitus (H[facility])   CKD (chronic kidney disease), stage III   The patient history, physical examination, clinical judgment of the provider and imaging studies are consistent with end stage degenerative joint disease of the right knee(s) and total knee arthroplasty is deemed medically necessary. The treatment options including medical management, injection therapy arthroscopy and arthroplasty were discussed at length. The risks and benefits of total knee arthroplasty were presented and reviewed. The risks due to aseptic loosening, infection, stiffness, patella tracking problems, thromboembolic complications and other imponderables were discussed. The patient acknowledged the explanation, agreed to proceed with the plan and consent was signed. Patient is being admitted for inpatient treatment for surgery, pain control, PT, OT, prophylactic antibiotics, VTE prophylaxis, progressive ambulation and ADL's and discharge planning. The patient is planning to be discharged to skilled nursing facility [facility]

## 2016-03-21 NOTE — Pre-Procedure Instructions (Signed)
[name]  03/21/2016      [place], [place] - [address] [facility]. [address] [place] [address] Phone: [phone] Fax: [phone]    Your procedure is scheduled on Monday, December 4th, 2017.  Report to [facility] Admitting at 5:30 A.M.   Call this number if you have problems the morning of surgery:  [phone]   Remember:  Do not eat food or drink liquids after midnight.   Take these medicines the morning of surgery with A SIP OF WATER: Alprazolam (Xanax) if needed, Atenolol (Tenormin), Levothyroxine (Synthroid).   Stop taking: Aspirin, NSAIDS, Aleve, Naproxen, Ibuprofen, Advil, Motrin, BC's, Goody's, Fish oil, all herbal medications, and all vitamins.     WHAT DO I DO ABOUT MY DIABETES MEDICATION?  Marland Kitchen The day of surgery, do not take other diabetes injectables, including Byetta (exenatide), Bydureon (exenatide ER), Victoza (liraglutide), or Trulicity (dulaglutide).  How to Manage Your Diabetes Before and After Surgery  Why is it important to control my blood sugar before and after surgery? . Improving blood sugar levels before and after surgery helps healing and can limit problems. . A way of improving blood sugar control is eating a healthy diet by: o  Eating less sugar and carbohydrates o  Increasing activity/exercise o  Talking with your doctor about reaching your blood sugar goals . High blood sugars (greater than 180 mg/dL) can raise your risk of infections and slow your recovery, so you will need to focus on controlling your diabetes during the weeks before surgery. . Make sure that the doctor who takes care of your diabetes knows about your planned surgery including the date and location.  How do I manage my blood sugar before surgery? . Check your blood sugar at least 4 times a day, starting 2 days before surgery, to make sure that the level is not too high or low. o Check your blood sugar  the morning of your surgery when you wake up and every 2 hours until you get to the Short Stay unit. . If your blood sugar is less than 70 mg/dL, you will need to treat for low blood sugar: o Do not take insulin. o Treat a low blood sugar (less than 70 mg/dL) with  cup of clear juice (cranberry or apple), 4 glucose tablets, OR glucose gel. o Recheck blood sugar in 15 minutes after treatment (to make sure it is greater than 70 mg/dL). If your blood sugar is not greater than 70 mg/dL on recheck, call [phone] for further instructions. . Report your blood sugar to the short stay nurse when you get to Short Stay.  . If you are admitted to the hospital after surgery: o Your blood sugar will be checked by the staff and you will probably be given insulin after surgery (instead of oral diabetes medicines) to make sure you have good blood sugar levels. o The goal for blood sugar control after surgery is 80-180 mg/dL.     Do not wear jewelry, make-up or nail polish.  Do not wear lotions, powders, or perfumes, or deoderant.  Do not shave 48 hours prior to surgery.  Do not bring valuables to the hospital.  [facility] is not responsible for any belongings or valuables.  Contacts, dentures or bridgework may not be worn into surgery.  Leave your suitcase in the car.  After surgery it may be brought to your room.  For patients admitted to the  hospital, discharge time will be determined by your treatment team.  Patients discharged the day of surgery will not be allowed to drive home.   Special instructions:  Preparing for Surgery.   [place]- Preparing For Surgery  Before surgery, you can play an important role. Because skin is not sterile, your skin needs to be as free of germs as possible. You can reduce the number of germs on your skin by washing with CHG (chlorahexidine gluconate) Soap before surgery.  CHG is an antiseptic cleaner which kills germs and bonds with the skin to continue  killing germs even after washing.  Please do not use if you have an allergy to CHG or antibacterial soaps. If your skin becomes reddened/irritated stop using the CHG.  Do not shave (including legs and underarms) for at least 48 hours prior to first CHG shower. It is OK to shave your face.  Please follow these instructions carefully.   1. Shower the NIGHT BEFORE SURGERY and the MORNING OF SURGERY with CHG.   2. If you chose to wash your hair, wash your hair first as usual with your normal shampoo.  3. After you shampoo, rinse your hair and body thoroughly to remove the shampoo.  4. Use CHG as you would any other liquid soap. You can apply CHG directly to the skin and wash gently with a scrungie or a clean washcloth.   5. Apply the CHG Soap to your body ONLY FROM THE NECK DOWN.  Do not use on open wounds or open sores. Avoid contact with your eyes, ears, mouth and genitals (private parts). Wash genitals (private parts) with your normal soap.  6. Wash thoroughly, paying special attention to the area where your surgery will be performed.  7. Thoroughly rinse your body with warm water from the neck down.  8. DO NOT shower/wash with your normal soap after using and rinsing off the CHG Soap.  9. Pat yourself dry with a CLEAN TOWEL.   10. Wear CLEAN PAJAMAS   11. Place CLEAN SHEETS on your bed the night of your first shower and DO NOT SLEEP WITH PETS.  Day of Surgery: Do not apply any deodorants/lotions. Please wear clean clothes to the hospital/surgery center.     Please read over the following fact sheets that you were given. MRSA Information     o

## 2016-03-23 ENCOUNTER — Encounter (HOSPITAL_COMMUNITY): Payer: Self-pay

## 2016-03-23 ENCOUNTER — Ambulatory Visit (HOSPITAL_COMMUNITY)
Admission: RE | Admit: 2016-03-23 | Discharge: 2016-03-23 | Disposition: A | Discharge status: Home / Self Care | Payer: Medicare Other | Source: Ambulatory Visit | Attending: Orthopedic Surgery | Admitting: Orthopedic Surgery

## 2016-03-23 DIAGNOSIS — Z01812 Encounter for preprocedural laboratory examination: Secondary | ICD-10-CM | POA: Insufficient documentation

## 2016-03-23 DIAGNOSIS — M1711 Unilateral primary osteoarthritis, right knee: Secondary | ICD-10-CM | POA: Insufficient documentation

## 2016-03-23 HISTORY — DX: Syncope and collapse: R55

## 2016-03-23 HISTORY — DX: Presence of spectacles and contact lenses: Z97.3

## 2016-03-23 LAB — GLUCOSE, CAPILLARY: Glucose-Capillary: 124 mg/dL — ABNORMAL HIGH (ref 65–99)

## 2016-03-23 LAB — COMPREHENSIVE METABOLIC PANEL
ALT: 14 U/L (ref 14–54)
ANION GAP: 9 (ref 5–15)
AST: 17 U/L (ref 15–41)
Albumin: 3.7 g/dL (ref 3.5–5.0)
Alkaline Phosphatase: 82 U/L (ref 38–126)
BUN: 17 mg/dL (ref 6–20)
CHLORIDE: 108 mmol/L (ref 101–111)
CO2: 24 mmol/L (ref 22–32)
CREATININE: 1.21 mg/dL — AB (ref 0.44–1.00)
Calcium: 9.5 mg/dL (ref 8.9–10.3)
GFR, EST AFRICAN AMERICAN: 52 mL/min — AB (ref >60)
GFR, EST NON AFRICAN AMERICAN: 45 mL/min — AB (ref >60)
Glucose, Bld: 99 mg/dL (ref 65–99)
POTASSIUM: 3.9 mmol/L (ref 3.5–5.1)
SODIUM: 141 mmol/L (ref 135–145)
Total Bilirubin: 0.3 mg/dL (ref 0.3–1.2)
Total Protein: 7.4 g/dL (ref 6.5–8.1)

## 2016-03-23 LAB — CBC WITH DIFFERENTIAL/PLATELET
Basophils Absolute: 0 10*3/uL (ref 0.0–0.1)
Basophils Relative: 0 %
EOS ABS: 0.1 10*3/uL (ref 0.0–0.7)
EOS PCT: 2 %
HCT: 34.4 % — ABNORMAL LOW (ref 36.0–46.0)
Hemoglobin: 10.9 g/dL — ABNORMAL LOW (ref 12.0–15.0)
LYMPHS ABS: 1.5 10*3/uL (ref 0.7–4.0)
LYMPHS PCT: 32 %
MCH: 26.9 pg (ref 26.0–34.0)
MCHC: 31.7 g/dL (ref 30.0–36.0)
MCV: 84.9 fL (ref 78.0–100.0)
MONO ABS: 0.4 10*3/uL (ref 0.1–1.0)
Monocytes Relative: 9 %
NEUTROS ABS: 2.6 10*3/uL (ref 1.7–7.7)
NEUTROS PCT: 57 %
PLATELETS: 265 10*3/uL (ref 150–400)
RBC: 4.05 MIL/uL (ref 3.87–5.11)
RDW: 13.3 % (ref 11.5–15.5)
WBC: 4.7 10*3/uL (ref 4.0–10.5)

## 2016-03-23 LAB — PROTIME-INR
INR: 1.02
PROTHROMBIN TIME: 13.4 s (ref 11.4–15.2)

## 2016-03-23 LAB — SURGICAL PCR SCREEN
MRSA, PCR: NEGATIVE
STAPHYLOCOCCUS AUREUS: NEGATIVE

## 2016-03-23 LAB — APTT: APTT: 29 s (ref 24–36)

## 2016-03-23 NOTE — Progress Notes (Signed)
PCP - Dr. Leanna Battles Cardiologist - Dr. Tamala Julian Nephrologist - Dr. Posey Pronto  EKG - 10/21/15 CXR - 10/21/15  Echo- 11/2015 Stress test - 2013 Cardiac Cath - denies  Patient denies chest pain and shortness of breath at PAT appointment  Patient informed nurse that she was recently taking Zithromax for bronchitis but her last dose was 03/20/16.  Patient denies fever and cough.

## 2016-03-24 LAB — URINE CULTURE: Culture: NO GROWTH

## 2016-03-24 LAB — HEMOGLOBIN A1C
HEMOGLOBIN A1C: 5.7 % — AB (ref 4.8–5.6)
MEAN PLASMA GLUCOSE: 117 mg/dL

## 2016-03-26 NOTE — Progress Notes (Signed)
Anesthesia Chart Review:  Pt is a 70 year old female scheduled for R total knee arthroplasty on 04/02/2016 with Elsie Saas, MD.   - Cardiologist is Daneen Schick, MD who has cleared pt for surgery.  - PCP is Leanna Battles, MD  PMH includes:  Wolff-Parkinson-White, HTN, DM, CKD (stage 3), anemia, hypothyroidism, post-op N/V, glaucoma.  Former smoker. BMI 41. S/p lumbar fusion 08/16/14.   Medications include: atenolol, zithromax (finished course for bronchitis 03/20/16), levothyroxine, pravastatin, trulicity.   Preoperative labs reviewed.   - HgbA1c 5.7, glucose 99.  - Cr 1.21, BUN 17.   CXR 10/21/15: No acute cardiopulmonary process seen.  EKG 10/21/15: sinus rhythm  Cardiac event monitor 12/06/15: No arrhythmias. No correlation of symptoms with rhythm disturbance. Occasional morphologic appearance of WPW conduction.  Echo 12/06/15:  - Left ventricle: The cavity size was normal. Wall thickness was normal. Systolic function was normal. The estimated ejection fraction was in the range of 55% to 60%. Wall motion was normal; there were no regional wall motion abnormalities. Doppler parameters are consistent with abnormal left ventricular relaxation (grade 1 diastolic dysfunction). - Atrial septum: There was an atrial septal aneurysm.  Nuclear stress test 07/24/11: Normal myocardial perfusion study with breast attenuation artifact. This was essentially a low risk scan.  If no changes, I anticipate pt can proceed with surgery as scheduled.   Willeen Cass, FNP-BC [facility]/Anesthesiology Phone: [phone] 03/26/2016 1:51 PM

## 2016-04-01 ENCOUNTER — Encounter (HOSPITAL_COMMUNITY): Payer: Self-pay | Admitting: Anesthesiology

## 2016-04-01 MED ORDER — DEXTROSE 5 % IV SOLN
3.0000 g | INTRAVENOUS | Status: AC
  Administered 2016-04-02: 3 g via INTRAVENOUS
  Filled 2016-04-01: qty 3000

## 2016-04-01 NOTE — Anesthesia Preprocedure Evaluation (Addendum)
Anesthesia Evaluation  Patient identified by MRN, date of birth, ID band Patient awake    Reviewed: Allergy & Precautions, NPO status , Patient's Chart, lab work & pertinent test results, reviewed documented beta blocker date and time   History of Anesthesia Complications (+) PONV and history of anesthetic complications  Airway Mallampati: II       Dental  (+) Teeth Intact, Dental Advisory Given   Pulmonary former smoker,    breath sounds clear to auscultation       Cardiovascular hypertension, Pt. on medications and Pt. on home beta blockers + CAD   Rhythm:Regular Rate:Normal     Neuro/Psych PSYCHIATRIC DISORDERS Anxiety negative neurological ROS     GI/Hepatic Neg liver ROS, GERD  Medicated,  Endo/Other  diabetesHypothyroidism   Renal/GU negative Renal ROS  negative genitourinary   Musculoskeletal  (+) Arthritis , Osteoarthritis,    Abdominal   Peds negative pediatric ROS (+)  Hematology negative hematology ROS (+)   Anesthesia Other Findings   Reproductive/Obstetrics negative OB ROS                            Lab Results  Component Value Date   WBC 4.7 03/23/2016   HGB 10.9 (L) 03/23/2016   HCT 34.4 (L) 03/23/2016   MCV 84.9 03/23/2016   PLT 265 03/23/2016   Lab Results  Component Value Date   INR 1.02 03/23/2016   INR 1.0 08/16/2008   EKG: NSR  Echo - Left ventricle: The cavity size was normal. Wall thickness was   normal. Systolic function was normal. The estimated ejection   fraction was in the range of 55% to 60%. Wall motion was normal;   there were no regional wall motion abnormalities. Doppler   parameters are consistent with abnormal left ventricular   relaxation (grade 1 diastolic dysfunction). - Atrial septum: There was an atrial septal aneurysm.  Anesthesia Physical Anesthesia Plan  ASA: III  Anesthesia Plan: Spinal   Post-op Pain Management:   Regional for Post-op pain   Induction: Intravenous  Airway Management Planned: Natural Airway  Additional Equipment:   Intra-op Plan:   Post-operative Plan:   Informed Consent: I have reviewed the patients History and Physical, chart, labs and discussed the procedure including the risks, benefits and alternatives for the proposed anesthesia with the patient or authorized representative who has indicated his/her understanding and acceptance.     Plan Discussed with: CRNA  Anesthesia Plan Comments: (History of L2-3 fusion)        Anesthesia Quick Evaluation

## 2016-04-02 ENCOUNTER — Inpatient Hospital Stay (HOSPITAL_COMMUNITY)
Admission: RE | Admit: 2016-04-02 | Discharge: 2016-04-05 | DRG: 470 | Disposition: A | Discharge status: Skilled Nursing Facility | Payer: Medicare Other | Source: Ambulatory Visit | Attending: Orthopedic Surgery | Admitting: Orthopedic Surgery

## 2016-04-02 ENCOUNTER — Encounter (HOSPITAL_COMMUNITY): Payer: Self-pay | Admitting: General Practice

## 2016-04-02 ENCOUNTER — Inpatient Hospital Stay (HOSPITAL_COMMUNITY): Payer: Medicare Other | Admitting: Anesthesiology

## 2016-04-02 ENCOUNTER — Encounter (HOSPITAL_COMMUNITY)
Admission: RE | Disposition: A | Discharge status: Skilled Nursing Facility | Payer: Self-pay | Source: Ambulatory Visit | Attending: Orthopedic Surgery

## 2016-04-02 ENCOUNTER — Inpatient Hospital Stay (HOSPITAL_COMMUNITY): Payer: Medicare Other | Admitting: Emergency Medicine

## 2016-04-02 DIAGNOSIS — F419 Anxiety disorder, unspecified: Secondary | ICD-10-CM | POA: Diagnosis present

## 2016-04-02 DIAGNOSIS — IMO0001 Reserved for inherently not codable concepts without codable children: Secondary | ICD-10-CM

## 2016-04-02 DIAGNOSIS — M549 Dorsalgia, unspecified: Secondary | ICD-10-CM | POA: Diagnosis present

## 2016-04-02 DIAGNOSIS — D509 Iron deficiency anemia, unspecified: Secondary | ICD-10-CM | POA: Diagnosis present

## 2016-04-02 DIAGNOSIS — G20 Parkinson's disease: Secondary | ICD-10-CM | POA: Diagnosis present

## 2016-04-02 DIAGNOSIS — Z86010 Personal history of colonic polyps: Secondary | ICD-10-CM

## 2016-04-02 DIAGNOSIS — H409 Unspecified glaucoma: Secondary | ICD-10-CM | POA: Diagnosis present

## 2016-04-02 DIAGNOSIS — Z87891 Personal history of nicotine dependence: Secondary | ICD-10-CM

## 2016-04-02 DIAGNOSIS — M1711 Unilateral primary osteoarthritis, right knee: Secondary | ICD-10-CM | POA: Diagnosis present

## 2016-04-02 DIAGNOSIS — Z79899 Other long term (current) drug therapy: Secondary | ICD-10-CM

## 2016-04-02 DIAGNOSIS — D62 Acute posthemorrhagic anemia: Secondary | ICD-10-CM | POA: Diagnosis not present

## 2016-04-02 DIAGNOSIS — N393 Stress incontinence (female) (male): Secondary | ICD-10-CM | POA: Diagnosis present

## 2016-04-02 DIAGNOSIS — E039 Hypothyroidism, unspecified: Secondary | ICD-10-CM | POA: Diagnosis present

## 2016-04-02 DIAGNOSIS — Z6841 Body Mass Index (BMI) 40.0 and over, adult: Secondary | ICD-10-CM

## 2016-04-02 DIAGNOSIS — N183 Chronic kidney disease, stage 3 unspecified: Secondary | ICD-10-CM | POA: Diagnosis present

## 2016-04-02 DIAGNOSIS — E041 Nontoxic single thyroid nodule: Secondary | ICD-10-CM | POA: Diagnosis present

## 2016-04-02 DIAGNOSIS — I129 Hypertensive chronic kidney disease with stage 1 through stage 4 chronic kidney disease, or unspecified chronic kidney disease: Secondary | ICD-10-CM | POA: Diagnosis present

## 2016-04-02 DIAGNOSIS — I2510 Atherosclerotic heart disease of native coronary artery without angina pectoris: Secondary | ICD-10-CM | POA: Diagnosis present

## 2016-04-02 DIAGNOSIS — Z9049 Acquired absence of other specified parts of digestive tract: Secondary | ICD-10-CM | POA: Diagnosis not present

## 2016-04-02 DIAGNOSIS — M25561 Pain in right knee: Secondary | ICD-10-CM | POA: Diagnosis present

## 2016-04-02 DIAGNOSIS — M109 Gout, unspecified: Secondary | ICD-10-CM | POA: Diagnosis present

## 2016-04-02 DIAGNOSIS — M48062 Spinal stenosis, lumbar region with neurogenic claudication: Secondary | ICD-10-CM | POA: Diagnosis present

## 2016-04-02 DIAGNOSIS — E1122 Type 2 diabetes mellitus with diabetic chronic kidney disease: Secondary | ICD-10-CM | POA: Diagnosis present

## 2016-04-02 DIAGNOSIS — E119 Type 2 diabetes mellitus without complications: Secondary | ICD-10-CM

## 2016-04-02 DIAGNOSIS — K219 Gastro-esophageal reflux disease without esophagitis: Secondary | ICD-10-CM | POA: Diagnosis present

## 2016-04-02 DIAGNOSIS — M479 Spondylosis, unspecified: Secondary | ICD-10-CM | POA: Diagnosis present

## 2016-04-02 DIAGNOSIS — G8929 Other chronic pain: Secondary | ICD-10-CM | POA: Diagnosis present

## 2016-04-02 DIAGNOSIS — Z8701 Personal history of pneumonia (recurrent): Secondary | ICD-10-CM

## 2016-04-02 DIAGNOSIS — I456 Pre-excitation syndrome: Secondary | ICD-10-CM | POA: Diagnosis present

## 2016-04-02 DIAGNOSIS — Z888 Allergy status to other drugs, medicaments and biological substances status: Secondary | ICD-10-CM

## 2016-04-02 DIAGNOSIS — I10 Essential (primary) hypertension: Secondary | ICD-10-CM | POA: Diagnosis present

## 2016-04-02 DIAGNOSIS — Z833 Family history of diabetes mellitus: Secondary | ICD-10-CM

## 2016-04-02 DIAGNOSIS — Z8249 Family history of ischemic heart disease and other diseases of the circulatory system: Secondary | ICD-10-CM

## 2016-04-02 DIAGNOSIS — Z885 Allergy status to narcotic agent status: Secondary | ICD-10-CM

## 2016-04-02 DIAGNOSIS — Z91041 Radiographic dye allergy status: Secondary | ICD-10-CM

## 2016-04-02 HISTORY — PX: TOTAL KNEE ARTHROPLASTY: SHX125

## 2016-04-02 HISTORY — DX: Reserved for inherently not codable concepts without codable children: IMO0001

## 2016-04-02 HISTORY — DX: Unilateral primary osteoarthritis, right knee: M17.11

## 2016-04-02 HISTORY — DX: Acute posthemorrhagic anemia: D62

## 2016-04-02 LAB — GLUCOSE, CAPILLARY
GLUCOSE-CAPILLARY: 100 mg/dL — AB (ref 65–99)
Glucose-Capillary: 113 mg/dL — ABNORMAL HIGH (ref 65–99)

## 2016-04-02 SURGERY — ARTHROPLASTY, KNEE, TOTAL
Anesthesia: Spinal | Site: Knee | Laterality: Right

## 2016-04-02 MED ORDER — DEXAMETHASONE SODIUM PHOSPHATE 10 MG/ML IJ SOLN
INTRAMUSCULAR | Status: AC
  Filled 2016-04-02: qty 1

## 2016-04-02 MED ORDER — DEXAMETHASONE SODIUM PHOSPHATE 10 MG/ML IJ SOLN
INTRAMUSCULAR | Status: DC | PRN
  Administered 2016-04-02: 10 mg via INTRAVENOUS

## 2016-04-02 MED ORDER — HYDROMORPHONE HCL 1 MG/ML IJ SOLN: 0.2500 mg | INTRAMUSCULAR | Status: DC | PRN

## 2016-04-02 MED ORDER — LEVOTHYROXINE SODIUM 50 MCG PO TABS
50.0000 ug | ORAL_TABLET | ORAL | Status: DC
  Administered 2016-04-03 – 2016-04-05 (×3): 50 ug via ORAL
  Filled 2016-04-02 (×3): qty 1

## 2016-04-02 MED ORDER — MEPERIDINE HCL 25 MG/ML IJ SOLN: 6.2500 mg | INTRAMUSCULAR | Status: DC | PRN

## 2016-04-02 MED ORDER — CHLORHEXIDINE GLUCONATE 4 % EX LIQD: 60.0000 mL | Freq: Once | CUTANEOUS | Status: DC

## 2016-04-02 MED ORDER — DEXAMETHASONE SODIUM PHOSPHATE 10 MG/ML IJ SOLN
10.0000 mg | Freq: Three times a day (TID) | INTRAMUSCULAR | Status: AC
  Administered 2016-04-02 – 2016-04-03 (×4): 10 mg via INTRAVENOUS
  Filled 2016-04-02 (×4): qty 1

## 2016-04-02 MED ORDER — ACETAMINOPHEN 500 MG PO TABS
1000.0000 mg | ORAL_TABLET | Freq: Four times a day (QID) | ORAL | Status: AC
  Administered 2016-04-02 – 2016-04-03 (×4): 1000 mg via ORAL
  Filled 2016-04-02 (×4): qty 2

## 2016-04-02 MED ORDER — PROMETHAZINE HCL 25 MG/ML IJ SOLN
INTRAMUSCULAR | Status: AC
  Filled 2016-04-02: qty 1

## 2016-04-02 MED ORDER — EPHEDRINE SULFATE 50 MG/ML IJ SOLN
INTRAMUSCULAR | Status: DC | PRN
  Administered 2016-04-02: 5 mg via INTRAVENOUS

## 2016-04-02 MED ORDER — FENTANYL CITRATE (PF) 100 MCG/2ML IJ SOLN
INTRAMUSCULAR | Status: DC | PRN
  Administered 2016-04-02 (×2): 50 ug via INTRAVENOUS

## 2016-04-02 MED ORDER — LACTATED RINGERS IV SOLN: INTRAVENOUS | Status: DC

## 2016-04-02 MED ORDER — LEVOTHYROXINE SODIUM 50 MCG PO TABS
50.0000 ug | ORAL_TABLET | Freq: Once | ORAL | Status: AC
  Administered 2016-04-02: 50 ug via ORAL
  Filled 2016-04-02: qty 1

## 2016-04-02 MED ORDER — LEVOTHYROXINE SODIUM 100 MCG PO TABS: 100.0000 ug | ORAL_TABLET | ORAL | Status: DC

## 2016-04-02 MED ORDER — PRAVASTATIN SODIUM 20 MG PO TABS
20.0000 mg | ORAL_TABLET | Freq: Every day | ORAL | Status: DC
  Administered 2016-04-02 – 2016-04-04 (×3): 20 mg via ORAL
  Filled 2016-04-02 (×3): qty 1

## 2016-04-02 MED ORDER — PROPOFOL 500 MG/50ML IV EMUL
INTRAVENOUS | Status: DC | PRN
  Administered 2016-04-02: 50 ug/kg/min via INTRAVENOUS

## 2016-04-02 MED ORDER — LATANOPROST 0.005 % OP SOLN
1.0000 [drp] | Freq: Every day | OPHTHALMIC | Status: DC
  Administered 2016-04-02 – 2016-04-04 (×3): 1 [drp] via OPHTHALMIC
  Filled 2016-04-02: qty 2.5

## 2016-04-02 MED ORDER — DULAGLUTIDE 0.75 MG/0.5ML ~~LOC~~ SOAJ: 0.5000 mL | SUBCUTANEOUS | Status: DC

## 2016-04-02 MED ORDER — ATENOLOL 50 MG PO TABS
50.0000 mg | ORAL_TABLET | Freq: Once | ORAL | Status: AC
  Administered 2016-04-02: 50 mg via ORAL
  Filled 2016-04-02: qty 1

## 2016-04-02 MED ORDER — APIXABAN 2.5 MG PO TABS
2.5000 mg | ORAL_TABLET | Freq: Two times a day (BID) | ORAL | Status: DC
  Administered 2016-04-03 – 2016-04-05 (×5): 2.5 mg via ORAL
  Filled 2016-04-02 (×5): qty 1

## 2016-04-02 MED ORDER — MIDAZOLAM HCL 5 MG/5ML IJ SOLN
INTRAMUSCULAR | Status: DC | PRN
  Administered 2016-04-02: 2 mg via INTRAVENOUS

## 2016-04-02 MED ORDER — METOCLOPRAMIDE HCL 5 MG PO TABS: 5.0000 mg | ORAL_TABLET | Freq: Three times a day (TID) | ORAL | Status: DC | PRN

## 2016-04-02 MED ORDER — LACTATED RINGERS IV SOLN
INTRAVENOUS | Status: DC
  Administered 2016-04-02 (×2): via INTRAVENOUS

## 2016-04-02 MED ORDER — ONDANSETRON HCL 4 MG/2ML IJ SOLN
INTRAMUSCULAR | Status: DC | PRN
  Administered 2016-04-02: 4 mg via INTRAVENOUS

## 2016-04-02 MED ORDER — FEBUXOSTAT 40 MG PO TABS
40.0000 mg | ORAL_TABLET | Freq: Every day | ORAL | Status: DC
  Administered 2016-04-02 – 2016-04-05 (×4): 40 mg via ORAL
  Filled 2016-04-02 (×4): qty 1

## 2016-04-02 MED ORDER — GABAPENTIN 300 MG PO CAPS
300.0000 mg | ORAL_CAPSULE | Freq: Every day | ORAL | Status: DC
  Administered 2016-04-02 – 2016-04-04 (×3): 300 mg via ORAL
  Filled 2016-04-02 (×3): qty 1

## 2016-04-02 MED ORDER — ONDANSETRON HCL 4 MG/2ML IJ SOLN: 4.0000 mg | Freq: Four times a day (QID) | INTRAMUSCULAR | Status: DC | PRN

## 2016-04-02 MED ORDER — FENTANYL CITRATE (PF) 100 MCG/2ML IJ SOLN
INTRAMUSCULAR | Status: AC
  Filled 2016-04-02: qty 2

## 2016-04-02 MED ORDER — BUPIVACAINE IN DEXTROSE 0.75-8.25 % IT SOLN
INTRATHECAL | Status: DC | PRN
  Administered 2016-04-02: 2 mL via INTRATHECAL

## 2016-04-02 MED ORDER — BUPIVACAINE-EPINEPHRINE 0.25% -1:200000 IJ SOLN
INTRAMUSCULAR | Status: DC | PRN
  Administered 2016-04-02: 30 mL

## 2016-04-02 MED ORDER — MORPHINE SULFATE (PF) 2 MG/ML IV SOLN
2.0000 mg | INTRAVENOUS | Status: DC | PRN
  Administered 2016-04-02: 2 mg via INTRAVENOUS
  Filled 2016-04-02: qty 1

## 2016-04-02 MED ORDER — PROPOFOL 10 MG/ML IV BOLUS
INTRAVENOUS | Status: AC
  Filled 2016-04-02: qty 20

## 2016-04-02 MED ORDER — POVIDONE-IODINE 7.5 % EX SOLN
Freq: Once | CUTANEOUS | Status: DC
  Filled 2016-04-02: qty 118

## 2016-04-02 MED ORDER — ALPRAZOLAM 0.5 MG PO TABS
0.5000 mg | ORAL_TABLET | Freq: Three times a day (TID) | ORAL | Status: DC | PRN
  Administered 2016-04-02 – 2016-04-04 (×3): 0.5 mg via ORAL
  Filled 2016-04-02 (×3): qty 1

## 2016-04-02 MED ORDER — MENTHOL 3 MG MT LOZG
1.0000 | LOZENGE | OROMUCOSAL | Status: DC | PRN
  Administered 2016-04-02: 3 mg via ORAL
  Filled 2016-04-02: qty 9

## 2016-04-02 MED ORDER — DOCUSATE SODIUM 100 MG PO CAPS
100.0000 mg | ORAL_CAPSULE | Freq: Two times a day (BID) | ORAL | Status: DC
  Administered 2016-04-02 – 2016-04-04 (×4): 100 mg via ORAL
  Filled 2016-04-02 (×6): qty 1

## 2016-04-02 MED ORDER — BUPIVACAINE-EPINEPHRINE (PF) 0.25% -1:200000 IJ SOLN
INTRAMUSCULAR | Status: AC
  Filled 2016-04-02: qty 30

## 2016-04-02 MED ORDER — POTASSIUM CHLORIDE IN NACL 20-0.9 MEQ/L-% IV SOLN
INTRAVENOUS | Status: DC
  Administered 2016-04-02 – 2016-04-03 (×2): via INTRAVENOUS
  Filled 2016-04-02 (×2): qty 1000

## 2016-04-02 MED ORDER — ACETAMINOPHEN 650 MG RE SUPP: 650.0000 mg | Freq: Four times a day (QID) | RECTAL | Status: DC | PRN

## 2016-04-02 MED ORDER — ACETAMINOPHEN 325 MG PO TABS
650.0000 mg | ORAL_TABLET | Freq: Four times a day (QID) | ORAL | Status: DC | PRN
  Administered 2016-04-03 – 2016-04-05 (×2): 650 mg via ORAL
  Filled 2016-04-02 (×2): qty 2

## 2016-04-02 MED ORDER — MIDAZOLAM HCL 2 MG/2ML IJ SOLN
INTRAMUSCULAR | Status: AC
  Filled 2016-04-02: qty 2

## 2016-04-02 MED ORDER — DIPHENHYDRAMINE HCL 12.5 MG/5ML PO ELIX: 12.5000 mg | ORAL_SOLUTION | ORAL | Status: DC | PRN

## 2016-04-02 MED ORDER — CEFAZOLIN SODIUM-DEXTROSE 2-4 GM/100ML-% IV SOLN
2.0000 g | Freq: Four times a day (QID) | INTRAVENOUS | Status: AC
  Administered 2016-04-02 (×2): 2 g via INTRAVENOUS
  Filled 2016-04-02 (×2): qty 100

## 2016-04-02 MED ORDER — ATENOLOL 50 MG PO TABS
50.0000 mg | ORAL_TABLET | Freq: Every day | ORAL | Status: DC
Start: 2016-04-02 — End: 2016-04-05
  Administered 2016-04-03 – 2016-04-05 (×3): 50 mg via ORAL
  Filled 2016-04-02 (×3): qty 1

## 2016-04-02 MED ORDER — POLYETHYLENE GLYCOL 3350 17 G PO PACK
17.0000 g | PACK | Freq: Two times a day (BID) | ORAL | Status: DC
  Administered 2016-04-02 – 2016-04-04 (×4): 17 g via ORAL
  Filled 2016-04-02 (×6): qty 1

## 2016-04-02 MED ORDER — METOCLOPRAMIDE HCL 5 MG/ML IJ SOLN: 5.0000 mg | Freq: Three times a day (TID) | INTRAMUSCULAR | Status: DC | PRN

## 2016-04-02 MED ORDER — PHENOL 1.4 % MT LIQD: 1.0000 | OROMUCOSAL | Status: DC | PRN

## 2016-04-02 MED ORDER — SODIUM CHLORIDE 0.9 % IR SOLN
Status: DC | PRN
  Administered 2016-04-02: 1000 mL

## 2016-04-02 MED ORDER — HYDROMORPHONE HCL 2 MG PO TABS
2.0000 mg | ORAL_TABLET | ORAL | Status: DC | PRN
  Administered 2016-04-02 (×2): 4 mg via ORAL
  Administered 2016-04-03 – 2016-04-05 (×10): 2 mg via ORAL
  Filled 2016-04-02 (×2): qty 1
  Filled 2016-04-02: qty 2
  Filled 2016-04-02 (×7): qty 1
  Filled 2016-04-02: qty 2
  Filled 2016-04-02 (×2): qty 1

## 2016-04-02 MED ORDER — GLYCOPYRROLATE 0.2 MG/ML IV SOSY
PREFILLED_SYRINGE | INTRAVENOUS | Status: DC | PRN
Start: 2016-04-02 — End: 2016-04-02
  Administered 2016-04-02: .2 mg via INTRAVENOUS

## 2016-04-02 MED ORDER — ONDANSETRON HCL 4 MG PO TABS: 4.0000 mg | ORAL_TABLET | Freq: Four times a day (QID) | ORAL | Status: DC | PRN

## 2016-04-02 MED ORDER — SODIUM CHLORIDE 0.9 % IR SOLN
Status: DC | PRN
  Administered 2016-04-02: 3000 mL

## 2016-04-02 MED ORDER — GLYCOPYRROLATE 0.2 MG/ML IV SOSY
PREFILLED_SYRINGE | INTRAVENOUS | Status: AC
  Filled 2016-04-02: qty 3

## 2016-04-02 MED ORDER — ALUM & MAG HYDROXIDE-SIMETH 200-200-20 MG/5ML PO SUSP: 30.0000 mL | ORAL | Status: DC | PRN

## 2016-04-02 MED ORDER — PROMETHAZINE HCL 25 MG/ML IJ SOLN
6.2500 mg | INTRAMUSCULAR | Status: DC | PRN
  Administered 2016-04-02: 6.25 mg via INTRAVENOUS

## 2016-04-02 SURGICAL SUPPLY — 73 items
APL SKNCLS STERI-STRIP NONHPOA (GAUZE/BANDAGES/DRESSINGS) ×1
BANDAGE ESMARK 6X9 LF (GAUZE/BANDAGES/DRESSINGS) ×1 IMPLANT
BENZOIN TINCTURE PRP APPL 2/3 (GAUZE/BANDAGES/DRESSINGS) ×3 IMPLANT
BLADE SAGITTAL 25.0X1.19X90 (BLADE) ×2 IMPLANT
BLADE SAGITTAL 25.0X1.19X90MM (BLADE) ×1
BLADE SAW SGTL 13X75X1.27 (BLADE) ×3 IMPLANT
BLADE SURG 10 STRL SS (BLADE) ×6 IMPLANT
BNDG CMPR 9X6 STRL LF SNTH (GAUZE/BANDAGES/DRESSINGS) ×1
BNDG CMPR MED 15X6 ELC VLCR LF (GAUZE/BANDAGES/DRESSINGS) ×1
BNDG ELASTIC 6X15 VLCR STRL LF (GAUZE/BANDAGES/DRESSINGS) ×3 IMPLANT
BNDG ESMARK 6X9 LF (GAUZE/BANDAGES/DRESSINGS) ×3
BOWL SMART MIX CTS (DISPOSABLE) ×3 IMPLANT
CAPT KNEE TOTAL 3 ATTUNE ×3 IMPLANT
CEMENT HV SMART SET (Cement) ×6 IMPLANT
CLOSURE WOUND 1/2 X4 (GAUZE/BANDAGES/DRESSINGS) ×1
COVER SURGICAL LIGHT HANDLE (MISCELLANEOUS) ×3 IMPLANT
CUFF TOURNIQUET SINGLE 34IN LL (TOURNIQUET CUFF) IMPLANT
CUFF TOURNIQUET SINGLE 44IN (TOURNIQUET CUFF) ×3 IMPLANT
DECANTER SPIKE VIAL GLASS SM (MISCELLANEOUS) ×3 IMPLANT
DRAPE EXTREMITY T 121X128X90 (DRAPE) ×3 IMPLANT
DRAPE HALF SHEET 40X57 (DRAPES) ×3 IMPLANT
DRAPE INCISE IOBAN 66X45 STRL (DRAPES) ×6 IMPLANT
DRAPE PROXIMA HALF (DRAPES) ×3 IMPLANT
DRAPE U-SHAPE 47X51 STRL (DRAPES) ×3 IMPLANT
DRSG AQUACEL AG ADV 3.5X14 (GAUZE/BANDAGES/DRESSINGS) ×3 IMPLANT
DURAPREP 26ML APPLICATOR (WOUND CARE) ×6 IMPLANT
ELECT CAUTERY BLADE 6.4 (BLADE) ×3 IMPLANT
ELECT REM PT RETURN 9FT ADLT (ELECTROSURGICAL) ×3
ELECTRODE REM PT RTRN 9FT ADLT (ELECTROSURGICAL) ×1 IMPLANT
FACESHIELD WRAPAROUND (MASK) ×3 IMPLANT
GLOVE BIO SURGEON STRL SZ7 (GLOVE) ×9 IMPLANT
GLOVE BIOGEL PI IND STRL 6.5 (GLOVE) ×3 IMPLANT
GLOVE BIOGEL PI IND STRL 7.0 (GLOVE) ×1 IMPLANT
GLOVE BIOGEL PI IND STRL 7.5 (GLOVE) ×1 IMPLANT
GLOVE BIOGEL PI INDICATOR 6.5 (GLOVE) ×6
GLOVE BIOGEL PI INDICATOR 7.0 (GLOVE) ×2
GLOVE BIOGEL PI INDICATOR 7.5 (GLOVE) ×2
GLOVE SS BIOGEL STRL SZ 7.5 (GLOVE) ×2 IMPLANT
GLOVE SUPERSENSE BIOGEL SZ 7.5 (GLOVE) ×4
GOWN STRL REUS W/ TWL LRG LVL3 (GOWN DISPOSABLE) ×1 IMPLANT
GOWN STRL REUS W/ TWL XL LVL3 (GOWN DISPOSABLE) ×3 IMPLANT
GOWN STRL REUS W/TWL LRG LVL3 (GOWN DISPOSABLE) ×3
GOWN STRL REUS W/TWL XL LVL3 (GOWN DISPOSABLE) ×9
HANDPIECE INTERPULSE COAX TIP (DISPOSABLE) ×3
HOOD PEEL AWAY FACE SHEILD DIS (HOOD) ×9 IMPLANT
IMMOBILIZER KNEE 22 UNIV (SOFTGOODS) ×3 IMPLANT
KIT BASIN OR (CUSTOM PROCEDURE TRAY) ×3 IMPLANT
KIT ROOM TURNOVER OR (KITS) ×3 IMPLANT
MANIFOLD NEPTUNE II (INSTRUMENTS) ×3 IMPLANT
MARKER SKIN DUAL TIP RULER LAB (MISCELLANEOUS) ×3 IMPLANT
NEEDLE 18GX1X1/2 (RX/OR ONLY) (NEEDLE) ×3 IMPLANT
NS IRRIG 1000ML POUR BTL (IV SOLUTION) ×3 IMPLANT
PACK TOTAL JOINT (CUSTOM PROCEDURE TRAY) ×3 IMPLANT
PAD ARMBOARD 7.5X6 YLW CONV (MISCELLANEOUS) ×6 IMPLANT
SET HNDPC FAN SPRY TIP SCT (DISPOSABLE) ×1 IMPLANT
STRIP CLOSURE SKIN 1/2X4 (GAUZE/BANDAGES/DRESSINGS) ×2 IMPLANT
SUCTION FRAZIER HANDLE 10FR (MISCELLANEOUS) ×2
SUCTION TUBE FRAZIER 10FR DISP (MISCELLANEOUS) ×1 IMPLANT
SUT MNCRL AB 3-0 PS2 18 (SUTURE) ×3 IMPLANT
SUT VIC AB 0 CT1 27 (SUTURE) ×9
SUT VIC AB 0 CT1 27XBRD ANBCTR (SUTURE) ×3 IMPLANT
SUT VIC AB 1 CT1 27 (SUTURE) ×6
SUT VIC AB 1 CT1 27XBRD ANBCTR (SUTURE) ×2 IMPLANT
SUT VIC AB 2-0 CT1 27 (SUTURE) ×6
SUT VIC AB 2-0 CT1 TAPERPNT 27 (SUTURE) ×2 IMPLANT
SYR 30ML LL (SYRINGE) ×3 IMPLANT
TOWEL OR 17X24 6PK STRL BLUE (TOWEL DISPOSABLE) ×3 IMPLANT
TOWEL OR 17X26 10 PK STRL BLUE (TOWEL DISPOSABLE) ×3 IMPLANT
TRAY CATH 16FR W/PLASTIC CATH (SET/KITS/TRAYS/PACK) IMPLANT
TRAY FOLEY CATH 16FR SILVER (SET/KITS/TRAYS/PACK) ×3 IMPLANT
TUBE CONNECTING 12'X1/4 (SUCTIONS) ×1
TUBE CONNECTING 12X1/4 (SUCTIONS) ×2 IMPLANT
YANKAUER SUCT BULB TIP NO VENT (SUCTIONS) ×3 IMPLANT

## 2016-04-02 NOTE — Progress Notes (Signed)
Orthopedic Tech Progress Note Patient Details:  [name] 1946-03-26 [id number]  Patient ID: [name], female   DOB: Oct 09, 1945, 70 y.o.   MRN: [id number]   Hildred Priest 04/02/2016, 1:14 PM Viewed order from doctor's order list

## 2016-04-02 NOTE — Transfer of Care (Signed)
Immediate Anesthesia Transfer of Care Note  Patient: [name]  Procedure(s) Performed: Procedure(s): RIGHT TOTAL KNEE ARTHROPLASTY (Right)  Patient Location: PACU  Anesthesia Type:MAC  Level of Consciousness: awake, alert , oriented and patient cooperative  Airway & Oxygen Therapy: Patient Spontanous Breathing and Patient connected to nasal cannula oxygen  Post-op Assessment: Report given to RN and Post -op Vital signs reviewed and stable  Post vital signs: Reviewed and stable  Last Vitals:  Vitals:   04/02/16 0607  BP: (!) 160/90  Pulse: 68  Resp: 20  Temp: 36.7 C    Last Pain:  Vitals:   04/02/16 0607  TempSrc: Oral  PainSc:       Patients Stated Pain Goal: 3 ([id number])  Complications: No apparent anesthesia complications

## 2016-04-02 NOTE — Anesthesia Procedure Notes (Signed)
Anesthesia Regional Block:  Adductor canal block  Pre-Anesthetic Checklist: ,, timeout performed, Correct Patient, Correct Site, Correct Laterality, Correct Procedure, Correct Position, site marked, Risks and benefits discussed,  Surgical consent,  Pre-op evaluation,  At surgeon's request and post-op pain management  Laterality: Right  Prep: chloraprep       Needles:  Injection technique: Single-shot  Needle Type: Echogenic Needle     Needle Length: 9cm 9 cm Needle Gauge: 21 and 21 G    Additional Needles:  Procedures: ultrasound guided (picture in chart) Adductor canal block Narrative:  Start time: 04/02/2016 7:08 AM End time: 04/02/2016 7:12 AM Injection made incrementally with aspirations every 5 mL.  Performed by: Personally  Anesthesiologist: Suella Broad D  Additional Notes: No immediate complications noted. Pt tolerated well.

## 2016-04-02 NOTE — Evaluation (Signed)
Physical Therapy Evaluation Patient Details Name: [name] MRN: [id number] DOB: 01/26/1946 Today's Date: 04/02/2016   History of Present Illness  Patient is a 70 y/o female with hx of HTN, DM, CKD, anxiety, glaucoma, wolf-parkinson-white syndrome presents s/p Rt TKA.  Clinical Impression  Patient presents with pain and post surgical deficits s/p above surgery. Tolerated short distance ambulation with Min A for balance/safety. Reports increased pain with weight bearing and mobility. Education re: precautions, exercises and positioning. Pt independent PTA but lives alone. Would benefit from ST SNF to maximize independence and mobility prior to return home. Will follow acutely.     Follow Up Recommendations SNF    Equipment Recommendations  None recommended by PT    Recommendations for Other Services OT consult     Precautions / Restrictions Precautions Precautions: Knee Precaution Booklet Issued: No Precaution Comments: Reviewed no pillow under knee and precautions Restrictions Weight Bearing Restrictions: Yes RLE Weight Bearing: Weight bearing as tolerated      Mobility  Bed Mobility Overal bed mobility: Needs Assistance Bed Mobility: Supine to Sit     Supine to sit: Min guard;HOB elevated     General bed mobility comments: Increased time but no assist needed to get to EOB. Use of rail.  Transfers Overall transfer level: Needs assistance Equipment used: Rolling walker (2 wheeled) Transfers: Sit to/from Stand Sit to Stand: Min assist         General transfer comment: Assist to power to standing with cues for hand placement/technique. Transferred to chair post ambulation bout.  Ambulation/Gait Ambulation/Gait assistance: Min assist Ambulation Distance (Feet): 20 Feet Assistive device: Rolling walker (2 wheeled) Gait Pattern/deviations: Step-through pattern;Step-to pattern;Decreased stance time - right;Decreased step length - left;Trunk flexed;Wide base of  support Gait velocity: decreased Gait velocity interpretation: Below normal speed for age/gender General Gait Details: Slow, mostly steady gait with increased WB through BUEs. Increased pain with WB.  Stairs            Wheelchair Mobility    Modified Rankin (Stroke Patients Only)       Balance Overall balance assessment: Needs assistance Sitting-balance support: Feet supported;No upper extremity supported Sitting balance-Leahy Scale: Good     Standing balance support: During functional activity Standing balance-Leahy Scale: Poor Standing balance comment: Reliant on BUEs for support in standing.                             Pertinent Vitals/Pain Pain Assessment: 0-10 Pain Score: 6  (up to 10 post ambulation) Pain Location: right knee Pain Descriptors / Indicators: Sore;Operative site guarding Pain Intervention(s): Monitored during session;Repositioned;Patient requesting pain meds-RN notified;Limited activity within patient's tolerance    Home Living Family/patient expects to be discharged to:: Skilled nursing facility ([facility]) Living Arrangements: Alone                    Prior Function Level of Independence: Independent         Comments: Does not cook or clean.     Hand Dominance        Extremity/Trunk Assessment   Upper Extremity Assessment: Defer to OT evaluation           Lower Extremity Assessment: RLE deficits/detail RLE Deficits / Details: Able to perform LAQ and quad set without difficulty. Post op limitations and pain       Communication   Communication: No difficulties  Cognition Arousal/Alertness: Awake/alert Behavior During Therapy: WFL for tasks  assessed/performed Overall Cognitive Status: Within Functional Limits for tasks assessed                      General Comments General comments (skin integrity, edema, etc.): Daughter present during session.    Exercises Total Joint Exercises Ankle  Circles/Pumps: Both;10 reps;Supine Quad Sets: Both;10 reps;Supine Gluteal Sets: Both;10 reps;Supine   Assessment/Plan    PT Assessment Patient needs continued PT services  PT Problem List Decreased strength;Decreased mobility;Decreased range of motion;Decreased activity tolerance;Pain;Decreased balance          PT Treatment Interventions DME instruction;Therapeutic activities;Gait training;Therapeutic exercise;Balance training;Patient/family education;Functional mobility training;Stair training    PT Goals (Current goals can be found in the Care Plan section)  Acute Rehab PT Goals Patient Stated Goal: to go to rehab PT Goal Formulation: With patient Time For Goal Achievement: 04/16/16 Potential to Achieve Goals: Good    Frequency 7X/week   Barriers to discharge Decreased caregiver support      Co-evaluation               End of Session Equipment Utilized During Treatment: Gait belt Activity Tolerance: Patient tolerated treatment well Patient left: in chair;with call bell/phone within reach;with family/visitor present Nurse Communication: Mobility status         Time: 1342-1405 PT Time Calculation (min) (ACUTE ONLY): 23 min   Charges:   PT Evaluation $PT Eval Low Complexity: 1 Procedure PT Treatments $Gait Training: 8-22 mins   PT G Codes:        [name] A Sulema Braid 04/02/2016, 2:11 PM  Wray Kearns, [place], DPT [phone]

## 2016-04-02 NOTE — Progress Notes (Signed)
Orthopedic Tech Progress Note Patient Details:  [name] March 30, 1946 [id number]  Ortho Devices Ortho Device/Splint Interventions: Application   Hildred Priest 04/02/2016, 1:13 PM

## 2016-04-02 NOTE — Anesthesia Procedure Notes (Signed)
Spinal  Patient location during procedure: OR Start time: 04/02/2016 7:22 AM End time: 04/02/2016 7:24 AM Staffing Anesthesiologist: Suella Broad D Performed: anesthesiologist  Preanesthetic Checklist Completed: patient identified, site marked, surgical consent, pre-op evaluation, timeout performed, IV checked, risks and benefits discussed and monitors and equipment checked Spinal Block Patient position: sitting Prep: Betadine Patient monitoring: heart rate, continuous pulse ox, blood pressure and cardiac monitor Approach: midline Location: L4-5 Injection technique: single-shot Needle Needle type: Introducer and Pencan  Needle gauge: 24 G Needle length: 9 cm Additional Notes Negative paresthesia. Negative blood return. Positive free-flowing CSF. Expiration date of kit checked and confirmed. Patient tolerated procedure well, without complications.

## 2016-04-02 NOTE — Progress Notes (Signed)
22Orthopedic Tech Progress Note Patient Details:  [name] [birth year] [id number] Off cpm at 1540 Patient ID: [name], female   DOB: December 03, 1945, 70 y.o.   MRN: [id number]   [name] 04/02/2016, 3:38 PM

## 2016-04-02 NOTE — Discharge Instructions (Signed)
Information on my medicine - ELIQUIS® (apixaban) ° °This medication education was reviewed with me or my healthcare representative as part of my discharge preparation.  The pharmacist that spoke with me during my hospital stay was:  Shaden Lacher Brown, RPH ° °Why was Eliquis® prescribed for you? °Eliquis® was prescribed for you to reduce the risk of blood clots forming after orthopedic surgery.   ° °What do You need to know about Eliquis®? °Take your Eliquis® TWICE DAILY - one tablet in the morning and one tablet in the evening with or without food.  It would be best to take the dose about the same time each day. ° °If you have difficulty swallowing the tablet whole please discuss with your pharmacist how to take the medication safely. ° °Take Eliquis® exactly as prescribed by your doctor and DO NOT stop taking Eliquis® without talking to the doctor who prescribed the medication.  Stopping without other medication to take the place of Eliquis® may increase your risk of developing a clot. ° °After discharge, you should have regular check-up appointments with your healthcare provider that is prescribing your Eliquis®. ° °What do you do if you miss a dose? °If a dose of ELIQUIS® is not taken at the scheduled time, take it as soon as possible on the same day and twice-daily administration should be resumed.  The dose should not be doubled to make up for a missed dose.  Do not take more than one tablet of ELIQUIS at the same time. ° °Important Safety Information °A possible side effect of Eliquis® is bleeding. You should call your healthcare provider right away if you experience any of the following: °? Bleeding from an injury or your nose that does not stop. °? Unusual colored urine (red or dark brown) or unusual colored stools (red or black). °? Unusual bruising for unknown reasons. °? A serious fall or if you hit your head (even if there is no bleeding). ° °Some medicines may interact with Eliquis® and might  increase your risk of bleeding or clotting while on Eliquis®. To help avoid this, consult your healthcare provider or pharmacist prior to using any new prescription or non-prescription medications, including herbals, vitamins, non-steroidal anti-inflammatory drugs (NSAIDs) and supplements. ° °This website has more information on Eliquis® (apixaban): http://www.eliquis.com/eliquis/home ° °

## 2016-04-02 NOTE — Anesthesia Procedure Notes (Signed)
Procedure Name: MAC Date/Time: 04/02/2016 7:35 AM Performed by: Willeen Cass P Pre-anesthesia Checklist: Patient identified, Emergency Drugs available, Suction available, Patient being monitored and Timeout performed Patient Re-evaluated:Patient Re-evaluated prior to inductionOxygen Delivery Method: Simple face mask Intubation Type: IV induction Placement Confirmation: positive ETCO2 Dental Injury: Teeth and Oropharynx as per pre-operative assessment

## 2016-04-02 NOTE — Op Note (Signed)
MRN:     [id number] DOB/AGE:    May 08, 1945 / 70 y.o.       OPERATIVE REPORT    DATE OF PROCEDURE:  04/02/2016       PREOPERATIVE DIAGNOSIS:   PRIMARY LOCALIZED OA RIGHT KNEE      Estimated body mass index is 40.63 kg/m as calculated from the following:   Height as of this encounter: 5\' 3"  (1.6 m).   Weight as of this encounter: 104 kg (229 lb 6 oz).                                                        POSTOPERATIVE DIAGNOSIS:   SAME                                                                      PROCEDURE:  Procedure(s): RIGHT TOTAL KNEE ARTHROPLASTY Using Depuy Attune RP implants #5 Femur, #5Tibia, 67mm  RP bearing, 32 Patella     SURGEON: Janae Bonser A    ASSISTANT:  Kirstin Shepperson PA-C   (Present and scrubbed throughout the case, critical for assistance with exposure, retraction, instrumentation, and closure.)         ANESTHESIA: Spinal with Adductor Nerve Block     TOURNIQUET TIME: [id number]   COMPLICATIONS:  None     SPECIMENS: None   INDICATIONS FOR PROCEDURE: The patient has  DJD RIGHT KNEE, varus deformities, XR shows bone on bone arthritis. Patient has failed all conservative measures including anti-inflammatory medicines, narcotics, attempts at  exercise and weight loss, cortisone injections and viscosupplementation.  Risks and benefits of surgery have been discussed, questions answered.   DESCRIPTION OF PROCEDURE: The patient identified by armband, received  right femoral nerve block and IV antibiotics, in the holding area at [facility]. Patient taken to the operating room, appropriate anesthetic  monitors were attached Spinal anesthesia induced with  the patient in supine position, Foley catheter was inserted. Tourniquet  applied high to the operative thigh. Lateral post and foot positioner  applied to the table, the lower extremity was then prepped and draped  in usual sterile fashion from the ankle to the tourniquet. Time-out procedure was performed.  The limb was wrapped with an Esmarch bandage and the tourniquet inflated to 365 mmHg. We began the operation by making the anterior midline incision starting at handbreadth above the patella going over the patella 1 cm medial to and  4 cm distal to the tibial tubercle. Small bleeders in the skin and the  subcutaneous tissue identified and cauterized. Transverse retinaculum was incised and reflected medially and a medial parapatellar arthrotomy was accomplished. the patella was everted and theprepatellar fat pad resected. The superficial medial collateral  ligament was then elevated from anterior to posterior along the proximal  flare of the tibia and anterior half of the menisci resected. The knee was hyperflexed exposing bone on bone arthritis. Peripheral and notch osteophytes as well as the cruciate ligaments were then resected. We continued to  work our way around posteriorly along the proximal tibia, and externally  rotated the tibia  subluxing it out from underneath the femur. A McHale  retractor was placed through the notch and a lateral Hohmann retractor  placed, and we then drilled through the proximal tibia in line with the  axis of the tibia followed by an intramedullary guide rod and 2-degree  posterior slope cutting guide. The tibial cutting guide was pinned into place  allowing resection of 4 mm of bone medially and about 6 mm of bone  laterally because of her varus deformity. Satisfied with the tibial resection, we then  entered the distal femur 2 mm anterior to the PCL origin with the  intramedullary guide rod and applied the distal femoral cutting guide  set at 33mm, with 5 degrees of valgus. This was pinned along the  epicondylar axis. At this point, the distal femoral cut was accomplished without difficulty. We then sized for a #5 femoral component and pinned the guide in 3 degrees of external rotation.The chamfer cutting guide was pinned into place. The anterior, posterior, and  chamfer cuts were accomplished without difficulty followed by  the  RP box cutting guide and the box cut. We also removed posterior osteophytes from the posterior femoral condyles. At this  time, the knee was brought into full extension. We checked our  extension and flexion gaps and found them symmetric at 25mm.  The patella thickness measured at 25 mm. We set the cutting guide at 15 and removed the posterior 9.5-10 mm  of the patella sized for 32 button and drilled the lollipop. The knee  was then once again hyperflexed exposing the proximal tibia. We sized for a #5 tibial base plate, applied the smokestack and the conical reamer followed by the the Delta fin keel punch. We then hammered into place the  RP trial femoral component, inserted a 1 trial bearing, trial patellar button, and took the knee through range of motion from 0-130 degrees. No thumb pressure was required for patellar  tracking. At this point, all trial components were removed, a double batch of DePuy HV cement  was mixed and applied to all bony metallic mating surfaces except for the posterior condyles of the femur itself. In order, we  hammered into place the tibial tray and removed excess cement, the femoral component and removed excess cement, a 47mm  RP bearing  was inserted, and the knee brought to full extension with compression.  The patellar button was clamped into place, and excess cement  removed. While the cement cured the wound was irrigated out with normal saline solution pulse lavage.. Ligament stability and patellar tracking were checked and found to be excellent.. The parapatellar arthrotomy was closed with  #1 Vicryl suture. The subcutaneous tissue with 0 and 2-0 undyed  Vicryl suture, and 4-0 Monocryl.. A dressing of Aquaseal,  4 x 4, dressing sponges, Webril, and Ace wrap applied. Needle and sponge count were correct times 2.The patient awakened, extubated, and taken to recovery room without difficulty. Vascular  status was normal, pulses 2+ and symmetric.   [name] A 04/02/2016, 9:08 AM

## 2016-04-02 NOTE — Interval H&P Note (Signed)
History and Physical Interval Note:  04/02/2016 6:43 AM  [name]  has presented today for surgery, with the diagnosis of PRIMARY LOCALIZED OA RIGHT KNEE  The various methods of treatment have been discussed with the patient and family. After consideration of risks, benefits and other options for treatment, the patient has consented to  Procedure(s): TOTAL KNEE ARTHROPLASTY (Right) as a surgical intervention .  The patient's history has been reviewed, patient examined, no change in status, stable for surgery.  I have reviewed the patient's chart and labs.  Questions were answered to the patient's satisfaction.     [name]

## 2016-04-02 NOTE — Progress Notes (Signed)
Orthopedic Tech Progress Note Patient Details:  DELOISE BROSIUS Jul 18, 1945 [id number]  CPM Right Knee CPM Right Knee: On Right Knee Flexion (Degrees): 90 Right Knee Extension (Degrees): 0   Chari Parmenter 04/02/2016, 10:55 AM Ortho will provide ohf and footsie roll when it becomes available; RN notified

## 2016-04-03 ENCOUNTER — Encounter (HOSPITAL_COMMUNITY): Payer: Self-pay | Admitting: Physician Assistant

## 2016-04-03 DIAGNOSIS — IMO0001 Reserved for inherently not codable concepts without codable children: Secondary | ICD-10-CM

## 2016-04-03 DIAGNOSIS — D62 Acute posthemorrhagic anemia: Secondary | ICD-10-CM | POA: Diagnosis not present

## 2016-04-03 HISTORY — DX: Acute posthemorrhagic anemia: D62

## 2016-04-03 LAB — CBC
HCT: 26.4 % — ABNORMAL LOW (ref 36.0–46.0)
HEMOGLOBIN: 8.5 g/dL — AB (ref 12.0–15.0)
MCH: 27 pg (ref 26.0–34.0)
MCHC: 32.2 g/dL (ref 30.0–36.0)
MCV: 83.8 fL (ref 78.0–100.0)
Platelets: 231 10*3/uL (ref 150–400)
RBC: 3.15 MIL/uL — ABNORMAL LOW (ref 3.87–5.11)
RDW: 13.5 % (ref 11.5–15.5)
WBC: 8.3 10*3/uL (ref 4.0–10.5)

## 2016-04-03 LAB — GLUCOSE, CAPILLARY
GLUCOSE-CAPILLARY: 161 mg/dL — AB (ref 65–99)
GLUCOSE-CAPILLARY: 188 mg/dL — AB (ref 65–99)
Glucose-Capillary: 159 mg/dL — ABNORMAL HIGH (ref 65–99)

## 2016-04-03 LAB — BASIC METABOLIC PANEL
Anion gap: 8 (ref 5–15)
BUN: 16 mg/dL (ref 6–20)
CALCIUM: 8.8 mg/dL — AB (ref 8.9–10.3)
CHLORIDE: 107 mmol/L (ref 101–111)
CO2: 22 mmol/L (ref 22–32)
CREATININE: 1.15 mg/dL — AB (ref 0.44–1.00)
GFR, EST AFRICAN AMERICAN: 55 mL/min — AB (ref >60)
GFR, EST NON AFRICAN AMERICAN: 47 mL/min — AB (ref >60)
Glucose, Bld: 170 mg/dL — ABNORMAL HIGH (ref 65–99)
Potassium: 4.5 mmol/L (ref 3.5–5.1)
SODIUM: 137 mmol/L (ref 135–145)

## 2016-04-03 LAB — PREPARE RBC (CROSSMATCH)

## 2016-04-03 MED ORDER — INSULIN ASPART 100 UNIT/ML ~~LOC~~ SOLN: 0.0000 [IU] | Freq: Every day | SUBCUTANEOUS | Status: DC

## 2016-04-03 MED ORDER — INSULIN ASPART 100 UNIT/ML ~~LOC~~ SOLN
0.0000 [IU] | Freq: Three times a day (TID) | SUBCUTANEOUS | Status: DC
  Administered 2016-04-03 (×2): 3 [IU] via SUBCUTANEOUS
  Administered 2016-04-05: 2 [IU] via SUBCUTANEOUS

## 2016-04-03 MED ORDER — SODIUM CHLORIDE 0.9 % IV SOLN
Freq: Once | INTRAVENOUS | Status: AC
  Administered 2016-04-03: 10 mL via INTRAVENOUS

## 2016-04-03 MED ORDER — FUROSEMIDE 10 MG/ML IJ SOLN
20.0000 mg | Freq: Once | INTRAMUSCULAR | Status: AC
  Administered 2016-04-03: 20 mg via INTRAVENOUS
  Filled 2016-04-03: qty 2

## 2016-04-03 NOTE — Progress Notes (Signed)
OT Cancellation Note  Patient Details Name: [name] MRN: [id number] DOB: Mar 22, 1946   Cancelled Treatment:    Reason Eval/Treat Not Completed: Other (comment) Pt's  current D/C plan is SNF. No apparent immediate acute care OT needs, therefore will defer OT to SNF. If OT eval is needed please call Acute Rehab Dept. at [phone] or text page OT at [phone].    Shannon [place], OTR/L  [id number] 04/03/2016 04/03/2016, 2:41 PM

## 2016-04-03 NOTE — Anesthesia Postprocedure Evaluation (Signed)
Anesthesia Post Note  Patient: [name]  Procedure(s) Performed: Procedure(s) (LRB): RIGHT TOTAL KNEE ARTHROPLASTY (Right)  Patient location during evaluation: PACU Anesthesia Type: Spinal Level of consciousness: oriented and awake and alert Pain management: pain level controlled Vital Signs Assessment: post-procedure vital signs reviewed and stable Respiratory status: spontaneous breathing, respiratory function stable and patient connected to nasal cannula oxygen Cardiovascular status: blood pressure returned to baseline and stable Postop Assessment: no headache, no backache and spinal receding Anesthetic complications: no    Last Vitals:  Vitals:   04/03/16 0014 04/03/16 0500  BP: 117/66 (!) 102/55  Pulse: (!) 55 70  Resp: 16 18  Temp: 36.8 C 37 C    Last Pain:  Vitals:   04/03/16 0619  TempSrc:   PainSc: [place]

## 2016-04-03 NOTE — NC FL2 (Signed)
[place] LEVEL OF CARE SCREENING TOOL     IDENTIFICATION  Patient Name: [name] Birthdate: 16-Jul-1945 Sex: female Admission Date (Current Location): 04/02/2016  [facility] and [place] Number:  Herbalist and Address:  The . [facility], [place] [address] [place], [place] [address]      Provider Number: [id number]  Attending Physician Name and Address:  Elsie Saas, MD  Relative Name and Phone Number:       Current Level of Care: Hospital Recommended Level of Care:   Prior Approval Number:    Date Approved/Denied: 04/03/16 PASRR Number: [id number]  Discharge Plan: SNF    Current Diagnoses: Patient Active Problem List   Diagnosis Date Noted  . Acute blood loss as cause of postoperative anemia 04/03/2016  . Class 3 obesity due to excess calories with body mass index (BMI) of 40.0 to 44.9 in adult [facility])   . Primary localized osteoarthritis of right knee   . CKD (chronic kidney disease), stage III 10/22/2015  . Syncope 10/21/2015  . Non-insulin dependent type 2 diabetes mellitus ([place]) 10/21/2015  . Hypothyroidism 10/21/2015  . 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 Right rotator cuff tear   . Anxiety   . PONV (postoperative nausea and vomiting)   . GERD (gastroesophageal reflux disease)   . Arthritis   . Thyroid disease   . DIVERTICULOSIS-COLON 07/04/2009  . Iron deficiency anemia 03/17/2009  . PERSONAL HX COLONIC POLYPS 03/17/2009  . SKIN RASH 02/23/2009  . DIARRHEA 02/23/2009  . FEVER, HX OF 02/23/2009  . THYROID NODULE, LEFT 02/11/2009  . Glaucoma 02/11/2009  . Coronary atherosclerosis 02/11/2009  . 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 DEGENERATIVE JOINT DISEASE, ANKLE 02/11/2009  . Osteoarthritis of spine 02/11/2009  . Essential hypertension 02/09/2009    Orientation RESPIRATION BLADDER Height & Weight     Self, Time, Situation, Place  Normal Continent Weight: 229  lb 6 oz (104 kg) Height:  5\' 3"  (160 cm)  BEHAVIORAL SYMPTOMS/MOOD NEUROLOGICAL BOWEL NUTRITION STATUS      Continent Diet (Carb modified, thin liquids. Subject to change please see discharge summary.)  AMBULATORY STATUS COMMUNICATION OF NEEDS Skin   Limited Assist Verbally Surgical wounds (Closed incision right knee, compression wrap dressing.)                       Personal Care Assistance Level of Assistance  Bathing, Feeding, Dressing Bathing Assistance: Limited assistance Feeding assistance: Independent Dressing Assistance: Limited assistance     Functional Limitations Info  Sight, Hearing, Speech Sight Info: Adequate Hearing Info: Adequate Speech Info: Adequate    SPECIAL CARE FACTORS FREQUENCY  PT (By licensed PT), OT (By licensed OT)     PT Frequency: min 7x week OT Frequency:  min 7x week            Contractures Contractures Info: Not present    Additional Factors Info  Code Status, Allergies Code Status Info: Full Allergies Info: Contrast Media Iodinated Diagnostic Agents, Adhesive Tape, Esomeprazole Magnesium, Morphine And Related, Talwin Pentazocine, Allopurinol, Codeine, Colchicine, Hydrocodone, Meperidine Hcl, Oxycodone, Pentazocine Lactate           Current Medications (04/03/2016):  This is the current hospital active medication list Current Facility-Administered Medications  Medication Dose Route Frequency Provider Last Rate Last Dose  . 0.9 % NaCl with KCl 20 mEq/ L  infusion   Intravenous Continuous Kirstin Shepperson, PA-C 100 mL/hr at 04/03/16 0019    .  acetaminophen (TYLENOL) tablet 650 mg  650 mg Oral Q6H PRN Kirstin Shepperson, PA-C   650 mg at 04/03/16 1417   Or  . acetaminophen (TYLENOL) suppository 650 mg  650 mg Rectal Q6H PRN Kirstin Shepperson, PA-C      . ALPRAZolam Duanne Moron) tablet 0.5 mg  0.5 mg Oral TID PRN Kirstin Shepperson, PA-C   0.5 mg at 04/02/16 2323  . alum & mag hydroxide-simeth (MAALOX/MYLANTA) 200-200-20 MG/5ML  suspension 30 mL  30 mL Oral Q4H PRN Kirstin Shepperson, PA-C      . apixaban (ELIQUIS) tablet 2.5 mg  2.5 mg Oral Q12H Kirstin Shepperson, PA-C   2.5 mg at 04/03/16 0841  . atenolol (TENORMIN) tablet 50 mg  50 mg Oral Daily Kirstin Shepperson, PA-C   50 mg at 04/03/16 0841  . dexamethasone (DECADRON) injection 10 mg  10 mg Intravenous Q8H Kirstin Shepperson, PA-C   10 mg at 04/03/16 0840  . diphenhydrAMINE (BENADRYL) 12.5 MG/5ML elixir 12.5-25 mg  12.5-25 mg Oral Q4H PRN Kirstin Shepperson, PA-C      . docusate sodium (COLACE) capsule 100 mg  100 mg Oral BID Kirstin Shepperson, PA-C   100 mg at 04/03/16 0841  . febuxostat (ULORIC) tablet 40 mg  40 mg Oral Daily Kirstin Shepperson, PA-C   40 mg at 04/03/16 0935  . gabapentin (NEURONTIN) capsule 300 mg  300 mg Oral QHS Kirstin Shepperson, PA-C   300 mg at 04/02/16 2322  . HYDROmorphone (DILAUDID) tablet 2-4 mg  2-4 mg Oral Q3H PRN Kirstin Shepperson, PA-C   2 mg at 04/03/16 1221  . insulin aspart (novoLOG) injection 0-15 Units  0-15 Units Subcutaneous TID WC Kirstin Shepperson, PA-C   3 Units at 04/03/16 1221  . insulin aspart (novoLOG) injection 0-5 Units  0-5 Units Subcutaneous QHS Kirstin Shepperson, PA-C      . latanoprost (XALATAN) 0.005 % ophthalmic solution 1 drop  1 drop Both Eyes QHS Kirstin Shepperson, PA-C   1 drop at 04/02/16 2324  . [START ON 04/08/2016] levothyroxine (SYNTHROID, LEVOTHROID) tablet 100 mcg  100 mcg Oral Once per day on Sun Elsie Saas, MD      . levothyroxine (SYNTHROID, LEVOTHROID) tablet 50 mcg  50 mcg Oral Once per day on Mon Tue Wed Thu Fri Sat Kirstin Shepperson, PA-C   50 mcg at 04/03/16 [id number]  . menthol-cetylpyridinium (CEPACOL) lozenge 3 mg  1 lozenge Oral PRN Kirstin Shepperson, PA-C   3 mg at 04/02/16 1317   Or  . phenol (CHLORASEPTIC) mouth spray 1 spray  1 spray Mouth/Throat PRN Kirstin Shepperson, PA-C      . metoCLOPramide (REGLAN) tablet 5-10 mg  5-10 mg Oral Q8H PRN Kirstin Shepperson, PA-C       Or  .  metoCLOPramide (REGLAN) injection 5-10 mg  5-10 mg Intravenous Q8H PRN Kirstin Shepperson, PA-C      . morphine 2 MG/ML injection 2 mg  2 mg Intravenous Q2H PRN Kirstin Shepperson, PA-C   2 mg at 04/02/16 1424  . ondansetron (ZOFRAN) tablet 4 mg  4 mg Oral Q6H PRN Kirstin Shepperson, PA-C       Or  . ondansetron (ZOFRAN) injection 4 mg  4 mg Intravenous Q6H PRN Kirstin Shepperson, PA-C      . polyethylene glycol (MIRALAX / GLYCOLAX) packet 17 g  17 g Oral BID Kirstin Shepperson, PA-C   17 g at 04/03/16 0841  . pravastatin (PRAVACHOL) tablet 20 mg  20 mg Oral q1800 Kirstin Shepperson, PA-C   20 mg  at 04/02/16 1805     Discharge Medications: Please see discharge summary for a list of discharge medications.  Relevant Imaging Results:  Relevant Lab Results:   Additional Information SSN: [id number]  Alla German, LCSW

## 2016-04-03 NOTE — Progress Notes (Signed)
Physical Therapy Treatment Patient Details Name: [name] MRN: [id number] DOB: 1946-03-07 Today's Date: 04/03/2016    History of Present Illness Patient is a 70 y/o female with hx of HTN, DM, CKD, anxiety, glaucoma, wolf-parkinson-white syndrome presents s/p Rt TKA.    PT Comments    Patient is progressing toward mobility goals. Continue to progress as tolerated with anticipated d/c to SNF for further skilled PT services.    Follow Up Recommendations  SNF     Equipment Recommendations  None recommended by PT    Recommendations for Other Services OT consult     Precautions / Restrictions Precautions Precautions: Knee Precaution Comments: Reviewed no pillow under knee and precautions Restrictions Weight Bearing Restrictions: Yes RLE Weight Bearing: Weight bearing as tolerated    Mobility  Bed Mobility Overal bed mobility: Needs Assistance Bed Mobility: Supine to Sit;Sit to Supine     Supine to sit: Min guard;HOB elevated Sit to supine: Min guard   General bed mobility comments: use of rails, HOB elevated, and increased time and effort; min guard for safety  Transfers Overall transfer level: Needs assistance Equipment used: Rolling walker (2 wheeled) Transfers: Sit to/from Stand Sit to Stand: Min assist         General transfer comment: assist to power up into standing; cues for safe hand placement  Ambulation/Gait Ambulation/Gait assistance: Min guard Ambulation Distance (Feet): 80 Feet Assistive device: Rolling walker (2 wheeled) Gait Pattern/deviations: Step-through pattern;Decreased stance time - right;Decreased step length - left;Decreased weight shift to right;Antalgic Gait velocity: decreased   General Gait Details: cues for posture and step length symmetry   Stairs            Wheelchair Mobility    Modified Rankin (Stroke Patients Only)       Balance                                    Cognition  Arousal/Alertness: Awake/alert Behavior During Therapy: WFL for tasks assessed/performed Overall Cognitive Status: Within Functional Limits for tasks assessed                      Exercises Total Joint Exercises Quad Sets: AROM;Right;10 reps Heel Slides: AROM;AAROM;Right;10 reps Straight Leg Raises: AROM;Right;10 reps Long Arc Quad: AROM;Right;10 reps    General Comments        Pertinent Vitals/Pain Pain Assessment: 0-10 Pain Score: 5  Pain Location: R knee Pain Descriptors / Indicators: Aching Pain Intervention(s): Limited activity within patient's tolerance;Monitored during session;Repositioned;Patient requesting pain meds-RN notified    Home Living                      Prior Function            PT Goals (current goals can now be found in the care plan section) Acute Rehab PT Goals Patient Stated Goal: to go to rehab Progress towards PT goals: Progressing toward goals    Frequency    7X/week      PT Plan Current plan remains appropriate    Co-evaluation             End of Session Equipment Utilized During Treatment: Gait belt Activity Tolerance: Patient tolerated treatment well Patient left: in bed;in CPM;with call bell/phone within reach     Time: 1150-1217 PT Time Calculation (min) (ACUTE ONLY): 27 min  Charges:  $Gait Training: 8-22 mins $Therapeutic  Exercise: 8-22 mins                    G Codes:      Salina April, PTA Pager: [phone]   04/03/2016, 2:06 PM

## 2016-04-03 NOTE — Progress Notes (Signed)
Subjective: 1 Day Post-Op Procedure(s) (LRB): RIGHT TOTAL KNEE ARTHROPLASTY (Right) Patient reports pain as 5 on 0-10 scale.    Objective: Vital signs in last 24 hours: Temp:  [97.3 F (36.3 C)-98.6 F (37 C)] 98.6 F (37 C) (12/05 0500) Pulse Rate:  [47-80] 70 (12/05 0500) Resp:  [12-20] 18 (12/05 0500) BP: (102-148)/(55-86) 102/55 (12/05 0500) SpO2:  [97 %-100 %] 98 % (12/05 0500)  Intake/Output from previous day: 12/04 0701 - 12/05 0700 In: 4000.7 [P.O.:744; I.V.:3056.7; IV Piggyback:200] Out: 2175 [Urine:2125; Blood:50] Intake/Output this shift: No intake/output data recorded.   Recent Labs  04/03/16 0510  HGB 8.5*    Recent Labs  04/03/16 0510  WBC 8.3  RBC 3.15*  HCT 26.4*  PLT 231    Recent Labs  04/03/16 0510  NA 137  K 4.5  CL 107  CO2 22  BUN 16  CREATININE 1.15*  GLUCOSE 170*  CALCIUM 8.8*   No results for input(s): LABPT, INR in the last 72 hours.  ABD soft Neurovascular intact Sensation intact distally Intact pulses distally Incision: dressing C/D/I  Assessment/Plan: 1 Day Post-Op Procedure(s) (LRB): RIGHT TOTAL KNEE ARTHROPLASTY (Right)  Principal Problem:   Primary localized osteoarthritis of right knee Active Problems:   THYROID NODULE, LEFT   Iron deficiency anemia   Glaucoma   Essential hypertension   Coronary atherosclerosis   WOLFF (WOLFE)-PARKINSON-WHITE (WPW) SYNDROME   Osteoarthritis of spine   Anxiety   GERD (gastroesophageal reflux disease)   Lumbar stenosis with neurogenic claudication   Non-insulin dependent type 2 diabetes mellitus ([facility])   CKD (chronic kidney disease), stage III   Acute blood loss as cause of postoperative anemia  Advance diet Up with therapy D/C IV fluids Discharge to SNF Thursday Transfuse 2 units of PRBCs for post op blood loss anemia that is symptomatic.  [name] 04/03/2016, 9:08 AM

## 2016-04-04 LAB — BASIC METABOLIC PANEL
ANION GAP: 10 (ref 5–15)
BUN: 20 mg/dL (ref 6–20)
CHLORIDE: 107 mmol/L (ref 101–111)
CO2: 24 mmol/L (ref 22–32)
Calcium: 9 mg/dL (ref 8.9–10.3)
Creatinine, Ser: 1.16 mg/dL — ABNORMAL HIGH (ref 0.44–1.00)
GFR calc Af Amer: 54 mL/min — ABNORMAL LOW (ref >60)
GFR calc non Af Amer: 47 mL/min — ABNORMAL LOW (ref >60)
GLUCOSE: 139 mg/dL — AB (ref 65–99)
POTASSIUM: 4.2 mmol/L (ref 3.5–5.1)
Sodium: 141 mmol/L (ref 135–145)

## 2016-04-04 LAB — CBC
HEMATOCRIT: 31.9 % — AB (ref 36.0–46.0)
HEMOGLOBIN: 10.2 g/dL — AB (ref 12.0–15.0)
MCH: 27.1 pg (ref 26.0–34.0)
MCHC: 32 g/dL (ref 30.0–36.0)
MCV: 84.6 fL (ref 78.0–100.0)
Platelets: 191 10*3/uL (ref 150–400)
RBC: 3.77 MIL/uL — AB (ref 3.87–5.11)
RDW: 14 % (ref 11.5–15.5)
WBC: 10.5 10*3/uL (ref 4.0–10.5)

## 2016-04-04 LAB — GLUCOSE, CAPILLARY
GLUCOSE-CAPILLARY: 100 mg/dL — AB (ref 65–99)
GLUCOSE-CAPILLARY: 105 mg/dL — AB (ref 65–99)
GLUCOSE-CAPILLARY: 145 mg/dL — AB (ref 65–99)
Glucose-Capillary: 116 mg/dL — ABNORMAL HIGH (ref 65–99)

## 2016-04-04 LAB — TYPE AND SCREEN
BLOOD PRODUCT EXPIRATION DATE: 201712152359
Blood Product Expiration Date: 201712192359
ISSUE DATE / TIME: 201712051402
ISSUE DATE / TIME: 201712051049
Unit Type and Rh: 5100
Unit Type and Rh: 5100

## 2016-04-04 MED ORDER — POLYETHYLENE GLYCOL 3350 17 G PO PACK: PACK | ORAL | 0 refills | Status: DC

## 2016-04-04 MED ORDER — DOCUSATE SODIUM 100 MG PO CAPS: ORAL_CAPSULE | ORAL | 0 refills | Status: DC

## 2016-04-04 MED ORDER — APIXABAN 2.5 MG PO TABS: 2.5000 mg | ORAL_TABLET | Freq: Two times a day (BID) | ORAL | 0 refills | Status: DC

## 2016-04-04 MED ORDER — HYDROMORPHONE HCL 2 MG PO TABS: ORAL_TABLET | ORAL | 0 refills | Status: DC

## 2016-04-04 MED ORDER — ACETAMINOPHEN 325 MG PO TABS: 650.0000 mg | ORAL_TABLET | Freq: Four times a day (QID) | ORAL | Status: DC | PRN

## 2016-04-04 NOTE — NC FL2 (Signed)
[place] LEVEL OF CARE SCREENING TOOL     IDENTIFICATION  Patient Name: [name] Birthdate: 05/07/1945 Sex: female Admission Date (Current Location): 04/02/2016  [facility] and [place] Number:  Herbalist and Address:  [facility] [place], [place] [address] [place],  [address]      Provider Number: [id number]  Attending Physician Name and Address:  Elsie Saas, MD  Relative Name and Phone Number:       Current Level of Care: Hospital Recommended Level of Care: [place] Prior Approval Number:    Date Approved/Denied: 04/03/16 PASRR Number: [id number]  Discharge Plan: SNF    Current Diagnoses: Patient Active Problem List   Diagnosis Date Noted  . Acute blood loss as cause of postoperative anemia 04/03/2016  . Class 3 obesity due to excess calories with body mass index (BMI) of 40.0 to 44.9 in adult [facility])   . Primary localized osteoarthritis of right knee   . CKD (chronic kidney disease), stage III 10/22/2015  . Syncope 10/21/2015  . Non-insulin dependent type 2 diabetes mellitus ([place]) 10/21/2015  . Hypothyroidism 10/21/2015  . 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 Right rotator cuff tear   . Anxiety   . PONV (postoperative nausea and vomiting)   . GERD (gastroesophageal reflux disease)   . Arthritis   . Thyroid disease   . DIVERTICULOSIS-COLON 07/04/2009  . Iron deficiency anemia 03/17/2009  . PERSONAL HX COLONIC POLYPS 03/17/2009  . SKIN RASH 02/23/2009  . DIARRHEA 02/23/2009  . FEVER, HX OF 02/23/2009  . THYROID NODULE, LEFT 02/11/2009  . Glaucoma 02/11/2009  . Coronary atherosclerosis 02/11/2009  . 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 DEGENERATIVE JOINT DISEASE, ANKLE 02/11/2009  . Osteoarthritis of spine 02/11/2009  . Essential hypertension 02/09/2009    Orientation RESPIRATION BLADDER Height & Weight     Self, Time, Situation, Place  Normal  Continent Weight: 229 lb 6 oz (104 kg) Height:  5\' 3"  (160 cm)  BEHAVIORAL SYMPTOMS/MOOD NEUROLOGICAL BOWEL NUTRITION STATUS      Continent Diet (Carb modified, thin liquids. Please see d.c summary)  AMBULATORY STATUS COMMUNICATION OF NEEDS Skin   Limited Assist Verbally Surgical wounds (Closed incision right knee, compression wrap)                       Personal Care Assistance Level of Assistance  Bathing, Feeding, Dressing Bathing Assistance: Limited assistance Feeding assistance: Independent Dressing Assistance: Limited assistance     Functional Limitations Info  Sight, Hearing, Speech Sight Info: Adequate Hearing Info: Adequate Speech Info: Adequate    SPECIAL CARE FACTORS FREQUENCY  PT (By licensed PT), OT (By licensed OT)     PT Frequency: 7x week OT Frequency:  7x week             Contractures Contractures Info: Not present    Additional Factors Info  Code Status, Allergies Code Status Info: Full Allergies Info: Contrast Media Iodinated Diagnostic Agents, Adhesive Tape, Esomeprazole Magnesium, Morphine And Related, [place] Pentazocine, Allopurinol, Codeine, Colchicine, Hydrocodone, Meperidine Hcl, Oxycodone, Pentazocine Lactate           Current Medications (04/04/2016):  This is the current hospital active medication list Current Facility-Administered Medications  Medication Dose Route Frequency Provider Last Rate Last Dose  . 0.9 % NaCl with KCl 20 mEq/ L  infusion   Intravenous Continuous Kirstin Shepperson, PA-C 100 mL/hr at 04/03/16 0019    . acetaminophen (TYLENOL)  tablet 650 mg  650 mg Oral Q6H PRN Kirstin Shepperson, PA-C   650 mg at 04/03/16 1417   Or  . acetaminophen (TYLENOL) suppository 650 mg  650 mg Rectal Q6H PRN Kirstin Shepperson, PA-C      . ALPRAZolam Duanne Moron) tablet 0.5 mg  0.5 mg Oral TID PRN Kirstin Shepperson, PA-C   0.5 mg at 04/03/16 2205  . alum & mag hydroxide-simeth (MAALOX/MYLANTA) 200-200-20 MG/5ML suspension 30 mL  30 mL Oral  Q4H PRN Kirstin Shepperson, PA-C      . apixaban (ELIQUIS) tablet 2.5 mg  2.5 mg Oral Q12H Kirstin Shepperson, PA-C   2.5 mg at 04/04/16 0905  . atenolol (TENORMIN) tablet 50 mg  50 mg Oral Daily Kirstin Shepperson, PA-C   50 mg at 04/04/16 0905  . diphenhydrAMINE (BENADRYL) 12.5 MG/5ML elixir 12.5-25 mg  12.5-25 mg Oral Q4H PRN Kirstin Shepperson, PA-C      . docusate sodium (COLACE) capsule 100 mg  100 mg Oral BID Kirstin Shepperson, PA-C   100 mg at 04/04/16 0905  . febuxostat (ULORIC) tablet 40 mg  40 mg Oral Daily Kirstin Shepperson, PA-C   40 mg at 04/04/16 0910  . gabapentin (NEURONTIN) capsule 300 mg  300 mg Oral QHS Kirstin Shepperson, PA-C   300 mg at 04/03/16 2051  . HYDROmorphone (DILAUDID) tablet 2-4 mg  2-4 mg Oral Q3H PRN Kirstin Shepperson, PA-C   2 mg at 04/04/16 0905  . insulin aspart (novoLOG) injection 0-15 Units  0-15 Units Subcutaneous TID WC Kirstin Shepperson, PA-C   3 Units at 04/03/16 1702  . insulin aspart (novoLOG) injection 0-5 Units  0-5 Units Subcutaneous QHS Kirstin Shepperson, PA-C      . latanoprost (XALATAN) 0.005 % ophthalmic solution 1 drop  1 drop Both Eyes QHS Kirstin Shepperson, PA-C   1 drop at 04/03/16 2206  . [START ON 04/08/2016] levothyroxine (SYNTHROID, LEVOTHROID) tablet 100 mcg  100 mcg Oral Once per day on Sun Elsie Saas, MD      . levothyroxine (SYNTHROID, LEVOTHROID) tablet 50 mcg  50 mcg Oral Once per day on Mon Tue Wed Thu Fri Sat Kirstin Shepperson, PA-C   50 mcg at 04/04/16 [id number]  . menthol-cetylpyridinium (CEPACOL) lozenge 3 mg  1 lozenge Oral PRN Kirstin Shepperson, PA-C   3 mg at 04/02/16 1317   Or  . phenol (CHLORASEPTIC) mouth spray 1 spray  1 spray Mouth/Throat PRN Kirstin Shepperson, PA-C      . metoCLOPramide (REGLAN) tablet 5-10 mg  5-10 mg Oral Q8H PRN Kirstin Shepperson, PA-C       Or  . metoCLOPramide (REGLAN) injection 5-10 mg  5-10 mg Intravenous Q8H PRN Kirstin Shepperson, PA-C      . morphine 2 MG/ML injection 2 mg  2 mg  Intravenous Q2H PRN Kirstin Shepperson, PA-C   2 mg at 04/02/16 1424  . ondansetron (ZOFRAN) tablet 4 mg  4 mg Oral Q6H PRN Kirstin Shepperson, PA-C       Or  . ondansetron (ZOFRAN) injection 4 mg  4 mg Intravenous Q6H PRN Kirstin Shepperson, PA-C      . polyethylene glycol (MIRALAX / GLYCOLAX) packet 17 g  17 g Oral BID Kirstin Shepperson, PA-C   17 g at 04/04/16 0904  . pravastatin (PRAVACHOL) tablet 20 mg  20 mg Oral q1800 Kirstin Shepperson, PA-C   20 mg at 04/03/16 1702     Discharge Medications: Please see discharge summary for a list of discharge medications.  Relevant Imaging Results:  Relevant Lab Results:   Additional Information SSN: [id number]  Alla German, LCSW

## 2016-04-04 NOTE — Progress Notes (Signed)
Orthopedic Tech Progress Note Patient Details:  [name] 1946/01/29 [id number] Off cpm at 1810 Patient ID: [name], female   DOB: 1945/05/28, 70 y.o.   MRN: [id number]   [name] 04/04/2016, 6:07 PM

## 2016-04-04 NOTE — Discharge Summary (Signed)
Patient ID: [name] MRN: [id number] DOB/AGE: 11/13/1945 70 y.o.  Admit date: 04/02/2016 Discharge date: 04/05/2016  Admission Diagnoses:  Principal Problem:   Primary localized osteoarthritis of right knee Active Problems:   THYROID NODULE, LEFT   Iron deficiency anemia   Glaucoma   Essential hypertension   Coronary atherosclerosis   WOLFF (WOLFE)-PARKINSON-WHITE (WPW) SYNDROME   Osteoarthritis of spine   Anxiety   GERD (gastroesophageal reflux disease)   Lumbar stenosis with neurogenic claudication   Non-insulin dependent type 2 diabetes mellitus (H[facility])   CKD (chronic kidney disease), stage III   Acute blood loss as cause of postoperative anemia   Class 3 obesity due to excess calories with body mass index (BMI) of 40.0 to 44.9 in adult [facility])   Discharge Diagnoses:  Same  Past Medical History:  Diagnosis Date  . Acute blood loss as cause of postoperative anemia 04/03/2016  . Anemia    hx of  . Anxiety    takes Xanax daily as needed  . Arthritis   . Bronchitis    a month ago  . Chronic back pain    stenosis  . Chronic kidney disease    stage 3-sees Dr.J Patel  . Class 3 obesity due to excess calories with body mass index (BMI) of 40.0 to 44.9 in adult [facility])   . Diabetes mellitus    type II; takes Byetta and Invokana daily  . Glaucoma    uses Eye Drops  . Gout    takes Uloric daily  . History of blood transfusion    no abnormal reaction   . History of colon polyps   . Hypertension    takes HCTZ daily as well as Aldactone  . Hypothyroidism    takes Synthroid daily  . Pneumonia    many yrs ago  . PONV (postoperative nausea and vomiting)   . Primary localized osteoarthritis of right knee   . Stress incontinence   . Syncope   . Weakness    numbness and tingling in right hip/eg/  . Wears glasses   . Wolf-Parkinson-White syndrome    takes Atenolol daily    Surgeries: Procedure(s): RIGHT TOTAL KNEE ARTHROPLASTY on 04/02/2016   Consultants:    Discharged Condition: Improved  Hospital Course: [name] is an 70 y.o. female who was admitted 04/02/2016 for operative treatment ofPrimary localized osteoarthritis of right knee. Patient has severe unremitting pain that affects sleep, daily activities, and work/hobbies. After pre-op clearance the patient was taken to the operating room on 04/02/2016 and underwent  Procedure(s): RIGHT TOTAL KNEE ARTHROPLASTY.    Patient was given perioperative antibiotics: Anti-infectives    Start     Dose/Rate Route Frequency Ordered Stop   04/02/16 1400  ceFAZolin (ANCEF) IVPB 2g/100 mL premix     2 g 200 mL/hr over 30 Minutes Intravenous Every 6 hours 04/02/16 1301 04/02/16 2138   04/02/16 0700  ceFAZolin (ANCEF) 3 g in dextrose 5 % 50 mL IVPB     3 g 130 mL/hr over 30 Minutes Intravenous To ShortStay Surgical 04/01/16 1316 04/02/16 0720       Patient was given sequential compression devices, early ambulation, and chemoprophylaxis to prevent DVT.  Patient benefited maximally from hospital stay and there were no complications.    Recent vital signs: Patient Vitals for the past 24 hrs:  BP Temp Temp src Pulse Resp SpO2  04/04/16 0434 (!) 155/83 98.5 F (36.9 C) Oral (!) 53 16 95 %  04/03/16 2012 (!)  148/77 98.9 F (37.2 C) Oral (!) 50 16 96 %  04/03/16 1648 (!) 141/83 98.8 F (37.1 C) Oral (!) 50 18 95 %  04/03/16 1435 139/69 99 F (37.2 C) Oral (!) 55 18 99 %  04/03/16 1414 (!) 149/75 98.9 F (37.2 C) Oral 62 18 99 %     Recent laboratory studies:  Recent Labs  04/03/16 0510 04/04/16 0459  WBC 8.3 10.5  HGB 8.5* 10.2*  HCT 26.4* 31.9*  PLT 231 191  NA 137 141  K 4.5 4.2  CL 107 107  CO2 22 24  BUN 16 20  CREATININE 1.15* 1.16*  GLUCOSE 170* 139*  CALCIUM 8.8* 9.0     Discharge Medications:     Medication List    STOP taking these medications   azithromycin 250 MG tablet Commonly known as:  ZITHROMAX     TAKE these medications   acetaminophen 325 MG  tablet Commonly known as:  TYLENOL Take 2 tablets (650 mg total) by mouth every 6 (six) hours as needed for mild pain (or Fever >/= 101).   ALPRAZolam 0.5 MG tablet Commonly known as:  XANAX Take 0.5 mg by mouth 3 (three) times daily as needed for anxiety.   apixaban 2.5 MG Tabs tablet Commonly known as:  ELIQUIS Take 1 tablet (2.5 mg total) by mouth every 12 (twelve) hours.   atenolol 50 MG tablet Commonly known as:  TENORMIN Take 50 mg by mouth daily.   docusate sodium 100 MG capsule Commonly known as:  COLACE 1 tab 2 times a day while on narcotics.  STOOL SOFTENER   febuxostat 40 MG tablet Commonly known as:  ULORIC Take 40 mg by mouth daily.   gabapentin 300 MG capsule Commonly known as:  NEURONTIN Take 300 mg by mouth at bedtime.   HYDROmorphone 2 MG tablet Commonly known as:  DILAUDID 1-2 tablets every 4-6 hrs as needed for pain   latanoprost 0.005 % ophthalmic solution Commonly known as:  XALATAN Place 1 drop into both eyes at bedtime. Reported on 10/21/2015   levothyroxine 50 MCG tablet Commonly known as:  SYNTHROID, LEVOTHROID Take 50-100 mcg by mouth daily. Take 50 mcg daily, except 100 mcg by mouth on Sunday   polyethylene glycol packet Commonly known as:  MIRALAX / GLYCOLAX 17grams in 6 oz of water twice a day until bowel movement.  LAXITIVE.  Restart if two days since last bowel movement   pravastatin 20 MG tablet Commonly known as:  PRAVACHOL Take 20 mg by mouth daily.   TRULICITY [id number] Sopn Generic drug:  Dulaglutide Inject 0.5 mLs into the skin every 7 (seven) days. Wednesdays       Diagnostic Studies: No results found.  Disposition: 01-Home or Self Care  Discharge Instructions    CPM    Complete by:  As directed    Continuous passive motion machine (CPM):      Use the CPM from 0 to 90 for 6 hours per day.       You may break it up into 2 or 3 sessions per day.      Use CPM for 2 weeks or until you are told to stop.   Call MD /  Call 911    Complete by:  As directed    If you experience chest pain or shortness of breath, CALL 911 and be transported to the hospital emergency room.  If you develope a fever above 101 F, pus (white drainage) or increased  drainage or redness at the wound, or calf pain, call your surgeon's office.   Change dressing    Complete by:  As directed    apply TED hose.  DO NOT REMOVE BANDAGE OVER SURGICAL INCISION.  [place] WHOLE LEG INCLUDING OVER THE WATERPROOF BANDAGE WITH SOAP AND WATER EVERY DAY.   Constipation Prevention    Complete by:  As directed    Drink plenty of fluids.  Prune juice may be helpful.  You may use a stool softener, such as Colace (over the counter) 100 mg twice a day.  Use MiraLax (over the counter) for constipation as needed.   Diet - low sodium heart healthy    Complete by:  As directed    Discharge instructions    Complete by:  As directed    INSTRUCTIONS AFTER JOINT REPLACEMENT   Remove items at home which could result in a fall. This includes throw rugs or furniture in walking pathways ICE to the affected joint every three hours while awake for 30 minutes at a time, for at least the first 3-5 days, and then as needed for pain and swelling.  Continue to use ice for pain and swelling. You may notice swelling that will progress down to the foot and ankle.  This is normal after surgery.  Elevate your leg when you are not up walking on it.   Continue to use the breathing machine you got in the hospital (incentive spirometer) which will help keep your temperature down.  It is common for your temperature to cycle up and down following surgery, especially at night when you are not up moving around and exerting yourself.  The breathing machine keeps your lungs expanded and your temperature down.   DIET:  As you were doing prior to hospitalization, we recommend a well-balanced diet.  DRESSING / WOUND CARE / SHOWERING  Keep the surgical dressing until follow up.  The dressing is  water proof, so you can shower without any extra covering.  IF THE DRESSING FALLS OFF or the wound gets wet inside, change the dressing with sterile gauze.  Please use good hand washing techniques before changing the dressing.  Do not use any lotions or creams on the incision until instructed by your surgeon.    ACTIVITY  Increase activity slowly as tolerated, but follow the weight bearing instructions below.   No driving for 6 weeks or until further direction given by your physician.  You cannot drive while taking narcotics.  No lifting or carrying greater than 10 lbs. until further directed by your surgeon. Avoid periods of inactivity such as sitting longer than an hour when not asleep. This helps prevent blood clots.  You may return to work once you are authorized by your doctor.     WEIGHT BEARING   Weight bearing as tolerated with assist device (walker, cane, etc) as directed, use it as long as suggested by your surgeon or therapist, typically at least 2-3 weeks.   EXERCISES  Results after joint replacement surgery are often greatly improved when you follow the exercise, range of motion and muscle strengthening exercises prescribed by your doctor. Safety measures are also important to protect the joint from further injury. Any time any of these exercises cause you to have increased pain or swelling, decrease what you are doing until you are comfortable again and then slowly increase them. If you have problems or questions, call your caregiver or physical therapist for advice.   Rehabilitation is important following a  joint replacement. After just a few days of immobilization, the muscles of the leg can become weakened and shrink (atrophy).  These exercises are designed to build up the tone and strength of the thigh and leg muscles and to improve motion. Often times heat used for twenty to thirty minutes before working out will loosen up your tissues and help with improving the range of  motion but do not use heat for the first two weeks following surgery (sometimes heat can increase post-operative swelling).   These exercises can be done on a training (exercise) mat, on the floor, on a table or on a bed. Use whatever works the best and is most comfortable for you.    Use music or television while you are exercising so that the exercises are a pleasant break in your day. This will make your life better with the exercises acting as a break in your routine that you can look forward to.   Perform all exercises about fifteen times, three times per day or as directed.  You should exercise both the operative leg and the other leg as well.   Exercises include:  Quad Sets - Tighten up the muscle on the front of the thigh (Quad) and hold for 5-10 seconds.   Straight Leg Raises - With your knee straight (if you were given a brace, keep it on), lift the leg to 60 degrees, hold for 3 seconds, and slowly lower the leg.  Perform this exercise against resistance later as your leg gets stronger.  Leg Slides: Lying on your back, slowly slide your foot toward your buttocks, bending your knee up off the floor (only go as far as is comfortable). Then slowly slide your foot back down until your leg is flat on the floor again.  Angel Wings: Lying on your back spread your legs to the side as far apart as you can without causing discomfort.  Hamstring Strength:  Lying on your back, push your heel against the floor with your leg straight by tightening up the muscles of your buttocks.  Repeat, but this time bend your knee to a comfortable angle, and push your heel against the floor.  You may put a pillow under the heel to make it more comfortable if necessary.   A rehabilitation program following joint replacement surgery can speed recovery and prevent re-injury in the future due to weakened muscles. Contact your doctor or a physical therapist for more information on knee rehabilitation.     CONSTIPATION  Constipation is defined medically as fewer than three stools per week and severe constipation as less than one stool per week.  Even if you have a regular bowel pattern at home, your normal regimen is likely to be disrupted due to multiple reasons following surgery.  Combination of anesthesia, postoperative narcotics, change in appetite and fluid intake all can affect your bowels.   YOU MUST use at least one of the following options; they are listed in order of increasing strength to get the job done.  They are all available over the counter, and you may need to use some, POSSIBLY even all of these options:    Drink plenty of fluids (prune juice may be helpful) and high fiber foods Colace 100 mg by mouth twice a day  Senokot for constipation as directed and as needed Dulcolax (bisacodyl), take with full glass of water  Miralax (polyethylene glycol) once or twice a day as needed.  If you have tried all these things and  are unable to have a bowel movement in the first 3-4 days after surgery call either your surgeon or your primary doctor.    If you experience loose stools or diarrhea, hold the medications until you stool forms back up.  If your symptoms do not get better within 1 week or if they get worse, check with your doctor.  If you experience "the worst abdominal pain ever" or develop nausea or vomiting, please contact the office immediately for further recommendations for treatment.   ITCHING:  If you experience itching with your medications, try taking only a single pain pill, or even half a pain pill at a time.  You can also use Benadryl over the counter for itching or also to help with sleep.   TED HOSE STOCKINGS:  Use stockings on both legs until for at least 2 weeks or as directed by physician office. They may be removed at night for sleeping.  MEDICATIONS:  See your medication summary on the "After Visit Summary" that nursing will review with you.  You may have some  home medications which will be placed on hold until you complete the course of blood thinner medication.  It is important for you to complete the blood thinner medication as prescribed.  PRECAUTIONS:  If you experience chest pain or shortness of breath - call 911 immediately for transfer to the hospital emergency department.   If you develop a fever greater that 101 F, purulent drainage from wound, increased redness or drainage from wound, foul odor from the wound/dressing, or calf pain - CONTACT YOUR SURGEON.                                                   FOLLOW-UP APPOINTMENTS:  If you do not already have a post-op appointment, please call the office for an appointment to be seen by your surgeon.  Guidelines for how soon to be seen are listed in your "After Visit Summary", but are typically between 1-4 weeks after surgery.  OTHER INSTRUCTIONS:   Knee Replacement:  Do not place pillow under knee, focus on keeping the knee straight while resting. CPM instructions: 0-90 degrees, 2 hours in the morning, 2 hours in the afternoon, and 2 hours in the evening. Place foam block, curve side up under heel at all times except when in CPM or when walking.  DO NOT modify, tear, cut, or change the foam block in any way.  MAKE SURE YOU:  Understand these instructions.  Get help right away if you are not doing well or get worse.    Thank you for letting us be a part of your medical care team.  It is a privilege we respect greatly.  We hope these instructions will help you stay on track for a fast and full recovery!   Do not put a pillow under the knee. Place it under the heel.    Complete by:  As directed    Place gray foam block, curve side up under heel at all times except when in CPM or when walking.  DO NOT modify, tear, cut, or change in any way the gray foam block.   Increase activity slowly as tolerated    Complete by:  As directed    Patient may shower    Complete by:  As directed    Aquacel  dressing is water proof    Wash over it and the whole leg with soap and water at the end of your shower   TED hose    Complete by:  As directed    Use stockings (TED hose) for 2 weeks on both leg(s).  You may remove them at night for sleeping.      Follow-up Information    Lorn Junes, MD Follow up on 04/16/2016.   Specialty:  Orthopedic Surgery Contact information: [place] [address] [phone]            Signed: Brooks Hedges 04/04/2016, 1:40 PM

## 2016-04-04 NOTE — Progress Notes (Signed)
Subjective: 2 Days Post-Op Procedure(s) (LRB): RIGHT TOTAL KNEE ARTHROPLASTY (Right) Patient reports pain as 5 on 0-10 scale.    Objective: Vital signs in last 24 hours: Temp:  [98.5 F (36.9 C)-99 F (37.2 C)] 98.5 F (36.9 C) (12/06 0434) Pulse Rate:  [50-62] 53 (12/06 0434) Resp:  [16-18] 16 (12/06 0434) BP: (139-155)/(69-83) 155/83 (12/06 0434) SpO2:  [95 %-99 %] 95 % (12/06 0434)  Intake/Output from previous day: 12/05 0701 - 12/06 0700 In: 1421.5 [P.O.:240; I.V.:475; Blood:706.5] Out: -  Intake/Output this shift: Total I/O In: 240 [P.O.:240] Out: -    Recent Labs  04/03/16 0510 04/04/16 0459  HGB 8.5* 10.2*    Recent Labs  04/03/16 0510 04/04/16 0459  WBC 8.3 10.5  RBC 3.15* 3.77*  HCT 26.4* 31.9*  PLT 231 191    Recent Labs  04/03/16 0510 04/04/16 0459  NA 137 141  K 4.5 4.2  CL 107 107  CO2 22 24  BUN 16 20  CREATININE 1.15* 1.16*  GLUCOSE 170* 139*  CALCIUM 8.8* 9.0   No results for input(s): LABPT, INR in the last 72 hours.  ABD soft Neurovascular intact Sensation intact distally Intact pulses distally Dorsiflexion/Plantar flexion intact Incision: dressing C/D/I  Assessment/Plan: 2 Days Post-Op Procedure(s) (LRB): RIGHT TOTAL KNEE ARTHROPLASTY (Right)  Principal Problem:   Primary localized osteoarthritis of right knee Active Problems:   THYROID NODULE, LEFT   Iron deficiency anemia   Glaucoma   Essential hypertension   Coronary atherosclerosis   WOLFF (WOLFE)-PARKINSON-WHITE (WPW) SYNDROME   Osteoarthritis of spine   Anxiety   GERD (gastroesophageal reflux disease)   Lumbar stenosis with neurogenic claudication   Non-insulin dependent type 2 diabetes mellitus (HCC)   CKD (chronic kidney disease), stage III   Acute blood loss as cause of postoperative anemia   Class 3 obesity due to excess calories with body mass index (BMI) of 40.0 to 44.9 in adult [facility])  Advance diet Up with therapy Plan for discharge  tomorrow Discharge to SNF  [facility] 04/04/2016, 1:35 PM

## 2016-04-04 NOTE — Progress Notes (Signed)
Physical Therapy Treatment Patient Details Name: [name] MRN: [id number] DOB: 1946-02-24 Today's Date: 04/04/2016    History of Present Illness Patient is a 70 y/o female with hx of HTN, DM, CKD, anxiety, glaucoma, wolf-parkinson-white syndrome presents s/p Rt TKA.    PT Comments    Patient continues to make gradual progression with mobility. Current plan remains appropriate.   Follow Up Recommendations  SNF     Equipment Recommendations  None recommended by PT    Recommendations for Other Services OT consult     Precautions / Restrictions Precautions Precautions: Knee Precaution Comments: Reviewed no pillow under knee and precautions Restrictions Weight Bearing Restrictions: Yes RLE Weight Bearing: Weight bearing as tolerated    Mobility  Bed Mobility Overal bed mobility: Needs Assistance Bed Mobility: Supine to Sit;Sit to Supine     Supine to sit: Min guard;HOB elevated Sit to supine: Min guard   General bed mobility comments: cues for technique; min guard for safety; use of rails  Transfers Overall transfer level: Needs assistance Equipment used: Rolling walker (2 wheeled) Transfers: Sit to/from Stand Sit to Stand: Min guard         General transfer comment: min guard from EOB; cues for hand placement  Ambulation/Gait Ambulation/Gait assistance: Min guard Ambulation Distance (Feet): 90 Feet Assistive device: Rolling walker (2 wheeled) Gait Pattern/deviations: Step-through pattern;Decreased stance time - right;Decreased step length - left;Decreased weight shift to right;Antalgic Gait velocity: decreased   General Gait Details: mildly antalgic gait; cues for posture; pt with improved step through pattern with increased time   Stairs            Wheelchair Mobility    Modified Rankin (Stroke Patients Only)       Balance                                    Cognition Arousal/Alertness: Awake/alert Behavior During  Therapy: WFL for tasks assessed/performed Overall Cognitive Status: Within Functional Limits for tasks assessed                      Exercises Total Joint Exercises Quad Sets: AROM;Right;10 reps Heel Slides: AROM;Right;10 reps Straight Leg Raises: AROM;Right;10 reps Long Arc Quad: AROM;Right;10 reps Knee Flexion: AROM;Right;5 reps;Other (comment);Seated (10 sec holds) Goniometric ROM: 75 degrees flexion in supine    General Comments        Pertinent Vitals/Pain Pain Assessment: 0-10 Pain Score: 5  Pain Location: R knee Pain Descriptors / Indicators: Aching Pain Intervention(s): Limited activity within patient's tolerance;Monitored during session;Repositioned;Patient requesting pain meds-RN notified    Home Living                      Prior Function            PT Goals (current goals can now be found in the care plan section) Acute Rehab PT Goals Patient Stated Goal: to go to rehab Progress towards PT goals: Progressing toward goals    Frequency    7X/week      PT Plan Current plan remains appropriate    Co-evaluation             End of Session Equipment Utilized During Treatment: Gait belt Activity Tolerance: Patient tolerated treatment well Patient left: in bed;in CPM;with call bell/phone within reach;with family/visitor present     Time: [id number] PT Time Calculation (min) (ACUTE ONLY): 25 min  Charges:  $Gait Training: 8-22 mins $Therapeutic Exercise: 8-22 mins                    G Codes:      [place] April, PTA Pager: [phone]   04/04/2016, 4:15 PM

## 2016-04-04 NOTE — Clinical Social Work Note (Addendum)
Clinical Social Work Assessment  Patient Details  Name: [name] MRN: [id number] Date of Birth: 1945/07/14  Date of referral:  04/04/16               Reason for consult:  Facility Placement                Permission sought to share information with:    Permission granted to share information::     Name::        Agency::     Relationship::     Contact Information:     Housing/Transportation Living arrangements for the past 2 months:  Single Family Home Source of Information:  Patient Patient Interpreter Needed:  None Criminal Activity/Legal Involvement Pertinent to Current Situation/Hospitalization:  No - Comment as needed Significant Relationships:  Adult Children Lives with:  Self Do you feel safe going back to the place where you live?  Yes Need for family participation in patient care:  Yes (Comment)  Care giving concerns:  No family or friends at bedside at this time.   Social Worker assessment / plan:  CSW spoke with pt at bedside. Pt is agreeable to SNF placement at this time. Pt had made arrangements previous to surgery to go to [facility]. Pt reports she will go by [facility]. CSW will reach out to facility. CSW will facilitate d/c needs when pt is medically stable for d/c.  Employment status:  Retired Nurse, adult PT Recommendations:  [place] / Referral to community resources:  [place]  Patient/Family's Response to care:  Pt verbalized understanding of CSW role and expressed appreciation for support. Pt denies any concern regarding pt care at this time.  Patient/Family's Understanding of and Emotional Response to Diagnosis, Current Treatment, and Prognosis:  Pt realistic and understanding of physical limitations. Pt is agreeable to SNF placement at d/c. Pt denies any question or concern regarding treatment plan at this time. CSW will continue to provide support and facilitate needs at  d/c.  Emotional Assessment Appearance:  Appears stated age Attitude/Demeanor/Rapport:   (Patient was appropriate.) Affect (typically observed):  Accepting, Appropriate, Calm Orientation:  Oriented to Self, Oriented to Place, Oriented to  Time, Oriented to Situation Alcohol / Substance use:  Not Applicable Psych involvement (Current and /or in the community):  No (Comment)  Discharge Needs  Concerns to be addressed:  No discharge needs identified Readmission within the last 30 days:  No Current discharge risk:  Dependent with Mobility Barriers to Discharge:  Continued Medical Work up   [employer], LCSW 04/04/2016, 8:10 AM

## 2016-04-05 LAB — BASIC METABOLIC PANEL
ANION GAP: 9 (ref 5–15)
BUN: 21 mg/dL — AB (ref 6–20)
CHLORIDE: 105 mmol/L (ref 101–111)
CO2: 25 mmol/L (ref 22–32)
Calcium: 8.6 mg/dL — ABNORMAL LOW (ref 8.9–10.3)
Creatinine, Ser: 1.09 mg/dL — ABNORMAL HIGH (ref 0.44–1.00)
GFR calc Af Amer: 59 mL/min — ABNORMAL LOW (ref >60)
GFR calc non Af Amer: 51 mL/min — ABNORMAL LOW (ref >60)
GLUCOSE: 99 mg/dL (ref 65–99)
POTASSIUM: 3.8 mmol/L (ref 3.5–5.1)
Sodium: 139 mmol/L (ref 135–145)

## 2016-04-05 LAB — GLUCOSE, CAPILLARY
GLUCOSE-CAPILLARY: 123 mg/dL — AB (ref 65–99)
Glucose-Capillary: 94 mg/dL (ref 65–99)

## 2016-04-05 LAB — CBC
HCT: 30.1 % — ABNORMAL LOW (ref 36.0–46.0)
Hemoglobin: 9.9 g/dL — ABNORMAL LOW (ref 12.0–15.0)
MCH: 27.8 pg (ref 26.0–34.0)
MCHC: 32.9 g/dL (ref 30.0–36.0)
MCV: 84.6 fL (ref 78.0–100.0)
Platelets: 183 K/uL (ref 150–400)
RBC: 3.56 MIL/uL — ABNORMAL LOW (ref 3.87–5.11)
RDW: 13.9 % (ref 11.5–15.5)
WBC: 9 K/uL (ref 4.0–10.5)

## 2016-04-05 NOTE — Clinical Social Work Placement (Signed)
   CLINICAL SOCIAL WORK PLACEMENT  NOTE  Date:  04/05/2016  Patient Details  Name: [name] MRN: [id number] Date of Birth: 06-05-1945  Clinical Social Work is seeking post-discharge placement for this patient at the [place] level of care (*CSW will initial, date and re-position this form in  chart as items are completed):      Patient/family provided with [place] Work Department's list of facilities offering this level of care within the geographic area requested by the patient (or if unable, by the patient's family).  Yes   Patient/family informed of their freedom to choose among providers that offer the needed level of care, that participate in Medicare, Medicaid or managed care program needed by the patient, have an available bed and are willing to accept the patient.      Patient/family informed of [facility]'s ownership interest in [facility] and [facility], as well as of the fact that they are under no obligation to receive care at these facilities.  PASRR submitted to EDS on       PASRR number received on 04/03/16     Existing PASRR number confirmed on       FL2 transmitted to all facilities in geographic area requested by pt/family on 04/03/16     FL2 transmitted to all facilities within larger geographic area on       Patient informed that his/her managed care company has contracts with or will negotiate with certain facilities, including the following:        Yes   Patient/family informed of bed offers received.  Patient chooses bed at [facility]     Physician recommends and patient chooses bed at      Patient to be transferred to [facility] on 04/05/16.  Patient to be transferred to facility by [facility]     Patient family notified on 04/05/16 of transfer.  Name of family member notified:  Patient     PHYSICIAN       Additional Comment:    _______________________________________________ Alla German, LCSW 04/05/2016, 2:21 PM

## 2016-04-05 NOTE — Clinical Social Work Note (Signed)
Clinical Social Worker facilitated patient discharge including contacting patient family and facility to confirm patient discharge plans.  Clinical information faxed to facility and family agreeable with plan.  CSW arranged ambulance transport via [facility] to [facility] .  RN to call [phone] report prior to discharge.  Clinical Social Worker will sign off for now as social work intervention is no longer needed. Please consult [place] again if new need arises.  [address] [place]

## 2016-04-05 NOTE — Progress Notes (Signed)
Orthopedic Tech Progress Note Patient Details:  [name] 09-09-1945 [id number]  CPM Right Knee CPM Right Knee: On Right Knee Flexion (Degrees): 60 Right Knee Extension (Degrees): 0 Additional Comments: Place pt on cpm at 0-60   [place] Pink 04/05/2016, 2:49 PM

## 2016-04-05 NOTE — Progress Notes (Signed)
Report called to staff at [facility].

## 2016-04-09 ENCOUNTER — Encounter: Payer: Self-pay | Admitting: Internal Medicine

## 2016-04-09 ENCOUNTER — Non-Acute Institutional Stay (SKILLED_NURSING_FACILITY): Payer: Medicare Other | Admitting: Internal Medicine

## 2016-04-09 DIAGNOSIS — R2681 Unsteadiness on feet: Secondary | ICD-10-CM | POA: Diagnosis not present

## 2016-04-09 DIAGNOSIS — K5901 Slow transit constipation: Secondary | ICD-10-CM

## 2016-04-09 DIAGNOSIS — E785 Hyperlipidemia, unspecified: Secondary | ICD-10-CM | POA: Diagnosis not present

## 2016-04-09 DIAGNOSIS — N183 Chronic kidney disease, stage 3 unspecified: Secondary | ICD-10-CM

## 2016-04-09 DIAGNOSIS — M1A30X0 Chronic gout due to renal impairment, unspecified site, without tophus (tophi): Secondary | ICD-10-CM | POA: Diagnosis not present

## 2016-04-09 DIAGNOSIS — E038 Other specified hypothyroidism: Secondary | ICD-10-CM | POA: Diagnosis not present

## 2016-04-09 DIAGNOSIS — M1711 Unilateral primary osteoarthritis, right knee: Secondary | ICD-10-CM

## 2016-04-09 DIAGNOSIS — D62 Acute posthemorrhagic anemia: Secondary | ICD-10-CM

## 2016-04-09 DIAGNOSIS — I10 Essential (primary) hypertension: Secondary | ICD-10-CM

## 2016-04-09 DIAGNOSIS — M792 Neuralgia and neuritis, unspecified: Secondary | ICD-10-CM

## 2016-04-09 NOTE — Progress Notes (Signed)
LOCATION: [facility]  PCP: Donnajean Lopes, MD   Code Status: Full Code  Goals of care: Advanced Directive information Advanced Directives 04/02/2016  Does Patient Have a Medical Advance Directive? Yes  Type of Paramedic of [place];Living will  Does patient want to make changes to medical advance directive? No - Patient declined  Copy of [place] in Chart? Yes  Would patient like information on creating a medical advance directive? -       Extended Emergency Contact Information Primary Emergency Contact: Buck Mam [place] of [place] Phone: [phone] Work Phone: [phone] Mobile Phone: [phone] Relation: Daughter Secondary Emergency Contact: Shon Millet [place] of [employer] Phone: [phone] Relation: Daughter   Allergies  Allergen Reactions  . Contrast Media [Iodinated Diagnostic Agents] Shortness Of Breath and Other (See Comments)    bp elevated  . Adhesive [Tape] Other (See Comments)    Pulls skin off  . Esomeprazole Magnesium Hives  . Morphine And Related Other (See Comments)    Severe headache  chills  . Talwin [Pentazocine] Other (See Comments)    hallucinations  . Allopurinol Diarrhea  . Codeine Nausea And Vomiting  . Colchicine Diarrhea  . Hydrocodone Nausea And Vomiting  . Meperidine Hcl Nausea And Vomiting  . Oxycodone Nausea And Vomiting  . Pentazocine Lactate Nausea And Vomiting    Chief Complaint  Patient presents with  . New Admit To SNF    New Admission Visit     HPI:  Patient is a 70 y.o. female seen today for short term rehabilitation post hospital admission from 04/02/16-04/05/16 with right knee OA. She underwent right total knee arthroplasty. She is seen in her room today.   Review of Systems:  Constitutional: Negative for fever, chills, diaphoresis.  HENT: Negative for congestion, nasal discharge, sore throat, difficulty swallowing. Positive for  occasional headache.  Eyes: Negative for double vision and discharge. Wears glasses. Respiratory: Negative for cough, shortness of breath and wheezing.   Cardiovascular: Negative for chest pain, palpitations, leg swelling.  Gastrointestinal: Negative for heartburn, nausea, vomiting, abdominal pain. Last bowel movement was 2 days back.   Genitourinary: Negative for dysuria.  Musculoskeletal: Negative for back pain, fall in the facility.  Skin: Negative for itching, rash.  Neurological: Negative for dizziness. Psychiatric/Behavioral: Negative for depression.   Past Medical History:  Diagnosis Date  . Acute blood loss as cause of postoperative anemia 04/03/2016  . Anemia    hx of  . Anxiety    takes Xanax daily as needed  . Arthritis   . Bronchitis    a month ago  . Chronic back pain    stenosis  . Chronic kidney disease    stage 3-sees Dr.J Patel  . Class 3 obesity due to excess calories with body mass index (BMI) of 40.0 to 44.9 in adult [facility])   . Diabetes mellitus    type II; takes Byetta and Invokana daily  . Glaucoma    uses Eye Drops  . Gout    takes Uloric daily  . History of blood transfusion    no abnormal reaction   . History of colon polyps   . Hypertension    takes HCTZ daily as well as Aldactone  . Hypothyroidism    takes Synthroid daily  . Pneumonia    many yrs ago  . PONV (postoperative nausea and vomiting)   . Primary localized osteoarthritis of right knee   . Stress incontinence   .  Syncope   . Weakness    numbness and tingling in right hip/eg/  . Wears glasses   . Wolf-Parkinson-White syndrome    takes Atenolol daily   Past Surgical History:  Procedure Laterality Date  . ABDOMINAL HYSTERECTOMY    . ANKLE ARTHROPLASTY  2010   lt - pt states not arthroplasty but arthroscopy  . APPENDECTOMY    . BACK SURGERY  2006   lumb fusion  . BACK SURGERY  08/16/2014   L3-4 fusion  . BREAST BIOPSY    . BREAST ENHANCEMENT SURGERY Bilateral   . CARDIAC  CATHETERIZATION  >1yrs ago  . CARPAL TUNNEL RELEASE Bilateral    x4  . cataract surgery Bilateral   . CESAREAN SECTION     x 3  . CHOLECYSTECTOMY  1981  . COLONOSCOPY    . KNEE ARTHROSCOPY  2009   rt  . RAZ procedure  10+yrs ago  . ROTATOR CUFF REPAIR Right    x 4  . SHOULDER ARTHROSCOPY WITH ROTATOR CUFF REPAIR AND SUBACROMIAL DECOMPRESSION Right 06/10/2012   Procedure: SHOULDER ARTHROSCOPY WITH ROTATOR CUFF REPAIR AND SUBACROMIAL DECOMPRESSION;  Surgeon: Lorn Junes, MD;  Location: [facility];  Service: Orthopedics;  Laterality: Right;  RIGHT SHOULDER ARTHROSCOPY, DECOMPRESSION SUBACROMIAL PARTIAL ACROMIOPLASTY WITH CORACOACROMIAL RELEASE,, DISTAL CLAVICULECTOMY , ROTATOR CUFF debriedment  . SKIN BIOPSY    . THYROID SURGERY    . TOTAL KNEE ARTHROPLASTY Right 04/02/2016   Procedure: RIGHT TOTAL KNEE ARTHROPLASTY;  Surgeon: Elsie Saas, MD;  Location: [facility];  Service: Orthopedics;  Laterality: Right;   Social History:   reports that she has quit smoking. She has never used smokeless tobacco. She reports that she does not drink alcohol or use drugs.  Family History  Problem Relation Age of Onset  . Diabetes Mother   . Heart attack Father   . Hypertension    . Arthritis      Medications:   Medication List       Accurate as of 04/09/16  1:11 PM. Always use your most recent med list.          acetaminophen 325 MG tablet Commonly known as:  TYLENOL Take 2 tablets (650 mg total) by mouth every 6 (six) hours as needed for mild pain (or Fever >/= 101).   ALPRAZolam 0.5 MG tablet Commonly known as:  XANAX Take 0.5 mg by mouth 3 (three) times daily as needed for anxiety.   apixaban 2.5 MG Tabs tablet Commonly known as:  ELIQUIS Take 1 tablet (2.5 mg total) by mouth every 12 (twelve) hours.   atenolol 50 MG tablet Commonly known as:  TENORMIN Take 50 mg by mouth daily.   docusate sodium 100 MG capsule Commonly known as:  COLACE 1 tab 2 times a day  while on narcotics.  STOOL SOFTENER   febuxostat 40 MG tablet Commonly known as:  ULORIC Take 40 mg by mouth daily.   gabapentin 300 MG capsule Commonly known as:  NEURONTIN Take 300 mg by mouth at bedtime.   HYDROmorphone 2 MG tablet Commonly known as:  DILAUDID Take 2-4 mg by mouth every 4 (four) hours as needed for moderate pain or severe pain.   latanoprost 0.005 % ophthalmic solution Commonly known as:  XALATAN Place 1 drop into both eyes at bedtime. Reported on 10/21/2015   levothyroxine 50 MCG tablet Commonly known as:  SYNTHROID, LEVOTHROID Take 50-100 mcg by mouth daily. Take 50 mcg daily, except 100 mcg by mouth on  Sunday   polyethylene glycol packet Commonly known as:  MIRALAX / GLYCOLAX Take 17 g by mouth 2 (two) times daily.   pravastatin 20 MG tablet Commonly known as:  PRAVACHOL Take 20 mg by mouth daily.   TRULICITY [id number] Sopn Generic drug:  Dulaglutide Inject 0.5 mLs into the skin every 7 (seven) days. Wednesdays       Immunizations: Immunization History  Administered Date(s) Administered  . PPD Test 04/04/2016     Physical Exam: Vitals:   04/09/16 1305  BP: 137/76  Pulse: 63  Resp: 16  Temp: 98.6 F (37 C)  TempSrc: Oral  SpO2: 97%  Weight: 227 lb (103 kg)  Height: 5\' 3"  (1.6 m)   Body mass index is 40.21 kg/m.  General- elderly female, morbidly obese, in no acute distress Head- normocephalic, atraumatic Nose- no maxillary or frontal sinus tenderness, no nasal discharge Throat- moist mucus membrane  Eyes- PERRLA, EOMI, no pallor, no icterus Neck- no cervical lymphadenopathy Cardiovascular- normal s1,s2, no murmur Respiratory- bilateral clear to auscultation, no wheeze, no rhonchi, no crackles, no use of accessory muscles Abdomen- bowel sounds present, soft, non tender Musculoskeletal- able to move all 4 extremities, limited right knee ROM, trace right leg edema Neurological- alert and oriented to person, place and  time Skin- warm and dry, right knee surgical incision with aquacel dressing in place Psychiatry- normal mood and affect    Labs reviewed: Basic Metabolic Panel:  Recent Labs  11/25/15 1007  04/03/16 0510 04/04/16 0459 04/05/16 0438  NA 141  < > 137 141 139  K 4.4  < > 4.5 4.2 3.8  CL 105  < > 107 107 105  CO2 24  < > 22 24 25   GLUCOSE 91  < > 170* 139* 99  BUN 28*  < > 16 20 21*  CREATININE 1.37*  < > 1.15* 1.16* 1.09*  CALCIUM 9.6  < > 8.8* 9.0 8.6*  MG 1.9  --   --   --   --   < > = values in this interval not displayed. Liver Function Tests:  Recent Labs  10/21/15 1851 03/23/16 0941  AST 19 17  ALT 19 14  ALKPHOS 109 82  BILITOT <0.1* 0.3  PROT 7.6 7.4  ALBUMIN 3.8 3.7   No results for input(s): LIPASE, AMYLASE in the last 8760 hours. No results for input(s): AMMONIA in the last 8760 hours. CBC:  Recent Labs  10/21/15 1851  03/23/16 0941 04/03/16 0510 04/04/16 0459 04/05/16 0438  WBC 6.2  < > 4.7 8.3 10.5 9.0  NEUTROABS 3.1  --  2.6  --   --   --   HGB 10.1*  < > 10.9* 8.5* 10.2* 9.9*  HCT 32.5*  < > 34.4* 26.4* 31.9* 30.1*  MCV 85.5  < > 84.9 83.8 84.6 84.6  PLT 223  < > 265 231 191 183  < > = values in this interval not displayed. Cardiac Enzymes:  Recent Labs  10/21/15 1851 10/22/15 0109  TROPONINI <0.03 <0.03   BNP: Invalid input(s): POCBNP CBG:  Recent Labs  04/04/16 2110 04/05/16 0621 04/05/16 1131  GLUCAP 145* 94 123*    Radiological Exams: No results found.  Assessment/Plan  Unsteady gait With right knee arthroplasty. Will have patient work with PT/OT as tolerated to regain strength and restore function.  Fall precautions are in place.  Right knee OA S/p right total knee arthroplasty. Has orthopedic follow up. Continue dilaudid 2 mg 1-2 tab  q6h prn pain. Add ted hose to both legs. Will have her work with physical therapy and occupational therapy team to help with gait training and muscle strengthening exercises.fall  precautions. Skin care. Encourage to be out of bed. Continue eliquis 2.5 mg bid for DVT prophylaxis for total of 12 days..   Blood loss anemia Post op, monitor cbc  ckd stage 3 Monitor bmp  Constipation On miralax bid and colace 100 mg bid. Discontinue colace and start senna s 2 tab qhs and monitor. Hydration encouraged.   HTN Stable, monitor BP. Continue atenolol 50 mg daily  Gout Stable. Continue uloric daily  Neuropathic pain Continue gabapentin 300 mg daily and monitor  Hypothyroidism Continue levothyroxine  HLD Continue pravastatin and monitor   Goals of care: short term rehabilitation   Labs/tests ordered: cbc, cmp 04/10/16  Family/ staff Communication: reviewed care plan with patient and nursing supervisor    Blanchie Serve, MD Internal Medicine [place], [place] [address] Cell Phone (Monday-Friday 8 am - 5 pm): [phone] On Call: [phone] and follow prompts after 5 pm and on weekends Office Phone: [phone] Office Fax: [phone]

## 2016-04-16 ENCOUNTER — Non-Acute Institutional Stay (SKILLED_NURSING_FACILITY): Payer: Medicare Other | Admitting: Family

## 2016-04-16 DIAGNOSIS — G629 Polyneuropathy, unspecified: Secondary | ICD-10-CM

## 2016-04-16 DIAGNOSIS — N183 Chronic kidney disease, stage 3 unspecified: Secondary | ICD-10-CM

## 2016-04-16 DIAGNOSIS — E782 Mixed hyperlipidemia: Secondary | ICD-10-CM | POA: Diagnosis not present

## 2016-04-16 DIAGNOSIS — F419 Anxiety disorder, unspecified: Secondary | ICD-10-CM | POA: Diagnosis not present

## 2016-04-16 DIAGNOSIS — I10 Essential (primary) hypertension: Secondary | ICD-10-CM | POA: Diagnosis not present

## 2016-04-16 DIAGNOSIS — Z96651 Presence of right artificial knee joint: Secondary | ICD-10-CM | POA: Diagnosis not present

## 2016-04-16 DIAGNOSIS — E039 Hypothyroidism, unspecified: Secondary | ICD-10-CM

## 2016-04-16 DIAGNOSIS — R2681 Unsteadiness on feet: Secondary | ICD-10-CM | POA: Diagnosis not present

## 2016-04-16 NOTE — Progress Notes (Signed)
Location:  [facility] Room Number: [id number]  Place of Service:  SNF (78)  Provider:Coury Grieger FNP-C   PCP: Donnajean Lopes, MD Patient Care Team: Leanna Battles, MD as PCP - General (Internal Medicine)  Extended Emergency Contact Information Primary Emergency Contact: [place] Phone: [phone] Work Phone: [phone] Mobile Phone: [phone] Relation: Daughter Secondary Emergency Contact: [place] Mobile Phone: [phone] Relation: Daughter  Code Status: Full Code  Goals of care:  Advanced Directive information Advanced Directives 04/02/2016  Does Patient Have a Medical Advance Directive? Yes  Type of Paramedic of [place];Living will  Does patient want to make changes to medical advance directive? No - Patient declined  Copy of [place] in Chart? Yes  Would patient like information on creating a medical advance directive? -     Allergies  Allergen Reactions  . Contrast Media [Iodinated Diagnostic Agents] Shortness Of Breath and Other (See Comments)    bp elevated  . Adhesive [Tape] Other (See Comments)    Pulls skin off  . Esomeprazole Magnesium Hives  . Morphine And Related Other (See Comments)    Severe headache  chills  . Talwin [Pentazocine] Other (See Comments)    hallucinations  . Allopurinol Diarrhea  . Codeine Nausea And Vomiting  . Colchicine Diarrhea  . Hydrocodone Nausea And Vomiting  . Meperidine Hcl Nausea And Vomiting  . Oxycodone Nausea And Vomiting  . Pentazocine Lactate Nausea And Vomiting    Chief Complaint  Patient presents with  . Discharge Note    Discharge from SNF     HPI:  70 y.o. female  [facility] for discharge home. She was here for short term rehabilitation post hospital admission from 04/02/16-04/05/16 with right knee OA.She underwent right total knee  arthroplasty. She has a medical history of HTN,Hypothyroidism, OA, Type 2 DM, CKD stage 3, Anxiety among other conditions. She is seen in his room today. She denies any acute issues this visit. She has worked well with PT/OT now stable for discharge home.She will be discharged home with outpatient therapy with [facility] office to continue with ROM, Exercise, Gait stability and muscle strengthening. She does not require any DME. Home health services will be arranged by facility social worker prior to discharge. Prescription medication will be written x 1 month then patient to follow up with PCP in 1-2 weeks. Facility staff report no new concerns.      Past Medical History:  Diagnosis Date  . Acute blood loss as cause of postoperative anemia 04/03/2016  . Anemia    hx of  . Anxiety    takes Xanax daily as needed  . Arthritis   . Bronchitis    a month ago  . Chronic back pain    stenosis  . Chronic kidney disease    stage 3-sees Dr.J Patel  . Class 3 obesity due to excess calories with body mass index (BMI) of 40.0 to 44.9 in adult [facility])   . Diabetes mellitus    type II; takes Byetta and Invokana daily  . Glaucoma    uses Eye Drops  . Gout    takes Uloric daily  . History of blood transfusion    no abnormal reaction   . History of colon polyps   . Hypertension    takes HCTZ daily as well as Aldactone  . Hypothyroidism    takes Synthroid daily  .  Pneumonia    many yrs ago  . PONV (postoperative nausea and vomiting)   . Primary localized osteoarthritis of right knee   . Stress incontinence   . Syncope   . Weakness    numbness and tingling in right hip/eg/  . Wears glasses   . Wolf-Parkinson-White syndrome    takes Atenolol daily    Past Surgical History:  Procedure Laterality Date  . ABDOMINAL HYSTERECTOMY    . ANKLE ARTHROPLASTY  2010   lt - pt states not arthroplasty but arthroscopy  . APPENDECTOMY    . BACK SURGERY  2006   lumb fusion  . BACK SURGERY  08/16/2014     L3-4 fusion  . BREAST BIOPSY    . BREAST ENHANCEMENT SURGERY Bilateral   . CARDIAC CATHETERIZATION  >66yrs ago  . CARPAL TUNNEL RELEASE Bilateral    x4  . cataract surgery Bilateral   . CESAREAN SECTION     x 3  . CHOLECYSTECTOMY  1981  . COLONOSCOPY    . KNEE ARTHROSCOPY  2009   rt  . RAZ procedure  10+yrs ago  . ROTATOR CUFF REPAIR Right    x 4  . SHOULDER ARTHROSCOPY WITH ROTATOR CUFF REPAIR AND SUBACROMIAL DECOMPRESSION Right 06/10/2012   Procedure: SHOULDER ARTHROSCOPY WITH ROTATOR CUFF REPAIR AND SUBACROMIAL DECOMPRESSION;  Surgeon: Lorn Junes, MD;  Location: [facility];  Service: Orthopedics;  Laterality: Right;  RIGHT SHOULDER ARTHROSCOPY, DECOMPRESSION SUBACROMIAL PARTIAL ACROMIOPLASTY WITH CORACOACROMIAL RELEASE,, DISTAL CLAVICULECTOMY , ROTATOR CUFF debriedment  . SKIN BIOPSY    . THYROID SURGERY    . TOTAL KNEE ARTHROPLASTY Right 04/02/2016   Procedure: RIGHT TOTAL KNEE ARTHROPLASTY;  Surgeon: Elsie Saas, MD;  Location: [facility];  Service: Orthopedics;  Laterality: Right;      reports that she has quit smoking. She has never used smokeless tobacco. She reports that she does not drink alcohol or use drugs. Social History   Social History  . Marital status: Single    Spouse name: N/A  . Number of children: N/A  . Years of education: N/A   Occupational History  . Not on file.   Social History Main Topics  . Smoking status: Former Research scientist (life sciences)  . Smokeless tobacco: Never Used     Comment: quit smoking 38yrs ago  . Alcohol use No  . Drug use: No  . Sexual activity: Not on file   Other Topics Concern  . Not on file   Social History Narrative  . No narrative on file   Functional Status Survey:    Allergies  Allergen Reactions  . Contrast Media [Iodinated Diagnostic Agents] Shortness Of Breath and Other (See Comments)    bp elevated  . Adhesive [Tape] Other (See Comments)    Pulls skin off  . Esomeprazole Magnesium Hives  . Morphine And  Related Other (See Comments)    Severe headache  chills  . Talwin [Pentazocine] Other (See Comments)    hallucinations  . Allopurinol Diarrhea  . Codeine Nausea And Vomiting  . Colchicine Diarrhea  . Hydrocodone Nausea And Vomiting  . Meperidine Hcl Nausea And Vomiting  . Oxycodone Nausea And Vomiting  . Pentazocine Lactate Nausea And Vomiting    Pertinent  Health Maintenance Due  Topic Date Due  . FOOT EXAM  12/29/1955  . OPHTHALMOLOGY EXAM  [birth year]29/1955  . DEXA SCAN  12/30/2011  . PNA vac Low Risk Adult (1 of 2 - PCV13) 12/30/2011  . URINE MICROALBUMIN  08/25/2014  .  INFLUENZA VACCINE  11/29/2015  . HEMOGLOBIN A1C  09/20/2016  . MAMMOGRAM  10/30/2017  . COLONOSCOPY  04/06/2019    Medications: Allergies as of 04/16/2016      Reactions   Contrast Media [iodinated Diagnostic Agents] Shortness Of Breath, Other (See Comments)   bp elevated   Adhesive [tape] Other (See Comments)   Pulls skin off   Esomeprazole Magnesium Hives   Morphine And Related Other (See Comments)   Severe headache  chills   Talwin [pentazocine] Other (See Comments)   hallucinations   Allopurinol Diarrhea   Codeine Nausea And Vomiting   Colchicine Diarrhea   Hydrocodone Nausea And Vomiting   Meperidine Hcl Nausea And Vomiting   Oxycodone Nausea And Vomiting   Pentazocine Lactate Nausea And Vomiting      Medication List       Accurate as of 04/16/16  6:13 PM. Always use your most recent med list.          acetaminophen 325 MG tablet Commonly known as:  TYLENOL Take 2 tablets (650 mg total) by mouth every 6 (six) hours as needed for mild pain (or Fever >/= 101).   ALPRAZolam 0.5 MG tablet Commonly known as:  XANAX Take 0.5 mg by mouth 3 (three) times daily as needed for anxiety.   apixaban 2.5 MG Tabs tablet Commonly known as:  ELIQUIS Take 1 tablet (2.5 mg total) by mouth every 12 (twelve) hours.   atenolol 50 MG tablet Commonly known as:  TENORMIN Take 50 mg by mouth daily.     docusate sodium 100 MG capsule Commonly known as:  COLACE 1 tab 2 times a day while on narcotics.  STOOL SOFTENER   febuxostat 40 MG tablet Commonly known as:  ULORIC Take 40 mg by mouth daily.   gabapentin 300 MG capsule Commonly known as:  NEURONTIN Take 300 mg by mouth at bedtime.   HYDROmorphone 2 MG tablet Commonly known as:  DILAUDID Take 2-4 mg by mouth every 4 (four) hours as needed for moderate pain or severe pain.   latanoprost 0.005 % ophthalmic solution Commonly known as:  XALATAN Place 1 drop into both eyes at bedtime. Reported on 10/21/2015   levothyroxine 50 MCG tablet Commonly known as:  SYNTHROID, LEVOTHROID Take 50-100 mcg by mouth daily. Take 50 mcg daily, except 100 mcg by mouth on Sunday   polyethylene glycol packet Commonly known as:  MIRALAX / GLYCOLAX Take 17 g by mouth 2 (two) times daily.   pravastatin 20 MG tablet Commonly known as:  PRAVACHOL Take 20 mg by mouth daily.   TRULICITY [id number] Sopn Generic drug:  Dulaglutide Inject 0.5 mLs into the skin every 7 (seven) days. Wednesdays       Review of Systems  Constitutional: Negative for activity change, appetite change, chills, fatigue and fever.  HENT: Negative for congestion, rhinorrhea, sinus pain, sinus pressure, sneezing and sore throat.   Eyes: Negative.   Respiratory: Negative for cough, chest tightness, shortness of breath and wheezing.   Cardiovascular: Negative for chest pain, palpitations and leg swelling.  Gastrointestinal: Negative for abdominal distention, abdominal pain, constipation, diarrhea, nausea and vomiting.  Endocrine: Negative.   Genitourinary: Negative for dysuria, flank pain, frequency and urgency.  Musculoskeletal: Positive for gait problem.       Right knee surgical incision  Skin: Negative for color change, pallor and rash.  Neurological: Negative for dizziness, seizures, syncope and headaches.  Hematological: Does not bruise/bleed easily.   Psychiatric/Behavioral: Negative for agitation, confusion,  hallucinations and sleep disturbance. The patient is not nervous/anxious.     Vitals:   04/16/16 1115  BP: (!) 142/64  Pulse: 76  Resp: 18  Temp: 98.3 F (36.8 C)  SpO2: 98%  Weight: 227 lb (103 kg)  Height: 5\' 3"  (1.6 m)   Body mass index is 40.21 kg/m. Physical Exam  Constitutional: She is oriented to person, place, and time. She appears well-developed and well-nourished. No distress.  HENT:  Head: Normocephalic.  Mouth/Throat: Oropharynx is clear and moist. No oropharyngeal exudate.  Eyes: Conjunctivae and EOM are normal. Pupils are equal, round, and reactive to light. Right eye exhibits no discharge. Left eye exhibits no discharge. No scleral icterus.  Neck: Normal range of motion. No JVD present. No thyromegaly present.  Cardiovascular: Normal rate, regular rhythm and intact distal pulses.  Exam reveals no gallop and no friction rub.   No murmur heard. Pulmonary/Chest: Effort normal and breath sounds normal. No respiratory distress. She has no wheezes. She has no rales.  Abdominal: Soft. Bowel sounds are normal. She exhibits no distension. There is no tenderness. There is no rebound and no guarding.  Musculoskeletal: She exhibits no edema, tenderness or deformity.  Unsteady gait. Right knee limited ROM due to pain.   Lymphadenopathy:    She has no cervical adenopathy.  Neurological: She is oriented to person, place, and time.  Skin: Skin is warm and dry. No rash noted. She is not diaphoretic. No erythema. No pallor.  Psychiatric: She has a normal mood and affect.    Labs reviewed: Basic Metabolic Panel:  Recent Labs  11/25/15 1007  04/03/16 0510 04/04/16 0459 04/05/16 0438  NA 141  < > 137 141 139  K 4.4  < > 4.5 4.2 3.8  CL 105  < > 107 107 105  CO2 24  < > 22 24 25   GLUCOSE 91  < > 170* 139* 99  BUN 28*  < > 16 20 21*  CREATININE 1.37*  < > 1.15* 1.16* 1.09*  CALCIUM 9.6  < > 8.8* 9.0 8.6*  MG 1.9   --   --   --   --   < > = values in this interval not displayed. Liver Function Tests:  Recent Labs  10/21/15 1851 03/23/16 0941  AST 19 17  ALT 19 14  ALKPHOS 109 82  BILITOT <0.1* 0.3  PROT 7.6 7.4  ALBUMIN 3.8 3.7   CBC:  Recent Labs  10/21/15 1851  03/23/16 0941 04/03/16 0510 04/04/16 0459 04/05/16 0438  WBC 6.2  < > 4.7 8.3 10.5 9.0  NEUTROABS 3.1  --  2.6  --   --   --   HGB 10.1*  < > 10.9* 8.5* 10.2* 9.9*  HCT 32.5*  < > 34.4* 26.4* 31.9* 30.1*  MCV 85.5  < > 84.9 83.8 84.6 84.6  PLT 223  < > 265 231 191 183  < > = values in this interval not displayed. Cardiac Enzymes:  Recent Labs  10/21/15 1851 10/22/15 0109  TROPONINI <0.03 <0.03   CBG:  Recent Labs  04/04/16 2110 04/05/16 0621 04/05/16 1131  GLUCAP 145* 94 123*    Assessment/Plan:   1. Essential hypertension B/p stable. Continue to monitor.   2. Hypothyroidism, unspecified type Continue on Levothyroxine. Monitor TSH level.   3. CKD (chronic kidney disease), stage III Monitor BMP.   4. Mixed hyperlipidemia Continue on pravastatin. Lipid panel with PCP  5. Neuropathy ([place]) Continue on gabapentin .  6. Unsteady gait  has worked well with PT/OT now stable for discharge home.She will be discharged home with outpatient therapy with [facility] office to continue with ROM, Exercise, Gait stability and muscle strengthening. She does not require any DME.Fall and safety precautions.   7. Status post total right knee replacement  has worked well with PT/OT now stable for discharge home.She will be discharged home with outpatient therapy with [facility] office to continue with ROM, Exercise, Gait stability and muscle strengthening.continue current pain regimen. Wean off pain meds as tolerated. Continue to follow up with Ortho.   8. Anxiety Continue on Xanax.   Patient is being discharged with the following home health services:    - Outpatient Therapy with Dr. Raliegh Ip office.    Patient is being discharged with the following durable medical equipment:    -No DME required has own FWW at home.   Patient has been advised to f/u with their PCP in 1-2 weeks to for a transitions of care visit.  Social services at their facility was responsible for arranging this appointment.  Pt was provided with adequate prescriptions of noncontrolled medications to reach the scheduled appointment .  For controlled substances, a limited supply was provided as appropriate for the individual patient.  If the pt normally receives these medications from a pain clinic or has a contract with another physician, these medications should be received from that clinic or physician only).    Future labs/tests needed:  CBC, BMP in 1-2 weeks with PCP

## 2016-04-18 MED FILL — HYDROmorphone HCL 2 MG TABS: 2 | 5 days supply | Qty: 30 | Fill #0

## 2016-04-19 MED FILL — ELIQUIS 2.5 MG TABLET: 2.5 | 30 days supply | Qty: 60 | Fill #0

## 2016-05-01 MED FILL — HYDROmorphone HCL 2 MG TABS: 2 | 10 days supply | Qty: 40 | Fill #0

## 2016-05-22 MED FILL — HYDROmorphone HCL 2 MG TABS: 2 | 15 days supply | Qty: 30 | Fill #0

## 2016-08-23 IMAGING — MR MR LUMBAR SPINE WO/W CM
4 of 8 series · 18 of 48 positions shown · IV contrast (10ml Multihance)
Comparison: MRI lumbar spine 05/20/2008. Plain radiographs
06/16/2014 from ordering provider's office.

CLINICAL DATA: Low back and RIGHT leg pain for 3 months. No injury.
Previous lumbar fusion 6226.

EXAM:
MRI LUMBAR SPINE WITHOUT AND WITH CONTRAST
TECHNIQUE: Multiplanar and multiecho pulse sequences of the lumbar spine were
obtained without and with intravenous contrast.
CONTRAST:  10mL MULTIHANCE GADOBENATE DIMEGLUMINE 529 MG/ML IV SOLN

[Series 3: T1 · sagittal · 4.0mm · 0.84mm/px · 3 of 14 slices shown (1 of 2)]
[im 1/14]
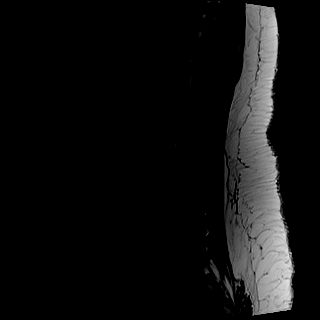
[im 7/14]
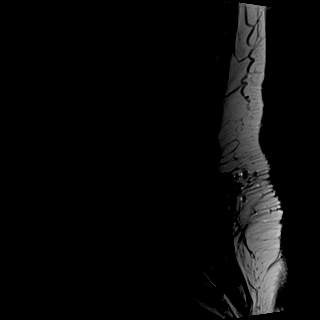
[im 14/14]
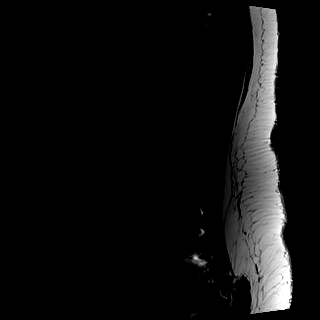

[Series 4: T2 · sagittal · 4.0mm · 0.42mm/px · 4 of 14 slices shown (1 of 2)]
[im 1/14]
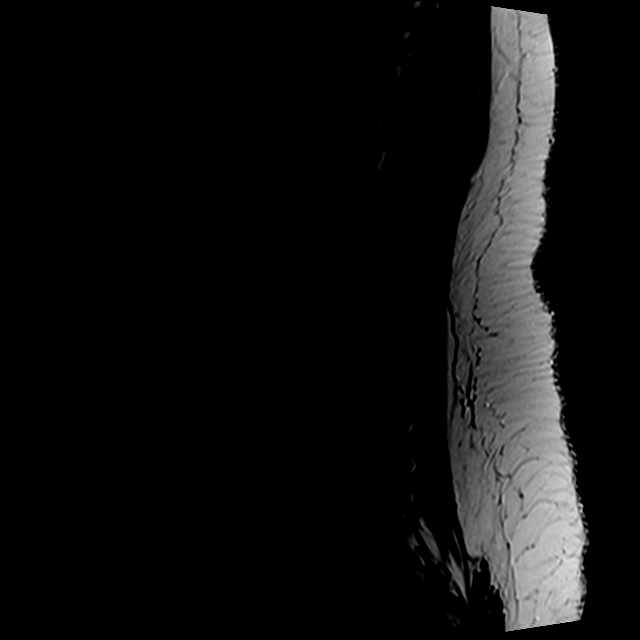
[im 5/14]
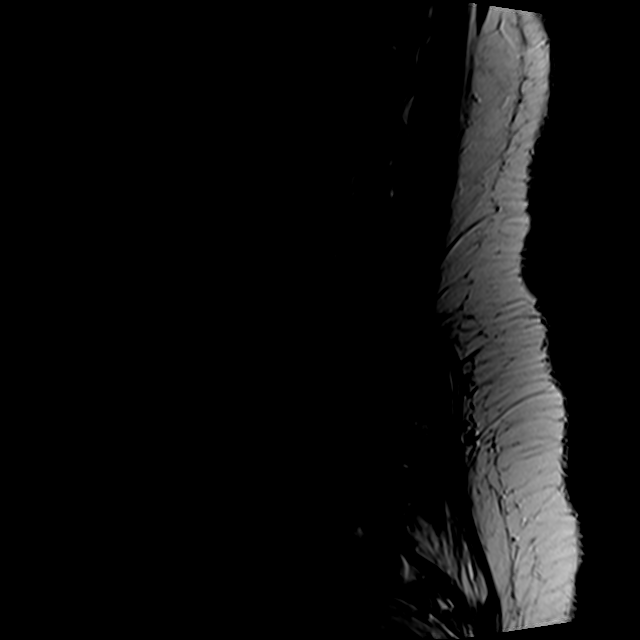
[im 9/14]
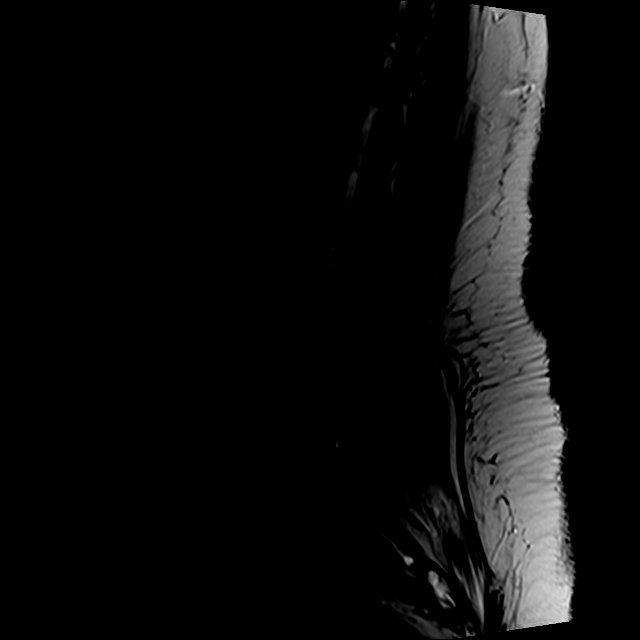
[im 14/14]
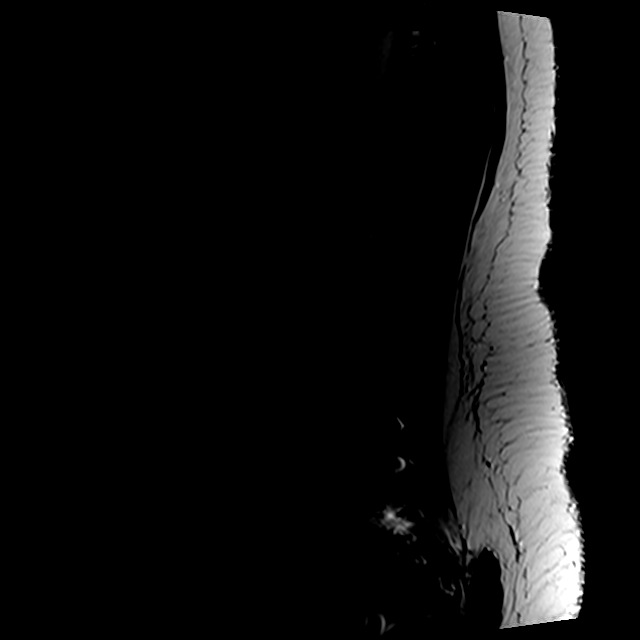

[Series 5: T2 · axial · 4.0mm · 0.37mm/px · z∈[-63,+95]mm · 8 of 34 slices shown (2 of 2)]
[im 1/34]
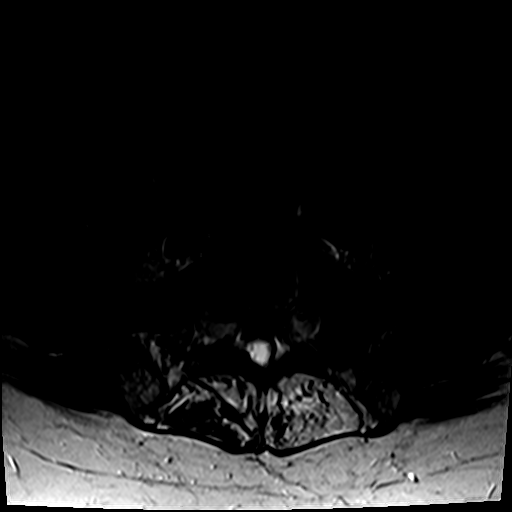
[im 5/34]
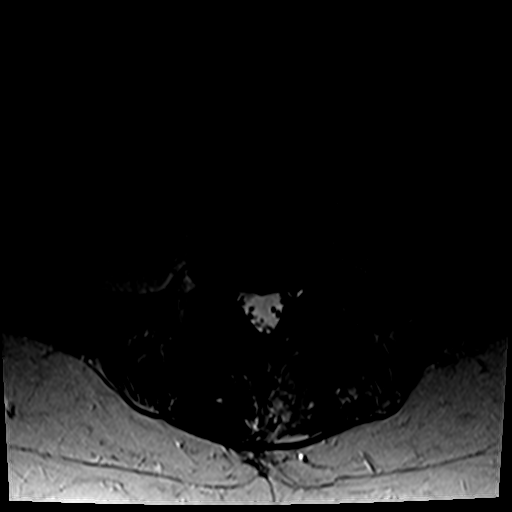
[im 9/34]
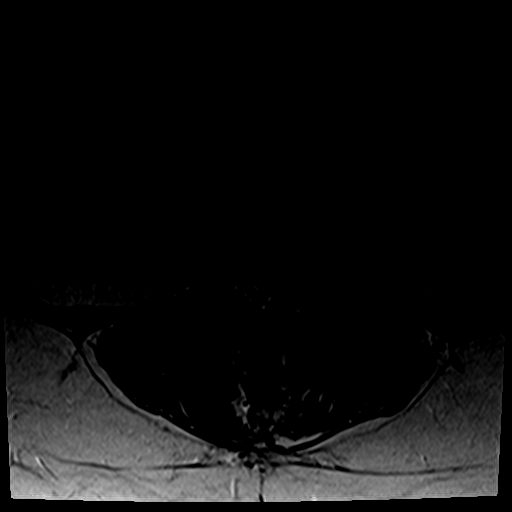
[im 13/34]
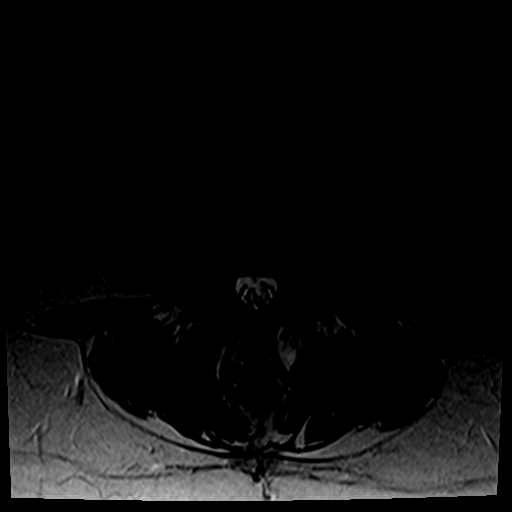
[im 17/34]
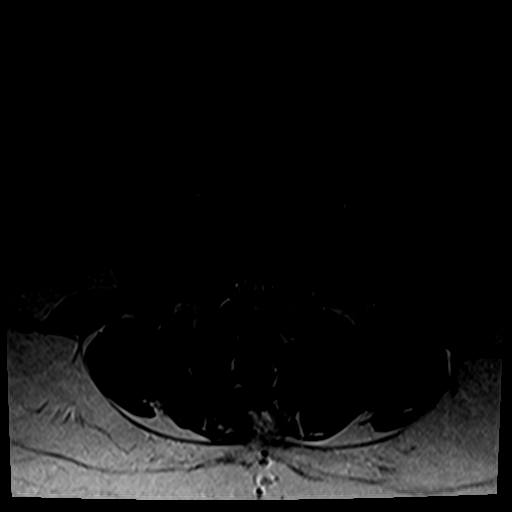
[im 21/34]
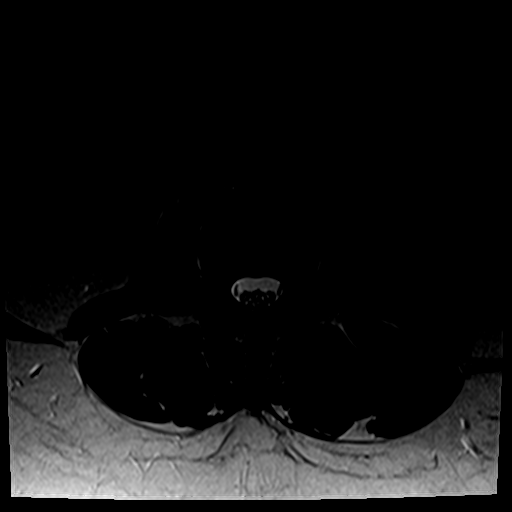
[im 25/34]
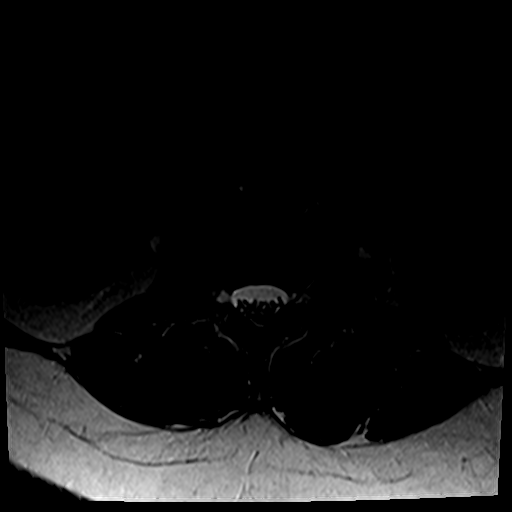
[im 29/34]
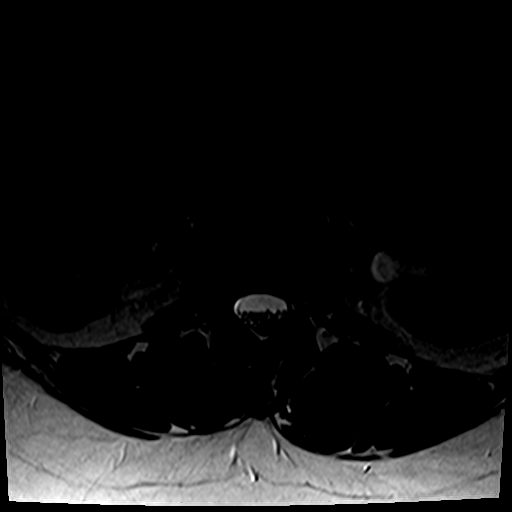

[Series 7: T1 · axial · 4.0mm · 0.37mm/px · z∈[-43,+95]mm · 3 of 34 slices shown (2 of 2)]
[im 5/34]
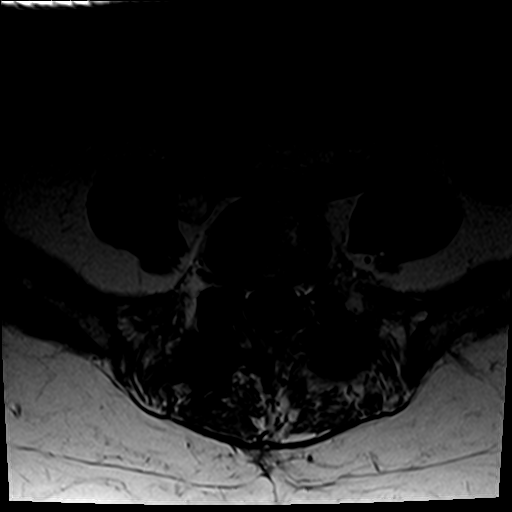
[im 17/34]
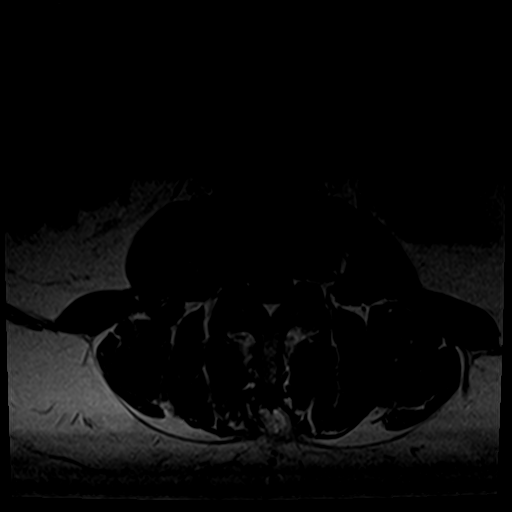
[im 29/34]
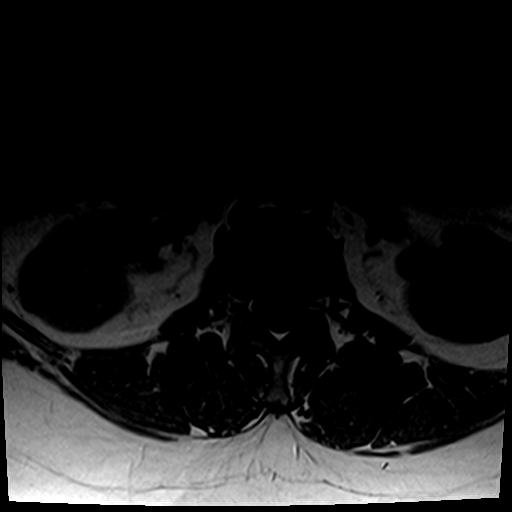

[18 of 48 positions shown; findings below may reference images not displayed]

FINDINGS: The patient has undergone posterior fusion at L5-S1. There is no
interbody cage placed at this level. There is no definite residual
impingement, although visualization is difficult due to
susceptibility. No abnormal postcontrast enhancement this level or
elsewhere.

Unremarkable appearing L1-2 and L2-3 disc spaces. Mild annular
bulging at L3-4 with a foraminal and extraforaminal protrusion on
the RIGHT. RIGHT L3 nerve root impingement is likely. No significant
canal stenosis or subarticular zone narrowing.

Adjacent segment disease has developed at L4-5, with a broad-based
central protrusion, posterior element hypertrophy involving facet
arthropathy and ligamentum flavum. There is mild anterolisthesis
with the patient recumbent for CT, measuring 2 mm, which increases
significantly when the patient is upright standing in flexion) 7 mm)
based on the appearance of radiographs from [REDACTED]. Superimposed
extraforaminal protrusion far laterally on the RIGHT. This has
increased from 7959. RIGHT greater than LEFT L4 and L5 nerve root
impingement are present at this level. In addition the stenosis is
worse.

No osseous lesion. No paravertebral masses. No postoperative fluid
collections. Increased body habitus.
IMPRESSION: Progressive stenosis and dynamic instability at L4-5, representing
adjacent segment disease, since the prior MR exam of [DATE] mm slip
with patient recumbent increases to 7 mm standing flexion on outside
films. Central protrusion, posterior element hypertrophy, and
rightward extraforaminal protrusion affect the RIGHT greater than
LEFT L4 and L5 nerve roots.

Extraforaminal protrusion at L3-4 on the RIGHT likely affects the L3
nerve root and could contribute to RIGHT leg pain.

No worrisome features status post L5-S1 posterior fusion.

## 2016-08-23 MED FILL — AZITHROMYCIN 250 MG TABLET: 250 | 5 days supply | Qty: 6 | Fill #0

## 2016-10-30 ENCOUNTER — Other Ambulatory Visit: Payer: Self-pay | Admitting: Internal Medicine

## 2016-10-30 DIAGNOSIS — Z1231 Encounter for screening mammogram for malignant neoplasm of breast: Secondary | ICD-10-CM

## 2016-11-06 ENCOUNTER — Encounter: Payer: Self-pay | Admitting: Podiatry

## 2016-11-06 ENCOUNTER — Ambulatory Visit (INDEPENDENT_AMBULATORY_CARE_PROVIDER_SITE_OTHER): Payer: Medicare Other | Admitting: Podiatry

## 2016-11-06 VITALS — BP 143/98 | HR 70

## 2016-11-06 DIAGNOSIS — B351 Tinea unguium: Secondary | ICD-10-CM | POA: Diagnosis not present

## 2016-11-06 DIAGNOSIS — E119 Type 2 diabetes mellitus without complications: Secondary | ICD-10-CM

## 2016-11-06 DIAGNOSIS — M79676 Pain in unspecified toe(s): Secondary | ICD-10-CM | POA: Diagnosis not present

## 2016-11-06 NOTE — Progress Notes (Addendum)
   Subjective:    Patient ID: [name], female    DOB: [birth year], 71 y.o.   MRN: [id number]  HPI this patient presents the office with chief complaint of long thick painful nails especially her big toes both feet.    She says that the nails are painful walking and wearing her shoes.  She has provided no self treatment nor sought any professional help. She presents the office today stating that she is diabetic , type II.  She admits to taking gabapentin for diabetic neuropathy.    Review of Systems  All other systems reviewed and are negative.      Objective:   Physical Exam GENERAL APPEARANCE: Alert, conversant. Appropriately groomed. No acute distress.  VASCULAR: Pedal pulses are  palpable at  [facility] and PT bilateral.  Capillary refill time is immediate to all digits,  Normal temperature gradient.  Digital hair growth is present bilateral  NEUROLOGIC: sensation is normal to 5.07 monofilament at 5/5 sites bilateral.  Light touch is intact bilateral, Muscle strength normal.  MUSCULOSKELETAL: acceptable muscle strength, tone and stability bilateral.  HAV 1st MPJ  B/L with hammer toes 2,3  B/L. NAILS   thick disfigured discolored nails with subungual debris with no evidence of bacterial infection.   DERMATOLOGIC: skin color, texture, and turgor are within normal limits.  No preulcerative lesions or ulcers  are seen, no interdigital maceration noted.  No open lesions present.  .         Assessment & Plan:  Onychomycosis  B/L  IE  Debridement of nails.  RTC 3 months   Gardiner Barefoot DPM

## 2016-11-07 ENCOUNTER — Ambulatory Visit
Admission: RE | Admit: 2016-11-07 | Discharge: 2016-11-07 | Disposition: A | Discharge status: Home / Self Care | Payer: Medicare Other | Source: Ambulatory Visit | Attending: Internal Medicine | Admitting: Internal Medicine

## 2016-11-07 DIAGNOSIS — Z1231 Encounter for screening mammogram for malignant neoplasm of breast: Secondary | ICD-10-CM

## 2017-01-08 MED FILL — VIT D2 1.25 MG (50,000 UNIT: 1.25 MG | 84 days supply | Qty: 12 | Fill #0

## 2017-02-20 MED FILL — GABAPENTIN 300 MG CAPSULE: 300 | 30 days supply | Qty: 30 | Fill #0

## 2017-02-28 MED FILL — BYETTA 10 MCG DOSE PEN INJ: 10 | 30 days supply | Qty: 2 | Fill #0

## 2017-03-06 MED FILL — UNIFINE PENTIPS 8MM 31G: 31G X 8 MM | 90 days supply | Qty: 200 | Fill #0

## 2017-03-28 MED FILL — ULORIC 40 MG TABLET: 40 | 30 days supply | Qty: 30 | Fill #0

## 2017-04-01 MED FILL — ATENOLOL 25 MG TABLET: 25 | 90 days supply | Qty: 90 | Fill #0

## 2017-05-01 MED FILL — BYETTA 10 MCG DOSE PEN INJ: 10 | 90 days supply | Qty: 7 | Fill #1

## 2017-05-29 MED FILL — ULORIC 40 MG TABLET: 40 | 30 days supply | Qty: 30 | Fill #1

## 2017-07-16 MED FILL — ULORIC 40 MG TABLET: 40 | 30 days supply | Qty: 30 | Fill #2

## 2017-08-19 ENCOUNTER — Other Ambulatory Visit: Payer: Self-pay | Admitting: Plastic Surgery

## 2017-08-19 MED FILL — ULORIC 40 MG TABLET: 40 | 30 days supply | Qty: 30 | Fill #3

## 2017-09-24 MED FILL — ULORIC 40 MG TABLET: 40 | 30 days supply | Qty: 30 | Fill #4

## 2017-10-14 ENCOUNTER — Other Ambulatory Visit: Payer: Self-pay | Admitting: Internal Medicine

## 2017-10-14 DIAGNOSIS — Z1231 Encounter for screening mammogram for malignant neoplasm of breast: Secondary | ICD-10-CM

## 2017-11-11 ENCOUNTER — Ambulatory Visit
Admission: RE | Admit: 2017-11-11 | Discharge: 2017-11-11 | Disposition: A | Discharge status: Home / Self Care | Payer: Medicare Other | Source: Ambulatory Visit | Attending: Internal Medicine | Admitting: Internal Medicine

## 2017-11-11 DIAGNOSIS — Z1231 Encounter for screening mammogram for malignant neoplasm of breast: Secondary | ICD-10-CM

## 2017-11-11 MED FILL — ULORIC 40 MG TABLET: 40 | 30 days supply | Qty: 30 | Fill #5

## 2017-11-28 IMAGING — DX DG CHEST 2V
2 series · 2 of 2 positions shown · non-contrast
Comparison: Chest radiograph performed 08/09/2014

CLINICAL DATA: Acute onset of syncope.  Initial encounter.

EXAM:
CHEST  2 VIEW

[w chest pa]
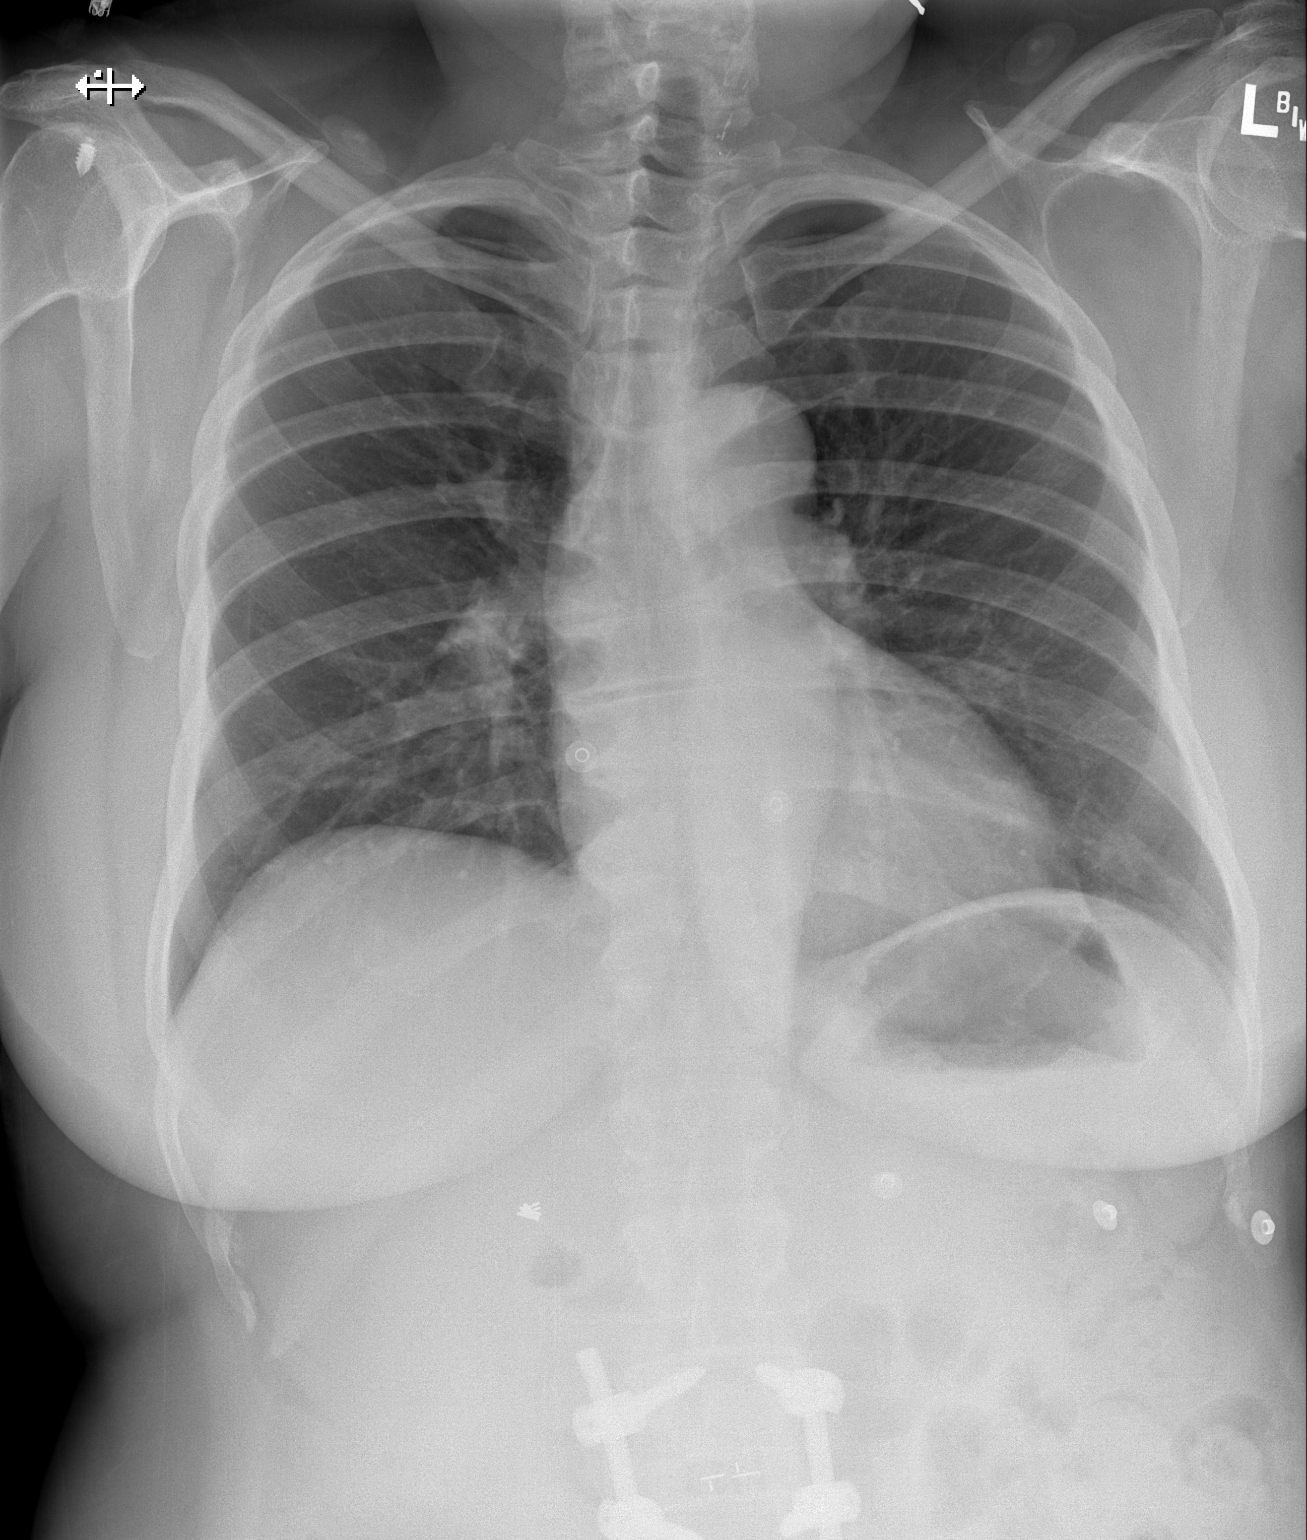

[w chest lat]
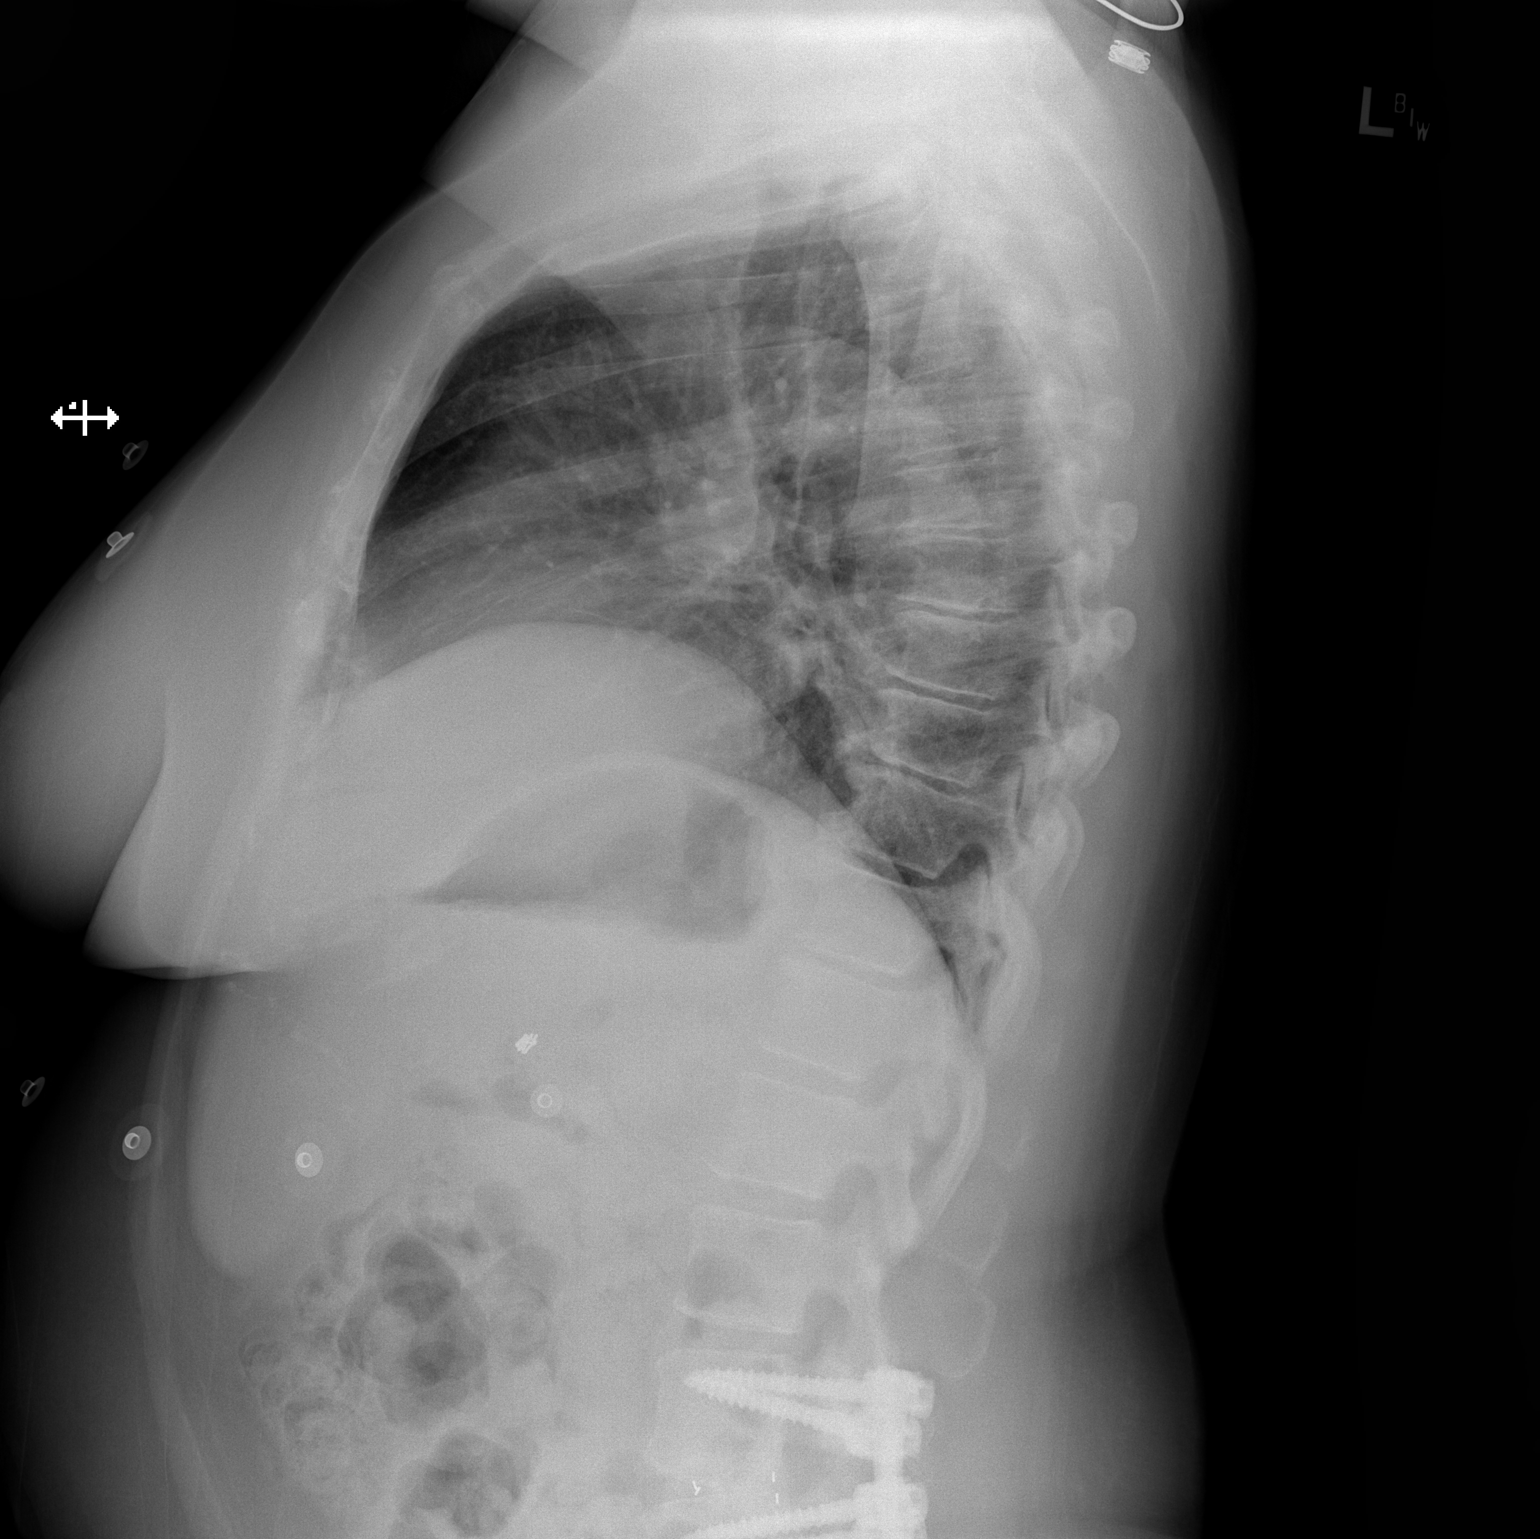

[2 of 2 positions shown; findings below may reference images not displayed]

FINDINGS: The lungs are well-aerated and clear. There is no evidence of focal
opacification, pleural effusion or pneumothorax.

The heart is normal in size; the mediastinal contour is within
normal limits. No acute osseous abnormalities are seen. Lumbar
spinal fusion hardware is partially imaged. Clips are noted within
the right upper quadrant, reflecting prior cholecystectomy.
IMPRESSION: No acute cardiopulmonary process seen.

## 2017-12-10 MED FILL — FEBUXOSTAT 40 MG TABS: 40 | 30 days supply | Qty: 30 | Fill #0

## 2018-01-10 MED FILL — FEBUXOSTAT 40 MG TABS: 40 | 30 days supply | Qty: 30 | Fill #1

## 2018-02-13 MED FILL — FEBUXOSTAT 40 MG TABS: 40 | 30 days supply | Qty: 30 | Fill #2

## 2018-02-25 NOTE — Progress Notes (Signed)
Cardiology Office Note:    Date:  02/26/2018   ID:  [name], DOB 03/16/1946, MRN [id number]  PCP:  Leanna Battles, MD  Cardiologist:  Sinclair Grooms, MD   Referring MD: Leanna Battles, MD   Chief Complaint  Patient presents with  . Shortness of Breath    History of Present Illness:    [name] is a 72 y.o. female with a hx of prior near syncope, atypical chest pain, WPW (asymptomatic), controlled type 2 diabetes mellitus, and palpitations.  On this occasion, Ms. terrain is concerned about exertional dyspnea and chest discomfort.  She describes the chest discomfort as discrete episodes of left subclavicular discomfort (poorly characterized) that can last up to 5 minutes.  Low background discomfort.  Not associated with radiation into the arm or neck.  No shortness of breath or palpitations associated.  Occurs spontaneously without precipitants.  The other complaint is that of exertional dyspnea.  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  She denies orthopnea, wheezing, cough, but has noted some mild lower extremity swelling.  This is been a slow progressive process.  Past Medical History:  Diagnosis Date  . Acute blood loss as cause of postoperative anemia 04/03/2016  . Anemia    hx of  . Anxiety    takes Xanax daily as needed  . Arthritis   . Bronchitis    a month ago  . Chronic back pain    stenosis  . Chronic kidney disease    stage 3-sees Dr.J Patel  . Class 3 obesity due to excess calories with body mass index (BMI) of 40.0 to 44.9 in adult   . Diabetes mellitus    type II; takes Byetta and Invokana daily  . Glaucoma    uses Eye Drops  . Gout    takes Uloric daily  . History of blood transfusion    no abnormal reaction   . History of colon polyps   . Hypertension    takes HCTZ daily as well  as Aldactone  . Hypothyroidism    takes Synthroid daily  . Pneumonia    many yrs ago  . PONV (postoperative nausea and vomiting)   . Primary localized osteoarthritis of right knee   . Stress incontinence   . Syncope   . Weakness    numbness and tingling in right hip/eg/  . Wears glasses   . Wolf-Parkinson-White syndrome    takes Atenolol daily    Past Surgical History:  Procedure Laterality Date  . ABDOMINAL HYSTERECTOMY    . ANKLE ARTHROPLASTY  2010   lt - pt states not arthroplasty but arthroscopy  . APPENDECTOMY    . BACK SURGERY  2006   lumb fusion  . BACK SURGERY  08/16/2014   L3-4 fusion  . BREAST BIOPSY    . BREAST ENHANCEMENT SURGERY Bilateral   . BREAST EXCISIONAL BIOPSY Left    benign  . CARDIAC CATHETERIZATION  >35yrs ago  . CARPAL TUNNEL RELEASE Bilateral    x4  . cataract surgery Bilateral   . CESAREAN SECTION     x 3  . CHOLECYSTECTOMY  1981  . COLONOSCOPY    . KNEE ARTHROSCOPY  2009   rt  . RAZ procedure  10+yrs ago  . REDUCTION MAMMAPLASTY Bilateral 2006  . ROTATOR CUFF REPAIR Right  x 4  . SHOULDER ARTHROSCOPY WITH ROTATOR CUFF REPAIR AND SUBACROMIAL DECOMPRESSION Right 06/10/2012   Procedure: SHOULDER ARTHROSCOPY WITH ROTATOR CUFF REPAIR AND SUBACROMIAL DECOMPRESSION;  Surgeon: Lorn Junes, MD;  Location: [facility];  Service: Orthopedics;  Laterality: Right;  RIGHT SHOULDER ARTHROSCOPY, DECOMPRESSION SUBACROMIAL PARTIAL ACROMIOPLASTY WITH CORACOACROMIAL RELEASE,, DISTAL CLAVICULECTOMY , ROTATOR CUFF debriedment  . SKIN BIOPSY    . THYROID SURGERY    . TOTAL KNEE ARTHROPLASTY Right 04/02/2016   Procedure: RIGHT TOTAL KNEE ARTHROPLASTY;  Surgeon: Elsie Saas, MD;  Location: [facility];  Service: Orthopedics;  Laterality: Right;    Current Medications: Current Meds  Medication Sig  . acetaminophen (TYLENOL) 325 MG tablet Take 2 tablets (650 mg total) by mouth every 6 (six) hours as needed for mild pain (or Fever >/= 101).  Marland Kitchen  ALPRAZolam (XANAX) 0.5 MG tablet Take 0.5 mg by mouth 3 (three) times daily as needed for anxiety.   Marland Kitchen aspirin 81 MG chewable tablet Chew by mouth daily.  Marland Kitchen atenolol (TENORMIN) 25 MG tablet Take 25 mg by mouth daily.  . febuxostat (ULORIC) 40 MG tablet Take 40 mg by mouth daily.   . furosemide (LASIX) 20 MG tablet Take 20 mg by mouth daily.  Marland Kitchen gabapentin (NEURONTIN) 300 MG capsule Take 300 mg by mouth at bedtime.  Marland Kitchen latanoprost (XALATAN) 0.005 % ophthalmic solution Place 1 drop into both eyes at bedtime. Reported on 10/21/2015  . levothyroxine (SYNTHROID, LEVOTHROID) 50 MCG tablet Take 50-100 mcg by mouth daily. Take 50 mcg daily, except 100 mcg by mouth on Sunday  . pravastatin (PRAVACHOL) 20 MG tablet Take 20 mg by mouth daily.  Marland Kitchen telmisartan (MICARDIS) 40 MG tablet Take half (1/2) tablet (20 mg) by mouth daily.  . TRULICITY 4.69 GE/9.5MW SOPN Inject 0.5 mLs into the skin every 7 (seven) days. Wednesdays     Allergies:   Contrast media [iodinated diagnostic agents]; Adhesive [tape]; Esomeprazole magnesium; Morphine and related; Talwin [pentazocine]; Allopurinol; Codeine; Colchicine; Hydrocodone; Meperidine hcl; Oxycodone; and Pentazocine lactate   Social History   Socioeconomic History  . Marital status: Single    Spouse name: Not on file  . Number of children: Not on file  . Years of education: Not on file  . Highest education level: Not on file  Occupational History  . Not on file  Social Needs  . Financial resource strain: Not on file  . Food insecurity:    Worry: Not on file    Inability: Not on file  . Transportation needs:    Medical: Not on file    Non-medical: Not on file  Tobacco Use  . Smoking status: Former Research scientist (life sciences)  . Smokeless tobacco: Never Used  . Tobacco comment: quit smoking 29yrs ago  Substance and Sexual Activity  . Alcohol use: No  . Drug use: No  . Sexual activity: Not on file  Lifestyle  . Physical activity:    Days per week: Not on file    Minutes per  session: Not on file  . Stress: Not on file  Relationships  . Social connections:    Talks on phone: Not on file    Gets together: Not on file    Attends religious service: Not on file    Active member of club or organization: Not on file    Attends meetings of clubs or organizations: Not on file    Relationship status: Not on file  Other Topics Concern  . Not on file  Social  History Narrative  . Not on file     Family History: The patient's family history includes Arthritis in her unknown relative; Diabetes in her mother; Heart attack in her father; Hypertension in her unknown relative.  ROS:   Please see the history of present illness.    Joint swelling, difficulty with balance.  She denies difficulty sleeping and no complaints of snoring.  Has medical kidney disease and sees Dr. Posey Pronto..  All other systems reviewed and are negative.  EKGs/Labs/Other Studies Reviewed:    The following studies were reviewed today: No recent functional data.  Echocardiogram August 2017: ------------------------------------------------------------------- Study Conclusions  - Left ventricle: The cavity size was normal. Wall thickness was   normal. Systolic function was normal. The estimated ejection   fraction was in the range of 55% to 60%. Wall motion was normal;   there were no regional wall motion abnormalities. Doppler   parameters are consistent with abnormal left ventricular   relaxation (grade 1 diastolic dysfunction). - Atrial septum: There was an atrial septal aneurysm.  Impressions:  - Normal LV systolic function; grade 1 diastolic dysfunction; trace   MR and TR.   EKG:  EKG is  ordered today.  The ekg ordered today demonstrates normal sinus rhythm with normal appearance.  When compared to 10/21/2015, no change has occurred.  Recent Labs: No results found for requested labs within last 8760 hours.  Recent Lipid Panel No results found for: CHOL, TRIG, HDL, CHOLHDL, VLDL,  LDLCALC, LDLDIRECT  Physical Exam:    VS:  BP 110/70   Pulse 63   Ht 5\' 3"  (1.6 m)   Wt 234 lb (106.1 kg)   BMI 41.45 kg/m     Wt Readings from Last 3 Encounters:  02/26/18 234 lb (106.1 kg)  04/16/16 227 lb (103 kg)  04/09/16 227 lb (103 kg)     GEN: Obese.  Well nourished, well developed in no acute distress HEENT: Normal NECK: No JVD. LYMPHATICS: No lymphadenopathy CARDIAC: RRR, no murmur, no gallop, no edema. VASCULAR: Diminished right radial pulses.  No bruits. RESPIRATORY:  Clear to auscultation without rales, wheezing or rhonchi  ABDOMEN: Soft, non-tender, non-distended, No pulsatile mass, MUSCULOSKELETAL: No deformity  SKIN: Warm and dry NEUROLOGIC:  Alert and oriented x 3 PSYCHIATRIC:  Normal affect   ASSESSMENT:    1. Shortness of breath   2. Other chest pain   3. Atherosclerosis of native coronary artery of native heart without angina pectoris   4. WOLFF (WOLFE)-PARKINSON-WHITE (WPW) SYNDROME   5. Essential hypertension    PLAN:    In order of problems listed above:  1. Suspect this is deconditioning related.  Exercise treadmill test will be done to rule out anginal equivalent 2. Atypical without features of angina pectoris.  Female gender and underlying diabetes however does require some functional testing to exclude obvious ischemia. 3. Nonobstructive coronary disease in the setting of diabetes mellitus which is under excellent control.  Most recent hemoglobin A1c was less than 6. 4. No evidence of WPW on today's EKG. 5. Excellent blood pressure control with target 130/80 mmHg. 6. Most recent LDL was 89 in January 2019.  Greater than 50% of the time during this office visit was spent in education, counseling, and coordination of care related to underlying disease process and testing as outlined.    Medication Adjustments/Labs and Tests Ordered: Current medicines are reviewed at length with the patient today.  Concerns regarding medicines are outlined  above.  Orders Placed This Encounter  Procedures  . EXERCISE TOLERANCE TEST (ETT)  . EKG 12-Lead   No orders of the defined types were placed in this encounter.   Patient Instructions  Medication Instructions:  Your physician recommends that you continue on your current medications as directed. Please refer to the Current Medication list given to you today.  If you need a refill on your cardiac medications before your next appointment, please call your pharmacy.   Lab work: none If you have labs (blood work) drawn today and your tests are completely normal, you will receive your results only by: Marland Kitchen MyChart Message (if you have MyChart) OR . A paper copy in the mail If you have any lab test that is abnormal or we need to change your treatment, we will call you to review the results.  Testing/Procedures: Your physician has requested that you have an exercise tolerance test. For further information please visit [email or web address]. Please also follow instruction sheet, as given.    Follow-Up: At [facility], you and your health needs are our priority.  As part of our continuing mission to provide you with exceptional heart care, we have created designated Provider Care Teams.  These Care Teams include your primary Cardiologist (physician) and Advanced Practice Providers (APPs -  Physician Assistants and Nurse Practitioners) who all work together to provide you with the care you need, when you need it. You will need a follow up appointment in 12 months.  Please call our office 2 months in advance to schedule this appointment.  You may see Sinclair Grooms, MD or one of the following Advanced Practice Providers on your designated Care Team:   Truitt Merle, NP Cecilie Kicks, NP . Kathyrn Drown, NP  Any Other Special Instructions Will Be Listed Below (If Applicable).       Signed, Sinclair Grooms, MD  02/26/2018 9:36 AM    [place]

## 2018-02-26 ENCOUNTER — Ambulatory Visit: Payer: Medicare Other | Admitting: Interventional Cardiology

## 2018-02-26 ENCOUNTER — Encounter: Payer: Self-pay | Admitting: Interventional Cardiology

## 2018-02-26 VITALS — BP 110/70 | HR 63 | Ht 63.0 in | Wt 234.0 lb

## 2018-02-26 DIAGNOSIS — I456 Pre-excitation syndrome: Secondary | ICD-10-CM

## 2018-02-26 DIAGNOSIS — R0789 Other chest pain: Secondary | ICD-10-CM | POA: Diagnosis not present

## 2018-02-26 DIAGNOSIS — R0602 Shortness of breath: Secondary | ICD-10-CM

## 2018-02-26 DIAGNOSIS — E785 Hyperlipidemia, unspecified: Secondary | ICD-10-CM

## 2018-02-26 DIAGNOSIS — I2510 Atherosclerotic heart disease of native coronary artery without angina pectoris: Secondary | ICD-10-CM | POA: Diagnosis not present

## 2018-02-26 DIAGNOSIS — I10 Essential (primary) hypertension: Secondary | ICD-10-CM

## 2018-02-26 NOTE — Patient Instructions (Signed)
Medication Instructions:  Your physician recommends that you continue on your current medications as directed. Please refer to the Current Medication list given to you today.  If you need a refill on your cardiac medications before your next appointment, please call your pharmacy.   Lab work: none If you have labs (blood work) drawn today and your tests are completely normal, you will receive your results only by: Marland Kitchen MyChart Message (if you have MyChart) OR . A paper copy in the mail If you have any lab test that is abnormal or we need to change your treatment, we will call you to review the results.  Testing/Procedures: Your physician has requested that you have an exercise tolerance test. For further information please visit [email or web address]. Please also follow instruction sheet, as given.    Follow-Up: At [facility], you and your health needs are our priority.  As part of our continuing mission to provide you with exceptional heart care, we have created designated Provider Care Teams.  These Care Teams include your primary Cardiologist (physician) and Advanced Practice Providers (APPs -  Physician Assistants and Nurse Practitioners) who all work together to provide you with the care you need, when you need it. You will need a follow up appointment in 12 months.  Please call our office 2 months in advance to schedule this appointment.  You may see Sinclair Grooms, MD or one of the following Advanced Practice Providers on your designated Care Team:   Truitt Merle, NP Cecilie Kicks, NP . Kathyrn Drown, NP  Any Other Special Instructions Will Be Listed Below (If Applicable).

## 2018-03-06 DIAGNOSIS — M79672 Pain in left foot: Secondary | ICD-10-CM | POA: Insufficient documentation

## 2018-03-10 ENCOUNTER — Telehealth: Payer: Self-pay | Admitting: Interventional Cardiology

## 2018-03-10 NOTE — Telephone Encounter (Signed)
Patient recently had injection in her ankle was advised by her surgeon she will not be able to walk on the treadmill for the stress test.  She has appt for her stress test for this coming Wednesday on 11/13. She is wondering if she have can the one were she is laying down.

## 2018-03-10 NOTE — Telephone Encounter (Signed)
Will route to Dr Tamala Julian for review and orders.

## 2018-03-11 NOTE — Telephone Encounter (Addendum)
Called and left message for pt to call back.  I asked on the message to let us know if the surgeon that did the injection mentioned when she would be able to walk on a treadmill or if he just advised against that completely.  Spoke with Dr. Tamala Julian and he said ok to switch to [place] if unable to do GXT at all.

## 2018-03-11 NOTE — Telephone Encounter (Signed)
  Patient calling back to see if she still needs to come 03/12/18 for her 9:00 appt for stress test. She will be unable to walk on the treadmill due to her ankle issues. She just had an injection put in her ankle and her doctor told her she cannot walk on the treadmill. Patient would like to know if there is another type of testing she can have in place of treadmill. Patient will be at home till 12:30 then she leaves for work but stated that it is okay to leave a message on her voicemail.

## 2018-03-12 NOTE — Telephone Encounter (Signed)
Follow up   Patient calling to advise that the surgeon advise that she can not do the ETT at all. She said for right now cancel the appt for today. (I did). Patient will be at home until about 11:30. Please call to discuss.

## 2018-03-12 NOTE — Telephone Encounter (Signed)
Do not reschedule this.

## 2018-03-12 NOTE — Telephone Encounter (Signed)
LMTCB

## 2018-03-13 NOTE — Telephone Encounter (Signed)
Pt advised per Dr. Tamala Julian no need to reschedule her ETT.Marland Kitchen She says that her SOB has been much improved since her bronchitis has been improving. Will call if she has any further problems otherwise will keep her 1 year OV with Dr. Tamala Julian.

## 2018-03-13 NOTE — Telephone Encounter (Signed)
Patient returned call.  She is available until 12pm.  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

## 2018-03-21 MED FILL — FEBUXOSTAT 40 MG TABS: 40 | 30 days supply | Qty: 30 | Fill #3

## 2018-04-14 ENCOUNTER — Telehealth: Payer: Self-pay | Admitting: Interventional Cardiology

## 2018-04-14 DIAGNOSIS — R0602 Shortness of breath: Secondary | ICD-10-CM

## 2018-04-14 DIAGNOSIS — R0789 Other chest pain: Secondary | ICD-10-CM

## 2018-04-14 NOTE — Telephone Encounter (Signed)
New Message   Patient is calling to advise that she can not do the ETT she has to have the [place]. She has a hurt ankle and is to have surgery in July 2020. Patient says she is available until 12pm.  Please call to advise.

## 2018-04-14 NOTE — Telephone Encounter (Signed)
Ok to perform [employer]

## 2018-04-14 NOTE — Telephone Encounter (Signed)
Pt was to have a GXT back in November for SOB and CP.  Unable to walk on treadmill due to ankle injury.  Ok to switch to [place]?

## 2018-04-14 NOTE — Telephone Encounter (Signed)
Order placed for [name].    Left message to call back to go over instructions.

## 2018-04-15 NOTE — Telephone Encounter (Signed)
Spoke with pt and went over instructions for stress test.  Pt verbalized understanding and was appreciative for call.

## 2018-04-18 ENCOUNTER — Telehealth (HOSPITAL_COMMUNITY): Payer: Self-pay | Admitting: *Deleted

## 2018-04-18 NOTE — Telephone Encounter (Signed)
Left message on voicemail in reference to upcoming appointment scheduled for 04/22/18. Phone number given for a call back so details instructions can be given. [name]

## 2018-04-22 ENCOUNTER — Ambulatory Visit (HOSPITAL_COMMUNITY): Payer: Medicare Other | Attending: Cardiovascular Disease

## 2018-04-22 ENCOUNTER — Encounter (INDEPENDENT_AMBULATORY_CARE_PROVIDER_SITE_OTHER): Payer: Self-pay

## 2018-04-22 DIAGNOSIS — R0602 Shortness of breath: Secondary | ICD-10-CM | POA: Insufficient documentation

## 2018-04-22 DIAGNOSIS — R0789 Other chest pain: Secondary | ICD-10-CM | POA: Diagnosis present

## 2018-04-22 LAB — MYOCARDIAL PERFUSION IMAGING
CHL CUP NUCLEAR SRS: 0
CHL CUP RESTING HR STRESS: 62 {beats}/min
LVDIAVOL: 66 mL (ref 46–106)
LVSYSVOL: 25 mL
Peak HR: 85 {beats}/min
SDS: 1
SSS: 1
TID: 0.78

## 2018-04-22 MED ORDER — TECHNETIUM TC 99M TETROFOSMIN IV KIT
32.4000 | PACK | Freq: Once | INTRAVENOUS | Status: AC | PRN
  Administered 2018-04-22: 32.4 via INTRAVENOUS
  Filled 2018-04-22: qty 33

## 2018-04-22 MED ORDER — TECHNETIUM TC 99M TETROFOSMIN IV KIT
10.1000 | PACK | Freq: Once | INTRAVENOUS | Status: AC | PRN
  Administered 2018-04-22: 10.1 via INTRAVENOUS
  Filled 2018-04-22: qty 11

## 2018-04-22 MED ORDER — REGADENOSON 0.4 MG/5ML IV SOLN
0.4000 mg | Freq: Once | INTRAVENOUS | Status: AC
Start: 2018-04-22 — End: 2018-04-22
  Administered 2018-04-22: 0.4 mg via INTRAVENOUS

## 2018-04-22 MED FILL — FEBUXOSTAT 40 MG TABS: 40 | 30 days supply | Qty: 30 | Fill #4

## 2018-06-25 ENCOUNTER — Encounter: Payer: Medicare Other | Attending: Internal Medicine | Admitting: *Deleted

## 2018-06-25 DIAGNOSIS — E119 Type 2 diabetes mellitus without complications: Secondary | ICD-10-CM | POA: Diagnosis not present

## 2018-06-25 NOTE — Patient Instructions (Signed)
Plan:  Aim for 2 Carb Choices per meal (30 grams) +/- 1 either way  Aim for 0-1 Carbs per snack if hungry  Include protein in moderation with your meals and snacks We discussed packing a snack of carb and protein for you to have at work before the children arrive Consider reading food labels for Total Carbohydrate and Fat Grams of foods Consider  increasing your activity level by doing some Arm Chair Exercises for 10 minutes twice daily as tolerated Continue checking BG at alternate times per day  Continue taking medication as directed by MD

## 2018-06-25 NOTE — Progress Notes (Signed)
Diabetes Self-Management Education  Visit Type: First/Initial  Appt. Start Time: 0830 Appt. End Time: 0930  06/25/2018  Ms. [name], identified by name and date of birth, is a 73 y.o. female with a diagnosis of Diabetes: Type 2. 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 Her concerns include desire to lose weight, problem with increasing her activity level due to knee and ankle pain as well as poor balance. She also gets very hungry by the time she gets off work, so tends to eat larger portions for evening meal. She does not cook, so most of her food is bought at [employer] and brought home to eat.  ASSESSMENT  Height 5\' 3"  (1.6 m), weight 234 lb 8 oz (106.4 kg). Body mass index is 41.54 kg/m.  Diabetes Self-Management Education - 06/25/18 1443      Visit Information   Visit Type  First/Initial      Initial Visit   Diabetes Type  Type 2    Are you currently following a meal plan?  No    Are you taking your medications as prescribed?  Yes    Date Diagnosed  Strum   How would you rate your overall health?  Fair      Psychosocial Assessment   Patient Belief/Attitude about Diabetes  Motivated to manage diabetes    Self-care barriers  None    Self-management support  Friends;Doctor's office    Other persons present  Patient    Patient Concerns  Nutrition/Meal planning;Weight Control    Special Needs  None    Preferred Learning Style  No preference indicated    Learning Readiness  Change in progress    How often do you need to have someone help you when you read instructions, pamphlets, or other written materials from your doctor or pharmacy?  1 - Never    What is the last grade level you completed in school?  12      Pre-Education Assessment   Patient understands the diabetes disease and treatment process.  Demonstrates understanding  / competency    Patient understands incorporating nutritional management into lifestyle.  Needs Instruction    Patient undertands incorporating physical activity into lifestyle.  Needs Instruction    Patient understands using medications safely.  Demonstrates understanding / competency    Patient understands monitoring blood glucose, interpreting and using results  Needs Review    Patient understands prevention, detection, and treatment of acute complications.  Demonstrates understanding / competency    Patient understands prevention, detection, and treatment of chronic complications.  Demonstrates understanding / competency    Patient understands how to develop strategies to address psychosocial issues.  Demonstrates understanding / competency    Patient understands how to develop strategies to promote health/change behavior.  Demonstrates understanding / competency      Complications   Last HgB A1C per patient/outside source  5.8 %    How often do you check your blood sugar?  1-2 times/day    Fasting Blood glucose range (mg/dL)  70-129    Postprandial Blood glucose range (mg/dL)  70-129    Number of hypoglycemic episodes per month  0    Have you had a dilated eye exam in the past 12 months?  Yes    Have you had a dental exam in the past 12 months?  Yes    Are you checking your feet?  Yes    How many days per week are you checking your feet?  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  0      Patient Education   Previous Diabetes Education  Yes (please comment)    Disease state   Explored patient's options for treatment of their diabetes    Nutrition management   Role of diet in the treatment of diabetes and the relationship between the three main macronutrients and blood glucose level;Carbohydrate counting;Information on hints to eating out and maintain blood glucose control.    Physical activity and exercise   Helped patient identify appropriate exercises in relation to his/her diabetes, diabetes complications and other health issue.      Individualized Goals (developed by patient)   Nutrition  Follow meal plan discussed    Physical Activity  Exercise 5-7 days per week;15 minutes per day    Medications  take my medication as prescribed    Monitoring   test blood glucose pre and post meals as discussed      Post-Education Assessment   Patient understands the diabetes disease and treatment process.  Demonstrates understanding / competency    Patient understands incorporating nutritional management into lifestyle.  Demonstrates understanding / competency    Patient undertands incorporating physical activity into lifestyle.  Demonstrates understanding / competency    Patient understands using medications safely.  Demonstrates understanding / competency    Patient understands monitoring blood glucose, interpreting and using results  Demonstrates understanding / competency    Patient understands prevention, detection, and treatment of acute complications.  Demonstrates understanding / competency    Patient understands prevention, detection, and treatment of chronic complications.  Demonstrates understanding / competency    Patient understands how to develop strategies to address psychosocial issues.  Demonstrates understanding / competency    Patient understands how to develop  strategies to promote health/change behavior.  Demonstrates understanding / competency      Outcomes   Expected Outcomes  Demonstrated interest in learning. Expect positive outcomes    Future DMSE  3-4 months    Program Status  Not Completed       Individualized Plan for Diabetes Self-Management Training:   Learning Objective:  Patient will have a greater understanding of diabetes self-management. Patient education plan is to attend individual and/or group sessions per assessed needs and concerns.   Plan:   Patient Instructions  Plan:  Aim for 2 Carb Choices per meal (30 grams) +/- 1 either way  Aim for 0-1 Carbs per snack if hungry  Include protein in moderation with your meals and snacks We discussed packing a snack of carb and protein  for you to have at work before the children arrive Consider reading food labels for Total Carbohydrate and Fat Grams of foods Consider  increasing your activity level by doing some Arm Chair Exercises for 10 minutes twice daily as tolerated Continue checking BG at alternate times per day  Continue taking medication as directed by MD  Expected Outcomes:  Demonstrated interest in learning. Expect positive outcomes  Education material provided: Meal plan card and Carbohydrate counting sheet  If problems or questions, patient to contact team via:  Phone  Future DSME appointment: 3-4 months

## 2018-11-17 ENCOUNTER — Other Ambulatory Visit: Payer: Self-pay

## 2018-11-17 DIAGNOSIS — Z20828 Contact with and (suspected) exposure to other viral communicable diseases: Secondary | ICD-10-CM

## 2018-11-17 DIAGNOSIS — Z20822 Contact with and (suspected) exposure to covid-19: Secondary | ICD-10-CM

## 2018-11-20 LAB — NOVEL CORONAVIRUS, NAA: SARS-CoV-2, NAA: NOT DETECTED

## 2018-12-17 ENCOUNTER — Other Ambulatory Visit: Payer: Self-pay | Admitting: Internal Medicine

## 2018-12-17 DIAGNOSIS — Z1231 Encounter for screening mammogram for malignant neoplasm of breast: Secondary | ICD-10-CM

## 2019-01-29 ENCOUNTER — Ambulatory Visit
Admission: RE | Admit: 2019-01-29 | Discharge: 2019-01-29 | Disposition: A | Discharge status: Home / Self Care | Payer: Medicare Other | Source: Ambulatory Visit | Attending: Internal Medicine | Admitting: Internal Medicine

## 2019-01-29 ENCOUNTER — Other Ambulatory Visit: Payer: Self-pay

## 2019-01-29 DIAGNOSIS — Z1231 Encounter for screening mammogram for malignant neoplasm of breast: Secondary | ICD-10-CM

## 2019-03-29 NOTE — Progress Notes (Signed)
Cardiology Office Note:    Date:  04/01/2019   ID:  [name], DOB Dec 29, 1945, MRN [id number]  PCP:  Leanna Battles, MD  Cardiologist:  Sinclair Grooms, MD   Referring MD: Leanna Battles, MD   Chief Complaint  Patient presents with  . Chest Pain  . Hypertension    History of Present Illness:    [name] is a 73 y.o. female with a hx of near syncope, atypical chest pain, WPW (asymptomatic), controlled type 2 diabetes mellitus, and palpitations.  Dottye has had intermittent chest discomfort that occurs spontaneously.  There is some radiation to the clavicle.  She wonders if it is GI.  There is no exertional component.  She does have some shortness of breath when it occurs.  She also has reflux.  This is been more severe lately.  She has not been active due to the Covid pandemic.  She is compliant with medical regimen.  There has been physical and activity.  Past Medical History:  Diagnosis Date  . Acute blood loss as cause of postoperative anemia 04/03/2016  . Anemia    hx of  . Anxiety    takes Xanax daily as needed  . Arthritis   . Bronchitis    a month ago  . Chronic back pain    stenosis  . Chronic kidney disease    stage 3-sees Dr.J Patel  . Class 3 obesity due to excess calories with body mass index (BMI) of 40.0 to 44.9 in adult   . Diabetes mellitus    type II; takes Byetta and Invokana daily  . Glaucoma    uses Eye Drops  . Gout    takes Uloric daily  . History of blood transfusion    no abnormal reaction   . History of colon polyps   . Hypertension    takes HCTZ daily as well as Aldactone  . Hypothyroidism    takes Synthroid daily  . Pneumonia    many yrs ago  . PONV (postoperative nausea and vomiting)   . Primary localized osteoarthritis of right knee   . Stress incontinence   . Syncope   . Weakness    numbness and tingling in right hip/eg/  . Wears glasses   . Wolf-Parkinson-White syndrome    takes Atenolol daily    Past  Surgical History:  Procedure Laterality Date  . ABDOMINAL HYSTERECTOMY    . ANKLE ARTHROPLASTY  2010   lt - pt states not arthroplasty but arthroscopy  . APPENDECTOMY    . BACK SURGERY  2006   lumb fusion  . BACK SURGERY  08/16/2014   L3-4 fusion  . BREAST BIOPSY    . BREAST ENHANCEMENT SURGERY Bilateral   . BREAST EXCISIONAL BIOPSY Left    benign  . CARDIAC CATHETERIZATION  >35yrs ago  . CARPAL TUNNEL RELEASE Bilateral    x4  . cataract surgery Bilateral   . CESAREAN SECTION     x 3  . CHOLECYSTECTOMY  1981  . COLONOSCOPY    . KNEE ARTHROSCOPY  2009   rt  . RAZ procedure  10+yrs ago  . REDUCTION MAMMAPLASTY Bilateral 2006  . ROTATOR CUFF REPAIR Right    x 4  . SHOULDER ARTHROSCOPY WITH ROTATOR CUFF REPAIR AND SUBACROMIAL DECOMPRESSION Right 06/10/2012   Procedure: SHOULDER ARTHROSCOPY WITH ROTATOR CUFF REPAIR AND SUBACROMIAL DECOMPRESSION;  Surgeon: Lorn Junes, MD;  Location: [facility];  Service: Orthopedics;  Laterality: Right;  RIGHT SHOULDER ARTHROSCOPY, DECOMPRESSION SUBACROMIAL PARTIAL ACROMIOPLASTY WITH CORACOACROMIAL RELEASE,, DISTAL CLAVICULECTOMY , ROTATOR CUFF debriedment  . SKIN BIOPSY    . THYROID SURGERY    . TOTAL KNEE ARTHROPLASTY Right 04/02/2016   Procedure: RIGHT TOTAL KNEE ARTHROPLASTY;  Surgeon: Elsie Saas, MD;  Location: [facility];  Service: Orthopedics;  Laterality: Right;    Current Medications: Current Meds  Medication Sig  . ALPRAZolam (XANAX) 0.5 MG tablet Take 0.5 mg by mouth 3 (three) times daily as needed for anxiety.   Marland Kitchen aspirin 81 MG chewable tablet Chew by mouth daily.  Marland Kitchen atenolol (TENORMIN) 25 MG tablet Take 25 mg by mouth daily.  . febuxostat (ULORIC) 40 MG tablet Take 40 mg by mouth daily.   . furosemide (LASIX) 20 MG tablet Take 20 mg by mouth daily.  Marland Kitchen gabapentin (NEURONTIN) 300 MG capsule Take 300 mg by mouth at bedtime.  Marland Kitchen latanoprost (XALATAN) 0.005 % ophthalmic solution Place 1 drop into both eyes at bedtime.  Reported on 10/21/2015  . levothyroxine (SYNTHROID, LEVOTHROID) 50 MCG tablet Take 50-100 mcg by mouth daily. Take 50 mcg daily, except 100 mcg by mouth on Sunday  . pravastatin (PRAVACHOL) 20 MG tablet Take 20 mg by mouth daily.  Marland Kitchen telmisartan (MICARDIS) 40 MG tablet Take half (1/2) tablet (20 mg) by mouth daily.  . TRULICITY [id number] [id number] SOPN Inject 0.5 mLs into the skin every 7 (seven) days. Wednesdays     Allergies:   Contrast media [iodinated diagnostic agents], Adhesive [tape], Esomeprazole magnesium, Morphine and related, Talwin [pentazocine], Allopurinol, Codeine, Colchicine, Hydrocodone, Meperidine hcl, Oxycodone, and Pentazocine lactate   Social History   Socioeconomic History  . Marital status: Single    Spouse name: Not on file  . Number of children: Not on file  . Years of education: Not on file  . Highest education level: Not on file  Occupational History  . Not on file  Social Needs  . Financial resource strain: Not on file  . Food insecurity    Worry: Not on file    Inability: Not on file  . Transportation needs    Medical: Not on file    Non-medical: Not on file  Tobacco Use  . Smoking status: Former Research scientist (life sciences)  . Smokeless tobacco: Never Used  . Tobacco comment: quit smoking 66yrs ago  Substance and Sexual Activity  . Alcohol use: No  . Drug use: No  . Sexual activity: Not on file  Lifestyle  . Physical activity    Days per week: Not on file    Minutes per session: Not on file  . Stress: Not on file  Relationships  . Social Herbalist on phone: Not on file    Gets together: Not on file    Attends religious service: Not on file    Active member of club or organization: Not on file    Attends meetings of clubs or organizations: Not on file    Relationship status: Not on file  Other Topics Concern  . Not on file  Social History Narrative  . Not on file     Family History: The patient's family history includes Arthritis in an other family  member; Diabetes in her mother; Heart attack in her father; Hypertension in an other family member. There is no history of Breast cancer.  ROS:   Please see the history of present illness.    History of reflux.  Increasing weight.  Physical inactivity.  All other systems reviewed  and are negative.  EKGs/Labs/Other Studies Reviewed:    The following studies were reviewed today: Nuclear study 12 months ago negative for ischemia.  EKG:  EKG normal sinus rhythm with no significant abnormality.  There is no change compared to the prior.  Recent Labs: No results found for requested labs within last 8760 hours.  Recent Lipid Panel No results found for: CHOL, TRIG, HDL, CHOLHDL, VLDL, LDLCALC, LDLDIRECT  Physical Exam:    VS:  BP 108/68   Pulse 61   Ht 5\' 3"  (1.6 m)   Wt 249 lb 12.8 oz (113.3 kg)   SpO2 97%   BMI 44.25 kg/m     Wt Readings from Last 3 Encounters:  04/01/19 249 lb 12.8 oz (113.3 kg)  06/25/18 234 lb 8 oz (106.4 kg)  04/22/18 234 lb (106.1 kg)     GEN: Obese. No acute distress HEENT: Normal NECK: No JVD. LYMPHATICS: No lymphadenopathy CARDIAC:  RRR without murmur, gallop, or edema. VASCULAR:  Normal Pulses. No bruits. RESPIRATORY:  Clear to auscultation without rales, wheezing or rhonchi  ABDOMEN: Soft, non-tender, non-distended, No pulsatile mass, MUSCULOSKELETAL: No deformity  SKIN: Warm and dry NEUROLOGIC:  Alert and oriented x 3 PSYCHIATRIC:  Normal affect   ASSESSMENT:    1. Atherosclerosis of native coronary artery of native heart without angina pectoris   2. WOLFF (WOLFE)-PARKINSON-WHITE (WPW) SYNDROME   3. Essential hypertension   4. Hyperlipidemia LDL goal <70   5. Shortness of breath   6. Educated about COVID-19 virus infection   7. Precordial pain    PLAN:    In order of problems listed above:  1. Recurring chest discomfort.  Relatively new over the last 3 to 6 months.  Multiple risk factors.  Plan coronary CTA with FFR if indicated as  the patient's symptoms are atypical and I believe relatively low risk. 2. No recurrent arrhythmia 3. Excellent blood pressure control on ARB/beta-blocker/diuretic therapy. 4. Resolved. 5. The 3W's is being observed to decrease the risk of COVID-19 infection.  Overall education and awareness concerning primary/secondary risk prevention was discussed in detail: LDL less than 70, hemoglobin A1c less than 7, blood pressure target less than 130/80 mmHg, >150 minutes of moderate aerobic activity per week, avoidance of smoking, weight control (via diet and exercise), and continued surveillance/management of/for obstructive sleep apnea.    Medication Adjustments/Labs and Tests Ordered: Current medicines are reviewed at length with the patient today.  Concerns regarding medicines are outlined above.  Orders Placed This Encounter  Procedures  . CT CORONARY MORPH W/CTA COR W/SCORE W/CA W/CM &/OR WO/CM  . CT CORONARY FRACTIONAL FLOW RESERVE DATA PREP  . CT CORONARY FRACTIONAL FLOW RESERVE FLUID ANALYSIS  . EKG 12-Lead   Meds ordered this encounter  Medications  . predniSONE (DELTASONE) 50 MG tablet    Sig: Take one tablet by mouth 13 hours prior to CT, 7 hours prior to CT and 1 hour prior to CT    Dispense:  3 tablet    Refill:  0    Patient Instructions  Medication Instructions:  Your physician recommends that you continue on your current medications as directed. Please refer to the Current Medication list given to you today.  *If you need a refill on your cardiac medications before your next appointment, please call your pharmacy*  Lab Work: You will need a BMET prior to your Coronary CT  If you have labs (blood work) drawn today and your tests are completely normal, you will receive  your results only by: Marland Kitchen MyChart Message (if you have MyChart) OR . A paper copy in the mail If you have any lab test that is abnormal or we need to change your treatment, we will call you to review the  results.  Testing/Procedures: Your physician recommends that you have a Coronary CT performed.   Follow-Up: At [facility], you and your health needs are our priority.  As part of our continuing mission to provide you with exceptional heart care, we have created designated Provider Care Teams.  These Care Teams include your primary Cardiologist (physician) and Advanced Practice Providers (APPs -  Physician Assistants and Nurse Practitioners) who all work together to provide you with the care you need, when you need it.  Your next appointment:   12 month(s)  The format for your next appointment:   In Person  Provider:   You may see Sinclair Grooms, MD or one of the following Advanced Practice Providers on your designated Care Team:    Truitt Merle, NP  Cecilie Kicks, NP  Kathyrn Drown, NP   Other Instructions  Your cardiac CT will be scheduled at one of the below locations:   [facility] [address] [place], [place] [address] [phone]  [facility] [address] [place], [place] [address] [phone]  If scheduled at [facility], please arrive at the [facility] main entrance of [facility] 30-45 minutes prior to test start time. Proceed to the [facility] Radiology Department (first floor) to check-in and test prep.  If scheduled at [facility], please arrive 15 mins early for check-in and test prep.  Please follow these instructions carefully (unless otherwise directed):   On the Night Before the Test: . Be sure to Drink plenty of water. . Do not consume any caffeinated/decaffeinated beverages or chocolate 12 hours prior to your test. . Do not take any antihistamines 12 hours prior to your test. . If the patient has contrast allergy: ? Patient will need a prescription for Prednisone and very clear instructions (as follows): 1. Prednisone 50 mg -  take 13 hours prior to test 2. Take another Prednisone 50 mg 7 hours prior to test 3. Take another Prednisone 50 mg 1 hour prior to test 4. Take Benadryl 50 mg 1 hour prior to test . Patient must complete all four doses of above prophylactic medications. . Patient will need a ride after test due to Benadryl.  On the Day of the Test: . Drink plenty of water. Do not drink any water within one hour of the test. . Do not eat any food 4 hours prior to the test. . You may take your regular medications prior to the test.  . HOLD Furosemide/Hydrochlorothiazide morning of the test. . FEMALES- please wear underwire-free bra if available       After the Test: . Drink plenty of water. . After receiving IV contrast, you may experience a mild flushed feeling. This is normal. . On occasion, you may experience a mild rash up to 24 hours after the test. This is not dangerous. If this occurs, you can take Benadryl 25 mg and increase your fluid intake. . If you experience trouble breathing, this can be serious. If it is severe call 911 IMMEDIATELY. If it is mild, please call our office. . If you take any of these medications: Glipizide/Metformin, Avandament, Glucavance, please do not take 48 hours after  completing test unless otherwise instructed.   Once we have confirmed authorization from your insurance company, we will call you to set up a date and time for your test.   For non-scheduling related questions, please contact the cardiac imaging nurse navigator should you have any questions/concerns: Marchia Bond, RN Navigator Cardiac Imaging [facility] [phone] Office        Signed, Sinclair Grooms, MD  04/01/2019 9:03 AM    [place]

## 2019-03-30 ENCOUNTER — Encounter: Payer: Self-pay | Admitting: Gastroenterology

## 2019-04-01 ENCOUNTER — Ambulatory Visit: Payer: Medicare Other | Admitting: Interventional Cardiology

## 2019-04-01 ENCOUNTER — Other Ambulatory Visit: Payer: Self-pay

## 2019-04-01 ENCOUNTER — Encounter: Payer: Self-pay | Admitting: Interventional Cardiology

## 2019-04-01 VITALS — BP 108/68 | HR 61 | Ht 63.0 in | Wt 249.8 lb

## 2019-04-01 DIAGNOSIS — Z7189 Other specified counseling: Secondary | ICD-10-CM

## 2019-04-01 DIAGNOSIS — I10 Essential (primary) hypertension: Secondary | ICD-10-CM | POA: Diagnosis not present

## 2019-04-01 DIAGNOSIS — I456 Pre-excitation syndrome: Secondary | ICD-10-CM

## 2019-04-01 DIAGNOSIS — R072 Precordial pain: Secondary | ICD-10-CM

## 2019-04-01 DIAGNOSIS — E785 Hyperlipidemia, unspecified: Secondary | ICD-10-CM

## 2019-04-01 DIAGNOSIS — R0602 Shortness of breath: Secondary | ICD-10-CM | POA: Diagnosis not present

## 2019-04-01 DIAGNOSIS — I2510 Atherosclerotic heart disease of native coronary artery without angina pectoris: Secondary | ICD-10-CM

## 2019-04-01 MED ORDER — PREDNISONE 50 MG PO TABS: ORAL_TABLET | ORAL | 0 refills | Status: DC

## 2019-04-01 NOTE — Patient Instructions (Addendum)
Medication Instructions:  Your physician recommends that you continue on your current medications as directed. Please refer to the Current Medication list given to you today.  *If you need a refill on your cardiac medications before your next appointment, please call your pharmacy*  Lab Work: You will need a BMET prior to your Coronary CT  If you have labs (blood work) drawn today and your tests are completely normal, you will receive your results only by: Marland Kitchen MyChart Message (if you have MyChart) OR . A paper copy in the mail If you have any lab test that is abnormal or we need to change your treatment, we will call you to review the results.  Testing/Procedures: Your physician recommends that you have a Coronary CT performed.   Follow-Up: At [facility], you and your health needs are our priority.  As part of our continuing mission to provide you with exceptional heart care, we have created designated Provider Care Teams.  These Care Teams include your primary Cardiologist (physician) and Advanced Practice Providers (APPs -  Physician Assistants and Nurse Practitioners) who all work together to provide you with the care you need, when you need it.  Your next appointment:   12 month(s)  The format for your next appointment:   In Person  Provider:   You may see Sinclair Grooms, MD or one of the following Advanced Practice Providers on your designated Care Team:    Truitt Merle, NP  Cecilie Kicks, NP  Kathyrn Drown, NP   Other Instructions  Your cardiac CT will be scheduled at one of the below locations:   [facility] [address] [place], [place] [address] [phone]  [place] [address] [place], [place] [address] [phone]  If scheduled at [facility], please arrive at the [facility] main entrance of [facility] 30-45 minutes prior to test start time. Proceed to the  [facility] Radiology Department (first floor) to check-in and test prep.  If scheduled at [facility], please arrive 15 mins early for check-in and test prep.  Please follow these instructions carefully (unless otherwise directed):   On the Night Before the Test: . Be sure to Drink plenty of water. . Do not consume any caffeinated/decaffeinated beverages or chocolate 12 hours prior to your test. . Do not take any antihistamines 12 hours prior to your test. . If the patient has contrast allergy: ? Patient will need a prescription for Prednisone and very clear instructions (as follows): 1. Prednisone 50 mg - take 13 hours prior to test 2. Take another Prednisone 50 mg 7 hours prior to test 3. Take another Prednisone 50 mg 1 hour prior to test 4. Take Benadryl 50 mg 1 hour prior to test . Patient must complete all four doses of above prophylactic medications. . Patient will need a ride after test due to Benadryl.  On the Day of the Test: . Drink plenty of water. Do not drink any water within one hour of the test. . Do not eat any food 4 hours prior to the test. . You may take your regular medications prior to the test.  . HOLD Furosemide/Hydrochlorothiazide morning of the test. . FEMALES- please wear underwire-free bra if available       After the Test: . Drink plenty of water. . After receiving IV contrast, you may experience a mild flushed feeling. This is normal. . On occasion,  you may experience a mild rash up to 24 hours after the test. This is not dangerous. If this occurs, you can take Benadryl 25 mg and increase your fluid intake. . If you experience trouble breathing, this can be serious. If it is severe call 911 IMMEDIATELY. If it is mild, please call our office. . If you take any of these medications: Glipizide/Metformin, Avandament, Glucavance, please do not take 48 hours after completing test unless otherwise instructed.   Once we have confirmed  authorization from your insurance company, we will call you to set up a date and time for your test.   For non-scheduling related questions, please contact the cardiac imaging nurse navigator should you have any questions/concerns: Marchia Bond, RN Navigator Cardiac Imaging [facility] [phone] Office

## 2019-05-15 ENCOUNTER — Other Ambulatory Visit: Payer: Self-pay

## 2019-05-15 ENCOUNTER — Other Ambulatory Visit: Payer: Medicare Other | Admitting: *Deleted

## 2019-05-15 DIAGNOSIS — Z01812 Encounter for preprocedural laboratory examination: Secondary | ICD-10-CM

## 2019-05-15 LAB — BASIC METABOLIC PANEL
BUN/Creatinine Ratio: 18 (ref 12–28)
BUN: 25 mg/dL (ref 8–27)
CO2: 22 mmol/L (ref 20–29)
Calcium: 9.8 mg/dL (ref 8.7–10.3)
Chloride: 103 mmol/L (ref 96–106)
Creatinine, Ser: 1.39 mg/dL — ABNORMAL HIGH (ref 0.57–1.00)
GFR calc Af Amer: 44 mL/min/{1.73_m2} — ABNORMAL LOW (ref >59)
GFR calc non Af Amer: 38 mL/min/{1.73_m2} — ABNORMAL LOW (ref >59)
Glucose: 121 mg/dL — ABNORMAL HIGH (ref 65–99)
Potassium: 4.3 mmol/L (ref 3.5–5.2)
Sodium: 139 mmol/L (ref 134–144)

## 2019-05-15 NOTE — Progress Notes (Unsigned)
bmet entered

## 2019-05-21 ENCOUNTER — Telehealth (HOSPITAL_COMMUNITY): Payer: Self-pay | Admitting: Emergency Medicine

## 2019-05-21 NOTE — Telephone Encounter (Signed)
Reaching out to patient to offer assistance regarding upcoming cardiac imaging study; pt verbalizes understanding of appt date/time, parking situation and where to check in, pre-test NPO status and medications ordered, and verified current allergies; name and call back number provided for further questions should they arise Marchia Bond RN Navigator Cardiac Imaging [facility] [phone] office [phone] cell   Pt informed of need for IV hydration related to latest kidney lab values. Pt instructed to arrive to radiology at 12 noon to being hydration protocol (no availability in med day/ procedural SS). Pt verbalized understanding.   We also reviewed the exact times to take PO prednisone prior to CCTA (12:30a, 6:30a, 12:30p which she will bring with her to radiology to take with benadryl). Pt also verbalized understanding of these instructions.   Pt denies further questions but encourage to call me back if something comes up

## 2019-05-22 ENCOUNTER — Other Ambulatory Visit: Payer: Self-pay

## 2019-05-22 ENCOUNTER — Encounter: Payer: Medicare Other | Admitting: *Deleted

## 2019-05-22 ENCOUNTER — Ambulatory Visit (HOSPITAL_COMMUNITY)
Admission: RE | Admit: 2019-05-22 | Discharge: 2019-05-22 | Disposition: A | Discharge status: Home / Self Care | Payer: Medicare Other | Source: Ambulatory Visit | Attending: Interventional Cardiology | Admitting: Interventional Cardiology

## 2019-05-22 DIAGNOSIS — Z006 Encounter for examination for normal comparison and control in clinical research program: Secondary | ICD-10-CM

## 2019-05-22 DIAGNOSIS — R072 Precordial pain: Secondary | ICD-10-CM | POA: Diagnosis present

## 2019-05-22 MED ORDER — NITROGLYCERIN 0.4 MG SL SUBL: 0.8000 mg | SUBLINGUAL_TABLET | Freq: Once | SUBLINGUAL | Status: DC

## 2019-05-22 MED ORDER — NITROGLYCERIN 0.4 MG SL SUBL
SUBLINGUAL_TABLET | SUBLINGUAL | Status: AC
  Filled 2019-05-22: qty 2

## 2019-05-22 MED ORDER — SODIUM CHLORIDE 0.9 % WEIGHT BASED INFUSION
3.0000 mL/kg/h | INTRAVENOUS | Status: AC
  Administered 2019-05-22: 13:00:00 3 mL/kg/h via INTRAVENOUS

## 2019-05-22 MED ORDER — SODIUM CHLORIDE 0.9 % WEIGHT BASED INFUSION: 1.0000 mL/kg/h | INTRAVENOUS | Status: DC

## 2019-05-22 MED ORDER — IOHEXOL 350 MG/ML SOLN
80.0000 mL | Freq: Once | INTRAVENOUS | Status: AC | PRN
  Administered 2019-05-22: 80 mL via INTRAVENOUS

## 2019-05-22 MED ORDER — NITROGLYCERIN 0.4 MG SL SUBL
0.8000 mg | SUBLINGUAL_TABLET | Freq: Once | SUBLINGUAL | Status: AC
  Administered 2019-05-22: 14:00:00 0.8 mg via SUBLINGUAL

## 2019-05-22 NOTE — Research (Signed)
Subject Name: [name]  Subject met inclusion and exclusion criteria.  The informed consent form, study requirements and expectations were reviewed with the subject and questions and concerns were addressed prior to the signing of the consent form.  The subject verbalized understanding of the trial requirements.  The subject agreed to participate in the CADFEM Group 4 trial and signed the informed consent at 11:44 on 05/22/19  The informed consent was obtained prior to performance of any protocol-specific procedures for the subject.  A copy of the signed informed consent was given to the subject and a copy was placed in the subject's medical record.   [name]

## 2019-05-22 NOTE — Telephone Encounter (Signed)
Thanks

## 2019-05-29 ENCOUNTER — Ambulatory Visit: Payer: Medicare Other

## 2019-06-06 ENCOUNTER — Ambulatory Visit: Payer: Medicare Other | Attending: Internal Medicine

## 2019-06-06 DIAGNOSIS — Z23 Encounter for immunization: Secondary | ICD-10-CM

## 2019-06-06 NOTE — Progress Notes (Signed)
   Covid-19 Vaccination Clinic  Name:  [name]    MRN: [id number] DOB: 1945-10-02  06/06/2019  [name] was observed post Covid-19 immunization for 15 minutes without incidence. She was provided with Vaccine Information Sheet and instruction to access the V-Safe system.   [name] was instructed to call 911 with any severe reactions post vaccine: Marland Kitchen Difficulty breathing  . Swelling of your face and throat  . A fast heartbeat  . A bad rash all over your body  . Dizziness and weakness    Immunizations Administered    Name Date Dose VIS Date Route   Pfizer COVID-19 Vaccine 06/06/2019  2:51 PM 0.3 mL 04/10/2019 Intramuscular   Manufacturer: [place]   Lot: [id number]   [place]: [id number]

## 2019-06-19 ENCOUNTER — Ambulatory Visit: Payer: Medicare Other

## 2019-07-01 ENCOUNTER — Ambulatory Visit: Payer: Medicare Other | Attending: Internal Medicine

## 2019-07-01 DIAGNOSIS — Z23 Encounter for immunization: Secondary | ICD-10-CM

## 2019-07-01 NOTE — Progress Notes (Signed)
   Covid-19 Vaccination Clinic  Name:  [name]    MRN: [id number] DOB: 04/12/1946  07/01/2019  [name] was observed post Covid-19 immunization for 15 minutes without incident. She was provided with Vaccine Information Sheet and instruction to access the V-Safe system.   [name] was instructed to call 911 with any severe reactions post vaccine: Marland Kitchen Difficulty breathing  . Swelling of face and throat  . A fast heartbeat  . A bad rash all over body  . Dizziness and weakness   Immunizations Administered    Name Date Dose VIS Date Route   Pfizer COVID-19 Vaccine 07/01/2019  2:51 PM 0.3 mL 04/10/2019 Intramuscular   Manufacturer: [place]   Lot: [id number]   [place]: [id number]

## 2019-10-14 ENCOUNTER — Encounter: Payer: Self-pay | Admitting: Gastroenterology

## 2019-12-01 ENCOUNTER — Ambulatory Visit (AMBULATORY_SURGERY_CENTER): Payer: Self-pay | Admitting: *Deleted

## 2019-12-01 ENCOUNTER — Encounter: Payer: Self-pay | Admitting: Gastroenterology

## 2019-12-01 ENCOUNTER — Other Ambulatory Visit: Payer: Self-pay

## 2019-12-01 VITALS — Ht 63.0 in | Wt 242.6 lb

## 2019-12-01 DIAGNOSIS — Z1211 Encounter for screening for malignant neoplasm of colon: Secondary | ICD-10-CM

## 2019-12-01 MED ORDER — SUTAB 1479-225-188 MG PO TABS: 1.0000 | ORAL_TABLET | Freq: Once | ORAL | 0 refills | Status: AC

## 2019-12-01 NOTE — Progress Notes (Signed)
Pt is no longer on research medication  Completed covid vaccines 07-01-19  Pt is aware that care partner will wait in the car during procedure; if they feel like they will be too hot or cold to wait in the car; they may wait in the 4 th floor lobby. Patient is aware to bring only one care partner. We want them to wear a mask (we do not have any that we can provide them), practice social distancing, and we will check their temperatures when they get here.  I did remind the patient that their care partner needs to stay in the parking lot the entire time and have a cell phone available, we will call them when the pt is ready for discharge. Patient will wear mask into building.   Pt does have a hx of PONV, difficulty with intubation or hx/fam hx of malignant hyperthermia per pt   No egg or soy allergy  No home oxygen use   No medications for weight loss taken  Pt denies constipation issues   Sutab code put into RX and paper copy given to pt to show pharmacy

## 2019-12-15 ENCOUNTER — Ambulatory Visit (AMBULATORY_SURGERY_CENTER): Payer: Medicare Other | Admitting: Gastroenterology

## 2019-12-15 ENCOUNTER — Other Ambulatory Visit: Payer: Self-pay

## 2019-12-15 ENCOUNTER — Encounter: Payer: Self-pay | Admitting: Gastroenterology

## 2019-12-15 VITALS — BP 102/69 | HR 62 | Temp 96.8°F | Resp 19 | Ht 63.0 in | Wt 242.0 lb

## 2019-12-15 DIAGNOSIS — R197 Diarrhea, unspecified: Secondary | ICD-10-CM | POA: Diagnosis not present

## 2019-12-15 DIAGNOSIS — D122 Benign neoplasm of ascending colon: Secondary | ICD-10-CM | POA: Diagnosis not present

## 2019-12-15 DIAGNOSIS — K5730 Diverticulosis of large intestine without perforation or abscess without bleeding: Secondary | ICD-10-CM | POA: Diagnosis not present

## 2019-12-15 DIAGNOSIS — Z1211 Encounter for screening for malignant neoplasm of colon: Secondary | ICD-10-CM

## 2019-12-15 MED ORDER — SODIUM CHLORIDE 0.9 % IV SOLN: 500.0000 mL | Freq: Once | INTRAVENOUS | Status: AC

## 2019-12-15 MED ORDER — DICYCLOMINE HCL 10 MG PO CAPS: 10.0000 mg | ORAL_CAPSULE | Freq: Three times a day (TID) | ORAL | 11 refills | Status: DC

## 2019-12-15 NOTE — Progress Notes (Signed)
Pt's states no medical or surgical changes since previsit or office visit.  Kismet vitals sand AG IV.

## 2019-12-15 NOTE — Progress Notes (Signed)
Called to room to assist during endoscopic procedure.  Patient ID and intended procedure confirmed with present staff. Received instructions for my participation in the procedure from the performing physician.  

## 2019-12-15 NOTE — Progress Notes (Signed)
pt tolerated well. VSS. awake and to recovery. Report given to RN.  

## 2019-12-15 NOTE — Op Note (Signed)
[facility] Patient Name: [name] Procedure Date: 12/15/2019 9:47 AM MRN: [id number] Endoscopist: Ladene Artist , MD Age: 74 Referring MD:  Date of Birth: 03-Dec-1945 Gender: Female Account #: [id number] Procedure:                Colonoscopy Indications:              Chronic diarrhea Medicines:                Monitored Anesthesia Care Procedure:                Pre-Anesthesia Assessment:                           - Prior to the procedure, a History and Physical                            was performed, and patient medications and                            allergies were reviewed. The patient's tolerance of                            previous anesthesia was also reviewed. The risks                            and benefits of the procedure and the sedation                            options and risks were discussed with the patient.                            All questions were answered, and informed consent                            was obtained. Prior Anticoagulants: The patient has                            taken no previous anticoagulant or antiplatelet                            agents. ASA Grade Assessment: III - A patient with                            severe systemic disease. After reviewing the risks                            and benefits, the patient was deemed in                            satisfactory condition to undergo the procedure.                           After obtaining informed consent, the colonoscope  was passed under direct vision. Throughout the                            procedure, the patient's blood pressure, pulse, and                            oxygen saturations were monitored continuously. The                            Colonoscope was introduced through the anus and                            advanced to the the terminal ileum, with                            identification of the appendiceal orifice and IC                             valve. The terminal ileum, ileocecal valve,                            appendiceal orifice, and rectum were photographed.                            The quality of the bowel preparation was good. The                            colonoscopy was performed without difficulty. The                            patient tolerated the procedure well. Scope In: 9:57:16 AM Scope Out: 10:12:34 AM Scope Withdrawal Time: 0 hours 11 minutes 57 seconds  Total Procedure Duration: 0 hours 15 minutes 18 seconds  Findings:                 The perianal and digital rectal examinations were                            normal.                           The terminal ileum contained a few small                            diverticula, otherwise appeared normal.                           A 7 mm polyp was found in the ascending colon. The                            polyp was sessile. The polyp was removed with a                            cold snare. Resection and retrieval were complete.  Scattered medium-mouthed diverticula were found in                            the right colon. There was no evidence of                            diverticular bleeding.                           Multiple medium-mouthed diverticula were found in                            the left colon. There was evidence of diverticular                            spasm and no evidence of diverticular bleeding.                           The exam was otherwise without abnormality on                            direct and retroflexion views. Random biopsies                            obtained. Complications:            No immediate complications. Estimated blood loss:                            None. Estimated Blood Loss:     Estimated blood loss: none. Impression:               - Ileal diverticula, otherwise normal appearing.                           - One 7 mm polyp in the ascending colon, removed                             with a cold snare. Resected and retrieved.                           - Mild diverticulosis in the right colon.                           - Moderate diverticulosis in the left colon.                           - The examination was otherwise normal on direct                            and retroflexion views. Random biospies obtained. Recommendation:           - Repeat colonoscopy after studies are complete for                            surveillance based on pathology results.                           -  Patient has a contact number available for                            emergencies. The signs and symptoms of potential                            delayed complications were discussed with the                            patient. Return to normal activities tomorrow.                            Written discharge instructions were provided to the                            patient.                           - Resume previous diet.                           - Continue present medications.                           - Dicyclomine 10 mg po tid ac, 1 year of refills.                           - Await pathology results.                           - Return to GI office in 6 weeks. Ladene Artist, MD 12/15/2019 10:24:36 AM This report has been signed electronically.

## 2019-12-15 NOTE — Patient Instructions (Signed)
1 polyp removed and sent to pathology.  Mild diverticulosis.  Dicyclomine sent to your pharmacy. Resume previous medications.  Await pathology for final recommendations.  Handouts on findings given to patient.   GI office visit in 6 weeks.  The office will call you to schedule.     YOU HAD AN ENDOSCOPIC PROCEDURE TODAY AT [facility]:   Refer to the procedure report that was given to you for any specific questions about what was found during the examination.  If the procedure report does not answer your questions, please call your gastroenterologist to clarify.  If you requested that your care partner not be given the details of your procedure findings, then the procedure report has been included in a sealed envelope for you to review at your convenience later.  YOU SHOULD EXPECT: Some feelings of bloating in the abdomen. Passage of more gas than usual.  Walking can help get rid of the air that was put into your GI tract during the procedure and reduce the bloating. If you had a lower endoscopy (such as a colonoscopy or flexible sigmoidoscopy) you may notice spotting of blood in your stool or on the toilet paper. If you underwent a bowel prep for your procedure, you may not have a normal bowel movement for a few days.  Please Note:  You might notice some irritation and congestion in your nose or some drainage.  This is from the oxygen used during your procedure.  There is no need for concern and it should clear up in a day or so.  SYMPTOMS TO REPORT IMMEDIATELY:   Following lower endoscopy (colonoscopy or flexible sigmoidoscopy):  Excessive amounts of blood in the stool  Significant tenderness or worsening of abdominal pains  Swelling of the abdomen that is new, acute  Fever of 100F or higher   For urgent or emergent issues, a gastroenterologist can be reached at any hour by calling [phone]. Do not use MyChart messaging for urgent concerns.    DIET:  We do  recommend a small meal at first, but then you may proceed to your regular diet.  Drink plenty of fluids but you should avoid alcoholic beverages for 24 hours.  ACTIVITY:  You should plan to take it easy for the rest of today and you should NOT DRIVE or use heavy machinery until tomorrow (because of the sedation medicines used during the test).    FOLLOW UP: Our staff will call the number listed on your records 48-72 hours following your procedure to check on you and address any questions or concerns that you may have regarding the information given to you following your procedure. If we do not reach you, we will leave a message.  We will attempt to reach you two times.  During this call, we will ask if you have developed any symptoms of COVID 19. If you develop any symptoms (ie: fever, flu-like symptoms, shortness of breath, cough etc.) before then, please call [phone].  If you test positive for Covid 19 in the 2 weeks post procedure, please call and report this information to [place].    If any biopsies were taken you will be contacted by phone or by letter within the next 1-3 weeks.  Please call us at [phone] if you have not heard about the biopsies in 3 weeks.    SIGNATURES/CONFIDENTIALITY: You and/or your care partner have signed paperwork which will be entered into your electronic medical record.  These signatures attest to  the fact that that the information above on your After Visit Summary has been reviewed and is understood.  Full responsibility of the confidentiality of this discharge information lies with you and/or your care-partner.

## 2019-12-17 ENCOUNTER — Telehealth: Payer: Self-pay

## 2019-12-17 NOTE — Telephone Encounter (Signed)
  Follow up Call-  Call back number 12/15/2019  Post procedure Call Back phone  # [phone]  Permission to leave phone message Yes  Some recent data might be hidden     Patient questions:  Do you have a fever, pain , or abdominal swelling? No. Pain Score  0 *  Have you tolerated food without any problems? Yes.    Have you been able to return to your normal activities? Yes.    Do you have any questions about your discharge instructions: Diet   No. Medications  No. Follow up visit  No.  Do you have questions or concerns about your Care? No.  Actions: * If pain score is 4 or above: No action needed, pain <4.  1. Have you developed a fever since your procedure? no  2.   Have you had an respiratory symptoms (SOB or cough) since your procedure? no  3.   Have you tested positive for COVID 19 since your procedure no  4.   Have you had any family members/close contacts diagnosed with the COVID 19 since your procedure?  no   If yes to any of these questions please route to Joylene John, RN and Joella Prince, RN

## 2019-12-21 ENCOUNTER — Encounter: Payer: Self-pay | Admitting: Gastroenterology

## 2020-01-05 ENCOUNTER — Other Ambulatory Visit: Payer: Self-pay

## 2020-01-05 ENCOUNTER — Ambulatory Visit: Payer: Medicare Other | Admitting: Podiatry

## 2020-01-05 DIAGNOSIS — L600 Ingrowing nail: Secondary | ICD-10-CM | POA: Diagnosis not present

## 2020-01-05 NOTE — Patient Instructions (Signed)

## 2020-01-07 ENCOUNTER — Telehealth: Payer: Self-pay | Admitting: Podiatry

## 2020-01-07 MED ORDER — NEOMYCIN-POLYMYXIN-HC 3.5-10000-1 OT SUSP: OTIC | 0 refills | Status: DC

## 2020-01-07 NOTE — Telephone Encounter (Signed)
Pt was prescribed an ointment for ingrown toe nail. It was sent to [facility] outpatient, she called and they haven't received it. She would like for it to be sent to [facility] on [address]. Please advise.

## 2020-01-07 NOTE — Addendum Note (Signed)
Addended bySherryle Lis, Zohaib Heeney R on: 01/07/2020 09:01 AM   Modules accepted: Orders

## 2020-01-07 NOTE — Progress Notes (Signed)
  Subjective:  Patient ID: [name], female    DOB: 1946/03/20,  MRN: [id number]  Chief Complaint  Patient presents with  . Ingrown Toenail    (NP) Bilateral ingrown great toenails    74 y.o. female presents with the above complaint. History confirmed with patient.  Both are painful but the right is the most bothersome.  She is a well-controlled diabetic with A1c of 6.5%  Objective:  Physical Exam: warm, good capillary refill, no trophic changes or ulcerative lesions, normal DP and PT pulses and normal sensory exam. Left Foot: Ingrown toenail hallux without paronychia Right Foot: Ingrown toenail hallux without paronychia   Assessment:   1. Ingrowing left great toenail   2. Ingrowing right great toenail      Plan:  Patient was evaluated and treated and all questions answered.     Ingrown Nail, bilaterally -Patient elects to proceed with minor surgery to remove ingrown toenail today. Consent reviewed and signed by patient. -Ingrown nail excised. See procedure note. -Educated on post-procedure care including soaking. Written instructions provided and reviewed. -Patient to follow up in 2 weeks for nail check.  Procedure: Excision of Ingrown Toenail Location: Bilateral 1st toe  Anesthesia: Lidocaine 1% plain; 1.5 mL and Marcaine 0.5% plain; 1.5 mL, digital block. Skin Prep: Betadine. Dressing: Silvadene; telfa; dry, sterile, compression dressing. Technique: Following skin prep, the toe was exsanguinated and a tourniquet was secured at the base of the toe. The affected nail border was freed, split with a nail splitter, and excised. Chemical matrixectomy was then performed with phenol and irrigated out with alcohol. The tourniquet was then removed and sterile dressing applied. Disposition: Patient tolerated procedure well. Patient to return in 2 weeks for follow-up.     Return in about 2 weeks (around 01/19/2020) for right nail check.

## 2020-01-07 NOTE — Telephone Encounter (Signed)
Sent to [facility] on [place]

## 2020-01-19 ENCOUNTER — Ambulatory Visit (INDEPENDENT_AMBULATORY_CARE_PROVIDER_SITE_OTHER): Payer: Medicare Other | Admitting: Podiatry

## 2020-01-19 ENCOUNTER — Other Ambulatory Visit: Payer: Self-pay

## 2020-01-19 DIAGNOSIS — Z9889 Other specified postprocedural states: Secondary | ICD-10-CM

## 2020-01-19 DIAGNOSIS — L600 Ingrowing nail: Secondary | ICD-10-CM | POA: Diagnosis not present

## 2020-01-19 NOTE — Progress Notes (Signed)
  Subjective:  Patient ID: [name], female    DOB: December 23, 1945,  MRN: [id number]  Chief Complaint  Patient presents with  . Follow-up    engrown toenail great toe right foot    74 y.o. female returns for follow-up of nail with the above complaint. History confirmed with patient.  Healing well she has been leaving open to air.  Objective:  Physical Exam: warm, good capillary refill, no trophic changes or ulcerative lesions, normal DP and PT pulses and normal sensory exam. Nail avulsion sites healing well w/o complication no infection   Assessment:   No diagnosis found.   Plan:  Patient was evaluated and treated and all questions answered.     Ingrown Nail, bilaterally -Healing well, can leave OTA, and d/c soaks and ointment    Return if symptoms worsen or fail to improve.

## 2020-02-12 ENCOUNTER — Other Ambulatory Visit: Payer: Self-pay | Admitting: Internal Medicine

## 2020-02-12 DIAGNOSIS — Z1231 Encounter for screening mammogram for malignant neoplasm of breast: Secondary | ICD-10-CM

## 2020-02-27 ENCOUNTER — Ambulatory Visit: Payer: Medicare Other | Attending: Internal Medicine

## 2020-02-27 DIAGNOSIS — Z23 Encounter for immunization: Secondary | ICD-10-CM

## 2020-02-27 NOTE — Progress Notes (Signed)
   Covid-19 Vaccination Clinic  Name:  [name]    MRN: [id number] DOB: 02-13-1946  02/27/2020  [name] was observed post Covid-19 immunization for 15 minutes without incident. She was provided with Vaccine Information Sheet and instruction to access the V-Safe system.   [name] was instructed to call 911 with any severe reactions post vaccine: Marland Kitchen Difficulty breathing  . Swelling of face and throat  . A fast heartbeat  . A bad rash all over body  . Dizziness and weakness

## 2020-03-01 ENCOUNTER — Ambulatory Visit: Payer: Medicare Other

## 2020-03-12 ENCOUNTER — Ambulatory Visit: Payer: Medicare Other

## 2020-04-08 ENCOUNTER — Other Ambulatory Visit: Payer: Self-pay | Admitting: Neurosurgery

## 2020-04-08 ENCOUNTER — Other Ambulatory Visit (HOSPITAL_COMMUNITY): Payer: Self-pay | Admitting: Neurosurgery

## 2020-04-08 DIAGNOSIS — Z6841 Body Mass Index (BMI) 40.0 and over, adult: Secondary | ICD-10-CM

## 2020-04-08 DIAGNOSIS — G8929 Other chronic pain: Secondary | ICD-10-CM

## 2020-04-08 DIAGNOSIS — I10 Essential (primary) hypertension: Secondary | ICD-10-CM

## 2020-04-11 ENCOUNTER — Ambulatory Visit
Admission: RE | Admit: 2020-04-11 | Discharge: 2020-04-11 | Disposition: A | Discharge status: Home / Self Care | Payer: Medicare Other | Source: Ambulatory Visit | Attending: Internal Medicine | Admitting: Internal Medicine

## 2020-04-11 ENCOUNTER — Other Ambulatory Visit: Payer: Self-pay

## 2020-04-11 DIAGNOSIS — Z1231 Encounter for screening mammogram for malignant neoplasm of breast: Secondary | ICD-10-CM

## 2020-04-19 ENCOUNTER — Encounter (HOSPITAL_COMMUNITY): Payer: Self-pay

## 2020-04-19 ENCOUNTER — Ambulatory Visit (HOSPITAL_COMMUNITY): Payer: Medicare Other

## 2020-05-02 NOTE — Progress Notes (Signed)
Cardiology Office Note:    Date:  05/06/2020   ID:  [name], DOB [birth year], MRN [id number]  PCP:  Leanna Battles, MD  Cardiologist:  Sinclair Grooms, MD   Referring MD: Leanna Battles, MD   Chief Complaint  Patient presents with  . Hypertension  . Coronary Artery Disease    History of Present Illness:     [name] is a 75 y.o. female with a hx of near syncope, atypical chest pain, WPW (asymptomatic),controlled type 2 diabetes mellitus,and palpitations.   No chest discomfort.  Has headaches from time to time.  Compliant with medications.  No shortness of breath.  Past Medical History:  Diagnosis Date  . Acute blood loss as cause of postoperative anemia 04/03/2016  . Anemia    hx of  . Anxiety    takes Xanax daily as needed  . Arthritis   . Bronchitis    a month ago  . Chronic back pain    stenosis  . Chronic kidney disease    stage 3-sees Dr.J Patel  . Class 3 obesity due to excess calories with body mass index (BMI) of 40.0 to 44.9 in adult   . Diabetes mellitus    type II; takes Byetta and Invokana daily  . Glaucoma    uses Eye Drops  . Gout    takes Uloric daily  . History of blood transfusion    no abnormal reaction   . History of colon polyps   . Hyperlipidemia    no meds taken now  . Hypertension    takes HCTZ daily as well as Aldactone  . Hypothyroidism    takes Synthroid daily  . Pneumonia    many yrs ago  . PONV (postoperative nausea and vomiting)   . Primary localized osteoarthritis of right knee   . Stress incontinence   . Syncope   . Weakness    numbness and tingling in right hip/eg/  . Wears glasses   . Wolf-Parkinson-White syndrome    takes Atenolol daily    Past Surgical History:  Procedure Laterality Date  . ABDOMINAL HYSTERECTOMY    . ANKLE ARTHROPLASTY  2010   lt - pt states not arthroplasty but arthroscopy  . APPENDECTOMY    . BACK SURGERY  2006   lumb fusion  . BACK SURGERY  08/16/2014   L3-4 fusion  .  BREAST BIOPSY    . BREAST ENHANCEMENT SURGERY Bilateral   . BREAST EXCISIONAL BIOPSY Left    benign  . CARDIAC CATHETERIZATION  >51yrs ago  . CARPAL TUNNEL RELEASE Bilateral    x4  . cataract surgery Bilateral   . CESAREAN SECTION     x 3  . CHOLECYSTECTOMY  1981  . COLONOSCOPY    . KNEE ARTHROSCOPY  2009   rt  . RAZ procedure  10+yrs ago  . REDUCTION MAMMAPLASTY Bilateral 2006  . ROTATOR CUFF REPAIR Right    x 4  . SHOULDER ARTHROSCOPY WITH ROTATOR CUFF REPAIR AND SUBACROMIAL DECOMPRESSION Right 06/10/2012   Procedure: SHOULDER ARTHROSCOPY WITH ROTATOR CUFF REPAIR AND SUBACROMIAL DECOMPRESSION;  Surgeon: Lorn Junes, MD;  Location: [facility];  Service: Orthopedics;  Laterality: Right;  RIGHT SHOULDER ARTHROSCOPY, DECOMPRESSION SUBACROMIAL PARTIAL ACROMIOPLASTY WITH CORACOACROMIAL RELEASE,, DISTAL CLAVICULECTOMY , ROTATOR CUFF debriedment  . SKIN BIOPSY    . THYROID SURGERY    . TOTAL KNEE ARTHROPLASTY Right 04/02/2016   Procedure: RIGHT TOTAL KNEE ARTHROPLASTY;  Surgeon: Elsie Saas, MD;  Location: [facility];  Service: Orthopedics;  Laterality: Right;    Current Medications: Current Meds  Medication Sig  . ALPRAZolam (XANAX) 0.5 MG tablet Take 0.5 mg by mouth 3 (three) times daily as needed for anxiety.   Marland Kitchen aspirin 81 MG chewable tablet Chew 81 mg by mouth daily.  Marland Kitchen atenolol (TENORMIN) 25 MG tablet Take 25 mg by mouth daily.  . Cholecalciferol (VITAMIN D3) 50 MCG (2000 UT) TABS Take 2,000 Units by mouth daily.  Marland Kitchen dicyclomine (BENTYL) 10 MG capsule Take 1 capsule (10 mg total) by mouth 3 (three) times daily before meals.  . febuxostat (ULORIC) 40 MG tablet Take 40 mg by mouth daily.   . furosemide (LASIX) 20 MG tablet Take 20 mg by mouth daily.  Marland Kitchen latanoprost (XALATAN) 0.005 % ophthalmic solution Place 1 drop into both eyes at bedtime.  Marland Kitchen levothyroxine (SYNTHROID, LEVOTHROID) 50 MCG tablet Take 50-100 mcg by mouth See admin instructions. Take 50 mcg daily, except  100 mcg by mouth on Sunday  . pravastatin (PRAVACHOL) 20 MG tablet Take 20 mg by mouth every evening.  Marland Kitchen telmisartan (MICARDIS) 20 MG tablet Take 20 mg by mouth daily.  . TRULICITY [id number] SOPN Inject 0.75 mg into the skin every Sunday.   Current Facility-Administered Medications for the 05/06/20 encounter (Office Visit) with Belva Crome, MD  Medication  . 0.9 %  sodium chloride infusion     Allergies:   Contrast media [iodinated diagnostic agents], Adhesive [tape], Esomeprazole magnesium, Morphine and related, Talwin [pentazocine], Allopurinol, Codeine, Colchicine, Hydrocodone, Meperidine hcl, Oxycodone, and Pentazocine lactate   Social History   Socioeconomic History  . Marital status: Single    Spouse name: Not on file  . Number of children: Not on file  . Years of education: Not on file  . Highest education level: Not on file  Occupational History  . Not on file  Tobacco Use  . Smoking status: Former Research scientist (life sciences)  . Smokeless tobacco: Never Used  . Tobacco comment: quit smoking 65yrs ago  Vaping Use  . Vaping Use: Never used  Substance and Sexual Activity  . Alcohol use: No  . Drug use: No  . Sexual activity: Not on file  Other Topics Concern  . Not on file  Social History Narrative  . Not on file   Social Determinants of Health   Financial Resource Strain: Not on file  Food Insecurity: Not on file  Transportation Needs: Not on file  Physical Activity: Not on file  Stress: Not on file  Social Connections: Not on file     Family History: The patient's family history includes Arthritis in an other family member; Diabetes in her mother; Heart attack in her father; Hypertension in an other family member. There is no history of Breast cancer, Colon cancer, Esophageal cancer, Rectal cancer, or Stomach cancer.  ROS:   Please see the history of present illness.    Measured blood pressures at home tend to run above 140/80.  Compliant with her current medication  regimen.  All other systems reviewed and are negative.  EKGs/Labs/Other Studies Reviewed:    The following studies were reviewed today: Coronary calcium score/CTA: Performed January 2021 IMPRESSION: 1. Coronary calcium score of 62. This was 51 percentile for age and sex matched control.  2. Normal coronary origin with right dominance.  3. Minimal (0-24%) calcified plaque in the proximal LAD and proximal and distal RCA; CADRADS-1.   EKG:  EKG sinus bradycardia, 57 bpm.  Otherwise normal.  Recent Labs: 05/15/2019: BUN 25; Creatinine, Ser 1.39; Potassium 4.3; Sodium 139  Recent Lipid Panel No results found for: CHOL, TRIG, HDL, CHOLHDL, VLDL, LDLCALC, LDLDIRECT  Physical Exam:    VS:  BP (!) 148/78   Pulse (!) 57   Ht 5\' 3"  (1.6 m)   Wt 240 lb 9.6 oz (109.1 kg)   SpO2 96%   BMI 42.62 kg/m     Wt Readings from Last 3 Encounters:  05/06/20 240 lb 9.6 oz (109.1 kg)  12/15/19 242 lb (109.8 kg)  12/01/19 242 lb 9.6 oz (110 kg)     GEN: Morbid with BMI 43.. No acute distress HEENT: Normal NECK: No JVD. LYMPHATICS: No lymphadenopathy CARDIAC: No murmur. RRR S4 but no S3 gallop, or edema. VASCULAR:  Normal Pulses. No bruits. RESPIRATORY:  Clear to auscultation without rales, wheezing or rhonchi  ABDOMEN: Soft, non-tender, non-distended, No pulsatile mass, MUSCULOSKELETAL: No deformity  SKIN: Warm and dry NEUROLOGIC:  Alert and oriented x 3 PSYCHIATRIC:  Normal affect   ASSESSMENT:    1. Atherosclerosis of native coronary artery of native heart without angina pectoris   2. WOLFF (WOLFE)-PARKINSON-WHITE (WPW) SYNDROME   3. Essential hypertension   4. Hyperlipidemia LDL goal <70   5. Educated about COVID-19 virus infection    PLAN:    In order of problems listed above:  1. Stable without angina.  Risk factor modification to prevent development of symptomatic disease.  Blood pressure is a major issue currently. 2. No manifestations of preexcitation  syndrome. 3. Elevated blood pressure.  Target 130/80.  2 g sodium diet.  Avoid nonsteroidals.  Does not drink alcohol.  Measure blood pressures at home and if consistently above 140/80 will add amlodipine 5 mg/day at night.  She states her early morning blood pressures are the highest.  May need to consider sleep apnea.  Continue Micardis, Lasix, and Tenormin. 4. Continue Pravachol 20 mg/day. 5. Vaccinated and practicing social mitigation.  Overall education and awareness concerning primary/secondary risk prevention was discussed in detail: LDL less than 70, hemoglobin A1c less than 7, blood pressure target less than 130/80 mmHg, >150 minutes of moderate aerobic activity per week, avoidance of smoking, weight control (via diet and exercise), and continued surveillance/management of/for obstructive sleep apnea.   Medication Adjustments/Labs and Tests Ordered: Current medicines are reviewed at length with the patient today.  Concerns regarding medicines are outlined above.  Orders Placed This Encounter  Procedures  . EKG 12-Lead   No orders of the defined types were placed in this encounter.   There are no Patient Instructions on file for this visit.   Signed, 01/31/20, MD  05/06/2020 4:06 PM    [facility]

## 2020-05-06 ENCOUNTER — Ambulatory Visit: Payer: Medicare Other | Admitting: Interventional Cardiology

## 2020-05-06 ENCOUNTER — Encounter: Payer: Self-pay | Admitting: Interventional Cardiology

## 2020-05-06 ENCOUNTER — Other Ambulatory Visit: Payer: Self-pay

## 2020-05-06 VITALS — BP 148/78 | HR 57 | Ht 63.0 in | Wt 240.6 lb

## 2020-05-06 DIAGNOSIS — I10 Essential (primary) hypertension: Secondary | ICD-10-CM

## 2020-05-06 DIAGNOSIS — I456 Pre-excitation syndrome: Secondary | ICD-10-CM | POA: Diagnosis not present

## 2020-05-06 DIAGNOSIS — E785 Hyperlipidemia, unspecified: Secondary | ICD-10-CM | POA: Diagnosis not present

## 2020-05-06 DIAGNOSIS — I2510 Atherosclerotic heart disease of native coronary artery without angina pectoris: Secondary | ICD-10-CM | POA: Diagnosis not present

## 2020-05-06 DIAGNOSIS — Z7189 Other specified counseling: Secondary | ICD-10-CM

## 2020-05-06 NOTE — Patient Instructions (Signed)
Medication Instructions:  Your physician recommends that you continue on your current medications as directed. Please refer to the Current Medication list given to you today.  *If you need a refill on your cardiac medications before your next appointment, please call your pharmacy*   Lab Work: None If you have labs (blood work) drawn today and your tests are completely normal, you will receive your results only by: Marland Kitchen MyChart Message (if you have MyChart) OR . A paper copy in the mail If you have any lab test that is abnormal or we need to change your treatment, we will call you to review the results.   Testing/Procedures: None   Follow-Up: At [facility], you and your health needs are our priority.  As part of our continuing mission to provide you with exceptional heart care, we have created designated Provider Care Teams.  These Care Teams include your primary Cardiologist (physician) and Advanced Practice Providers (APPs -  Physician Assistants and Nurse Practitioners) who all work together to provide you with the care you need, when you need it.  We recommend signing up for the patient portal called "MyChart".  Sign up information is provided on this After Visit Summary.  MyChart is used to connect with patients for Virtual Visits (Telemedicine).  Patients are able to view lab/test results, encounter notes, upcoming appointments, etc.  Non-urgent messages can be sent to your provider as well.   To learn more about what you can do with MyChart, go to [email or web address].    Your next appointment:   1 year(s)  The format for your next appointment:   In Person  Provider:   You may see Sinclair Grooms, MD or one of the following Advanced Practice Providers on your designated Care Team:    Truitt Merle, NP  Cecilie Kicks, NP  Kathyrn Drown, NP    Other Instructions  Monitor your blood pressure 3 times per week for the next 2 weeks and call us with those readings.   Dr. Tamala Julian will review them at that time and decide if we need to make any changes to your medications.

## 2020-05-09 ENCOUNTER — Ambulatory Visit (HOSPITAL_COMMUNITY)
Admission: RE | Admit: 2020-05-09 | Discharge: 2020-05-09 | Disposition: A | Discharge status: Home / Self Care | Payer: Medicare Other | Source: Ambulatory Visit | Attending: Neurosurgery | Admitting: Neurosurgery

## 2020-05-09 DIAGNOSIS — Z20822 Contact with and (suspected) exposure to covid-19: Secondary | ICD-10-CM | POA: Insufficient documentation

## 2020-05-09 DIAGNOSIS — Z01812 Encounter for preprocedural laboratory examination: Secondary | ICD-10-CM | POA: Diagnosis present

## 2020-05-09 LAB — SARS CORONAVIRUS 2 (TAT 6-24 HRS): SARS Coronavirus 2: NEGATIVE

## 2020-05-10 ENCOUNTER — Other Ambulatory Visit: Payer: Self-pay

## 2020-05-10 ENCOUNTER — Encounter (HOSPITAL_COMMUNITY): Payer: Self-pay

## 2020-05-10 NOTE — Progress Notes (Signed)
SDW Call-  PCP - Dr. Leanna Battles Cardiologist - Dr. Daneen Schick  Chest x-ray - N/A EKG - 05/06/20 Stress Test - 04/22/18 ECHO - 12/06/15 Cardiac Cath - denies  Pt instructed to arrive to surgical short stay at 0730 05/12/20. Instructions    Patient denies shortness of breath, fever, cough and chest pain. Covid test 05/09/20 negative. Pt educated to continue quarantining until [employer].

## 2020-05-12 ENCOUNTER — Ambulatory Visit (HOSPITAL_COMMUNITY): Payer: Medicare Other | Admitting: Certified Registered Nurse Anesthetist

## 2020-05-12 ENCOUNTER — Ambulatory Visit (HOSPITAL_COMMUNITY)
Admission: RE | Admit: 2020-05-12 | Discharge: 2020-05-12 | Disposition: A | Discharge status: Home / Self Care | Payer: Medicare Other | Source: Ambulatory Visit | Attending: Neurosurgery | Admitting: Neurosurgery

## 2020-05-12 ENCOUNTER — Other Ambulatory Visit: Payer: Self-pay

## 2020-05-12 ENCOUNTER — Ambulatory Visit (HOSPITAL_COMMUNITY)
Admission: RE | Admit: 2020-05-12 | Discharge: 2020-05-12 | Disposition: A | Discharge status: Home / Self Care | Payer: Medicare Other | Attending: Neurosurgery | Admitting: Neurosurgery

## 2020-05-12 ENCOUNTER — Encounter (HOSPITAL_COMMUNITY): Admission: RE | Disposition: A | Discharge status: Home / Self Care | Payer: Self-pay | Source: Home / Self Care

## 2020-05-12 DIAGNOSIS — Z981 Arthrodesis status: Secondary | ICD-10-CM | POA: Diagnosis not present

## 2020-05-12 DIAGNOSIS — G8929 Other chronic pain: Secondary | ICD-10-CM | POA: Insufficient documentation

## 2020-05-12 DIAGNOSIS — M4316 Spondylolisthesis, lumbar region: Secondary | ICD-10-CM | POA: Diagnosis not present

## 2020-05-12 DIAGNOSIS — M48061 Spinal stenosis, lumbar region without neurogenic claudication: Secondary | ICD-10-CM | POA: Insufficient documentation

## 2020-05-12 DIAGNOSIS — M5136 Other intervertebral disc degeneration, lumbar region: Secondary | ICD-10-CM | POA: Diagnosis not present

## 2020-05-12 DIAGNOSIS — M4726 Other spondylosis with radiculopathy, lumbar region: Secondary | ICD-10-CM | POA: Diagnosis not present

## 2020-05-12 DIAGNOSIS — M545 Low back pain, unspecified: Secondary | ICD-10-CM | POA: Insufficient documentation

## 2020-05-12 DIAGNOSIS — Z79899 Other long term (current) drug therapy: Secondary | ICD-10-CM | POA: Insufficient documentation

## 2020-05-12 DIAGNOSIS — Z6841 Body Mass Index (BMI) 40.0 and over, adult: Secondary | ICD-10-CM

## 2020-05-12 DIAGNOSIS — M5442 Lumbago with sciatica, left side: Secondary | ICD-10-CM | POA: Insufficient documentation

## 2020-05-12 DIAGNOSIS — I10 Essential (primary) hypertension: Secondary | ICD-10-CM

## 2020-05-12 DIAGNOSIS — Z87891 Personal history of nicotine dependence: Secondary | ICD-10-CM | POA: Diagnosis not present

## 2020-05-12 DIAGNOSIS — Z7982 Long term (current) use of aspirin: Secondary | ICD-10-CM | POA: Diagnosis not present

## 2020-05-12 HISTORY — PX: RADIOLOGY WITH ANESTHESIA: SHX6223

## 2020-05-12 LAB — BASIC METABOLIC PANEL
Anion gap: 12 (ref 5–15)
BUN: 33 mg/dL — ABNORMAL HIGH (ref 8–23)
CO2: 23 mmol/L (ref 22–32)
Calcium: 9.7 mg/dL (ref 8.9–10.3)
Chloride: 104 mmol/L (ref 98–111)
Creatinine, Ser: 1.29 mg/dL — ABNORMAL HIGH (ref 0.44–1.00)
GFR, Estimated: 44 mL/min — ABNORMAL LOW (ref >60)
Glucose, Bld: 115 mg/dL — ABNORMAL HIGH (ref 70–99)
Potassium: 4.2 mmol/L (ref 3.5–5.1)
Sodium: 139 mmol/L (ref 135–145)

## 2020-05-12 LAB — CBC
HCT: 37.7 % (ref 36.0–46.0)
Hemoglobin: 11.3 g/dL — ABNORMAL LOW (ref 12.0–15.0)
MCH: 27 pg (ref 26.0–34.0)
MCHC: 30 g/dL (ref 30.0–36.0)
MCV: 90 fL (ref 80.0–100.0)
Platelets: 229 10*3/uL (ref 150–400)
RBC: 4.19 MIL/uL (ref 3.87–5.11)
RDW: 13.6 % (ref 11.5–15.5)
WBC: 5.6 10*3/uL (ref 4.0–10.5)
nRBC: 0 % (ref 0.0–0.2)

## 2020-05-12 LAB — GLUCOSE, CAPILLARY
Glucose-Capillary: 91 mg/dL (ref 70–99)
Glucose-Capillary: 106 mg/dL — ABNORMAL HIGH (ref 70–99)

## 2020-05-12 SURGERY — MRI WITH ANESTHESIA
Anesthesia: General

## 2020-05-12 MED ORDER — SCOPOLAMINE 1 MG/3DAYS TD PT72
MEDICATED_PATCH | TRANSDERMAL | Status: AC
  Filled 2020-05-12: qty 1

## 2020-05-12 MED ORDER — DEXAMETHASONE SODIUM PHOSPHATE 10 MG/ML IJ SOLN
INTRAMUSCULAR | Status: DC | PRN
  Administered 2020-05-12: 10 mg via INTRAVENOUS

## 2020-05-12 MED ORDER — FENTANYL CITRATE (PF) 250 MCG/5ML IJ SOLN
INTRAMUSCULAR | Status: DC | PRN
  Administered 2020-05-12: 50 ug via INTRAVENOUS

## 2020-05-12 MED ORDER — ROCURONIUM BROMIDE 10 MG/ML (PF) SYRINGE
PREFILLED_SYRINGE | INTRAVENOUS | Status: DC | PRN
  Administered 2020-05-12: 60 mg via INTRAVENOUS

## 2020-05-12 MED ORDER — MIDAZOLAM HCL 2 MG/2ML IJ SOLN
INTRAMUSCULAR | Status: DC | PRN
  Administered 2020-05-12: 2 mg via INTRAVENOUS

## 2020-05-12 MED ORDER — PROPOFOL 10 MG/ML IV BOLUS
INTRAVENOUS | Status: DC | PRN
  Administered 2020-05-12: 150 mg via INTRAVENOUS

## 2020-05-12 MED ORDER — EPHEDRINE SULFATE-NACL 50-0.9 MG/10ML-% IV SOSY
PREFILLED_SYRINGE | INTRAVENOUS | Status: DC | PRN
  Administered 2020-05-12: 10 mg via INTRAVENOUS

## 2020-05-12 MED ORDER — ONDANSETRON HCL 4 MG/2ML IJ SOLN
INTRAMUSCULAR | Status: DC | PRN
  Administered 2020-05-12: 4 mg via INTRAVENOUS

## 2020-05-12 MED ORDER — PHENYLEPHRINE HCL-NACL 10-0.9 MG/250ML-% IV SOLN
INTRAVENOUS | Status: DC | PRN
  Administered 2020-05-12: 20 ug/min via INTRAVENOUS

## 2020-05-12 MED ORDER — SUGAMMADEX SODIUM 200 MG/2ML IV SOLN
INTRAVENOUS | Status: DC | PRN
  Administered 2020-05-12: 200 mg via INTRAVENOUS

## 2020-05-12 MED ORDER — LIDOCAINE 2% (20 MG/ML) 5 ML SYRINGE
INTRAMUSCULAR | Status: DC | PRN
  Administered 2020-05-12: 700 mg via INTRAVENOUS

## 2020-05-12 MED ORDER — GADOBUTROL 1 MMOL/ML IV SOLN
10.0000 mL | Freq: Once | INTRAVENOUS | Status: AC | PRN
  Administered 2020-05-12: 10 mL via INTRAVENOUS

## 2020-05-12 MED ORDER — SUCCINYLCHOLINE CHLORIDE 200 MG/10ML IV SOSY: PREFILLED_SYRINGE | INTRAVENOUS | Status: DC | PRN

## 2020-05-12 MED ORDER — LACTATED RINGERS IV SOLN: INTRAVENOUS | Status: DC | PRN

## 2020-05-12 NOTE — Transfer of Care (Signed)
Immediate Anesthesia Transfer of Care Note  Patient: [name]  Procedure(s) Performed: MRI LUMBAR SPINE WITH AND WITHOUT CONTRAST (N/A )  Patient Location: PACU  Anesthesia Type:General  Level of Consciousness: drowsy and patient cooperative  Airway & Oxygen Therapy: Patient Spontanous Breathing  Post-op Assessment: Report given to RN and Post -op Vital signs reviewed and stable  Post vital signs: Reviewed and stable  Last Vitals:  Vitals Value Taken Time  BP 119/80 05/12/20 1142  Temp    Pulse 77 05/12/20 1144  Resp 7 05/12/20 1144  SpO2 93 % 05/12/20 1144  Vitals shown include unvalidated device data.  Last Pain:  Vitals:   05/12/20 0756  TempSrc:   PainSc: 7          Complications: No complications documented.

## 2020-05-12 NOTE — Anesthesia Postprocedure Evaluation (Signed)
Anesthesia Post Note  Patient: BRELAND ELDERS  Procedure(s) Performed: MRI LUMBAR SPINE WITH AND WITHOUT CONTRAST (N/A )     Patient location during evaluation: PACU Anesthesia Type: General Level of consciousness: awake and alert Pain management: pain level controlled Vital Signs Assessment: post-procedure vital signs reviewed and stable Respiratory status: spontaneous breathing, nonlabored ventilation, respiratory function stable and patient connected to nasal cannula oxygen Cardiovascular status: blood pressure returned to baseline and stable Postop Assessment: no apparent nausea or vomiting Anesthetic complications: no   No complications documented.  Last Vitals:  Vitals:   05/12/20 0732  BP: 119/71  Pulse: 75  Resp: 17  Temp: 36.4 C  SpO2: 97%    Last Pain:  Vitals:   05/12/20 0756  TempSrc:   PainSc: 7                  Jullien Granquist S

## 2020-05-12 NOTE — Anesthesia Preprocedure Evaluation (Signed)
Anesthesia Evaluation  Patient identified by MRN, date of birth, ID band Patient awake    Reviewed: Allergy & Precautions, H&P , NPO status , Patient's Chart, lab work & pertinent test results  History of Anesthesia Complications (+) PONV  Airway Mallampati: II  TM Distance: >3 FB Neck ROM: Full    Dental no notable dental hx.    Pulmonary neg pulmonary ROS, former smoker,    Pulmonary exam normal breath sounds clear to auscultation       Cardiovascular hypertension, Pt. on medications and Pt. on home beta blockers Normal cardiovascular exam Rhythm:Regular Rate:Normal  WPW   Neuro/Psych negative neurological ROS  negative psych ROS   GI/Hepatic negative GI ROS, Neg liver ROS,   Endo/Other  diabetesHypothyroidism Morbid obesity  Renal/GU negative Renal ROS  negative genitourinary   Musculoskeletal negative musculoskeletal ROS (+)   Abdominal   Peds negative pediatric ROS (+)  Hematology negative hematology ROS (+)   Anesthesia Other Findings   Reproductive/Obstetrics negative OB ROS                             Anesthesia Physical Anesthesia Plan  ASA: III  Anesthesia Plan: General   Post-op Pain Management:    Induction: Intravenous  PONV Risk Score and Plan: 4 or greater and Ondansetron, Dexamethasone and Treatment may vary due to age or medical condition  Airway Management Planned: Oral ETT  Additional Equipment:   Intra-op Plan:   Post-operative Plan: Extubation in OR  Informed Consent: I have reviewed the patients History and Physical, chart, labs and discussed the procedure including the risks, benefits and alternatives for the proposed anesthesia with the patient or authorized representative who has indicated his/her understanding and acceptance.     Dental advisory given  Plan Discussed with: CRNA and Surgeon  Anesthesia Plan Comments:          Anesthesia Quick Evaluation

## 2020-05-12 NOTE — Anesthesia Procedure Notes (Signed)
Procedure Name: Intubation Date/Time: 05/12/2020 10:24 AM Performed by: Lance Coon, CRNA Pre-anesthesia Checklist: Patient identified, Emergency Drugs available, Suction available, Patient being monitored and Timeout performed Patient Re-evaluated:Patient Re-evaluated prior to induction Oxygen Delivery Method: Circle system utilized Preoxygenation: Pre-oxygenation with 100% oxygen Induction Type: IV induction Ventilation: Mask ventilation without difficulty Laryngoscope Size: Miller and 2 Grade View: Grade I Tube size: 7.0 mm Number of attempts: 1 Airway Equipment and Method: Stylet Placement Confirmation: ETT inserted through vocal cords under direct vision,  positive ETCO2 and breath sounds checked- equal and bilateral Secured at: 21 cm Tube secured with: Tape Dental Injury: Teeth and Oropharynx as per pre-operative assessment

## 2020-05-13 ENCOUNTER — Encounter (HOSPITAL_COMMUNITY): Payer: Self-pay | Admitting: Radiology

## 2020-05-19 ENCOUNTER — Telehealth: Payer: Self-pay | Admitting: Interventional Cardiology

## 2020-05-19 NOTE — Telephone Encounter (Signed)
Left detailed message with recommendations.  Ok per DPR.  Advised to call back if any questions.  

## 2020-05-19 NOTE — Telephone Encounter (Signed)
The blood pressures are acceptable. Monitor 2-3 times a month.

## 2020-05-19 NOTE — Telephone Encounter (Signed)
Patient calling with updated BP readings:  125/74 138/82 137/89 141/79 131/83 138/89 132/86 142/77 129/79

## 2020-05-23 NOTE — H&P (Signed)
HISTORY OF PRESENT ILLNESS:  1.  MRI Review  [name] presents to the clinic today as scheduled to review her recent MRI. She reports that she is continuing to experience constant 6 to 7/10 midline low back pain that radiates into her left buttock.  She also reports that she gets occasional numbness in her left lateral thigh.         Medical/Surgical/Interim History Reviewed, no change.  Last detailed document date:04/05/2020.     Family History:  Reviewed, no changes.  Last detailed document date:04/05/2020.   Social History: Reviewed, no changes. Last detailed document date: 04/05/2020.    MEDICATIONS: (added, continued or stopped this visit) Started Medication Directions Instruction Stopped   alprazolam 0.5 mg tablet take 1 tablet by oral route 3 times every day     aspirin 81 mg tablet,delayed release take 1 tablet by oral route  every day     atenolol 25 mg tablet take 1 tablet by oral route  every day    03/11/2015 Dilaudid 2 mg tablet take 1 tablet by oral route  every 4 hours as needed     furosemide 20 mg tablet take 1 tablet by oral route  every day     pravastatin 20 mg tablet take 1 tablet by oral route  every day     telmisartan 20 mg tablet take 1 tablet by oral route  every day     Trulicity 1.66 AY/3.0 mL subcutaneous pen injector inject (0.75MG )  by subcutaneous route  every week     Uloric 40 mg tablet take 1 tablet by oral route  every day       ALLERGIES: Ingredient Reaction Medication Name Comment  PENTAZOCINE LACTATE Headache,confusion Talwin   MEPERIDINE HCL Nausea/Vomiting, Headaches Demerol   IVP Dye Elevated blood pressure, Nausea/vomiting    ACETAMINOPHEN Stomach pains,vomiting Tylox   OXYCODONE Stomach pains, Nausea/Vomiting    COLCHICINE Severe diarrhea,vomiting    ESOMEPRAZOLE MAGNESIUM Hives,swelling,itching Nexium   OXYCODONE HCL Stomach pains,vomiting Tylox    Reviewed, no changes.    PHYSICAL EXAM:   Vitals Date Temp F BP  Pulse Ht In Wt Lb BMI BSA Pain Score  05/19/2020  128/80 75 63 241.6 42.8  7/10    PHYSICAL EXAM Details General Level of Distress: no acute distress Overall Appearance: normal    Cardiovascular Cardiac: regular rate and rhythm without murmur  Respiratory Lungs: clear to auscultation  Neurological Recent and Remote Memory: normal Attention Span and Concentration:   normal Language: normal Fund of Knowledge: normal  Right Left Sensation: normal normal Upper Extremity Coordination: normal normal  Lower Extremity Coordination: normal normal  Musculoskeletal Gait and Station: normal  Right Left Upper Extremity Muscle Strength: normal normal Lower Extremity Muscle Strength: normal normal Upper Extremity Muscle Tone:  normal normal Lower Extremity Muscle Tone: normal normal   Motor Strength Upper and lower extremity motor strength was tested in the clinically pertinent muscles.     Deep Tendon Reflexes  Right Left Biceps: normal normal Triceps: normal normal Brachioradialis: normal normal Patellar: normal normal Achilles: normal normal  Sensory Sensation was tested at L1 to S1.   Cranial Nerves II. Optic Nerve/Visual Fields: normal III. Oculomotor: normal IV. Trochlear: normal V. Trigeminal: normal VI. Abducens: normal VII. Facial: normal VIII. Acoustic/Vestibular: normal IX. Glossopharyngeal: normal X. Vagus: normal XI. Spinal Accessory: normal XII. Hypoglossal: normal  Motor and other Tests Lhermittes: negative Rhomberg: negative    Right Left Hoffman's: normal normal Clonus: normal normal Babinski: normal  normal SLR: negative negative Patrick's Corky Sox): negative negative Toe Walk: normal normal Toe Lift: normal normal Heel Walk: normal normal SI Joint: nontender nontender     DIAGNOSTIC RESULTS:   Lumbar MRI reviewed with patient in the office today.  The patient has a significant amount of spinal stenosis at the L2-3 level with  bilateral recess stenosis, left greater than right, and foraminal stenosis with potential left nerve root compression.    IMPRESSION:   Patient with a 6 month history of severe left-sided low back pain and left buttock pain.  Her symptoms are continuing to be significant and highly disruptive to her quality of life.  We discussed potential options to treat her symptoms that include injections and physical therapy.  The patient stated that she is able to deal with the pain for the time being and would like to hold off on any treatments at this time due to the COVID-19 pandemic.  She reported that she would like to come back to the office in about 2-3 months or when the current COVID-19 surge decreases to discuss potential treatments.  PLAN:  The patient will follow-up in 3 months  Orders: Instruction(s)/Education: Assessment Instruction  I10 Lifestyle education  Z68.41 Dietary management education, guidance, and counseling   Completed Orders (this encounter) Order Details Reason Side Interpretation Result Initial Treatment Date Region  Lifestyle education Patient will follow up with Primary Care Physician.        Dietary management education, guidance, and counseling Encouraged patient to eat well balanced diet.         Assessment/Plan   # Detail Type Description   1. 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       2. 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       3. Assessment Spondylolisthesis, lumbar region (M43.16).       4. 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       5. 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       6. Assessment Chronic left-sided low back pain with left-sided sciatica (M54.42).       7. Assessment Essential (primary) hypertension (I10).       8. Assessment Body mass index (BMI) 40.0-44.9, adult (Z68.41).   Plan Orders Today's instructions  / counseling include(s) Dietary management education, guidance, and counseling. Clinical information/comments: Encouraged patient to eat well balanced diet.         Pain Management Plan Pain Scale: 7/10. Method: Numeric Pain Intensity Scale. Location: back. Onset: 03/16/2014. Duration: varies. Quality: discomforting. Pain management follow-up plan of care: Patient will continue medication management..              Provider:  Marchia Meiers. Vertell Limber MD  05/19/2020 02:38 PM    Dictation edited by: Fenton Malling, NP

## 2020-05-25 ENCOUNTER — Telehealth: Payer: Self-pay | Admitting: *Deleted

## 2020-05-25 ENCOUNTER — Other Ambulatory Visit: Payer: Self-pay | Admitting: Neurosurgery

## 2020-05-25 NOTE — Telephone Encounter (Signed)
ResumeOkay to hold aspirin based on recommendation from her surgeon.  Resume therapy when instructed.

## 2020-05-25 NOTE — Telephone Encounter (Signed)
   [facility] Pre-operative Risk Assessment    HEARTCARE STAFF: - Please ensure there is not already an duplicate clearance open for this procedure. - Under Visit Info/Reason for Call, type in Other and utilize the format Clearance MM/DD/YY or Clearance TBD. Do not use dashes or single digits. - If request is for dental extraction, please clarify the # of teeth to be extracted.  Request for surgical clearance:  1. What type of surgery is being performed? LEFT L2-3 TRANSFORAMINAL LUMBAR INTERBODY FUSION   2. When is this surgery scheduled? 08/16/20   3. What type of clearance is required (medical clearance vs. Pharmacy clearance to hold med vs. Both)? MEDICAL  4. Are there any medications that need to be held prior to surgery and how long? ASA    5. Practice name and name of physician performing surgery? [facility]; DR. Broadus John STERN   6. What is the office phone number? [phone]   7.   What is the office fax number? [place]; JESSICA  8.   Anesthesia type (None, local, MAC, general) ? GENERAL   Julaine Hua 05/25/2020, 12:29 PM  _________________________________________________________________   (provider comments below)

## 2020-05-25 NOTE — Telephone Encounter (Signed)
   Primary Cardiologist: Sinclair Grooms, MD  Chart reviewed as part of pre-operative protocol coverage. Given past medical history and time since last visit, based on ACC/AHA guidelines, [name] would be at acceptable risk for the planned procedure without further cardiovascular testing.   Her aspirin may be held for 5-7 days prior to her surgery.  Please resume as soon as hemostasis is achieved.  I will route this recommendation to the requesting party via Epic fax function and remove from pre-op pool.  Please call with questions.  Jossie Ng. Nioma Mccubbins NP-C    05/25/2020, 1:23 PM [facility] [place] [address] Office [phone] Fax [phone]

## 2020-05-25 NOTE — Telephone Encounter (Signed)
[name] 75 year old female was last seen in the clinic on 05/06/2020.  During that time she had no cardiac complaints.  She reported intermittent headaches.  She was compliant with her medications none denied shortness of breath.  Her blood pressure was slightly elevated in the office.  She monitors her blood pressures while at home and presented blood pressure documentation that was acceptable.  Her PMH includes near syncope, atypical chest pain, WPW, controlled type 2 diabetes, and palpitations.  Coronary CTA 1/21 showed a coronary calcium score of 62.  Normal coronary origin with right dominance.  Minimal 0-24% calcified plaque in the proximal LAD and proximal and distal RCA,CADRADS-1  She is requesting a lumbar spinal fusion.  May her aspirin be held prior to the procedure?  Thank you for your help.  Please direct response to CV DIV preop pool.  Jossie Ng. Rashee Marschall NP-C    05/25/2020, 12:51 PM [facility] [place] [address] Office [phone] Fax [phone]

## 2020-08-02 NOTE — H&P (Signed)
Patient ID:   [id number] Patient: [name]  Date of Birth: 07/24/1945 Visit Type: Office Visit   Date: 05/25/2020 11:00 AM Provider: Marchia Meiers. Vertell Limber MD   This 75 year old female presents for back pain.  HISTORY OF PRESENT ILLNESS: 1.  back pain  Patient returns to discuss surgical options, stating she would like to proceed in April.  The patient is continuing to have significant pain and is very limited in her ability to function.  She is currently weighing 243 lb and has been working on losing weight.  She grades her pain at 7/10 in severity.  She is complaining of pain into the low back and left buttock and left lateral thigh.  She says nothing relieves her pain and she gets no relief with sitting.  Her imaging demonstrates L2-3 stenosis and left-sided nerve root compression.  She has had prior lumbar fusion by Dr. Sherwood Gambler involving L3 through S1 levels.  The patient has difficulty with standing and on examination she has hip flexor weakness at 4/5 and left quadriceps weakness at 4/5.  Based on her examination and her imaging, I have recommended exploration of prior fusion with extension to L to 3 level with TLIF on the left at the L2-3 level.  I do not believe that a non fusion option will give her much relief.  I also do not think that XL IF will help her as she gets no relief with sitting.  She has tried nonsurgical treatment options and these have not helped her.      Medical/Surgical/Interim History Reviewed, no change.  Last detailed document date:04/05/2020.     PAST MEDICAL HISTORY, SURGICAL HISTORY, FAMILY HISTORY, SOCIAL HISTORY AND REVIEW OF SYSTEMS I have reviewed the patient's past medical, surgical, family and social history as well as the comprehensive review of systems as included on the [facility] history form dated 04/05/2020, which I have signed.  Family History: Reviewed, no changes.  Last detailed document  date:04/05/2020.   Social History: Reviewed, no changes. Last detailed document date: 04/05/2020.    MEDICATIONS: (added, continued or stopped this visit) Started Medication Directions Instruction Stopped  alprazolam 0.5 mg tablet take 1 tablet by oral route 3 times every day    aspirin 81 mg tablet,delayed release take 1 tablet by oral route  every day    atenolol 25 mg tablet take 1 tablet by oral route  every day   03/11/2015 Dilaudid 2 mg tablet take 1 tablet by oral route  every 4 hours as needed    furosemide 20 mg tablet take 1 tablet by oral route  every day    pravastatin 20 mg tablet take 1 tablet by oral route  every day    telmisartan 20 mg tablet take 1 tablet by oral route  every day    Trulicity 7.37 TG/6.2 mL subcutaneous pen injector inject (0.75MG )  by subcutaneous route  every week    Uloric 40 mg tablet take 1 tablet by oral route  every day      ALLERGIES: Ingredient Reaction Medication Name Comment PENTAZOCINE LACTATE Headache,confusion [place]  MEPERIDINE HCL Nausea/Vomiting, Headaches Demerol  IVP Dye Elevated blood pressure, Nausea/vomiting   ACETAMINOPHEN Stomach pains,vomiting Tylox  OXYCODONE Stomach pains, Nausea/Vomiting   COLCHICINE Severe diarrhea,vomiting   ESOMEPRAZOLE MAGNESIUM Hives,swelling,itching Nexium  OXYCODONE HCL Stomach pains,vomiting Tylox   Reviewed, no changes.    PHYSICAL EXAM:  Vitals Date Temp F BP Pulse Ht In Wt Lb BMI BSA Pain  Score 05/25/2020  114/73 69 63 243 43.05  7/10     IMPRESSION:  Spinal stenosis and disc herniation with significant left-sided narrowing at the L2-3 level on the left.  PLAN: Proceed with decompression and fusion L2-3 with TLIF at L2-3 on the left with exploration of prior fusion and extension to the L2 level.  Risks and benefits of surgery discussed in detail with the patient and she wishes to proceed with  surgery.  Patient education was performed.  She wishes to go ahead with surgery in April.  Orders: Diagnostic Procedures: Assessment Procedure M48.061 Lumbar Spine- AP/Lat Instruction(s)/Education: Assessment Instruction Z68.41 Dietary management education, guidance, and counseling Miscellaneous: Assessment  M48.061 LSO Brace  Completed Orders (this encounter) Order Details Reason Side Interpretation Result Initial Treatment Date Region Dietary management education, guidance, and counseling Encouraged patient to eat well balanced diet.        Assessment/Plan  # Detail Type Description  1. 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  Plan Orders LSO Brace.     2. 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     3. 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     4. Assessment Disc displacement, lumbar (M51.26).     5. Assessment Radiculopathy, lumbar region (M54.16).     6. Assessment Body mass index (BMI) 40.0-44.9, adult (Z68.41).  Plan Orders Today's instructions / counseling include(s) Dietary management education, guidance, and counseling. Clinical information/comments: Encouraged patient to eat well balanced diet.       Pain Management Plan Pain Scale: 7/10. Method: Numeric Pain Intensity Scale. Location: back. Onset: 03/16/2014. Duration: varies. Quality: discomforting. Pain management follow-up plan of care: Patient will continue medication management..              Provider:  Marchia Meiers. Vertell Limber MD  05/27/2020 04:57 PM    Dictation edited by: Marchia Meiers. Vertell Limber    CC Providers: Leanna Battles [facility] [address] [place],  [place]  [id number]-   Robert Nudelman MD  [address] [place], [place] [address]               Electronically signed by Marchia Meiers. Vertell Limber MD on 05/27/2020 04:57 PM

## 2020-08-11 NOTE — Pre-Procedure Instructions (Signed)
[name]  08/11/2020     Your procedure is scheduled on   Report to [facility], Main Entrance or Entrance "A" at 5:30 AM.                Your surgery or procedure is scheduled to begin at 7:30 AM   Call this number if you have problems the morning of surgery: [phone]  This is the number for the Pre- Surgical Desk.                For any other questions, please call [phone], Monday - Friday 8 AM - 4 PM.   Remember:  Do not eat or drink after midnight , Monday April 18.     Take these medicines the morning of surgery with A SIP OF WATER: atenolol (TENORMIN)  febuxostat (ULORIC) levothyroxine (SYNTHROID, LEVOTHROID)  Take if needed: ALPRAZolam Duanne Moron)   STOP taking Aspirin, Aspirin Products (Goody Powder, Excedrin Migraine), Ibuprofen (Advil), Naproxen (Aleve), Vitamins and Herbal Products (ie Fish Oil).   How to Manage Your Diabetes Before and After Surgery  Why is it important to control my blood sugar before and after surgery? . Improving blood sugar levels before and after surgery helps healing and can limit problems. . A way of improving blood sugar control is eating a healthy diet by: o  Eating less sugar and carbohydrates o  Increasing activity/exercise o  Talking with your doctor about reaching your blood sugar goals . High blood sugars (greater than 180 mg/dL) can raise your risk of infections and slow your recovery, so you will need to focus on controlling your diabetes during the weeks before surgery. . Make sure that the doctor who takes care of your diabetes knows about your planned surgery including the date and location.  How do I manage my blood sugar before surgery? . Check your blood sugar at least 4 times a day, starting 2 days before surgery, to make sure that the level is not too high or low. o Check your blood sugar the morning of your surgery when you wake up and every 2 hours until you get to the Short Stay unit. . If your  blood sugar is less than 70 mg/dL, you will need to treat for low blood sugar: o Do not take insulin. o Treat a low blood sugar (less than 70 mg/dL) with  cup of clear juice (cranberry or apple), 4 glucose tablets, OR glucose gel. Recheck blood sugar in 15 minutes after treatment (to make sure it is greater than 70 mg/dL). If your blood sugar is not greater than 70 mg/dL on recheck, call [phone] o  for further instructions. . Report your blood sugar to the short stay nurse when you get to Short Stay.  . If you are admitted to the hospital after surgery: o Your blood sugar will be checked by the staff and you will probably be given insulin after surgery (instead of oral diabetes medicines) to make sure you have good blood sugar levels. o The goal for blood sugar control after surgery is 80-180 mg     Special instructions:  If you smoke: Do NOT Smoke within 24 hours prior to surgery.   If you use a CPAP Bring the mask with you to the hospital..  [facility]- Preparing For Surgery  Before surgery, you can play an important role. Because skin is not sterile, your skin needs to be as free of germs as possible. You can  reduce the number of germs on your skin by washing with CHG (chlorahexidine gluconate) Soap before surgery.  CHG is an antiseptic cleaner which kills germs and bonds with the skin to continue killing germs even after washing.    Oral Hygiene is also important to reduce your risk of infection.  Remember - BRUSH YOUR TEETH THE MORNING OF SURGERY WITH YOUR REGULAR TOOTHPASTE  Please do not use if you have an allergy to CHG or antibacterial soaps. If your skin becomes reddened/irritated stop using the CHG.  Do not shave (including legs and underarms) for at least 48 hours prior to first CHG shower. It is OK to shave your face.  Please follow these instructions carefully.   1. Shower the NIGHT BEFORE SURGERY and the MORNING OF SURGERY with CHG.   2. If you chose to wash your  hair, wash your hair first as usual with your normal shampoo.  3. After you shampoo, wash your face and private area with the soap you use at home, then rinse your hair and body thoroughly to remove the shampoo and soap.  4. Use CHG as you would any other liquid soap. You can apply CHG directly to the skin and wash gently with a scrungie or a clean washcloth.   5. Apply the CHG Soap to your body ONLY FROM THE NECK DOWN.  Do not use on open wounds or open sores. Avoid contact with your eyes, ears, mouth and genitals (private parts).   6. Wash thoroughly, paying special attention to the area where your surgery will be performed.  7. Thoroughly rinse your body with warm water from the neck down.  8. DO NOT shower/wash with your normal soap after using and rinsing off the CHG Soap.  9. Pat yourself dry with a CLEAN TOWEL.  10. Wear CLEAN PAJAMAS to bed the night before surgery, wear comfortable clothes the morning of surgery  11. Place CLEAN SHEETS on your bed the night of your first shower and DO NOT SLEEP WITH PETS.  Day of Surgery: Shower as instructed above. Do not apply any deodorants/lotions, powders or colognes.  Please wear clean clothes to the hospital/surgery center.   Remember to brush your teeth WITH YOUR REGULAR TOOTHPASTE.  Do not wear jewelry, make-up or nail polish.  Do not shave 48 hours prior to surgery.  Men may shave face and neck.  Do not bring valuables to the hospital.  [facility] is not responsible for any belongings or valuables.  Contacts, dentures or bridgework may not be worn into surgery.  Leave your suitcase in the car.  After surgery it may be brought to your room.  For patients admitted to the hospital, discharge time will be determined by your treatment team.  Patients discharged the day of surgery will not be allowed to drive home.   Please read over the fact sheets that you were given.

## 2020-08-11 NOTE — Progress Notes (Signed)
PCP - Dr Leanna Battles Cardiologist - Dr Daneen Schick   Chest x-ray - n/a EKG - 05/06/20 Stress Test - 04/22/18 ECHO - 12/06/15 Cardiac Cath - greater than 10 yrs ago  Fasting Blood Sugar - 90-120s Checks Blood Sugar 1 times a day  . The day of surgery, do not take Trulicity (dulaglutide).  Aspirin Instructions: Last dose on 08/09/20.  Anesthesia review: Yes  STOP now taking any Aspirin (unless otherwise instructed by your surgeon), Aleve, Naproxen, Ibuprofen, Motrin, Advil, Goody's, BC's, all herbal medications, fish oil, and all vitamins.   Coronavirus Screening Covid test is scheduled on 08/12/20 Do you have any of the following symptoms:  Cough yes/no: No Fever (>100.16F)  yes/no: No Runny nose yes/no: No Sore throat yes/no: No Difficulty breathing/shortness of breath  yes/no: No  Have you traveled in the last 14 days and where? yes/no: No  Patient verbalized understanding of instructions that were given to them at the PAT appointment.

## 2020-08-12 ENCOUNTER — Encounter (HOSPITAL_COMMUNITY)
Admission: RE | Admit: 2020-08-12 | Discharge: 2020-08-12 | Disposition: A | Discharge status: Home / Self Care | Payer: Medicare Other | Source: Ambulatory Visit | Attending: Neurosurgery | Admitting: Neurosurgery

## 2020-08-12 ENCOUNTER — Encounter (HOSPITAL_COMMUNITY): Payer: Self-pay

## 2020-08-12 ENCOUNTER — Other Ambulatory Visit: Payer: Self-pay

## 2020-08-12 DIAGNOSIS — Z01812 Encounter for preprocedural laboratory examination: Secondary | ICD-10-CM | POA: Diagnosis not present

## 2020-08-12 DIAGNOSIS — Z20822 Contact with and (suspected) exposure to covid-19: Secondary | ICD-10-CM | POA: Insufficient documentation

## 2020-08-12 LAB — SURGICAL PCR SCREEN
MRSA, PCR: NEGATIVE
Staphylococcus aureus: POSITIVE — AB

## 2020-08-12 LAB — BASIC METABOLIC PANEL
Anion gap: 8 (ref 5–15)
BUN: 21 mg/dL (ref 8–23)
CO2: 26 mmol/L (ref 22–32)
Calcium: 9.6 mg/dL (ref 8.9–10.3)
Chloride: 105 mmol/L (ref 98–111)
Creatinine, Ser: 1.34 mg/dL — ABNORMAL HIGH (ref 0.44–1.00)
GFR, Estimated: 42 mL/min — ABNORMAL LOW (ref >60)
Glucose, Bld: 100 mg/dL — ABNORMAL HIGH (ref 70–99)
Potassium: 4.4 mmol/L (ref 3.5–5.1)
Sodium: 139 mmol/L (ref 135–145)

## 2020-08-12 LAB — CBC
HCT: 36.2 % (ref 36.0–46.0)
Hemoglobin: 11.2 g/dL — ABNORMAL LOW (ref 12.0–15.0)
MCH: 27.9 pg (ref 26.0–34.0)
MCHC: 30.9 g/dL (ref 30.0–36.0)
MCV: 90.3 fL (ref 80.0–100.0)
Platelets: 275 10*3/uL (ref 150–400)
RBC: 4.01 MIL/uL (ref 3.87–5.11)
RDW: 13.6 % (ref 11.5–15.5)
WBC: 5.1 10*3/uL (ref 4.0–10.5)
nRBC: 0 % (ref 0.0–0.2)

## 2020-08-12 LAB — GLUCOSE, CAPILLARY: Glucose-Capillary: 116 mg/dL — ABNORMAL HIGH (ref 70–99)

## 2020-08-12 LAB — TYPE AND SCREEN
ABO/RH(D): O POS
Antibody Screen: NEGATIVE

## 2020-08-12 LAB — HEMOGLOBIN A1C
Hgb A1c MFr Bld: 6 % — ABNORMAL HIGH (ref 4.8–5.6)
Mean Plasma Glucose: 125.5 mg/dL

## 2020-08-12 LAB — SARS CORONAVIRUS 2 (TAT 6-24 HRS): SARS Coronavirus 2: NEGATIVE

## 2020-08-15 NOTE — Progress Notes (Addendum)
Anesthesia Chart Review:  Case: [id number] Date/Time: 08/16/20 0715   Procedure: Left Lumbar 2-3 Transforaminal lumbar interbody fusion with exploration and extension of adjacent level fusion (Left Back) - 3C/RM 21   Anesthesia type: General   Pre-op diagnosis: Spinal stenosis, Lumbar region without neurogenic claudication   Location: [facility] OR ROOM 21 / [facility]   Surgeons: Erline Levine, MD      DISCUSSION: Patient is a 75 year old female scheduled for the above procedure.  History includes former smoker (quit 04/30/76), post-operative N/V, WPW Syndrome, syncope (2017, felt related to orthostatic hypotension), HLD, HTN, DM2, CKD (stage III), hypothyroidism, left thyroid lobectomy (for nodule 01/16/01), anxiety, gout, anemia, chronic back pain, back surgery (L5 Gill procedure, L5-S1synovial cyst resection, posterolateral arthrodesis 02/23/03; L3-5 PLIF 08/16/14), TKR (right 04/02/16). BMI is consistent with morbid obesity.   Last cardiology visit with Dr. Tamala Julian 05/06/20 for follow-up history of WPW (asymptomatic), near syncope (2017), atypical chest pain (minimal CAD by CCTA 2021). No chest pain. One year follow-up recommended. Preoperative cardiology input outline 05/25/20 by Coletta Memos, NP, "Given past medical history and time since last visit, based on ACC/AHA guidelines, [name] would be at acceptable risk for the planned procedure without further cardiovascular testing.   Her aspirin may be held for 5-7 days prior to her surgery.  Please resume as soon as hemostasis is achieved." Last ASA 08/09/20.   08/12/2020 presurgical COVID-19 test negative.  Anesthesia team to evaluate on the day of surgery.   VS: BP 117/74   Pulse 65   Temp 36.7 C   Resp 18   Ht 5\' 3"  (1.6 m)   Wt 109 kg   LMP  (LMP Unknown)   SpO2 99%   BMI 42.57 kg/m    PROVIDERS: Leanna Battles, MD is PCP  Daneen Schick, MD is cardiologist Elmarie Shiley, MD is nephrologist   LABS: Labs reviewed: Acceptable for  surgery. (all labs ordered are listed, but only abnormal results are displayed)  Labs Reviewed  SURGICAL PCR SCREEN - Abnormal; Notable for the following components:      Result Value   Staphylococcus aureus POSITIVE (*)    All other components within normal limits  GLUCOSE, CAPILLARY - Abnormal; Notable for the following components:   Glucose-Capillary 116 (*)    All other components within normal limits  CBC - Abnormal; Notable for the following components:   Hemoglobin 11.2 (*)    All other components within normal limits  BASIC METABOLIC PANEL - Abnormal; Notable for the following components:   Glucose, Bld 100 (*)    Creatinine, Ser 1.34 (*)    GFR, Estimated 42 (*)    All other components within normal limits  HEMOGLOBIN A1C - Abnormal; Notable for the following components:   Hgb A1c MFr Bld 6.0 (*)    All other components within normal limits  SARS CORONAVIRUS 2 (TAT 6-24 HRS)  TYPE AND SCREEN     IMAGES: MRI L-spine 05/12/20: IMPRESSION: Degenerative and postoperative changes as detailed above. Progression of canal, subarticular recess, and foraminal stenosis at L2-L3 with potential left nerve root compression.    EKG: 05/06/20 ([facility]): SB at 57 bpm   CV: CT Coronary 05/22/19: IMPRESSION: 1. Coronary calcium score of 62. This was 12 percentile for age and sex matched control. 2. Normal coronary origin with right dominance. 3. Minimal (0-24%) calcified plaque in the proximal LAD and proximal and distal RCA; CADRADS-1.   Nuclear stress test 04/22/18:  The left ventricular ejection  fraction is normal (55-65%).  Nuclear stress EF: 62%.  Blood pressure demonstrated a normal response to exercise.  There was no ST segment deviation noted during stress.  The study is normal.  This is a low risk study.  Normal resting and stress perfusion. No ischemia or infarction EF 62%   Cardiac event monitor 12/06/15-01/05/16:  Study Highlights  Basic  underlying rhythm is normal sinus rhythm.  No significant episodes of tachycardia or bradycardia noted.  No atrial fibrillation or atrial flutter.  No pauses.  No correlation of symptoms with rhythm disturbance.  No arrhythmias. No correlation of symptoms with rhythm disturbance. Occasional morphologic appearance of WPW conduction.   Echo 12/06/15:  Study Conclusions - Left ventricle: The cavity size was normal. Wall thickness was normal. Systolic function was normal. The estimated ejectionfraction was in the range of 55% to 60%. Wall motion was normal; there were no regional wall motion abnormalities. Doppler parameters are consistent with abnormal left ventricular relaxation (grade 1 diastolic dysfunction). - Atrial septum: There was an atrial septal aneurysm. Impressions:  - Normal LV systolic function; grade 1 diastolic dysfunction; trace  MR and TR.    Cardiac cath 09/06/04: CONCLUSION: 1.  Up to 50% proximal circumflex narrowing with an acute angulation and 40-50% mid left anterior descending artery stenosis. No hemodynamically significant obstructions are felt to be present.  2.  Normal left ventricular function with normal left ventricular pressures.  3.  Recent decreased left ventricular function by gated Cardiolite suggests that the Cardiolite information is in error number.  4.  The patient's chest discomfort the prolonged nature, the negative cardiac enzymes and nonobstructive coronary disease, all suggestive this chest discomfort is noncardiac. I think the most likely source would be esophageal disease and/or reflux with esophageal spasm.   Past Medical History:  Diagnosis Date  . Acute blood loss as cause of postoperative anemia 04/03/2016  . Anemia    hx of  . Anxiety    takes Xanax daily as needed  . Arthritis   . Bronchitis    a month ago  . Chronic back pain    stenosis  . Chronic kidney disease    stage 3-sees Dr.J Patel  . Class 3 obesity due to excess  calories with body mass index (BMI) of 40.0 to 44.9 in adult   . Diabetes mellitus    type II; takes Byetta and Invokana daily  . Glaucoma    uses Eye Drops  . Gout    takes Uloric daily  . History of blood transfusion    no abnormal reaction   . History of colon polyps   . Hyperlipidemia    no meds taken now  . Hypertension    takes HCTZ daily as well as Aldactone  . Hypothyroidism    takes Synthroid daily  . Pneumonia    many yrs ago  . PONV (postoperative nausea and vomiting)   . Primary localized osteoarthritis of right knee   . Stress incontinence   . Syncope   . Weakness    numbness and tingling in right hip/eg/  . Wears glasses   . Wolf-Parkinson-White syndrome    takes Atenolol daily    Past Surgical History:  Procedure Laterality Date  . ABDOMINAL HYSTERECTOMY    . APPENDECTOMY    . BACK SURGERY  2006   lumb fusion  . BACK SURGERY  08/16/2014   L3-4 fusion  . BREAST BIOPSY    . BREAST ENHANCEMENT SURGERY Bilateral   .  BREAST EXCISIONAL BIOPSY Left    benign  . CARDIAC CATHETERIZATION  >48yrs ago  . CARPAL TUNNEL RELEASE Bilateral    x4  . cataract surgery Bilateral   . CESAREAN SECTION     x 3  . CHOLECYSTECTOMY  1981  . COLONOSCOPY    . KNEE ARTHROSCOPY Right 2009   rt  . RADIOLOGY WITH ANESTHESIA N/A 05/12/2020   Procedure: MRI LUMBAR SPINE WITH AND WITHOUT CONTRAST;  Surgeon: Radiologist, Medication, MD;  Location: [facility];  Service: Radiology;  Laterality: N/A;  . RAZ procedure  10+yrs ago  . REDUCTION MAMMAPLASTY Bilateral 2006  . ROTATOR CUFF REPAIR Right    x 4  . SHOULDER ARTHROSCOPY WITH ROTATOR CUFF REPAIR AND SUBACROMIAL DECOMPRESSION Right 06/10/2012   Procedure: SHOULDER ARTHROSCOPY WITH ROTATOR CUFF REPAIR AND SUBACROMIAL DECOMPRESSION;  Surgeon: Lorn Junes, MD;  Location: [facility];  Service: Orthopedics;  Laterality: Right;  RIGHT SHOULDER ARTHROSCOPY, DECOMPRESSION SUBACROMIAL PARTIAL ACROMIOPLASTY WITH CORACOACROMIAL  RELEASE,, DISTAL CLAVICULECTOMY , ROTATOR CUFF debriedment  . SKIN BIOPSY     removed moles all over body  . THYROID SURGERY Right    right thyroid removed  . TOTAL KNEE ARTHROPLASTY Right 04/02/2016   Procedure: RIGHT TOTAL KNEE ARTHROPLASTY;  Surgeon: Elsie Saas, MD;  Location: [facility];  Service: Orthopedics;  Laterality: Right;    MEDICATIONS: . ALPRAZolam (XANAX) 0.5 MG tablet  . aspirin 81 MG chewable tablet  . atenolol (TENORMIN) 25 MG tablet  . dicyclomine (BENTYL) 10 MG capsule  . febuxostat (ULORIC) 40 MG tablet  . latanoprost (XALATAN) 0.005 % ophthalmic solution  . levothyroxine (SYNTHROID, LEVOTHROID) 50 MCG tablet  . Multiple Vitamins-Minerals (MULTIVITAMIN WITH MINERALS) tablet  . pravastatin (PRAVACHOL) 20 MG tablet  . telmisartan (MICARDIS) 40 MG tablet  . TRULICITY 4.66 ZL/9.3TT SOPN   . 0.9 %  sodium chloride infusion    Myra Gianotti, PA-C Surgical Short Stay/Anesthesiology  [facility] Phone [phone] [facility] Phone [phone] 08/15/2020 10:02 AM

## 2020-08-15 NOTE — Anesthesia Preprocedure Evaluation (Addendum)
Anesthesia Evaluation  Patient identified by MRN, date of birth, ID band Patient awake    Reviewed: Allergy & Precautions, NPO status , Patient's Chart, lab work & pertinent test results  History of Anesthesia Complications (+) PONV and history of anesthetic complications  Airway Mallampati: II  TM Distance: >3 FB Neck ROM: Full    Dental  (+) Dental Advisory Given, Teeth Intact, Missing   Pulmonary pneumonia, former smoker,    Pulmonary exam normal breath sounds clear to auscultation       Cardiovascular hypertension, + CAD  Normal cardiovascular exam Rhythm:Regular Rate:Normal  Echo - Left ventricle: The cavity size was normal. Wall thickness wasnormal. Systolic function was normal. The estimated ejectionfraction was in the range of 55% to 60%. Wall motion was normal; there were no regional wall motion abnormalities. Dopplerparameters are consistent with abnormal left ventricular relaxation (grade 1 diastolic dysfunction).  - Atrial septum: There was an atrial septal aneurysm.    Neuro/Psych PSYCHIATRIC DISORDERS Anxiety negative neurological ROS     GI/Hepatic Neg liver ROS, GERD  ,  Endo/Other  negative endocrine ROSdiabetes  Renal/GU Renal disease     Musculoskeletal  (+) Arthritis ,   Abdominal (+) + obese,   Peds  Hematology negative hematology ROS (+) anemia ,   Anesthesia Other Findings   Reproductive/Obstetrics                                                            Anesthesia Evaluation  Patient identified by MRN, date of birth, ID band Patient awake    Reviewed: Allergy & Precautions, H&P , NPO status , Patient's Chart, lab work & pertinent test results  History of Anesthesia Complications (+) PONV  Airway Mallampati: II  TM Distance: >3 FB Neck ROM: Full    Dental no notable dental hx.    Pulmonary neg pulmonary ROS, former smoker,    Pulmonary exam  normal breath sounds clear to auscultation       Cardiovascular hypertension, Pt. on medications and Pt. on home beta blockers Normal cardiovascular exam Rhythm:Regular Rate:Normal  WPW   Neuro/Psych negative neurological ROS  negative psych ROS   GI/Hepatic negative GI ROS, Neg liver ROS,   Endo/Other  diabetesHypothyroidism Morbid obesity  Renal/GU negative Renal ROS  negative genitourinary   Musculoskeletal negative musculoskeletal ROS (+)   Abdominal   Peds negative pediatric ROS (+)  Hematology negative hematology ROS (+)   Anesthesia Other Findings   Reproductive/Obstetrics negative OB ROS                             Anesthesia Physical Anesthesia Plan  ASA: III  Anesthesia Plan: General   Post-op Pain Management:    Induction: Intravenous  PONV Risk Score and Plan: 4 or greater and Ondansetron, Dexamethasone and Treatment may vary due to age or medical condition  Airway Management Planned: Oral ETT  Additional Equipment:   Intra-op Plan:   Post-operative Plan: Extubation in OR  Informed Consent: I have reviewed the patients History and Physical, chart, labs and discussed the procedure including the risks, benefits and alternatives for the proposed anesthesia with the patient or authorized representative who has indicated his/her understanding and acceptance.     Dental advisory given  Plan  Discussed with: CRNA and Surgeon  Anesthesia Plan Comments:         Anesthesia Quick Evaluation  Anesthesia Physical Anesthesia Plan  ASA: III  Anesthesia Plan: General   Post-op Pain Management:    Induction: Intravenous  PONV Risk Score and Plan: 4 or greater and Ondansetron, Dexamethasone, Diphenhydramine and Scopolamine patch - Pre-op  Airway Management Planned: Oral ETT  Additional Equipment: None  Intra-op Plan:   Post-operative Plan: Extubation in OR  Informed Consent: I have reviewed the  patients History and Physical, chart, labs and discussed the procedure including the risks, benefits and alternatives for the proposed anesthesia with the patient or authorized representative who has indicated his/her understanding and acceptance.     Dental advisory given  Plan Discussed with: CRNA  Anesthesia Plan Comments: (Delayed to OR due to difficulty of IV placement  PAT note written 08/15/2020 by Myra Gianotti, PA-C. )      Anesthesia Quick Evaluation

## 2020-08-16 ENCOUNTER — Inpatient Hospital Stay (HOSPITAL_COMMUNITY): Payer: Medicare Other

## 2020-08-16 ENCOUNTER — Other Ambulatory Visit: Payer: Self-pay

## 2020-08-16 ENCOUNTER — Encounter (HOSPITAL_COMMUNITY)
Admission: RE | Disposition: A | Discharge status: Home Health | Payer: Self-pay | Source: Home / Self Care | Attending: Neurosurgery

## 2020-08-16 ENCOUNTER — Inpatient Hospital Stay (HOSPITAL_COMMUNITY): Payer: Medicare Other | Admitting: Vascular Surgery

## 2020-08-16 ENCOUNTER — Inpatient Hospital Stay (HOSPITAL_COMMUNITY)
Admission: RE | Admit: 2020-08-16 | Discharge: 2020-08-18 | DRG: 454 | Disposition: A | Discharge status: Home Health | Payer: Medicare Other | Attending: Neurosurgery | Admitting: Neurosurgery

## 2020-08-16 ENCOUNTER — Encounter (HOSPITAL_COMMUNITY): Payer: Self-pay | Admitting: Neurosurgery

## 2020-08-16 ENCOUNTER — Inpatient Hospital Stay (HOSPITAL_COMMUNITY): Payer: Medicare Other | Admitting: Anesthesiology

## 2020-08-16 DIAGNOSIS — Z885 Allergy status to narcotic agent status: Secondary | ICD-10-CM | POA: Diagnosis not present

## 2020-08-16 DIAGNOSIS — I456 Pre-excitation syndrome: Secondary | ICD-10-CM | POA: Diagnosis present

## 2020-08-16 DIAGNOSIS — I2510 Atherosclerotic heart disease of native coronary artery without angina pectoris: Secondary | ICD-10-CM | POA: Diagnosis present

## 2020-08-16 DIAGNOSIS — Z886 Allergy status to analgesic agent status: Secondary | ICD-10-CM

## 2020-08-16 DIAGNOSIS — Z87891 Personal history of nicotine dependence: Secondary | ICD-10-CM | POA: Diagnosis not present

## 2020-08-16 DIAGNOSIS — Z91048 Other nonmedicinal substance allergy status: Secondary | ICD-10-CM | POA: Diagnosis not present

## 2020-08-16 DIAGNOSIS — M5116 Intervertebral disc disorders with radiculopathy, lumbar region: Secondary | ICD-10-CM | POA: Diagnosis present

## 2020-08-16 DIAGNOSIS — K219 Gastro-esophageal reflux disease without esophagitis: Secondary | ICD-10-CM | POA: Diagnosis present

## 2020-08-16 DIAGNOSIS — Z1211 Encounter for screening for malignant neoplasm of colon: Secondary | ICD-10-CM

## 2020-08-16 DIAGNOSIS — Z6841 Body Mass Index (BMI) 40.0 and over, adult: Secondary | ICD-10-CM

## 2020-08-16 DIAGNOSIS — M48061 Spinal stenosis, lumbar region without neurogenic claudication: Principal | ICD-10-CM | POA: Diagnosis present

## 2020-08-16 DIAGNOSIS — Z91041 Radiographic dye allergy status: Secondary | ICD-10-CM

## 2020-08-16 DIAGNOSIS — I10 Essential (primary) hypertension: Secondary | ICD-10-CM | POA: Diagnosis present

## 2020-08-16 DIAGNOSIS — Z888 Allergy status to other drugs, medicaments and biological substances status: Secondary | ICD-10-CM

## 2020-08-16 DIAGNOSIS — Z419 Encounter for procedure for purposes other than remedying health state, unspecified: Secondary | ICD-10-CM

## 2020-08-16 HISTORY — PX: TRANSFORAMINAL LUMBAR INTERBODY FUSION (TLIF) WITH PEDICLE SCREW FIXATION 1 LEVEL: SHX6141

## 2020-08-16 LAB — GLUCOSE, CAPILLARY
Glucose-Capillary: 121 mg/dL — ABNORMAL HIGH (ref 70–99)
Glucose-Capillary: 124 mg/dL — ABNORMAL HIGH (ref 70–99)
Glucose-Capillary: 160 mg/dL — ABNORMAL HIGH (ref 70–99)
Glucose-Capillary: 159 mg/dL — ABNORMAL HIGH (ref 70–99)

## 2020-08-16 SURGERY — TRANSFORAMINAL LUMBAR INTERBODY FUSION (TLIF) WITH PEDICLE SCREW FIXATION 1 LEVEL
Anesthesia: General | Site: Spine Lumbar | Laterality: Left

## 2020-08-16 MED ORDER — HYDROXYZINE HCL 50 MG/ML IM SOLN
50.0000 mg | Freq: Four times a day (QID) | INTRAMUSCULAR | Status: DC | PRN
  Administered 2020-08-16: 50 mg via INTRAMUSCULAR
  Filled 2020-08-16: qty 1

## 2020-08-16 MED ORDER — KCL IN DEXTROSE-NACL 20-5-0.45 MEQ/L-%-% IV SOLN: INTRAVENOUS | Status: DC

## 2020-08-16 MED ORDER — BUPIVACAINE HCL (PF) 0.5 % IJ SOLN
INTRAMUSCULAR | Status: AC
  Filled 2020-08-16: qty 30

## 2020-08-16 MED ORDER — METHOCARBAMOL 500 MG PO TABS
500.0000 mg | ORAL_TABLET | Freq: Four times a day (QID) | ORAL | Status: DC | PRN
  Administered 2020-08-16 – 2020-08-18 (×6): 500 mg via ORAL
  Filled 2020-08-16 (×6): qty 1

## 2020-08-16 MED ORDER — ATENOLOL 25 MG PO TABS
25.0000 mg | ORAL_TABLET | Freq: Every day | ORAL | Status: DC
  Administered 2020-08-17 – 2020-08-18 (×2): 25 mg via ORAL
  Filled 2020-08-16 (×2): qty 1

## 2020-08-16 MED ORDER — THROMBIN 5000 UNITS EX SOLR: OROMUCOSAL | Status: DC | PRN

## 2020-08-16 MED ORDER — ONDANSETRON HCL 4 MG PO TABS: 4.0000 mg | ORAL_TABLET | Freq: Four times a day (QID) | ORAL | Status: DC | PRN

## 2020-08-16 MED ORDER — PHENOL 1.4 % MT LIQD: 1.0000 | OROMUCOSAL | Status: DC | PRN

## 2020-08-16 MED ORDER — ASPIRIN 81 MG PO CHEW
81.0000 mg | CHEWABLE_TABLET | Freq: Every day | ORAL | Status: DC
  Administered 2020-08-16 – 2020-08-18 (×3): 81 mg via ORAL
  Filled 2020-08-16 (×3): qty 1

## 2020-08-16 MED ORDER — PROPOFOL 10 MG/ML IV BOLUS
INTRAVENOUS | Status: DC | PRN
  Administered 2020-08-16: 150 mg via INTRAVENOUS

## 2020-08-16 MED ORDER — FENTANYL CITRATE (PF) 100 MCG/2ML IJ SOLN
INTRAMUSCULAR | Status: DC | PRN
  Administered 2020-08-16: 100 ug via INTRAVENOUS
  Administered 2020-08-16: 50 ug via INTRAVENOUS

## 2020-08-16 MED ORDER — ONDANSETRON HCL 4 MG/2ML IJ SOLN
INTRAMUSCULAR | Status: AC
  Filled 2020-08-16: qty 2

## 2020-08-16 MED ORDER — CHLORHEXIDINE GLUCONATE CLOTH 2 % EX PADS: 6.0000 | MEDICATED_PAD | Freq: Once | CUTANEOUS | Status: DC

## 2020-08-16 MED ORDER — CHLORHEXIDINE GLUCONATE 0.12 % MT SOLN: 15.0000 mL | Freq: Once | OROMUCOSAL | Status: AC

## 2020-08-16 MED ORDER — PHENYLEPHRINE 40 MCG/ML (10ML) SYRINGE FOR IV PUSH (FOR BLOOD PRESSURE SUPPORT)
PREFILLED_SYRINGE | INTRAVENOUS | Status: DC | PRN
  Administered 2020-08-16 (×2): 120 ug via INTRAVENOUS
  Administered 2020-08-16: 80 ug via INTRAVENOUS

## 2020-08-16 MED ORDER — BUPIVACAINE LIPOSOME 1.3 % IJ SUSP
INTRAMUSCULAR | Status: AC
  Filled 2020-08-16: qty 20

## 2020-08-16 MED ORDER — LIDOCAINE-EPINEPHRINE 1 %-1:100000 IJ SOLN
INTRAMUSCULAR | Status: DC | PRN
  Administered 2020-08-16: 5 mL

## 2020-08-16 MED ORDER — DEXAMETHASONE SODIUM PHOSPHATE 10 MG/ML IJ SOLN
INTRAMUSCULAR | Status: AC
  Filled 2020-08-16: qty 1

## 2020-08-16 MED ORDER — THROMBIN 5000 UNITS EX SOLR
CUTANEOUS | Status: AC
  Filled 2020-08-16: qty 15000

## 2020-08-16 MED ORDER — TRAMADOL HCL 50 MG PO TABS: 50.0000 mg | ORAL_TABLET | Freq: Four times a day (QID) | ORAL | Status: DC | PRN

## 2020-08-16 MED ORDER — HYDROMORPHONE HCL 1 MG/ML IJ SOLN
0.5000 mg | INTRAMUSCULAR | Status: DC | PRN
  Administered 2020-08-16: 0.5 mg via INTRAVENOUS
  Filled 2020-08-16: qty 0.5

## 2020-08-16 MED ORDER — IRBESARTAN 150 MG PO TABS
150.0000 mg | ORAL_TABLET | Freq: Every day | ORAL | Status: DC
  Administered 2020-08-16 – 2020-08-18 (×2): 150 mg via ORAL
  Filled 2020-08-16 (×3): qty 1

## 2020-08-16 MED ORDER — KETOROLAC TROMETHAMINE 15 MG/ML IJ SOLN
7.5000 mg | Freq: Four times a day (QID) | INTRAMUSCULAR | Status: AC
  Administered 2020-08-16 – 2020-08-17 (×4): 7.5 mg via INTRAVENOUS
  Filled 2020-08-16 (×4): qty 1

## 2020-08-16 MED ORDER — SUGAMMADEX SODIUM 200 MG/2ML IV SOLN
INTRAVENOUS | Status: DC | PRN
  Administered 2020-08-16: 200 mg via INTRAVENOUS

## 2020-08-16 MED ORDER — METHOCARBAMOL 1000 MG/10ML IJ SOLN
500.0000 mg | Freq: Four times a day (QID) | INTRAVENOUS | Status: DC | PRN
  Filled 2020-08-16: qty 5

## 2020-08-16 MED ORDER — INSULIN ASPART 100 UNIT/ML ~~LOC~~ SOLN: 0.0000 [IU] | Freq: Three times a day (TID) | SUBCUTANEOUS | Status: DC

## 2020-08-16 MED ORDER — SODIUM CHLORIDE 0.9% FLUSH: 3.0000 mL | INTRAVENOUS | Status: DC | PRN

## 2020-08-16 MED ORDER — LIDOCAINE 2% (20 MG/ML) 5 ML SYRINGE
INTRAMUSCULAR | Status: DC | PRN
  Administered 2020-08-16: 100 mg via INTRAVENOUS

## 2020-08-16 MED ORDER — LEVOTHYROXINE SODIUM 25 MCG PO TABS: 50.0000 ug | ORAL_TABLET | ORAL | Status: DC

## 2020-08-16 MED ORDER — LATANOPROST 0.005 % OP SOLN
1.0000 [drp] | Freq: Every day | OPHTHALMIC | Status: DC
  Administered 2020-08-16 – 2020-08-17 (×2): 1 [drp] via OPHTHALMIC
  Filled 2020-08-16: qty 2.5

## 2020-08-16 MED ORDER — PHENYLEPHRINE HCL-NACL 10-0.9 MG/250ML-% IV SOLN
INTRAVENOUS | Status: DC | PRN
  Administered 2020-08-16: 25 ug/min via INTRAVENOUS

## 2020-08-16 MED ORDER — SODIUM CHLORIDE 0.9% FLUSH
3.0000 mL | Freq: Two times a day (BID) | INTRAVENOUS | Status: DC
  Administered 2020-08-16: 3 mL via INTRAVENOUS

## 2020-08-16 MED ORDER — LIDOCAINE-EPINEPHRINE 1 %-1:100000 IJ SOLN
INTRAMUSCULAR | Status: AC
  Filled 2020-08-16: qty 1

## 2020-08-16 MED ORDER — LACTATED RINGERS IV SOLN: INTRAVENOUS | Status: DC

## 2020-08-16 MED ORDER — CEFAZOLIN SODIUM-DEXTROSE 2-4 GM/100ML-% IV SOLN
INTRAVENOUS | Status: AC
  Filled 2020-08-16: qty 100

## 2020-08-16 MED ORDER — DICYCLOMINE HCL 10 MG PO CAPS
20.0000 mg | ORAL_CAPSULE | Freq: Three times a day (TID) | ORAL | Status: DC
  Administered 2020-08-16 – 2020-08-18 (×3): 20 mg via ORAL
  Filled 2020-08-16 (×4): qty 2

## 2020-08-16 MED ORDER — SODIUM CHLORIDE 0.9 % IV SOLN
500.0000 mL | Freq: Once | INTRAVENOUS | Status: AC
Start: 2020-08-16 — End: 2020-08-16
  Administered 2020-08-16: 500 mL via INTRAVENOUS

## 2020-08-16 MED ORDER — MIDAZOLAM HCL 2 MG/2ML IJ SOLN
INTRAMUSCULAR | Status: DC | PRN
  Administered 2020-08-16: 2 mg via INTRAVENOUS

## 2020-08-16 MED ORDER — FLEET ENEMA 7-19 GM/118ML RE ENEM: 1.0000 | ENEMA | Freq: Once | RECTAL | Status: DC | PRN

## 2020-08-16 MED ORDER — HYDROMORPHONE HCL 2 MG PO TABS
2.0000 mg | ORAL_TABLET | ORAL | Status: DC | PRN
  Administered 2020-08-16 – 2020-08-18 (×10): 2 mg via ORAL
  Filled 2020-08-16 (×10): qty 1

## 2020-08-16 MED ORDER — ALUM & MAG HYDROXIDE-SIMETH 200-200-20 MG/5ML PO SUSP: 30.0000 mL | Freq: Four times a day (QID) | ORAL | Status: DC | PRN

## 2020-08-16 MED ORDER — DULAGLUTIDE 0.75 MG/0.5ML ~~LOC~~ SOAJ: 0.7500 mg | SUBCUTANEOUS | Status: DC

## 2020-08-16 MED ORDER — ONDANSETRON HCL 4 MG/2ML IJ SOLN
4.0000 mg | Freq: Four times a day (QID) | INTRAMUSCULAR | Status: DC | PRN
  Administered 2020-08-16: 4 mg via INTRAVENOUS

## 2020-08-16 MED ORDER — CELECOXIB 200 MG PO CAPS
200.0000 mg | ORAL_CAPSULE | Freq: Two times a day (BID) | ORAL | Status: DC
  Administered 2020-08-16 – 2020-08-18 (×4): 200 mg via ORAL
  Filled 2020-08-16 (×5): qty 1

## 2020-08-16 MED ORDER — 0.9 % SODIUM CHLORIDE (POUR BTL) OPTIME
TOPICAL | Status: DC | PRN
  Administered 2020-08-16: 1000 mL

## 2020-08-16 MED ORDER — ALPRAZOLAM 0.5 MG PO TABS
0.5000 mg | ORAL_TABLET | Freq: Three times a day (TID) | ORAL | Status: DC | PRN
  Administered 2020-08-16 – 2020-08-17 (×2): 0.5 mg via ORAL
  Filled 2020-08-16 (×2): qty 1

## 2020-08-16 MED ORDER — PROPOFOL 10 MG/ML IV BOLUS
INTRAVENOUS | Status: AC
  Filled 2020-08-16: qty 20

## 2020-08-16 MED ORDER — ZOLPIDEM TARTRATE 5 MG PO TABS: 5.0000 mg | ORAL_TABLET | Freq: Every evening | ORAL | Status: DC | PRN

## 2020-08-16 MED ORDER — LIDOCAINE 2% (20 MG/ML) 5 ML SYRINGE
INTRAMUSCULAR | Status: AC
  Filled 2020-08-16: qty 5

## 2020-08-16 MED ORDER — FENTANYL CITRATE (PF) 250 MCG/5ML IJ SOLN
INTRAMUSCULAR | Status: AC
  Filled 2020-08-16: qty 5

## 2020-08-16 MED ORDER — SCOPOLAMINE 1 MG/3DAYS TD PT72
MEDICATED_PATCH | TRANSDERMAL | Status: DC | PRN
  Administered 2020-08-16: 1 via TRANSDERMAL

## 2020-08-16 MED ORDER — CEFAZOLIN SODIUM-DEXTROSE 2-4 GM/100ML-% IV SOLN
2.0000 g | Freq: Three times a day (TID) | INTRAVENOUS | Status: AC
Start: 2020-08-16 — End: 2020-08-16
  Administered 2020-08-16 (×2): 2 g via INTRAVENOUS
  Filled 2020-08-16 (×2): qty 100

## 2020-08-16 MED ORDER — CEFAZOLIN SODIUM-DEXTROSE 2-4 GM/100ML-% IV SOLN
2.0000 g | INTRAVENOUS | Status: AC
  Administered 2020-08-16: 2 g via INTRAVENOUS

## 2020-08-16 MED ORDER — MENTHOL 3 MG MT LOZG: 1.0000 | LOZENGE | OROMUCOSAL | Status: DC | PRN

## 2020-08-16 MED ORDER — ADULT MULTIVITAMIN W/MINERALS CH
1.0000 | ORAL_TABLET | Freq: Every day | ORAL | Status: DC
  Administered 2020-08-16 – 2020-08-18 (×2): 1 via ORAL
  Filled 2020-08-16 (×4): qty 1

## 2020-08-16 MED ORDER — THROMBIN 5000 UNITS EX SOLR
CUTANEOUS | Status: DC | PRN
  Administered 2020-08-16 (×2): 5000 [IU] via TOPICAL

## 2020-08-16 MED ORDER — FEBUXOSTAT 40 MG PO TABS
40.0000 mg | ORAL_TABLET | Freq: Every day | ORAL | Status: DC
  Administered 2020-08-17 – 2020-08-18 (×2): 40 mg via ORAL
  Filled 2020-08-16 (×3): qty 1

## 2020-08-16 MED ORDER — SODIUM CHLORIDE 0.9 % IV SOLN
250.0000 mL | INTRAVENOUS | Status: DC
  Administered 2020-08-16: 250 mL via INTRAVENOUS

## 2020-08-16 MED ORDER — ORAL CARE MOUTH RINSE: 15.0000 mL | Freq: Once | OROMUCOSAL | Status: AC

## 2020-08-16 MED ORDER — LEVOTHYROXINE SODIUM 25 MCG PO TABS
50.0000 ug | ORAL_TABLET | Freq: Every day | ORAL | Status: DC
  Administered 2020-08-17 – 2020-08-18 (×2): 50 ug via ORAL
  Filled 2020-08-16 (×2): qty 2

## 2020-08-16 MED ORDER — MIDAZOLAM HCL 2 MG/2ML IJ SOLN
INTRAMUSCULAR | Status: AC
  Filled 2020-08-16: qty 2

## 2020-08-16 MED ORDER — HEMOSTATIC AGENTS (NO CHARGE) OPTIME
TOPICAL | Status: DC | PRN
  Administered 2020-08-16: 1 via TOPICAL

## 2020-08-16 MED ORDER — BISACODYL 10 MG RE SUPP: 10.0000 mg | Freq: Every day | RECTAL | Status: DC | PRN

## 2020-08-16 MED ORDER — BUPIVACAINE LIPOSOME 1.3 % IJ SUSP
INTRAMUSCULAR | Status: DC | PRN
  Administered 2020-08-16: 20 mL

## 2020-08-16 MED ORDER — ONDANSETRON HCL 4 MG/2ML IJ SOLN
INTRAMUSCULAR | Status: DC | PRN
  Administered 2020-08-16: 4 mg via INTRAVENOUS

## 2020-08-16 MED ORDER — INSULIN ASPART 100 UNIT/ML ~~LOC~~ SOLN: 0.0000 [IU] | Freq: Every day | SUBCUTANEOUS | Status: DC

## 2020-08-16 MED ORDER — ROCURONIUM BROMIDE 10 MG/ML (PF) SYRINGE
PREFILLED_SYRINGE | INTRAVENOUS | Status: DC | PRN
  Administered 2020-08-16 (×3): 20 mg via INTRAVENOUS
  Administered 2020-08-16: 60 mg via INTRAVENOUS

## 2020-08-16 MED ORDER — ROCURONIUM BROMIDE 10 MG/ML (PF) SYRINGE
PREFILLED_SYRINGE | INTRAVENOUS | Status: AC
  Filled 2020-08-16: qty 10

## 2020-08-16 MED ORDER — PRAVASTATIN SODIUM 10 MG PO TABS
20.0000 mg | ORAL_TABLET | Freq: Every evening | ORAL | Status: DC
  Administered 2020-08-16 – 2020-08-17 (×2): 20 mg via ORAL
  Filled 2020-08-16 (×2): qty 2

## 2020-08-16 MED ORDER — POLYETHYLENE GLYCOL 3350 17 G PO PACK
17.0000 g | PACK | Freq: Every day | ORAL | Status: DC | PRN
  Administered 2020-08-17: 17 g via ORAL
  Filled 2020-08-16: qty 1

## 2020-08-16 MED ORDER — DEXAMETHASONE SODIUM PHOSPHATE 10 MG/ML IJ SOLN
INTRAMUSCULAR | Status: DC | PRN
  Administered 2020-08-16: 10 mg via INTRAVENOUS

## 2020-08-16 MED ORDER — DOCUSATE SODIUM 100 MG PO CAPS
100.0000 mg | ORAL_CAPSULE | Freq: Two times a day (BID) | ORAL | Status: DC
  Administered 2020-08-16 – 2020-08-18 (×5): 100 mg via ORAL
  Filled 2020-08-16 (×5): qty 1

## 2020-08-16 MED ORDER — BUPIVACAINE HCL (PF) 0.5 % IJ SOLN
INTRAMUSCULAR | Status: DC | PRN
  Administered 2020-08-16: 5 mL

## 2020-08-16 MED ORDER — CHLORHEXIDINE GLUCONATE 0.12 % MT SOLN
OROMUCOSAL | Status: AC
  Administered 2020-08-16: 15 mL via OROMUCOSAL
  Filled 2020-08-16: qty 15

## 2020-08-16 SURGICAL SUPPLY — 79 items
ADH SKN CLS APL DERMABOND .7 (GAUZE/BANDAGES/DRESSINGS) ×1
BASKET BONE COLLECTION (BASKET) ×2 IMPLANT
BLADE CLIPPER SURG (BLADE) IMPLANT
BONE CANC CHIPS 20CC PCAN1/4 (Bone Implant) ×2 IMPLANT
BUR MATCHSTICK NEURO 3.0 LAGG (BURR) ×2 IMPLANT
BUR PRECISION FLUTE 5.0 (BURR) ×2 IMPLANT
CANISTER SUCT 3000ML PPV (MISCELLANEOUS) ×2 IMPLANT
CARTRIDGE OIL MAESTRO DRILL (MISCELLANEOUS) ×1 IMPLANT
CHIPS CANC BONE 20CC PCAN1/4 (Bone Implant) ×1 IMPLANT
CNTNR URN SCR LID CUP LEK RST (MISCELLANEOUS) ×1 IMPLANT
CONT SPEC 4OZ STRL OR WHT (MISCELLANEOUS) ×2
COVER BACK TABLE 24X17X13 BIG (DRAPES) IMPLANT
COVER BACK TABLE 60X90IN (DRAPES) ×2 IMPLANT
COVER WAND RF STERILE (DRAPES) ×2 IMPLANT
DECANTER SPIKE VIAL GLASS SM (MISCELLANEOUS) ×2 IMPLANT
DERMABOND ADVANCED (GAUZE/BANDAGES/DRESSINGS) ×1
DERMABOND ADVANCED .7 DNX12 (GAUZE/BANDAGES/DRESSINGS) ×1 IMPLANT
DIFFUSER DRILL AIR PNEUMATIC (MISCELLANEOUS) ×2 IMPLANT
DRAPE C-ARM 42X72 X-RAY (DRAPES) ×2 IMPLANT
DRAPE C-ARMOR (DRAPES) ×2 IMPLANT
DRAPE LAPAROTOMY 100X72X124 (DRAPES) ×2 IMPLANT
DRAPE SURG 17X23 STRL (DRAPES) ×2 IMPLANT
DURAPREP 26ML APPLICATOR (WOUND CARE) ×2 IMPLANT
ELECT BLADE 4.0 EZ CLEAN MEGAD (MISCELLANEOUS) ×2
ELECT REM PT RETURN 9FT ADLT (ELECTROSURGICAL) ×2
ELECTRODE BLDE 4.0 EZ CLN MEGD (MISCELLANEOUS) ×1 IMPLANT
ELECTRODE REM PT RTRN 9FT ADLT (ELECTROSURGICAL) ×1 IMPLANT
GAUZE 4X4 16PLY RFD (DISPOSABLE) IMPLANT
GAUZE SPONGE 4X4 12PLY STRL (GAUZE/BANDAGES/DRESSINGS) ×2 IMPLANT
GLOVE BIO SURGEON STRL SZ8 (GLOVE) ×12 IMPLANT
GLOVE BIOGEL PI IND STRL 8.5 (GLOVE) ×6 IMPLANT
GLOVE BIOGEL PI INDICATOR 8.5 (GLOVE) ×6
GLOVE ECLIPSE 8.0 STRL XLNG CF (GLOVE) ×8 IMPLANT
GLOVE EXAM NITRILE XL STR (GLOVE) IMPLANT
GLOVE SRG 8 PF TXTR STRL LF DI (GLOVE) ×4 IMPLANT
GLOVE SURG LTX SZ8 (GLOVE) ×8 IMPLANT
GLOVE SURG UNDER POLY LF SZ7.5 (GLOVE) ×8 IMPLANT
GLOVE SURG UNDER POLY LF SZ8 (GLOVE) ×8
GOWN STRL REUS W/ TWL LRG LVL3 (GOWN DISPOSABLE) ×1 IMPLANT
GOWN STRL REUS W/ TWL XL LVL3 (GOWN DISPOSABLE) ×4 IMPLANT
GOWN STRL REUS W/TWL 2XL LVL3 (GOWN DISPOSABLE) ×4 IMPLANT
GOWN STRL REUS W/TWL LRG LVL3 (GOWN DISPOSABLE) ×2
GOWN STRL REUS W/TWL XL LVL3 (GOWN DISPOSABLE) ×8
HEMOSTAT POWDER KIT SURGIFOAM (HEMOSTASIS) ×2 IMPLANT
IMPL TLX20 9X11X26 20D (Cage) ×1 IMPLANT
KIT BASIN OR (CUSTOM PROCEDURE TRAY) ×2 IMPLANT
KIT INFUSE X SMALL 1.4CC (Orthopedic Implant) ×2 IMPLANT
KIT POSITION SURG JACKSON T1 (MISCELLANEOUS) ×2 IMPLANT
KIT TURNOVER KIT B (KITS) ×2 IMPLANT
MILL MEDIUM DISP (BLADE) ×2 IMPLANT
NEEDLE HYPO 21X1.5 SAFETY (NEEDLE) ×2 IMPLANT
NEEDLE HYPO 25X1 1.5 SAFETY (NEEDLE) ×2 IMPLANT
NEEDLE SPNL 18GX3.5 QUINCKE PK (NEEDLE) ×2 IMPLANT
NS IRRIG 1000ML POUR BTL (IV SOLUTION) ×2 IMPLANT
OIL CARTRIDGE MAESTRO DRILL (MISCELLANEOUS) ×2
PACK LAMINECTOMY NEURO (CUSTOM PROCEDURE TRAY) ×2 IMPLANT
PAD ARMBOARD 7.5X6 YLW CONV (MISCELLANEOUS) ×6 IMPLANT
PATTIES SURGICAL .5 X.5 (GAUZE/BANDAGES/DRESSINGS) IMPLANT
PATTIES SURGICAL .5 X1 (DISPOSABLE) IMPLANT
PATTIES SURGICAL 1X1 (DISPOSABLE) IMPLANT
ROD RELINE O-H CON M 5.0/6.0MM (Rod) ×4 IMPLANT
ROD RELINE TI LORD 5.5X70 (Rod) ×4 IMPLANT
SCREW LOCK RELINE 5.5 TULIP (Screw) ×8 IMPLANT
SCREW RELINE-O POLY 5.5X40 (Screw) ×4 IMPLANT
SPONGE LAP 4X18 RFD (DISPOSABLE) IMPLANT
SPONGE SURGIFOAM ABS GEL SZ50 (HEMOSTASIS) ×2 IMPLANT
STAPLER SKIN PROX WIDE 3.9 (STAPLE) IMPLANT
SUT VIC AB 1 CT1 18XBRD ANBCTR (SUTURE) ×2 IMPLANT
SUT VIC AB 1 CT1 8-18 (SUTURE) ×4
SUT VIC AB 2-0 CT1 18 (SUTURE) ×4 IMPLANT
SUT VIC AB 3-0 SH 8-18 (SUTURE) ×4 IMPLANT
SYR 20CC LL (SYRINGE) ×2 IMPLANT
SYR 3ML LL SCALE MARK (SYRINGE) ×4 IMPLANT
SYR 5ML LL (SYRINGE) IMPLANT
TLX20 IMPLANT 9X11X26 20D (Cage) ×2 IMPLANT
TOWEL GREEN STERILE (TOWEL DISPOSABLE) ×2 IMPLANT
TOWEL GREEN STERILE FF (TOWEL DISPOSABLE) ×2 IMPLANT
TRAY FOLEY MTR SLVR 16FR STAT (SET/KITS/TRAYS/PACK) ×2 IMPLANT
WATER STERILE IRR 1000ML POUR (IV SOLUTION) ×2 IMPLANT

## 2020-08-16 NOTE — Progress Notes (Signed)
Awake, alert, conversant.  Full strength bilateral PF/DF/EHL.  Patient is doing well.

## 2020-08-16 NOTE — Brief Op Note (Signed)
08/16/2020  11:11 AM  PATIENT:  [name]  75 y.o. female  PRE-OPERATIVE DIAGNOSIS:  Spinal stenosis, Lumbar region without neurogenic claudication, herniated lumbar disc, lumbago, radiculopathy  POST-OPERATIVE DIAGNOSIS:  Spinal stenosis, Lumbar region without neurogenic claudication, herniated lumbar disc, lumbago, radiculopathy  PROCEDURE:  Procedure(s): Left Lumbar two-three Transforaminal lumbar interbody fusion with exploration and extension of adjacent level fusion (Left) with pedicle screw fixation and posterolateral arthrodesis  SURGEON:  Surgeon(s) and Role:    Erline Levine, MD - Primary  PHYSICIAN ASSISTANT: Glenford Peers, NP  ASSISTANTS: Poteat, RN   Mabe, MS 3   ANESTHESIA:   general  EBL:  200 mL   BLOOD ADMINISTERED:none  DRAINS: none   LOCAL MEDICATIONS USED:  MARCAINE    and LIDOCAINE   SPECIMEN:  No Specimen  DISPOSITION OF SPECIMEN:  N/A  COUNTS:  YES  TOURNIQUET:  * No tourniquets in log *  DICTATION: Patient is 75 year old woman with prior fusion at L3=S1 with lumbar disc herniation of L 23 level with stenosis affecting left greater than left leg. She has degeneration at L2/3 level as well.it was elected to take her to surgery for exploration of prior fusion with decompression and fusion at the L 23  Level utilizing an expandable TLIF cage.   Procedure: Patient was placed in a prone position on the Juliette table after smooth and uncomplicated induction of general endotracheal anesthesia. Her low back was prepped and draped in usual sterile fashion with betadine scrub and DuraPrep after marking areas of planned incision with C arm. Area of incision was infiltrated with local lidocaine. Incision was made to the lumbodorsal fascia was incised and exposure was performed of the previously placed hardware, which was cleared of investing bone.  There did not appear to be any loosening of screws and after careful exploration, there appeared to be good bridging  bone growth with no mobility at the previously operated level. Rod connectors were placed bilaterally between the L 3 and L 4 screws and torqued in place.  Intraoperative x-ray was obtained which confirmed correct orientation with marker probes at the L2 pedicle and overlying the L3 transverse process. A total left  hemi-laminectomy of L 2 was performed with disarticulation of the facet joints at this level and thorough decompression was performed of both L 2  and L 3 nerve roots along with the common dural tube. A thorough discectomy was initially performed on the left with preparation of the endplates for grafting a trial spacer was placed this level.   Bone autograft was packed within the interspace  along with extra small BMP kit. A 9 x 11 x 26 mm x 15 degree expandable titanium TLIF cage was  packed with BMP and autograft and was inserted the interspace and countersunk appropriately. The posterolateral region was extensively decorticated and pedicle probes were placed at L 2 and 5.5 x 40 mm screws were placed at L 2 bilaterally. Intraoperative fluoroscopy confirmed correct orientationin the AP and lateral plane.  Final x-rays demonstrated well-positioned interbody graft and pedicle screw fixation. A  82m lordotic rod was placed on the right and a 70 mm rod was placed on the left locked down in situ and the posterolateral region on the right was packed with the remaining BMP and bone autograft and 20 cc allograft chips on the right. Fascia was closed with 1 Vicryl sutures skin edges were reapproximated 2 and 3-0 Vicryl sutures. The wound is dressed with Dermabond and an occlusive dressing.  the patient was extubated in the operating room and taken to recovery in stable satisfactory condition. Counts were correct at the end of the case.    PLAN OF CARE: Admit to inpatient   PATIENT DISPOSITION:  PACU - hemodynamically stable.   Delay start of Pharmacological VTE agent (>24hrs) due to surgical blood loss  or risk of bleeding: yes

## 2020-08-16 NOTE — Op Note (Signed)
08/16/2020  11:11 AM  PATIENT:  [name]  75 y.o. female  PRE-OPERATIVE DIAGNOSIS:  Spinal stenosis, Lumbar region without neurogenic claudication, herniated lumbar disc, lumbago, radiculopathy  POST-OPERATIVE DIAGNOSIS:  Spinal stenosis, Lumbar region without neurogenic claudication, herniated lumbar disc, lumbago, radiculopathy  PROCEDURE:  Procedure(s): Left Lumbar two-three Transforaminal lumbar interbody fusion with exploration and extension of adjacent level fusion (Left) with pedicle screw fixation and posterolateral arthrodesis  SURGEON:  Surgeon(s) and Role:    Erline Levine, MD - Primary  PHYSICIAN ASSISTANT: Glenford Peers, NP  ASSISTANTS: Poteat, RN   Mabe, MS 3   ANESTHESIA:   general  EBL:  200 mL   BLOOD ADMINISTERED:none  DRAINS: none   LOCAL MEDICATIONS USED:  MARCAINE    and LIDOCAINE   SPECIMEN:  No Specimen  DISPOSITION OF SPECIMEN:  N/A  COUNTS:  YES  TOURNIQUET:  * No tourniquets in log *  DICTATION: Patient is 75 year old woman with prior fusion at L3=S1 with lumbar disc herniation of L 23 level with stenosis affecting left greater than left leg. She has degeneration at L2/3 level as well.it was elected to take her to surgery for exploration of prior fusion with decompression and fusion at the L 23  Level utilizing an expandable TLIF cage.   Procedure: Patient was placed in a prone position on the Becker table after smooth and uncomplicated induction of general endotracheal anesthesia. Her low back was prepped and draped in usual sterile fashion with betadine scrub and DuraPrep after marking areas of planned incision with C arm. Area of incision was infiltrated with local lidocaine. Incision was made to the lumbodorsal fascia was incised and exposure was performed of the previously placed hardware, which was cleared of investing bone.  There did not appear to be any loosening of screws and after careful exploration, there appeared to be good bridging  bone growth with no mobility at the previously operated level. Rod connectors were placed bilaterally between the L 3 and L 4 screws and torqued in place.  Intraoperative x-ray was obtained which confirmed correct orientation with marker probes at the L2 pedicle and overlying the L3 transverse process. A total left  hemi-laminectomy of L 2 was performed with disarticulation of the facet joints at this level and thorough decompression was performed of both L 2  and L 3 nerve roots along with the common dural tube. A thorough discectomy was initially performed on the left with preparation of the endplates for grafting a trial spacer was placed this level.   Bone autograft was packed within the interspace  along with extra small BMP kit. A 9 x 11 x 26 mm x 15 degree expandable titanium TLIF cage was  packed with BMP and autograft and was inserted the interspace and countersunk appropriately. The posterolateral region was extensively decorticated and pedicle probes were placed at L 2 and 5.5 x 40 mm screws were placed at L 2 bilaterally. Intraoperative fluoroscopy confirmed correct orientationin the AP and lateral plane.  Final x-rays demonstrated well-positioned interbody graft and pedicle screw fixation. A  19m lordotic rod was placed on the right and a 70 mm rod was placed on the left locked down in situ and the posterolateral region on the right was packed with the remaining BMP and bone autograft and 20 cc allograft chips on the right. Fascia was closed with 1 Vicryl sutures skin edges were reapproximated 2 and 3-0 Vicryl sutures. The wound is dressed with Dermabond and an occlusive dressing.  the patient was extubated in the operating room and taken to recovery in stable satisfactory condition. Counts were correct at the end of the case.    PLAN OF CARE: Admit to inpatient   PATIENT DISPOSITION:  PACU - hemodynamically stable.   Delay start of Pharmacological VTE agent (>24hrs) due to surgical blood loss  or risk of bleeding: yes

## 2020-08-16 NOTE — Transfer of Care (Signed)
Immediate Anesthesia Transfer of Care Note  Patient: [name]  Procedure(s) Performed: Left Lumbar two-three Transforaminal lumbar interbody fusion with exploration and extension of adjacent level fusion (Left Spine Lumbar)  Patient Location: PACU  Anesthesia Type:General  Level of Consciousness: lethargic and responds to stimulation  Airway & Oxygen Therapy: Patient Spontanous Breathing and Patient connected to face mask oxygen  Post-op Assessment: Report given to RN  Post vital signs: Reviewed and stable  Last Vitals:  Vitals Value Taken Time  BP 118/72 08/16/20 1118  Temp    Pulse 59 08/16/20 1119  Resp 19 08/16/20 1119  SpO2 100 % 08/16/20 1119  Vitals shown include unvalidated device data.  Last Pain:  Vitals:   08/16/20 0658  TempSrc:   PainSc: 8          Complications: No complications documented.

## 2020-08-16 NOTE — Anesthesia Postprocedure Evaluation (Signed)
Anesthesia Post Note  Patient: [name]  Procedure(s) Performed: Left Lumbar two-three Transforaminal lumbar interbody fusion with exploration and extension of adjacent level fusion (Left Spine Lumbar)     Patient location during evaluation: PACU Anesthesia Type: General Level of consciousness: sedated and patient cooperative Pain management: pain level controlled Vital Signs Assessment: post-procedure vital signs reviewed and stable Respiratory status: spontaneous breathing Cardiovascular status: stable Anesthetic complications: no   No complications documented.  Last Vitals:  Vitals:   08/16/20 1234 08/16/20 1554  BP: 126/81 106/69  Pulse: 89 60  Resp: 18 17  Temp: (!) 36.3 C (!) 36.3 C  SpO2: 99% 97%    Last Pain:  Vitals:   08/16/20 1554  TempSrc: Oral  PainSc:                  Nolon Nations

## 2020-08-16 NOTE — Progress Notes (Signed)
Orthopedic Tech Progress Note Patient Details:  [name] 1945/09/04 [id number] Patient has BRACE Patient ID: [name], female   DOB: January 14, 1946, 75 y.o.   MRN: [id number]   [name] 08/16/2020, 1:26 PM

## 2020-08-16 NOTE — Anesthesia Procedure Notes (Addendum)
Procedure Name: Intubation Date/Time: 08/16/2020 7:49 AM Performed by: Barrington Ellison, CRNA Pre-anesthesia Checklist: Patient identified, Emergency Drugs available, Suction available and Patient being monitored Patient Re-evaluated:Patient Re-evaluated prior to induction Oxygen Delivery Method: Circle System Utilized Preoxygenation: Pre-oxygenation with 100% oxygen Induction Type: IV induction Ventilation: Mask ventilation without difficulty and Oral airway inserted - appropriate to patient size Laryngoscope Size: Mac and 3 Grade View: Grade I Tube type: Oral Tube size: 7.0 mm Number of attempts: 1 Airway Equipment and Method: Stylet and Oral airway Placement Confirmation: ETT inserted through vocal cords under direct vision,  positive ETCO2 and breath sounds checked- equal and bilateral Secured at: 21 cm Tube secured with: Tape Dental Injury: Teeth and Oropharynx as per pre-operative assessment

## 2020-08-16 NOTE — Interval H&P Note (Signed)
History and Physical Interval Note:  08/16/2020 7:16 AM  [name]  has presented today for surgery, with the diagnosis of Spinal stenosis, Lumbar region without neurogenic claudication.  The various methods of treatment have been discussed with the patient and family. After consideration of risks, benefits and other options for treatment, the patient has consented to  Procedure(s) with comments: Left Lumbar 2-3 Transforaminal lumbar interbody fusion with exploration and extension of adjacent level fusion (Left) - 3C/RM 21 as a surgical intervention.  The patient's history has been reviewed, patient examined, no change in status, stable for surgery.  I have reviewed the patient's chart and labs.  Questions were answered to the patient's satisfaction.     [name]

## 2020-08-17 ENCOUNTER — Encounter (HOSPITAL_COMMUNITY): Payer: Self-pay | Admitting: Neurosurgery

## 2020-08-17 LAB — GLUCOSE, CAPILLARY
Glucose-Capillary: 115 mg/dL — ABNORMAL HIGH (ref 70–99)
Glucose-Capillary: 125 mg/dL — ABNORMAL HIGH (ref 70–99)
Glucose-Capillary: 116 mg/dL — ABNORMAL HIGH (ref 70–99)
Glucose-Capillary: 127 mg/dL — ABNORMAL HIGH (ref 70–99)

## 2020-08-17 MED FILL — Sodium Chloride IV Soln 0.9%: INTRAVENOUS | Qty: 1000 | Status: AC

## 2020-08-17 MED FILL — Heparin Sodium (Porcine) Inj 1000 Unit/ML: INTRAMUSCULAR | Qty: 30 | Status: AC

## 2020-08-17 NOTE — Evaluation (Signed)
Physical Therapy Evaluation Patient Details Name: [name] MRN: [id number] DOB: 12/23/1945 Today's Date: 08/17/2020   History of Present Illness  The pt is a 75 yo female presenting 4/19 for L2-3 TLIF due to ongoing pain in low back, L hip, and L lateral thigh. PMH includes: anxiety, DM, CKD, HTN, OA, prior back surgery, R rotator cuff repair, R TKA.    Clinical Impression  Pt in bed upon arrival of PT, agreeable to evaluation at this time. Prior to admission the pt was independent with mobility and ADLs, living alone without issues, but limited by pain. The pt now presents with limitations in functional mobility, strength, endurance, and dynamic stability due to above dx and resulting pain, and will continue to benefit from skilled PT to address these deficits. The pt was able to complete sit-stand transfers and hallway ambulation with use of RW, but no additional assist or instability at this time. The pt was also able to manage stairs with use of single UE support on rail or minA through HHA with out use of rail. The pt was educated on spinal precautions, home walking program, fall risk reduction at home, and application of spinal precautions to mobility. The pt will continue to benefit from skilled PT to progress independence, stability, strength, and activity tolerance to facilitate return to prior level of mobility and independence.     Follow Up Recommendations Home health PT    Equipment Recommendations  None recommended by PT    Recommendations for Other Services       Precautions / Restrictions Precautions Precautions: Back;Fall Precaution Booklet Issued: Yes (comment) Precaution Comments: discussed verbally, pt able to recall 2/3 without cues Required Braces or Orthoses: Spinal Brace Spinal Brace: Lumbar corset;Applied in sitting position Restrictions Weight Bearing Restrictions: No      Mobility  Bed Mobility Overal bed mobility: Needs Assistance Bed Mobility:  Rolling;Sidelying to Sit;Sit to Sidelying Rolling: Min guard Sidelying to sit: Min guard     Sit to sidelying: Min guard General bed mobility comments: cues for log roll, pt able to complete with flat bed    Transfers Overall transfer level: Needs assistance Equipment used: Rolling walker (2 wheeled) Transfers: Sit to/from Stand Sit to Stand: Modified independent (Device/Increase time)         General transfer comment: increased time and effort, cues for hand positioning, but no assistance needed  Ambulation/Gait Ambulation/Gait assistance: Modified independent (Device/Increase time) Gait Distance (Feet): 300 Feet Assistive device: Rolling walker (2 wheeled) Gait Pattern/deviations: Step-through pattern;Decreased stride length Gait velocity: 0.3 m/s Gait velocity interpretation: <1.31 ft/sec, indicative of household ambulator General Gait Details: slowed gait with cues for positioning in RW, no physical assist or LOB  Stairs Stairs: Yes Stairs assistance: Min guard;Min assist Stair Management: One rail Right;No rails;Forwards Number of Stairs: 1 (x4) General stair comments: minG with rail, minA without rail     Balance Overall balance assessment: Mild deficits observed, not formally tested                                           Pertinent Vitals/Pain Pain Assessment: No/denies pain Pain Score: 0-No pain Faces Pain Scale: No hurt Pain Intervention(s): Monitored during session    Home Living Family/patient expects to be discharged to:: Private residence Living Arrangements: Alone Available Help at Discharge: Family;Friend(s);Available PRN/intermittently Type of Home: House Home Access: Stairs to enter Entrance  Stairs-Rails: None Entrance Stairs-Number of Steps: 2 Home Layout: One level Home Equipment: Walker - 2 wheels;Cane - single point;Bedside commode;Shower seat      Prior Function Level of Independence: Independent          Comments: limited by pain     Hand Dominance        Extremity/Trunk Assessment   Upper Extremity Assessment Upper Extremity Assessment: Overall WFL for tasks assessed    Lower Extremity Assessment Lower Extremity Assessment: Overall WFL for tasks assessed (pt denies difference in sensation, no difference in MMT)    Cervical / Trunk Assessment Cervical / Trunk Assessment: Other exceptions Cervical / Trunk Exceptions: s/p spine surgery  Communication   Communication: No difficulties  Cognition Arousal/Alertness: Awake/alert Behavior During Therapy: WFL for tasks assessed/performed Overall Cognitive Status: Within Functional Limits for tasks assessed                                               Assessment/Plan    PT Assessment Patient needs continued PT services  PT Problem List Decreased strength;Decreased activity tolerance;Decreased balance;Decreased mobility;Pain       PT Treatment Interventions      PT Goals (Current goals can be found in the Care Plan section)  Acute Rehab PT Goals Patient Stated Goal: return home PT Goal Formulation: With patient Time For Goal Achievement: 08/31/20 Potential to Achieve Goals: Good    Frequency Min 5X/week    AM-PAC PT "6 Clicks" Mobility  Outcome Measure Help needed turning from your back to your side while in a flat bed without using bedrails?: A Little Help needed moving from lying on your back to sitting on the side of a flat bed without using bedrails?: A Little Help needed moving to and from a bed to a chair (including a wheelchair)?: A Little Help needed standing up from a chair using your arms (e.g., wheelchair or bedside chair)?: A Little Help needed to walk in hospital room?: A Little Help needed climbing 3-5 steps with a railing? : A Little 6 Click Score: 18    End of Session Equipment Utilized During Treatment: Back brace Activity Tolerance: Patient tolerated treatment well;No  increased pain Patient left: in bed;with call bell/phone within reach Nurse Communication: Mobility status PT Visit Diagnosis: Other abnormalities of gait and mobility (R26.89);Pain Pain - part of body:  (back)    Time: 5400-8676 PT Time Calculation (min) (ACUTE ONLY): 20 min   Charges:   PT Evaluation $PT Eval Low Complexity: 1 Low          Karma Ganja, PT, DPT   Acute Rehabilitation Department Pager #: [phone]  Otho Bellows 08/17/2020, 9:33 AM

## 2020-08-17 NOTE — Progress Notes (Signed)
Subjective: Patient reports that she has had a resolution of her preoperative symptoms. Her only complaint is of mild incisional discomfort. No acute events overnight.   Objective: Vital signs in last 24 hours: Temp:  [97.2 F (36.2 C)-97.8 F (36.6 C)] 97.8 F (36.6 C) (04/20 0724) Pulse Rate:  [53-89] 57 (04/20 0724) Resp:  [15-20] 16 (04/20 0724) BP: (100-131)/(54-97) 100/57 (04/20 0724) SpO2:  [93 %-100 %] 96 % (04/20 0724)  Intake/Output from previous day: 04/19 0701 - 04/20 0700 In: 2320 [P.O.:120; I.V.:2200] Out: 610 [Urine:410; Blood:200] Intake/Output this shift: No intake/output data recorded.  Physical Exam: Patient is awake, A/O X 4, conversant, and in good spirits. Speech is fluent and appropriate. Doing well. MAEW with good strength that is symmetric bilaterally. 5/5 BUE/BLE.   Dressing is clean dry intact. Incision is well approximated with no drainage, erythema, or edema.    Lab Results: No results for input(s): WBC, HGB, HCT, PLT in the last 72 hours. BMET No results for input(s): NA, K, CL, CO2, GLUCOSE, BUN, CREATININE, CALCIUM in the last 72 hours.  Studies/Results: DG Lumbar Spine 2-3 Views  Result Date: 08/16/2020 CLINICAL DATA:  PLIF L2-L3 EXAM: LUMBAR SPINE - 2-3 VIEW; DG C-ARM 1-60 MIN COMPARISON:  MR lumbar spine 05/12/2020 FLUOROSCOPY TIME:  0 minutes 28 seconds Images: 4 FINDINGS: Prior MR demonstrates orthopedic hardware from prior posterior fusion of L3-S1. Intraoperative images demonstrate placement of additional surgical hardware at L2-L3 posteriorly as well as a disc prosthesis. IMPRESSION: Intraoperative images during posterior fusion L2-L3. Electronically Signed   By: Lavonia Dana M.D.   On: 08/16/2020 12:19   DG C-Arm 1-60 Min  Result Date: 08/16/2020 CLINICAL DATA:  PLIF L2-L3 EXAM: LUMBAR SPINE - 2-3 VIEW; DG C-ARM 1-60 MIN COMPARISON:  MR lumbar spine 05/12/2020 FLUOROSCOPY TIME:  0 minutes 28 seconds Images: 4 FINDINGS: Prior MR  demonstrates orthopedic hardware from prior posterior fusion of L3-S1. Intraoperative images demonstrate placement of additional surgical hardware at L2-L3 posteriorly as well as a disc prosthesis. IMPRESSION: Intraoperative images during posterior fusion L2-L3. Electronically Signed   By: Lavonia Dana M.D.   On: 08/16/2020 12:19    Assessment/Plan: Patient is post-op day 1 s/p left L2/3 TLIF. She is recovering well and reports a resolution of her preoperative symptoms.  Her only complaint is mild incisional discomfort.  She has ambulated with nursing staff. She is awaiting PT/OT evaluation.  Continue LSO brace when OOB. Continue working on pain control, mobility and ambulating patient. The patient's sister unexpectedly passed away yesterday, who was also her primary support person for the patient after discharge. Will plan for discharge tomorrow and allow the patient to assemble a new support structure for her discharge and home care.   LOS: 1 day     Marvis Moeller, DNP, NP-C 08/17/2020, 8:18 AM

## 2020-08-17 NOTE — Evaluation (Signed)
Occupational Therapy Evaluation Patient Details Name: [name] MRN: [id number] DOB: 11-27-1945 Today's Date: 08/17/2020    History of Present Illness The pt is a 75 yo female presenting 4/19 for L2-3 TLIF due to ongoing pain in low back, L hip, and L lateral thigh. PMH includes: anxiety, DM, CKD, HTN, OA, prior back surgery, R rotator cuff repair, R TKA.   Clinical Impression   PTA patient independent and driving. Admitted for above and presenting with problem list below, including pain, decreased activity tolerance, decreased functional reach to BLEs and back precautions.  She was educated on back precautions, brace mgmt, recommendations, AE/DME and safety.  She requires supervision for UB ADLs, mod assist for LB ADLs and supervision for transfers/ in room mobility using RW.  She requires min cueing for precaution adherence, verbally reviewed use of AE (patient has reacher, but further education on sock aide will be beneficial).  Based on performance today, believe pt will benefit from further OT services while admitted to optimize independence with ADLs, but anticipate no further needs after dc home.     Follow Up Recommendations  No OT follow up;Supervision - Intermittent    Equipment Recommendations  None recommended by OT    Recommendations for Other Services       Precautions / Restrictions Precautions Precautions: Back;Fall Precaution Booklet Issued: Yes (comment) Precaution Comments: discussed verbally, requires min cueing to recall and adhere functionally Required Braces or Orthoses: Spinal Brace Spinal Brace: Lumbar corset;Applied in sitting position Restrictions Weight Bearing Restrictions: No      Mobility Bed Mobility Overal bed mobility: Needs Assistance Bed Mobility: Rolling;Sidelying to Sit;Sit to Sidelying Rolling: Min guard Sidelying to sit: Min guard     Sit to sidelying: Min guard General bed mobility comments: EOB upon entry    Transfers Overall  transfer level: Needs assistance Equipment used: Rolling walker (2 wheeled) Transfers: Sit to/from Stand Sit to Stand: Supervision         General transfer comment: supervision for safety and cueing for hand placement/posture, but no physical assist required    Balance Overall balance assessment: Mild deficits observed, not formally tested                                         ADL either performed or assessed with clinical judgement   ADL Overall ADL's : Needs assistance/impaired     Grooming: Supervision/safety;Cueing for compensatory techniques;Standing   Upper Body Bathing: Set up;Sitting   Lower Body Bathing: Minimal assistance;Sit to/from stand;Cueing for back precautions   Upper Body Dressing : Supervision/safety;Sitting Upper Body Dressing Details (indicate cue type and reason): for back brace Lower Body Dressing: Moderate assistance;Sit to/from stand;Cueing for compensatory techniques Lower Body Dressing Details (indicate cue type and reason): unable to complete figure 4 technique, educated on AE and will benefit from review of sock aide. supervision sit to stand Toilet Transfer: Supervision/safety;Ambulation;RW           Functional mobility during ADLs: Supervision/safety;Rolling walker;Cueing for safety General ADL Comments: pt educated on compensatory techniques for ADLs, back precautions and brace     Vision         Perception     Praxis      Pertinent Vitals/Pain Pain Assessment: Faces Pain Score: 0-No pain Faces Pain Scale: Hurts a little bit Pain Location: back Pain Descriptors / Indicators: Operative site guarding Pain Intervention(s): Monitored during session;Repositioned  Hand Dominance     Extremity/Trunk Assessment Upper Extremity Assessment Upper Extremity Assessment: Overall WFL for tasks assessed   Lower Extremity Assessment Lower Extremity Assessment: Defer to PT evaluation   Cervical / Trunk  Assessment Cervical / Trunk Assessment: Other exceptions Cervical / Trunk Exceptions: s/p spine surgery   Communication Communication Communication: No difficulties   Cognition Arousal/Alertness: Awake/alert Behavior During Therapy: WFL for tasks assessed/performed Overall Cognitive Status: Within Functional Limits for tasks assessed                                     General Comments       Exercises     Shoulder Instructions      Home Living Family/patient expects to be discharged to:: Private residence Living Arrangements: Alone Available Help at Discharge: Family;Friend(s);Available PRN/intermittently Type of Home: House Home Access: Stairs to enter [employer] of Steps: 2 Entrance Stairs-Rails: None Home Layout: One level     Bathroom Shower/Tub: Occupational psychologist: Handicapped height     Home Equipment: Environmental consultant - 2 wheels;Cane - single point;Bedside commode;Shower seat;Grab bars - tub/shower;Hand held shower head          Prior Functioning/Environment Level of Independence: Independent        Comments: limited by pain        OT Problem List: Impaired balance (sitting and/or standing);Pain;Decreased knowledge of precautions;Decreased knowledge of use of DME or AE;Obesity      OT Treatment/Interventions: Self-care/ADL training;DME and/or AE instruction;Therapeutic activities;Patient/family education    OT Goals(Current goals can be found in the care plan section) Acute Rehab OT Goals Patient Stated Goal: return home OT Goal Formulation: With patient Time For Goal Achievement: 08/31/20 Potential to Achieve Goals: Good  OT Frequency: Min 2X/week   Barriers to D/C:            Co-evaluation              AM-PAC OT "6 Clicks" Daily Activity     Outcome Measure Help from another person eating meals?: None Help from another person taking care of personal grooming?: A Little Help from another person  toileting, which includes using toliet, bedpan, or urinal?: A Little Help from another person bathing (including washing, rinsing, drying)?: A Little Help from another person to put on and taking off regular upper body clothing?: A Little Help from another person to put on and taking off regular lower body clothing?: A Lot 6 Click Score: 18   End of Session Equipment Utilized During Treatment: Rolling walker;Back brace Nurse Communication: Mobility status  Activity Tolerance: Patient tolerated treatment well Patient left: with call bell/phone within reach;Other (comment) (seated EOB)  OT Visit Diagnosis: Other abnormalities of gait and mobility (R26.89);Pain Pain - part of body:  (back-incisional)                Time: 7253-6644 OT Time Calculation (min): 15 min Charges:  OT General Charges $OT Visit: 1 Visit OT Evaluation $OT Eval Low Complexity: 1 Low  Jolaine Artist, OT Acute Rehabilitation Services Pager [phone] Office [phone]   Delight Stare 08/17/2020, 10:30 AM

## 2020-08-17 NOTE — Discharge Instructions (Signed)
Wound Care Remove dressing in 3 days Leave incision open to air. You may shower. Do not scrub directly on incision.  Do not put any creams, lotions, or ointments on incision. Activity Walk each and every day, increasing distance each day. No lifting greater than 5 lbs.  Avoid bending, arching, and twisting. No driving for 2 weeks; may ride as a passenger locally. If provided with back brace, wear when out of bed.  It is not necessary to wear in bed. Diet Resume your normal diet.  . Call Your Doctor If Any of These Occur Redness, drainage, or swelling at the wound.  Temperature greater than 101 degrees. Severe pain not relieved by pain medication. Incision starts to come apart. Follow Up Appt Call today for appointment in 2-3 weeks ([phone]) or for problems.  If you have any hardware placed in your spine, you will need an x-ray before your appointment.

## 2020-08-18 LAB — GLUCOSE, CAPILLARY: Glucose-Capillary: 119 mg/dL — ABNORMAL HIGH (ref 70–99)

## 2020-08-18 MED ORDER — HYDROMORPHONE HCL 2 MG PO TABS: 1.0000 mg | ORAL_TABLET | ORAL | 0 refills | Status: DC | PRN

## 2020-08-18 MED ORDER — METHOCARBAMOL 500 MG PO TABS: 500.0000 mg | ORAL_TABLET | Freq: Four times a day (QID) | ORAL | 0 refills | Status: AC | PRN

## 2020-08-18 NOTE — Progress Notes (Signed)
Occupational Therapy Treatment Patient Details Name: [name] MRN: [id number] DOB: [birth year] Today's Date: 08/18/2020    History of present illness The pt is a 75 yo female presenting 4/19 for L2-3 TLIF due to ongoing pain in low back, L hip, and L lateral thigh. PMH includes: anxiety, DM, CKD, HTN, OA, prior back surgery, R rotator cuff repair, R TKA.   OT comments  Patient progressing well towards goals.  Educated on LB AE, completed LB dressing with supervision using sock aide and reacher.  Completed simulated tub transfers with min guard assist, reports will have assist for transfers at dc for several weeks (if no assist plans to basin bathe).  Continues to require min cueing for functional adherence of back precautions and hand placement during transfers but improved carryover towards end of session.  Will follow acutely.    Follow Up Recommendations  No OT follow up;Supervision - Intermittent    Equipment Recommendations  None recommended by OT    Recommendations for Other Services      Precautions / Restrictions Precautions Precautions: Back;Fall Precaution Booklet Issued: Yes (comment) Precaution Comments: discussed verbally, requires min cueing to recall and adhere functionally Required Braces or Orthoses: Spinal Brace Spinal Brace: Lumbar corset;Applied in sitting position Restrictions Weight Bearing Restrictions: No       Mobility Bed Mobility Overal bed mobility: Needs Assistance Bed Mobility: Rolling;Sidelying to Sit Rolling: Supervision Sidelying to sit: Supervision     Sit to sidelying: Supervision General bed mobility comments: cueing for log roll technique but no assist required    Transfers Overall transfer level: Needs assistance Equipment used: Rolling walker (2 wheeled) Transfers: Sit to/from Stand Sit to Stand: Supervision         General transfer comment: cueing for hand placement and posture    Balance Overall balance assessment:  Mild deficits observed, not formally tested                                         ADL either performed or assessed with clinical judgement   ADL Overall ADL's : Needs assistance/impaired     Grooming: Supervision/safety;Standing;Wash/dry hands           Upper Body Dressing : Supervision/safety;Sitting   Lower Body Dressing: Supervision/safety;With adaptive equipment;Sit to/from stand;Cueing for compensatory techniques;Cueing for back precautions Lower Body Dressing Details (indicate cue type and reason): reviewed use of AE, donning clothing with supervision using reacher Toilet Transfer: Supervision/safety;Ambulation;RW   Toileting- Clothing Manipulation and Hygiene: Supervision/safety;Sit to/from stand;Cueing for compensatory techniques   Tub/ Shower Transfer: Tub transfer;Min guard;Ambulation;Rolling walker;Shower seat;Grab Paediatric nurse Details (indicate cue type and reason): simulated technique, educated on reverse step over threshold using RW as pt cannot reach grabbar until in tub. plans to have assist with transfer at home Functional mobility during ADLs: Supervision/safety;Rolling walker;Cueing for safety       Vision       Perception     Praxis      Cognition Arousal/Alertness: Awake/alert Behavior During Therapy: WFL for tasks assessed/performed Overall Cognitive Status: Within Functional Limits for tasks assessed                                          Exercises     Shoulder Instructions       General  Comments      Pertinent Vitals/ Pain       Pain Assessment: Faces Pain Score: 3  Faces Pain Scale: Hurts a little bit Pain Location: back Pain Descriptors / Indicators: Operative site guarding Pain Intervention(s): Limited activity within patient's tolerance;Monitored during session;Repositioned  Home Living                                          Prior Functioning/Environment               Frequency  Min 2X/week        Progress Toward Goals  OT Goals(current goals can now be found in the care plan section)  Progress towards OT goals: Progressing toward goals  Acute Rehab OT Goals Patient Stated Goal: return home OT Goal Formulation: With patient  Plan Discharge plan remains appropriate;Frequency remains appropriate    Co-evaluation                 AM-PAC OT "6 Clicks" Daily Activity     Outcome Measure   Help from another person eating meals?: None Help from another person taking care of personal grooming?: A Little Help from another person toileting, which includes using toliet, bedpan, or urinal?: A Little Help from another person bathing (including washing, rinsing, drying)?: A Little Help from another person to put on and taking off regular upper body clothing?: A Little Help from another person to put on and taking off regular lower body clothing?: A Little 6 Click Score: 19    End of Session Equipment Utilized During Treatment: Rolling walker;Back brace  OT Visit Diagnosis: Other abnormalities of gait and mobility (R26.89);Pain Pain - part of body:  (back)   Activity Tolerance Patient tolerated treatment well   Patient Left with call bell/phone within reach;Other (comment) (seated EOB)   Nurse Communication Mobility status        Time: 7209-4709 OT Time Calculation (min): 20 min  Charges: OT General Charges $OT Visit: 1 Visit OT Treatments $Self Care/Home Management : 8-22 mins  Jolaine Artist, OT Acute Rehabilitation Services Pager [phone] Office [phone]    [name] 08/18/2020, 10:45 AM

## 2020-08-18 NOTE — Discharge Summary (Signed)
Physician Discharge Summary  Patient ID: [name] MRN: [id number] DOB/AGE: [birth year] 74 y.o.  Admit date: 08/16/2020 Discharge date: 08/18/2020  Admission Diagnoses: Spinal stenosis, Lumbar region without neurogenic claudication, herniated lumbar disc, lumbago, radiculopathy  Discharge Diagnoses: Spinal stenosis, Lumbar region without neurogenic claudication, herniated lumbar disc, lumbago, radiculopathy Active Problems:   Degenerative lumbar spinal stenosis   Discharged Condition: good   Hospital Course: The patient was admitted on 08/16/2020 and taken to the operating room where the patient underwent left L2/3 TLIF. The patient tolerated the procedure well and was taken to the recovery room and then to the floor in stable condition. The hospital course was routine. There were no complications. The wound remained clean dry and intact. Pt had appropriate back soreness. No complaints of leg pain or new N/T/W. The patient remained afebrile with stable vital signs, and tolerated a regular diet. The patient continued to increase activities, and pain was well controlled with oral pain medications.   Consults: None  Significant Diagnostic Studies: radiology: X-ray  Treatments: surgery: Left Lumbar two-three Transforaminal lumbar interbody fusion with exploration and extension of adjacent level fusion (Left) with pedicle screw fixation and posterolateral arthrodesis   Discharge Exam: Blood pressure (!) 111/55, pulse 75, temperature 97.9 F (36.6 C), temperature source Oral, resp. rate 20, height 5\' 3"  (1.6 m), weight 108.9 kg, SpO2 97 %..  Physical Exam: Patient is awake, A/O X 4, conversant, and in good spirits. Speech is fluent and appropriate. Doing well. MAEW with good strength that is symmetric bilaterally.   Dressing is clean dry intact. Incision is well approximated with no drainage, erythema, or edema.     Disposition: Discharge disposition: 01-Home or Self  Care        Allergies as of 08/18/2020      Reactions   Contrast Media [iodinated Diagnostic Agents] Shortness Of Breath, Other (See Comments)   bp elevated   Adhesive [tape] Other (See Comments)   Pulls skin off   Esomeprazole Magnesium Hives   Morphine And Related Other (See Comments)   Severe headache  chills   Talwin [pentazocine] Other (See Comments)   hallucinations   Acetaminophen    Other reaction(s): Stomach pains,vomiting   Allopurinol Diarrhea   Codeine Nausea And Vomiting   Colchicine Diarrhea   Hydrocodone Nausea And Vomiting   Meperidine Hcl Nausea And Vomiting   Oxycodone Nausea And Vomiting   Pentazocine Lactate Nausea And Vomiting      Medication List    TAKE these medications   ALPRAZolam 0.5 MG tablet Commonly known as: XANAX Take 0.5 mg by mouth 3 (three) times daily as needed for anxiety.   aspirin 81 MG chewable tablet Chew 81 mg by mouth daily.   atenolol 25 MG tablet Commonly known as: TENORMIN Take 25 mg by mouth daily.   dicyclomine 10 MG capsule Commonly known as: BENTYL Take 1 capsule (10 mg total) by mouth 3 (three) times daily before meals. What changed: how much to take   febuxostat 40 MG tablet Commonly known as: ULORIC Take 40 mg by mouth daily.   HYDROmorphone 2 MG tablet Commonly known as: DILAUDID Take 0.5-1 tablets (1-2 mg total) by mouth every 4 (four) hours as needed for severe pain.   latanoprost 0.005 % ophthalmic solution Commonly known as: XALATAN Place 1 drop into both eyes at bedtime.   levothyroxine 50 MCG tablet Commonly known as: SYNTHROID Take 50-100 mcg by mouth See admin instructions. Take 50 mcg daily, except 100 mcg by  mouth on Sunday   methocarbamol 500 MG tablet Commonly known as: ROBAXIN Take 1 tablet (500 mg total) by mouth every 6 (six) hours as needed for muscle spasms.   multivitamin with minerals tablet Take 1 tablet by mouth daily.   pravastatin 20 MG tablet Commonly known as:  PRAVACHOL Take 20 mg by mouth every evening.   telmisartan 40 MG tablet Commonly known as: MICARDIS Take 40 mg by mouth daily.   Trulicity 1.19 ER/7.4YC Sopn Generic drug: Dulaglutide Inject 0.75 mg into the skin every Sunday.       Follow-up Information    Erline Levine, MD Follow up.   Specialty: Neurosurgery Contact information: [address] [place] [address] [phone]               Signed: Marvis Moeller, DNP, NP-C 08/18/2020, 8:20 AM

## 2020-08-18 NOTE — Progress Notes (Signed)
Subjective: Patient reports that she is continuing to do well and only has complaints of mild incisional discomfort. She continues to be pleased with her postoperative status. No acute events overnight.   Objective: Vital signs in last 24 hours: Temp:  [97.9 F (36.6 C)-98.5 F (36.9 C)] 98.1 F (36.7 C) (04/21 0322) Pulse Rate:  [63-78] 78 (04/21 0322) Resp:  [16-20] 18 (04/21 0322) BP: (101-106)/(52-64) 101/60 (04/21 0322) SpO2:  [93 %-98 %] 97 % (04/21 0322)  Intake/Output from previous day: No intake/output data recorded. Intake/Output this shift: No intake/output data recorded.  Physical Exam: Patient is awake, A/O X 4, conversant, and in good spirits. Speech is fluent and appropriate. Doing well. MAEW with good strength that is symmetric bilaterally. Dressing is clean dry intact. Incision is well approximated with no drainage, erythema, or edema.      Lab Results: No results for input(s): WBC, HGB, HCT, PLT in the last 72 hours. BMET No results for input(s): NA, K, CL, CO2, GLUCOSE, BUN, CREATININE, CALCIUM in the last 72 hours.  Studies/Results: DG Lumbar Spine 2-3 Views  Result Date: 08/16/2020 CLINICAL DATA:  PLIF L2-L3 EXAM: LUMBAR SPINE - 2-3 VIEW; DG C-ARM 1-60 MIN COMPARISON:  MR lumbar spine 05/12/2020 FLUOROSCOPY TIME:  0 minutes 28 seconds Images: 4 FINDINGS: Prior MR demonstrates orthopedic hardware from prior posterior fusion of L3-S1. Intraoperative images demonstrate placement of additional surgical hardware at L2-L3 posteriorly as well as a disc prosthesis. IMPRESSION: Intraoperative images during posterior fusion L2-L3. Electronically Signed   By: Lavonia Dana M.D.   On: 08/16/2020 12:19   DG C-Arm 1-60 Min  Result Date: 08/16/2020 CLINICAL DATA:  PLIF L2-L3 EXAM: LUMBAR SPINE - 2-3 VIEW; DG C-ARM 1-60 MIN COMPARISON:  MR lumbar spine 05/12/2020 FLUOROSCOPY TIME:  0 minutes 28 seconds Images: 4 FINDINGS: Prior MR demonstrates orthopedic hardware from prior  posterior fusion of L3-S1. Intraoperative images demonstrate placement of additional surgical hardware at L2-L3 posteriorly as well as a disc prosthesis. IMPRESSION: Intraoperative images during posterior fusion L2-L3. Electronically Signed   By: Lavonia Dana M.D.   On: 08/16/2020 12:19    Assessment/Plan: Patient is post-op day 2 s/p left L2/3 TLIF. She is continuing to recover well.  Her only complaint is mild incisional discomfort.  She has worked with PT/OT. OT is recommending no follow up needed. PT is recommending home health PT. Continue LSO brace when OOB. Continue working on pain control, mobility and ambulating patient. Will plan for discharge today.   LOS: 2 days     Marvis Moeller, DNP, NP-C 08/18/2020, 7:30 AM

## 2020-08-18 NOTE — Progress Notes (Signed)
Patient was transported via wheelchair by NT for discharge home; in no acute distress nor complaints of pain nor discomfort; room checked for her belongings and were all taken along with her; discharge instructions given by RN and she verbalized understanding.

## 2020-08-18 NOTE — TOC Transition Note (Signed)
Transition of Care [facility]) - CM/SW Discharge Note   Patient Details  Name: [name] MRN: [id number] Date of Birth: 1945/12/15  Transition of Care [facility]) CM/SW Contact:  Joanne Chars, LCSW Phone Number: 08/18/2020, 8:40 AM   Clinical Narrative:   Pt discharging home with [facility].  DME from [facility] staff.  No other needs identified.     Final next level of care: [place] Barriers to Discharge: Barriers Resolved   Patient Goals and CMS Choice Patient states their goals for this hospitalization and ongoing recovery are:: "get back pain free" CMS Medicare.gov Compare Post Acute Care list provided to:: Patient Choice offered to / list presented to : Patient  Discharge Placement                       Discharge Plan and Services In-house Referral: Clinical Social Work   Post Acute Care Choice: Home Health          DME Arranged: N/A (DME from [facility] staff)         [place] Arranged: PT [place] Agency: [place] Date [place]: 08/18/20 Time [place]: [phone] Representative spoke with at [place]: [place] Determinants of Health ([place]) Interventions     Readmission Risk Interventions No flowsheet data found.

## 2020-08-18 NOTE — Progress Notes (Signed)
Physical Therapy Treatment Patient Details Name: [name] MRN: [id number] DOB: 1945/11/22 Today's Date: 08/18/2020    History of Present Illness The pt is a 75 yo female presenting 4/19 for L2-3 TLIF due to ongoing pain in low back, L hip, and L lateral thigh. PMH includes: anxiety, DM, CKD, HTN, OA, prior back surgery, R rotator cuff repair, R TKA.    PT Comments    Pt received in chair, cooperative and pleasant. Pt able to verbally state 3/3 low back precautions. Pt moving fairly well with RW. Steady with no overt LOB. Minimal cueing occasionally needed to maintain precautions. Did require min A for steadying on stairs but son-in-law will be present at home to assist pt with stair navigation. Education provided on car transfers, fall prevention, and home set up to help maintain precautions. Pt demonstrated understanding. Left in bed with all needs met and call bell within reach. Will continue to follow acutely.    Follow Up Recommendations  Home health PT     Equipment Recommendations  None recommended by PT    Recommendations for Other Services       Precautions / Restrictions Precautions Precautions: Back;Fall Precaution Booklet Issued: Yes (comment) Precaution Comments: discussed verbally, requires min cueing to recall and adhere functionally Required Braces or Orthoses: Spinal Brace Spinal Brace: Lumbar corset;Applied in sitting position Restrictions Weight Bearing Restrictions: No    Mobility  Bed Mobility Overal bed mobility: Needs Assistance Bed Mobility: Rolling;Sidelying to Sit;Sit to Sidelying Rolling: Supervision Sidelying to sit: Supervision     Sit to sidelying: Supervision General bed mobility comments: cueing to maintain precautions, no physical assist given, use of bed rail    Transfers Overall transfer level: Modified independent Equipment used: Rolling walker (2 wheeled) Transfers: Sit to/from Stand Sit to Stand: Modified independent  (Device/Increase time)         General transfer comment: good hand placement for sit to stand, cueing for hand placement during stand to sit, but no physical assist required  Ambulation/Gait Ambulation/Gait assistance: Modified independent (Device/Increase time) Gait Distance (Feet): 300 Feet Assistive device: Rolling walker (2 wheeled) Gait Pattern/deviations: Step-through pattern;Decreased stride length Gait velocity: decreased   General Gait Details: slowed gait, no physical assist or LOB, cues to prevent twisting   Stairs Stairs: Yes Stairs assistance: Min assist Stair Management: No rails;Forwards;Step to pattern Number of Stairs: 2 General stair comments: min A for steadying   Wheelchair Mobility    Modified Rankin (Stroke Patients Only)       Balance Overall balance assessment: Mild deficits observed, not formally tested                                          Cognition Arousal/Alertness: Awake/alert Behavior During Therapy: WFL for tasks assessed/performed Overall Cognitive Status: Within Functional Limits for tasks assessed                                        Exercises      General Comments        Pertinent Vitals/Pain Pain Assessment: 0-10 Pain Score: 3  Pain Location: back Pain Descriptors / Indicators: Operative site guarding Pain Intervention(s): Monitored during session;Repositioned    Home Living  Prior Function            PT Goals (current goals can now be found in the care plan section) Acute Rehab PT Goals Patient Stated Goal: return home PT Goal Formulation: With patient Time For Goal Achievement: 08/31/20 Potential to Achieve Goals: Good Progress towards PT goals: Progressing toward goals    Frequency    Min 5X/week      PT Plan Current plan remains appropriate    Co-evaluation              AM-PAC PT "6 Clicks" Mobility   Outcome Measure   Help needed turning from your back to your side while in a flat bed without using bedrails?: A Little Help needed moving from lying on your back to sitting on the side of a flat bed without using bedrails?: A Little Help needed moving to and from a bed to a chair (including a wheelchair)?: A Little Help needed standing up from a chair using your arms (e.g., wheelchair or bedside chair)?: None Help needed to walk in hospital room?: None Help needed climbing 3-5 steps with a railing? : A Little 6 Click Score: 20    End of Session Equipment Utilized During Treatment: Back brace Activity Tolerance: Patient tolerated treatment well;No increased pain Patient left: in bed;with call bell/phone within reach Nurse Communication: Mobility status PT Visit Diagnosis: Other abnormalities of gait and mobility (R26.89);Pain Pain - part of body:  (back)     Time:  -     Charges:              Rosita Kea, SPT

## 2020-08-18 NOTE — TOC Initial Note (Signed)
Transition of Care [facility]) - Initial/Assessment Note    Patient Details  Name: [name] MRN: [id number] Date of Birth: 11-27-1945  Transition of Care [facility]) CM/SW Contact:    Joanne Chars, LCSW Phone Number: 08/18/2020, 8:21 AM  Clinical Narrative: CSW met with pt regarding recommendation for [facility].  Pt agreeable, choice document given, no preference for agency stated.  Permission given to speak with daughter Sharyn Lull, friend Estill Bamberg.  PCP in place.  Pt is vaccinated and boosted for covid.                 Expected Discharge Plan: [place] Barriers to Discharge: Continued Medical Work up,Other (comment) ([place] with [place])   Patient Goals and CMS Choice Patient states their goals for this hospitalization and ongoing recovery are:: "get back pain free" CMS Medicare.gov Compare Post Acute Care list provided to:: Patient Choice offered to / list presented to : Patient  Expected Discharge Plan and Services Expected Discharge Plan: [place] In-house Referral: Clinical Social Work   Post Acute Care Choice: [place] arrangements for the past 2 months: [place] Expected Discharge Date: 08/18/20               DME Arranged: N/A (DME from [facility] staff)                    Prior Living Arrangements/Services Living arrangements for the past 2 months: Single Family Home Lives with:: Self Patient language and need for interpreter reviewed:: Yes Do you feel safe going back to the place where you live?: Yes      Need for Family Participation in Patient Care: No (Comment) Care giver support system in place?: Yes (comment)   Criminal Activity/Legal Involvement Pertinent to Current Situation/Hospitalization: No - Comment as needed  Activities of Daily Living      Permission Sought/Granted Permission sought to share information with : Family Supports Permission granted to share information with : Yes, Verbal  Permission Granted  Share Information with NAME: daughter Sharyn Lull, friend Estill Bamberg           Emotional Assessment Appearance:: Appears stated age Attitude/Demeanor/Rapport: Engaged Affect (typically observed): Appropriate,Pleasant Orientation: : Oriented to Self,Oriented to Place,Oriented to  Time,Oriented to Situation Alcohol / Substance Use: Not Applicable Psych Involvement: No (comment)  Admission diagnosis:  Degenerative lumbar spinal stenosis [B35.329] Patient Active Problem List   Diagnosis Date Noted  . Degenerative lumbar spinal stenosis 08/16/2020  . Acute blood loss as cause of postoperative anemia 04/03/2016  . Class 3 obesity due to excess calories with body mass index (BMI) of 40.0 to 44.9 in adult   . Primary localized osteoarthritis of right knee   . CKD (chronic kidney disease), stage III ([place]) 10/22/2015  . Syncope 10/21/2015  . Non-insulin dependent type 2 diabetes mellitus ([place]) 10/21/2015  . Hypothyroidism 10/21/2015  . 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 Right rotator cuff tear   . Anxiety   . PONV (postoperative nausea and vomiting)   . GERD (gastroesophageal reflux disease)   . Arthritis   . Thyroid disease   . DIVERTICULOSIS-COLON 07/04/2009  . Iron deficiency anemia 03/17/2009  . PERSONAL HX COLONIC POLYPS 03/17/2009  . SKIN RASH 02/23/2009  . DIARRHEA 02/23/2009  . FEVER, HX OF 02/23/2009  . THYROID NODULE, LEFT 02/11/2009  . Glaucoma 02/11/2009  . Coronary atherosclerosis 02/11/2009  . 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 DEGENERATIVE JOINT DISEASE,  ANKLE 02/11/2009  . Osteoarthritis of spine 02/11/2009  . Essential hypertension 02/09/2009   PCP:  Leanna Battles, MD Pharmacy:   [facility] [place], [place] [address] Phone: [phone] Fax: [phone]     Social Determinants of  Health (SDOH) Interventions    Readmission Risk Interventions No flowsheet data found.

## 2021-01-07 ENCOUNTER — Other Ambulatory Visit: Payer: Self-pay | Admitting: Gastroenterology

## 2021-01-26 ENCOUNTER — Encounter: Payer: Self-pay | Admitting: Nurse Practitioner

## 2021-01-26 ENCOUNTER — Telehealth: Payer: Self-pay | Admitting: Nurse Practitioner

## 2021-01-26 ENCOUNTER — Ambulatory Visit: Payer: Medicare Other | Admitting: Nurse Practitioner

## 2021-01-26 VITALS — BP 118/68 | HR 74 | Ht 63.0 in | Wt 242.0 lb

## 2021-01-26 DIAGNOSIS — K580 Irritable bowel syndrome with diarrhea: Secondary | ICD-10-CM | POA: Diagnosis not present

## 2021-01-26 MED ORDER — DICYCLOMINE HCL 10 MG PO CAPS: 10.0000 mg | ORAL_CAPSULE | Freq: Three times a day (TID) | ORAL | 5 refills | Status: DC

## 2021-01-26 NOTE — Patient Instructions (Addendum)
If you are age 74 or older, your body mass index should be between 23-30. Your Body mass index is 42.87 kg/m. If this is out of the aforementioned range listed, please consider follow up with your Primary Care Provider.  The [place] [facility] providers would like to encourage you to use [facility] to communicate with providers for non-urgent requests or questions.  Due to long hold times on the telephone, sending your provider a message by [facility] may be faster and more efficient way to get a response. Please allow 48 business hours for a response.  Please remember that this is for non-urgent requests/questions.  MEDICATION: We have sent the following medication to your pharmacy for you to pick up at your convenience: Dicyclomine 10 MG tablet, take 1 tablet three times a day as needed.  It was great seeing you today! Thank you for entrusting me with your care and choosing [facility].  Noralyn Pick, CRNP

## 2021-01-26 NOTE — Telephone Encounter (Signed)
Inbound call from pt stating that she had an appt today with Jaclyn Shaggy and she wanted her to know that Dr. Lynne Leader name isn't on the bottle of Flexeril. It's Dr. Sable Feil. Pt stated she didn't know who the Dr was.

## 2021-01-26 NOTE — Progress Notes (Signed)
01/26/2021 [name] [id number] 04/08/1946   Chief Complaint: Refill Dicyclomine   History of Present Illness: [name] is a 75 year old female with a past medical history of anxiety, WPW, diabetes mellitus type 2, CKD stage III, colon polyps and IBS.  She is followed by Dr. Fuller Plan.  She has a history of IBS-D which was well controlled by taking dicyclomine 10 mg 3 times daily.  She refilled her Dicyclomine prescription 2 months ago at the [facility].  A few days later, she developed urgent nonbloody diarrhea which occurred shortly after eating any food which was similar to the symptoms she had with her past IBS D.  She continued to have daily diarrhea x 8 weeks and her friend recently  looked at her prescription and saw that she received Cyclobenzaprine (Flexaril) and not Dicyclomine which was a pharmacy error and not a prescriber error.  The patient reported taking what she thought was Dicyclomine but was actually Cyclobenzaprine 10 mg 3 times daily for the past 2 months.  She contacted her pharmacy and notified the pharmacist of of this error.  She presents to our office today for her annual review and for a Dicyclomine prescription.  She denies having any abdominal pain.  Her food intake has decreased, no significant weight loss.  No antibiotics within the past 3 to 4 months.  Her most recent colonoscopy was 12/15/2019 which identified 1 tubular adenomatous polyp which was removed from the ascending colon and random colon biopsies were negative for microscopic colitis or IBD.  No other complaints at this time.  Past Medical History:  Diagnosis Date   Acute blood loss as cause of postoperative anemia 04/03/2016   Anemia    hx of   Anxiety    takes Xanax daily as needed   Arthritis    Bronchitis    a month ago   Chronic back pain    stenosis   Chronic kidney disease    stage 3-sees Dr.J Patel   Class 3 obesity due to excess calories with body mass index (BMI) of 40.0  to 44.9 in adult    Diabetes mellitus    type II; takes Byetta and Invokana daily   Glaucoma    uses Eye Drops   Gout    takes Uloric daily   History of blood transfusion    no abnormal reaction    History of colon polyps    Hyperlipidemia    no meds taken now   Hypertension    takes HCTZ daily as well as Aldactone   Hypothyroidism    takes Synthroid daily   Pneumonia    many yrs ago   PONV (postoperative nausea and vomiting)    Primary localized osteoarthritis of right knee    Stress incontinence    Syncope    Weakness    numbness and tingling in right hip/eg/   Wears glasses    Wolf-Parkinson-White syndrome    takes Atenolol daily   Past Surgical History:  Procedure Laterality Date   ABDOMINAL HYSTERECTOMY     APPENDECTOMY     BACK SURGERY  2006   lumb fusion   BACK SURGERY  08/16/2014   L3-4 fusion   BREAST BIOPSY     BREAST ENHANCEMENT SURGERY Bilateral    BREAST EXCISIONAL BIOPSY Left    benign   CARDIAC CATHETERIZATION  >48yrs ago   CARPAL TUNNEL RELEASE Bilateral    x4   cataract surgery Bilateral  CESAREAN SECTION     x 3   CHOLECYSTECTOMY  1981   COLONOSCOPY     KNEE ARTHROSCOPY Right 2009   rt   RADIOLOGY WITH ANESTHESIA N/A 05/12/2020   Procedure: MRI LUMBAR SPINE WITH AND WITHOUT CONTRAST;  Surgeon: Radiologist, Medication, MD;  Location: [place];  Service: Radiology;  Laterality: N/A;   RAZ procedure  10+yrs ago   REDUCTION MAMMAPLASTY Bilateral 2006   ROTATOR CUFF REPAIR Right    x 4   SHOULDER ARTHROSCOPY WITH ROTATOR CUFF REPAIR AND SUBACROMIAL DECOMPRESSION Right 06/10/2012   Procedure: SHOULDER ARTHROSCOPY WITH ROTATOR CUFF REPAIR AND SUBACROMIAL DECOMPRESSION;  Surgeon: Lorn Junes, MD;  Location: [place];  Service: Orthopedics;  Laterality: Right;  RIGHT SHOULDER ARTHROSCOPY, DECOMPRESSION SUBACROMIAL PARTIAL ACROMIOPLASTY WITH CORACOACROMIAL RELEASE,, DISTAL CLAVICULECTOMY , ROTATOR CUFF debriedment   SKIN BIOPSY      removed moles all over body   THYROID SURGERY Right    right thyroid removed   TOTAL KNEE ARTHROPLASTY Right 04/02/2016   Procedure: RIGHT TOTAL KNEE ARTHROPLASTY;  Surgeon: Elsie Saas, MD;  Location: [place];  Service: Orthopedics;  Laterality: Right;   TRANSFORAMINAL LUMBAR INTERBODY FUSION (TLIF) WITH PEDICLE SCREW FIXATION 1 LEVEL Left 08/16/2020   Procedure: Left Lumbar two-three Transforaminal lumbar interbody fusion with exploration and extension of adjacent level fusion;  Surgeon: Erline Levine, MD;  Location: [place];  Service: Neurosurgery;  Laterality: Left;    CBC Latest Ref Rng & Units 08/12/2020 05/12/2020 04/05/2016  WBC 4.0 - 10.5 K/uL 5.1 5.6 9.0  Hemoglobin 12.0 - 15.0 g/dL 11.2(L) 11.3(L) 9.9(L)  Hematocrit 36.0 - 46.0 % 36.2 37.7 30.1(L)  Platelets 150 - 400 K/uL 275 229 183  MCV 90.3  CMP Latest Ref Rng & Units 08/12/2020 05/12/2020 05/15/2019  Glucose 70 - 99 mg/dL 100(H) 115(H) 121(H)  BUN 8 - 23 mg/dL 21 33(H) 25  Creatinine 0.44 - 1.00 mg/dL 1.34(H) 1.29(H) 1.39(H)  Sodium 135 - 145 mmol/L 139 139 139  Potassium 3.5 - 5.1 mmol/L 4.4 4.2 4.3  Chloride 98 - 111 mmol/L 105 104 103  CO2 22 - 32 mmol/L 26 23 22   Calcium 8.9 - 10.3 mg/dL 9.6 9.7 9.8  Total Protein 6.5 - 8.1 g/dL - - -  Total Bilirubin 0.3 - 1.2 mg/dL - - -  Alkaline Phos 38 - 126 U/L - - -  AST 15 - 41 U/L - - -  ALT 14 - 54 U/L - - -    Colonoscopy 12/15/2019: - Ileal diverticula, otherwise normal appearing. - One 7 mm polyp in the ascending colon, removed with a cold snare. Resected and retrieved. - Mild diverticulosis in the right colon. - Moderate diverticulosis in the left colon. - The exam was otherwise without abnormality on direct and retroflexion views. Random biopsies obtained 1. Surgical [P], colon, random sites - COLONIC MUCOSA WITH NO SIGNIFICANT HISTOPATHOLOGIC CHANGES. - NO MICROSCOPIC COLITIS, ACTIVE INFLAMMATION OR CHRONIC CHANGES. 2. Surgical [P], colon, ascending, polyp - TUBULAR  ADENOMA(S). - NO HIGH GRADE DYSPLASIA OR CARCINOMA  Current Outpatient Medications on File Prior to Visit  Medication Sig Dispense Refill   ALPRAZolam (XANAX) 0.5 MG tablet Take 0.5 mg by mouth 3 (three) times daily as needed for anxiety.      aspirin 81 MG chewable tablet Chew 81 mg by mouth daily.     atenolol (TENORMIN) 25 MG tablet Take 25 mg by mouth daily.     febuxostat (ULORIC) 40 MG tablet Take 40 mg  by mouth daily.      HYDROmorphone (DILAUDID) 2 MG tablet Take 0.5-1 tablets (1-2 mg total) by mouth every 4 (four) hours as needed for severe pain. 30 tablet 0   latanoprost (XALATAN) 0.005 % ophthalmic solution Place 1 drop into both eyes at bedtime.     levothyroxine (SYNTHROID, LEVOTHROID) 50 MCG tablet Take 50-100 mcg by mouth See admin instructions. Take 50 mcg daily, except 100 mcg by mouth on Sunday     methocarbamol (ROBAXIN) 500 MG tablet Take 1 tablet (500 mg total) by mouth every 6 (six) hours as needed for muscle spasms. 90 tablet 0   Multiple Vitamins-Minerals (MULTIVITAMIN WITH MINERALS) tablet Take 1 tablet by mouth daily.     pravastatin (PRAVACHOL) 20 MG tablet Take 20 mg by mouth every evening.     telmisartan (MICARDIS) 40 MG tablet Take 40 mg by mouth daily.     TRULICITY 8.29 FA/2.1HY SOPN Inject 0.75 mg into the skin every Sunday.  6   Current Facility-Administered Medications on File Prior to Visit  Medication Dose Route Frequency Provider Last Rate Last Admin   0.9 %  sodium chloride infusion  500 mL Intravenous Once Ladene Artist, MD       Allergies  Allergen Reactions   Contrast Media [Iodinated Diagnostic Agents] Shortness Of Breath and Other (See Comments)    bp elevated   Adhesive [Tape] Other (See Comments)    Pulls skin off   Esomeprazole Magnesium Hives   Morphine And Related Other (See Comments)    Severe headache  chills   Talwin [Pentazocine] Other (See Comments)    hallucinations   Acetaminophen     Other reaction(s): Stomach  pains,vomiting   Allopurinol Diarrhea   Codeine Nausea And Vomiting   Colchicine Diarrhea   Hydrocodone Nausea And Vomiting   Meperidine Hcl Nausea And Vomiting   Oxycodone Nausea And Vomiting   Pentazocine Lactate Nausea And Vomiting   Current Medications, Allergies, Past Medical History, Past Surgical History, Family History and Social History were reviewed in [employer] record.  Review of Systems:   Constitutional: Negative for fever, sweats, chills or weight loss.  Respiratory: Negative for shortness of breath.   Cardiovascular: Negative for chest pain, palpitations and leg swelling.  Gastrointestinal: See HPI.  Musculoskeletal: Negative for back pain or muscle aches.  Neurological: Negative for dizziness, headaches or paresthesias.   Physical Exam: BP 118/68   Pulse 74   Ht 5\' 3"  (1.6 m)   Wt 242 lb (109.8 kg)   LMP  (LMP Unknown)   BMI 42.87 kg/m  General: 75 year old female in no acute distress. Head: Normocephalic and atraumatic. Eyes: No scleral icterus. Conjunctiva pink . Ears: Normal auditory acuity. Mouth: Dentition intact. No ulcers or lesions.  Lungs: Clear throughout to auscultation. Heart: Regular rate and rhythm, no murmur. Abdomen: Soft, nontender and nondistended. No masses or hepatomegaly. Normal bowel sounds x 4 quadrants. Large RUQ scar intact.  Rectal: Deferred.  Musculoskeletal: Symmetrical with no gross deformities. Extremities: No edema. Neurological: Alert oriented x 4. No focal deficits.  Psychological: Alert and cooperative. Normal mood and affect  Assessment and Recommendations:  71) 75 year old female with IBS-D is typically well controlled on Dicyclomine 10 mg 3 times daily.  She had a flare of significant IBS-D after she erroneously received Cyclobenzaprine instead of Dicyclomine by her pharmacy as noted above.  -Dicyclomine 10 mg 1 p.o. 3 times daily. She does not wish to switch pharmacies at this  time, therefore her  prescription will go to the [place] -She will call our office if she has any further errors with this prescription -She will call our office if she develops any dizziness or light headedness on Dicyclomine -Will call our office if her diarrhea persists or worsens  2) History of a tubular adenomatous polyp per colonoscopy 11/2019.  No family history of colon cancer. -Dr. Fuller Plan to verify if any further colonoscopies for colon polyp surveillance required or defer due to age  7) Mild chronic normocytic anemia, likely due to CKD.   4) CKD. Cr 1.34

## 2021-01-26 NOTE — Progress Notes (Signed)
Reviewed and agree with management plan.  Jadan Hinojos T. Madiline Saffran, MD FACG 

## 2021-01-27 NOTE — Telephone Encounter (Signed)
Noted, refer to office visit 01/26/2021.

## 2021-02-02 ENCOUNTER — Encounter (HOSPITAL_BASED_OUTPATIENT_CLINIC_OR_DEPARTMENT_OTHER): Payer: Self-pay | Admitting: Physical Therapy

## 2021-02-02 ENCOUNTER — Ambulatory Visit (HOSPITAL_BASED_OUTPATIENT_CLINIC_OR_DEPARTMENT_OTHER): Payer: Medicare Other | Attending: Internal Medicine | Admitting: Physical Therapy

## 2021-02-02 ENCOUNTER — Other Ambulatory Visit: Payer: Self-pay

## 2021-02-02 DIAGNOSIS — M6281 Muscle weakness (generalized): Secondary | ICD-10-CM | POA: Diagnosis not present

## 2021-02-02 DIAGNOSIS — R262 Difficulty in walking, not elsewhere classified: Secondary | ICD-10-CM | POA: Insufficient documentation

## 2021-02-02 DIAGNOSIS — M25561 Pain in right knee: Secondary | ICD-10-CM | POA: Insufficient documentation

## 2021-02-02 DIAGNOSIS — Z981 Arthrodesis status: Secondary | ICD-10-CM | POA: Insufficient documentation

## 2021-02-02 DIAGNOSIS — M545 Low back pain, unspecified: Secondary | ICD-10-CM | POA: Diagnosis present

## 2021-02-02 DIAGNOSIS — Z09 Encounter for follow-up examination after completed treatment for conditions other than malignant neoplasm: Secondary | ICD-10-CM | POA: Insufficient documentation

## 2021-02-02 NOTE — Therapy (Signed)
OUTPATIENT PHYSICAL THERAPY THORACOLUMBAR EVALUATION   Patient Name: [name] MRN: [id number] DOB:[birth year], 75 y.o., female Today's Date: 02/02/2021   PT End of Session - 02/02/21 1528     Visit Number 1    Number of Visits 19    Date for PT Re-Evaluation 05/03/21    Authorization Type [employer] Medicare    PT Start Time 5621   pt arrives late   PT Stop Time 1600    PT Time Calculation (min) 35 min    Activity Tolerance Patient tolerated treatment well;Patient limited by pain;Patient limited by fatigue    Behavior During Therapy [facility] for tasks assessed/performed             Past Medical History:  Diagnosis Date   Acute blood loss as cause of postoperative anemia 04/03/2016   Anemia    hx of   Anxiety    takes Xanax daily as needed   Arthritis    Bronchitis    a month ago   Chronic back pain    stenosis   Chronic kidney disease    stage 3-sees Dr.J Patel   Class 3 obesity due to excess calories with body mass index (BMI) of 40.0 to 44.9 in adult    Diabetes mellitus    type II; takes Byetta and Invokana daily   Glaucoma    uses Eye Drops   Gout    takes Uloric daily   History of blood transfusion    no abnormal reaction    History of colon polyps    Hyperlipidemia    no meds taken now   Hypertension    takes HCTZ daily as well as Aldactone   Hypothyroidism    takes Synthroid daily   Pneumonia    many yrs ago   PONV (postoperative nausea and vomiting)    Primary localized osteoarthritis of right knee    Stress incontinence    Syncope    Weakness    numbness and tingling in right hip/eg/   Wears glasses    Wolf-Parkinson-White syndrome    takes Atenolol daily   Past Surgical History:  Procedure Laterality Date   ABDOMINAL HYSTERECTOMY     APPENDECTOMY     BACK SURGERY  2006   lumb fusion   BACK SURGERY  08/16/2014   L3-4 fusion   BREAST BIOPSY     BREAST ENHANCEMENT SURGERY Bilateral    BREAST EXCISIONAL BIOPSY Left    benign   CARDIAC  CATHETERIZATION  >29yrs ago   CARPAL TUNNEL RELEASE Bilateral    x4   cataract surgery Bilateral    CESAREAN SECTION     x 3   CHOLECYSTECTOMY  1981   COLONOSCOPY     KNEE ARTHROSCOPY Right 2009   rt   RADIOLOGY WITH ANESTHESIA N/A 05/12/2020   Procedure: MRI LUMBAR SPINE WITH AND WITHOUT CONTRAST;  Surgeon: Radiologist, Medication, MD;  Location: [place];  Service: Radiology;  Laterality: N/A;   RAZ procedure  10+yrs ago   REDUCTION MAMMAPLASTY Bilateral 2006   ROTATOR CUFF REPAIR Right    x 4   SHOULDER ARTHROSCOPY WITH ROTATOR CUFF REPAIR AND SUBACROMIAL DECOMPRESSION Right 06/10/2012   Procedure: SHOULDER ARTHROSCOPY WITH ROTATOR CUFF REPAIR AND SUBACROMIAL DECOMPRESSION;  Surgeon: Lorn Junes, MD;  Location: [place];  Service: Orthopedics;  Laterality: Right;  RIGHT SHOULDER ARTHROSCOPY, DECOMPRESSION SUBACROMIAL PARTIAL ACROMIOPLASTY WITH CORACOACROMIAL RELEASE,, DISTAL CLAVICULECTOMY , ROTATOR CUFF debriedment   SKIN BIOPSY  removed moles all over body   THYROID SURGERY Right    right thyroid removed   TOTAL KNEE ARTHROPLASTY Right 04/02/2016   Procedure: RIGHT TOTAL KNEE ARTHROPLASTY;  Surgeon: Elsie Saas, MD;  Location: [place];  Service: Orthopedics;  Laterality: Right;   TRANSFORAMINAL LUMBAR INTERBODY FUSION (TLIF) WITH PEDICLE SCREW FIXATION 1 LEVEL Left 08/16/2020   Procedure: Left Lumbar two-three Transforaminal lumbar interbody fusion with exploration and extension of adjacent level fusion;  Surgeon: Erline Levine, MD;  Location: [place];  Service: Neurosurgery;  Laterality: Left;   Patient Active Problem List   Diagnosis Date Noted   Degenerative lumbar spinal stenosis 08/16/2020   Acute blood loss as cause of postoperative anemia 04/03/2016   Class 3 obesity due to excess calories with body mass index (BMI) of 40.0 to 44.9 in adult    Primary localized osteoarthritis of right knee    CKD (chronic kidney disease), stage III ([place]) 10/22/2015    Syncope 10/21/2015   Non-insulin dependent type 2 diabetes mellitus ([place]) 10/21/2015   Hypothyroidism 10/21/2015   Lumbar stenosis with neurogenic claudication 08/16/2014   Right rotator cuff tear    Anxiety    PONV (postoperative nausea and vomiting)    GERD (gastroesophageal reflux disease)    Arthritis    Thyroid disease    DIVERTICULOSIS-COLON 07/04/2009   Iron deficiency anemia 03/17/2009   PERSONAL HX COLONIC POLYPS 03/17/2009   SKIN RASH 02/23/2009   DIARRHEA 02/23/2009   FEVER, HX OF 02/23/2009   THYROID NODULE, LEFT 02/11/2009   Glaucoma 02/11/2009   Coronary atherosclerosis 02/11/2009   WOLFF (WOLFE)-PARKINSON-WHITE (WPW) SYNDROME 02/11/2009   DEGENERATIVE JOINT DISEASE, ANKLE 02/11/2009   Osteoarthritis of spine 02/11/2009   Essential hypertension 02/09/2009    PCP: Leanna Battles, MD  REFERRING PROVIDER: Leanna Battles, MD  REFERRING DIAG: Rehab postoperative lumbar fusion with instrumentation Unstable knee and ankle Knee replacement inside and back weakness   THERAPY DIAG:  Pain, lumbar region  Pain in joint of right knee  Muscle weakness (generalized)  Difficulty walking  ONSET DATE: April 2022  SUBJECTIVE:                                                                                                                                                                                           SUBJECTIVE STATEMENT: Pt states she has difficulty walking and is very weak from the surgery. She has had 3 lumbar fusions. Pt states she has 10 screws and 6 rods into her back. She currently walks in the water but does not have have another exercise program. Pt states she has difficulty with postural stability. "I am  just stiff." Pt states that just walking, she is stiff and painful for a few days. Pt states she is only able to from the parking lot to the building before taking a rest. Pt states MD cannot lift over 4lbs. She states she cannot do house work due to the  pain and weakness. Pt denies NT. Pt states she has had 2 moments of legs feeling weak since surgery. Diffiuclty getting out of tub at home.    Pt locates the pain to the L side above the hip. She states it is in the same place she has had multiple surgeries. Pt denies cancer red flags. Pt denies BB incontinence.   PERTINENT HISTORY:  R TKA 2018,   PAIN:  Are you having pain? No Worst 9/10 Aggravating factors: poor rest, excessive extension,  Relieving factors: muscle relaxant, ice/heat, leaning over, sitting on shopping cart  PRECAUTIONS: None  WEIGHT BEARING RESTRICTIONS No  FALLS:  Has patient fallen in last 6 months? Yes, Number of falls: 1, legs gave way  LIVING ENVIRONMENT: Lives with: lives alone Lives in: House/apartment Stairs: Yes; 2 steps to enter. Level home  Has following equipment at home: Single point cane  OCCUPATION: retired  PLOF: Independent  PATIENT GOALS : Pt states she would like to improve posture and walk longer distances.    OBJECTIVE:   DIAGNOSTIC FINDINGS:  L2-L3: Increased disc bulge and facet arthropathy with ligamentum flavum infolding. Probable superimposed left subarticular protrusion. Moderate canal stenosis with effacement of left greater than right subarticular recesses. Increased moderate foraminal stenosis.   L3-L4: Operative level with interval decompression. Expected epidural enhancement. No stenosis.   L4-L5: Operative level with interval decompression. Expected epidural enhancement. No stenosis.   L5-S1: Operative level. Minimal disc bulge. Facet arthropathy. No significant canal or foraminal stenosis.   IMPRESSION: Degenerative and postoperative changes as detailed above. Progression of canal, subarticular recess, and foraminal stenosis at L2-L3 with potential left nerve root compression  PATIENT SURVEYS:  Modified Oswestry 24 / 50 or 48 %    COGNITION:  Overall cognitive status: Within functional limits for tasks  assessed     SENSATION:  Light touch: Appears intact     POSTURE:  Decreased lumbar lordosis, kyphotic with rounded shoulders, swayback position  LUMBARAROM/PROM  A/PROM A/PROM  02/02/2021  Flexion WFL given history of fusion  Extension WFL   Right lateral flexion WFL   Left lateral flexion WFL   Right rotation WFL   Left rotation WFL    (Blank rows = not tested)  LE AROM/PROM:   Bilateral hip stiffness and limitation into flexion, ER, and IR with functional movement screen Unable to cross and uncross legs in sitting position Limited bilat knee flexion with squatting and attempted stairs  LE MMT:  MMT Right 02/02/2021 Left 02/02/2021  Hip flexion 4-/5 4-/5  Hip extension 4/5 4/5  Hip abduction 4-/5 4-/5  Hip adduction 4-/5 4-/5  Hip internal rotation 4-/5 4-/5  Hip external rotation 4-/5 4-/5                           (Blank rows = not tested)   Palpation ASSESSMENT:  TTP and hypertonicity of bilat lumbar paraspinals; bilat QL hypertonicity  FUNCTIONAL TESTS:  5 times sit to stand: Unable to perform without UE TUG: >20s  GAIT: Distance walked: 45 Assistive device utilized: None Level of assistance: SBA Comments: antalgic, shuffling gait Stairs: bilat UE support, step to pattern, lurching ascent,  uncontrolled descent    TODAY'S TREATMENT   Exercises Hooklying Single Knee to Chest Stretch with Towel - 2 x daily - 7 x weekly - 1 sets - 10 reps - 5 hold Neutral Lumbar Spine Curl Up - 2 x daily - 7 x weekly - 1 sets - 10 reps - 2 hold    PATIENT EDUCATION:  Education details: MOI, diagnosis, prognosis, anatomy, exercise progression, DOMS expectations, muscle firing,  envelope of function, HEP, POC  Person educated: Patient Education method: Explanation, Demonstration, Tactile cues, Verbal cues, and Handouts Education comprehension: verbalized understanding, returned demonstration, verbal cues required, and tactile cues required   HOME EXERCISE  PROGRAM: Access Code: [email or web address] URL: [email or web address]  ASSESSMENT:  CLINICAL IMPRESSION: Patient is a 75 y.o. female who was seen today for physical therapy evaluation and treatment for cc of LBP and LE weakness. Objective impairments include Abnormal gait, decreased activity tolerance, decreased balance, decreased endurance, decreased mobility, difficulty walking, decreased ROM, decreased strength, decreased safety awareness, hypomobility, increased fascial restrictions, increased muscle spasms, impaired flexibility, improper body mechanics, postural dysfunction, obesity, and pain. These impairments are limiting patient from cleaning, community activity, driving, meal prep, laundry, yard work, and shopping. Personal factors including Age, Behavior pattern, Fitness, Past/current experiences, Time since onset of injury/illness/exacerbation, and 2+ comorbidities are also affecting patient's functional outcome. Patient will benefit from skilled PT to address above impairments and improve overall function.  REHAB POTENTIAL: Fair    CLINICAL DECISION MAKING: Evolving/moderate complexity  EVALUATION COMPLEXITY: Moderate   GOALS:  SHORT TERM GOALS:  STG Name Target Date Goal status  1 Pt will become independent with HEP in order to demonstrate synthesis of PT education.  :  02/16/2021 INITIAL  2 Pt will demonstrate at least a 12.8 improvement in Oswestry Index in order to demonstrate a clinically significant change in LBP and function.  :  03/02/2021 INITIAL  3 Pt will be able to demonstrate/report ability to sit/stand/sleep for extended periods of time without pain in order to demonstrate functional improvement and tolerance to static positioning.  : 03/02/2021 INITIAL  4 Pt will be able to demonstrate/report ability to walk >5 mins without pain in order to demonstrate functional improvement and tolerance to exercise and community mobility.  : 03/02/2021 INITIAL   LONG TERM  GOALS:   LTG Name Target Date Goal status  1 Pt  will become independent with final HEP in order to demonstrate synthesis of PT education.  : 03/30/2021 INITIAL  2 Pt will be able to perform 5XSTS in under 12s  in order to demonstrate functional improvement above the cut off score for adults.  : 03/30/2021 INITIAL  3 Pt will demonstrate at least a 25 improvement in Oswestry Index in order to demonstrate a clinically significant change in LBP and function.  : 03/30/2021 INITIAL  4 Pt will be able to demonstrate TUG in under 10 sec in order to demonstrate functional improvement in LE function, strength, balance, and mobility for safety with community ambulation.  03/30/2021 INITIAL   PLAN: PT FREQUENCY: 2x/week  PT DURATION: 8 weeks  PLANNED INTERVENTIONS: Therapeutic exercises, Therapeutic activity, Neuro Muscular re-education, Balance training, Gait training, Patient/Family education, Joint mobilization, Stair training, Aquatic Therapy, Dry Needling, Electrical stimulation, Spinal mobilization, Cryotherapy, Moist heat, scar mobilization, Taping, Vasopneumatic device, Traction, Ultrasound, Ionotophoresis 4mg /ml Dexamethasone, and Manual therapy  PLAN FOR NEXT SESSION: aquatic introduction, gentle stretching, intro strength exercise, postural strength  Daleen Bo PT, DPT 02/02/21 8:27 PM

## 2021-02-06 ENCOUNTER — Ambulatory Visit (HOSPITAL_BASED_OUTPATIENT_CLINIC_OR_DEPARTMENT_OTHER): Payer: Medicare Other | Admitting: Physical Therapy

## 2021-02-06 ENCOUNTER — Encounter (HOSPITAL_BASED_OUTPATIENT_CLINIC_OR_DEPARTMENT_OTHER): Payer: Self-pay | Admitting: Physical Therapy

## 2021-02-06 ENCOUNTER — Other Ambulatory Visit: Payer: Self-pay

## 2021-02-06 DIAGNOSIS — R262 Difficulty in walking, not elsewhere classified: Secondary | ICD-10-CM

## 2021-02-06 DIAGNOSIS — M545 Low back pain, unspecified: Secondary | ICD-10-CM

## 2021-02-06 DIAGNOSIS — M6281 Muscle weakness (generalized): Secondary | ICD-10-CM

## 2021-02-06 DIAGNOSIS — M25561 Pain in right knee: Secondary | ICD-10-CM

## 2021-02-06 DIAGNOSIS — Z09 Encounter for follow-up examination after completed treatment for conditions other than malignant neoplasm: Secondary | ICD-10-CM | POA: Diagnosis not present

## 2021-02-06 NOTE — Therapy (Signed)
OUTPATIENT PHYSICAL THERAPY TREATMENT NOTE   Patient Name: [name] MRN: [id number] DOB:November 25, 1945, 75 y.o., female Today's Date: 02/06/2021  PCP: Leanna Battles, MD REFERRING PROVIDER: Leanna Battles, MD   PT End of Session - 02/06/21 1059     Visit Number 2    Number of Visits 19    Date for PT Re-Evaluation 05/03/21    Authorization Type [employer]HC Medicare    PT Start Time 1059    PT Stop Time 1140    PT Time Calculation (min) 41 min    Activity Tolerance Patient tolerated treatment well;Patient limited by pain;Patient limited by fatigue    Behavior During Therapy [facility] for tasks assessed/performed             Past Medical History:  Diagnosis Date   Acute blood loss as cause of postoperative anemia 04/03/2016   Anemia    hx of   Anxiety    takes Xanax daily as needed   Arthritis    Bronchitis    a month ago   Chronic back pain    stenosis   Chronic kidney disease    stage 3-sees Dr.J Patel   Class 3 obesity due to excess calories with body mass index (BMI) of 40.0 to 44.9 in adult    Diabetes mellitus    type II; takes Byetta and Invokana daily   Glaucoma    uses Eye Drops   Gout    takes Uloric daily   History of blood transfusion    no abnormal reaction    History of colon polyps    Hyperlipidemia    no meds taken now   Hypertension    takes HCTZ daily as well as Aldactone   Hypothyroidism    takes Synthroid daily   Pneumonia    many yrs ago   PONV (postoperative nausea and vomiting)    Primary localized osteoarthritis of right knee    Stress incontinence    Syncope    Weakness    numbness and tingling in right hip/eg/   Wears glasses    Wolf-Parkinson-White syndrome    takes Atenolol daily   Past Surgical History:  Procedure Laterality Date   ABDOMINAL HYSTERECTOMY     APPENDECTOMY     BACK SURGERY  2006   lumb fusion   BACK SURGERY  08/16/2014   L3-4 fusion   BREAST BIOPSY     BREAST ENHANCEMENT SURGERY Bilateral    BREAST  EXCISIONAL BIOPSY Left    benign   CARDIAC CATHETERIZATION  >45yrs ago   CARPAL TUNNEL RELEASE Bilateral    x4   cataract surgery Bilateral    CESAREAN SECTION     x 3   CHOLECYSTECTOMY  1981   COLONOSCOPY     KNEE ARTHROSCOPY Right 2009   rt   RADIOLOGY WITH ANESTHESIA N/A 05/12/2020   Procedure: MRI LUMBAR SPINE WITH AND WITHOUT CONTRAST;  Surgeon: Radiologist, Medication, MD;  Location: [place];  Service: Radiology;  Laterality: N/A;   RAZ procedure  10+yrs ago   REDUCTION MAMMAPLASTY Bilateral 2006   ROTATOR CUFF REPAIR Right    x 4   SHOULDER ARTHROSCOPY WITH ROTATOR CUFF REPAIR AND SUBACROMIAL DECOMPRESSION Right 06/10/2012   Procedure: SHOULDER ARTHROSCOPY WITH ROTATOR CUFF REPAIR AND SUBACROMIAL DECOMPRESSION;  Surgeon: Lorn Junes, MD;  Location: [place];  Service: Orthopedics;  Laterality: Right;  RIGHT SHOULDER ARTHROSCOPY, DECOMPRESSION SUBACROMIAL PARTIAL ACROMIOPLASTY WITH CORACOACROMIAL RELEASE,, DISTAL CLAVICULECTOMY , ROTATOR CUFF debriedment  SKIN BIOPSY     removed moles all over body   THYROID SURGERY Right    right thyroid removed   TOTAL KNEE ARTHROPLASTY Right 04/02/2016   Procedure: RIGHT TOTAL KNEE ARTHROPLASTY;  Surgeon: Elsie Saas, MD;  Location: [place];  Service: Orthopedics;  Laterality: Right;   TRANSFORAMINAL LUMBAR INTERBODY FUSION (TLIF) WITH PEDICLE SCREW FIXATION 1 LEVEL Left 08/16/2020   Procedure: Left Lumbar two-three Transforaminal lumbar interbody fusion with exploration and extension of adjacent level fusion;  Surgeon: Erline Levine, MD;  Location: [place];  Service: Neurosurgery;  Laterality: Left;   Patient Active Problem List   Diagnosis Date Noted   Degenerative lumbar spinal stenosis 08/16/2020   Acute blood loss as cause of postoperative anemia 04/03/2016   Class 3 obesity due to excess calories with body mass index (BMI) of 40.0 to 44.9 in adult    Primary localized osteoarthritis of right knee    CKD (chronic kidney  disease), stage III ([place]) 10/22/2015   Syncope 10/21/2015   Non-insulin dependent type 2 diabetes mellitus ([place]) 10/21/2015   Hypothyroidism 10/21/2015   Lumbar stenosis with neurogenic claudication 08/16/2014   Right rotator cuff tear    Anxiety    PONV (postoperative nausea and vomiting)    GERD (gastroesophageal reflux disease)    Arthritis    Thyroid disease    DIVERTICULOSIS-COLON 07/04/2009   Iron deficiency anemia 03/17/2009   PERSONAL HX COLONIC POLYPS 03/17/2009   SKIN RASH 02/23/2009   DIARRHEA 02/23/2009   FEVER, HX OF 02/23/2009   THYROID NODULE, LEFT 02/11/2009   Glaucoma 02/11/2009   Coronary atherosclerosis 02/11/2009   WOLFF (WOLFE)-PARKINSON-WHITE (WPW) SYNDROME 02/11/2009   DEGENERATIVE JOINT DISEASE, ANKLE 02/11/2009   Osteoarthritis of spine 02/11/2009   Essential hypertension 02/09/2009    REFERRING DIAG: Rehab postoperative lumbar fusion with instrumentation Unstable knee and ankle Knee replacement inside and back weakness   THERAPY DIAG:  Pain, lumbar region  Pain in joint of right knee  Muscle weakness (generalized)  Difficulty walking  PERTINENT HISTORY: R TKA 2018  PRECAUTIONS: N/A  SUBJECTIVE: Pt states she is doing well. She has been doing the home exercises and did not have extra pain.   PAIN:  Are you having pain? Yes VAS scale: 3/10 Pain location: Lower back Pain orientation: Left  PAIN TYPE: aching and dull Pain description: constant     OBJECTIVE:   TODAY'S TREATMENT: Pt seen for aquatic therapy today.  Treatment took place in water 3.25-4 ft in depth at the [facility] pool. Temp of water was 91.  Pt entered/exited the pool via stairs (step to pattern) with bilat rail. Edu given about ascend and descend     Warm up: waist deep fwd and back, sidestepping 4x each    Exercises; standing march 20x,  Seated fwd flexion and L sided stretch 5s 10x Seated flutter kick 20x STS 10x off bench 2x10 HR 20x Standing trunk  rotation 10x Ascend and descend stairs with cuing for sequence 1x - bilat UE support   Pt requires buoyancy for support and to offload joints with strengthening exercises. Viscosity of the water is needed for resistance of strengthening; water current perturbations provides challenge to standing balance unsupported, requiring increased core activation.  PATIENT EDUCATION:  Education details: stair sequencing and review, aquatic properties, anatomy, exercise progression, DOMS expectations, muscle firing,  envelope of function, HEP   Person educated: Patient Education method: Explanation, Demonstration, Tactile cues, Verbal cues, and Handouts Education comprehension: verbalized understanding, returned demonstration, verbal  cues required, and tactile cues required     HOME EXERCISE PROGRAM: Access Code: [email or web address] URL: [email or web address]   ASSESSMENT:   CLINICAL IMPRESSION: Pt with good tolerance to aquatic introduction at today's session with pt primarily in deeper end at roughly 10% BW. Pt with significant L side L/S stiffness and tightness limiting AROM. Pt able perform gentle stretching and ROM exercise though she is pain limited. Pt with difficulty with STS and required significant cuing for set up and body mechanics during transfers. Pt likely able to progress exercises at next session. Likely need to review stairs sequencing. Pt would benefit from continued skilled therapy in order to reach goals and maximize functional lumbopelvic  strength and ROM for prevention of further functional decline.     REHAB POTENTIAL: Fair     CLINICAL DECISION MAKING: Evolving/moderate complexity   EVALUATION COMPLEXITY: Moderate     GOALS:   SHORT TERM GOALS:   STG Name Target Date Goal status  1 Pt will become independent with HEP in order to demonstrate synthesis of PT education.   :  02/16/2021 INITIAL  2 Pt will demonstrate at least a 12.8 improvement in Oswestry Index in  order to demonstrate a clinically significant change in LBP and function.   :  03/02/2021 INITIAL  3 Pt will be able to demonstrate/report ability to sit/stand/sleep for extended periods of time without pain in order to demonstrate functional improvement and tolerance to static positioning.   : 03/02/2021 INITIAL  4 Pt will be able to demonstrate/report ability to walk >5 mins without pain in order to demonstrate functional improvement and tolerance to exercise and community mobility.   : 03/02/2021 INITIAL    LONG TERM GOALS:    LTG Name Target Date Goal status  1 Pt  will become independent with final HEP in order to demonstrate synthesis of PT education.   : 03/30/2021 INITIAL  2 Pt will be able to perform 5XSTS in under 12s  in order to demonstrate functional improvement above the cut off score for adults.   : 03/30/2021 INITIAL  3 Pt will demonstrate at least a 25 improvement in Oswestry Index in order to demonstrate a clinically significant change in LBP and function.  : 03/30/2021 INITIAL  4 Pt will be able to demonstrate TUG in under 10 sec in order to demonstrate functional improvement in LE function, strength, balance, and mobility for safety with community ambulation.   03/30/2021 INITIAL    PLAN: PT FREQUENCY: 2x/week   PT DURATION: 8 weeks   PLANNED INTERVENTIONS: Therapeutic exercises, Therapeutic activity, Neuro Muscular re-education, Balance training, Gait training, Patient/Family education, Joint mobilization, Stair training, Aquatic Therapy, Dry Needling, Electrical stimulation, Spinal mobilization, Cryotherapy, Moist heat, scar mobilization, Taping, Vasopneumatic device, Traction, Ultrasound, Ionotophoresis 4mg /ml Dexamethasone, and Manual therapy   PLAN FOR NEXT SESSION: aquatic introduction, gentle stretching, intro strength exercise, postural strength   Daleen Bo PT, DPT 02/06/21 12:53 PM

## 2021-02-08 ENCOUNTER — Ambulatory Visit (HOSPITAL_BASED_OUTPATIENT_CLINIC_OR_DEPARTMENT_OTHER): Payer: Medicare Other | Admitting: Physical Therapy

## 2021-02-08 ENCOUNTER — Other Ambulatory Visit: Payer: Self-pay

## 2021-02-08 ENCOUNTER — Encounter (HOSPITAL_BASED_OUTPATIENT_CLINIC_OR_DEPARTMENT_OTHER): Payer: Self-pay | Admitting: Physical Therapy

## 2021-02-08 DIAGNOSIS — M545 Low back pain, unspecified: Secondary | ICD-10-CM

## 2021-02-08 DIAGNOSIS — M25561 Pain in right knee: Secondary | ICD-10-CM

## 2021-02-08 DIAGNOSIS — Z09 Encounter for follow-up examination after completed treatment for conditions other than malignant neoplasm: Secondary | ICD-10-CM | POA: Diagnosis not present

## 2021-02-08 DIAGNOSIS — M6281 Muscle weakness (generalized): Secondary | ICD-10-CM

## 2021-02-08 DIAGNOSIS — R262 Difficulty in walking, not elsewhere classified: Secondary | ICD-10-CM

## 2021-02-08 NOTE — Therapy (Signed)
OUTPATIENT PHYSICAL THERAPY TREATMENT NOTE   Patient Name: [name] MRN: [id number] DOB:[birth year], 75 y.o., female Today's Date: 02/08/2021  PCP: Leanna Battles, MD REFERRING PROVIDER: Leanna Battles, MD   PT End of Session - 02/08/21 1721     Visit Number 3    Number of Visits 19    Date for PT Re-Evaluation 05/03/21    Authorization Type [employer] Medicare    PT Start Time 8592    PT Stop Time 1800    PT Time Calculation (min) 45 min    Activity Tolerance Patient tolerated treatment well;Patient limited by pain;Patient limited by fatigue    Behavior During Therapy [facility] for tasks assessed/performed             Past Medical History:  Diagnosis Date   Acute blood loss as cause of postoperative anemia 04/03/2016   Anemia    hx of   Anxiety    takes Xanax daily as needed   Arthritis    Bronchitis    a month ago   Chronic back pain    stenosis   Chronic kidney disease    stage 3-sees Dr.J Patel   Class 3 obesity due to excess calories with body mass index (BMI) of 40.0 to 44.9 in adult    Diabetes mellitus    type II; takes Byetta and Invokana daily   Glaucoma    uses Eye Drops   Gout    takes Uloric daily   History of blood transfusion    no abnormal reaction    History of colon polyps    Hyperlipidemia    no meds taken now   Hypertension    takes HCTZ daily as well as Aldactone   Hypothyroidism    takes Synthroid daily   Pneumonia    many yrs ago   PONV (postoperative nausea and vomiting)    Primary localized osteoarthritis of right knee    Stress incontinence    Syncope    Weakness    numbness and tingling in right hip/eg/   Wears glasses    Wolf-Parkinson-White syndrome    takes Atenolol daily   Past Surgical History:  Procedure Laterality Date   ABDOMINAL HYSTERECTOMY     APPENDECTOMY     BACK SURGERY  2006   lumb fusion   BACK SURGERY  08/16/2014   L3-4 fusion   BREAST BIOPSY     BREAST ENHANCEMENT SURGERY Bilateral    BREAST  EXCISIONAL BIOPSY Left    benign   CARDIAC CATHETERIZATION  >48yrs ago   CARPAL TUNNEL RELEASE Bilateral    x4   cataract surgery Bilateral    CESAREAN SECTION     x 3   CHOLECYSTECTOMY  1981   COLONOSCOPY     KNEE ARTHROSCOPY Right 2009   rt   RADIOLOGY WITH ANESTHESIA N/A 05/12/2020   Procedure: MRI LUMBAR SPINE WITH AND WITHOUT CONTRAST;  Surgeon: Radiologist, Medication, MD;  Location: [place];  Service: Radiology;  Laterality: N/A;   RAZ procedure  10+yrs ago   REDUCTION MAMMAPLASTY Bilateral 2006   ROTATOR CUFF REPAIR Right    x 4   SHOULDER ARTHROSCOPY WITH ROTATOR CUFF REPAIR AND SUBACROMIAL DECOMPRESSION Right 06/10/2012   Procedure: SHOULDER ARTHROSCOPY WITH ROTATOR CUFF REPAIR AND SUBACROMIAL DECOMPRESSION;  Surgeon: Lorn Junes, MD;  Location: [place];  Service: Orthopedics;  Laterality: Right;  RIGHT SHOULDER ARTHROSCOPY, DECOMPRESSION SUBACROMIAL PARTIAL ACROMIOPLASTY WITH CORACOACROMIAL RELEASE,, DISTAL CLAVICULECTOMY , ROTATOR CUFF debriedment  SKIN BIOPSY     removed moles all over body   THYROID SURGERY Right    right thyroid removed   TOTAL KNEE ARTHROPLASTY Right 04/02/2016   Procedure: RIGHT TOTAL KNEE ARTHROPLASTY;  Surgeon: Elsie Saas, MD;  Location: [place];  Service: Orthopedics;  Laterality: Right;   TRANSFORAMINAL LUMBAR INTERBODY FUSION (TLIF) WITH PEDICLE SCREW FIXATION 1 LEVEL Left 08/16/2020   Procedure: Left Lumbar two-three Transforaminal lumbar interbody fusion with exploration and extension of adjacent level fusion;  Surgeon: Erline Levine, MD;  Location: [place];  Service: Neurosurgery;  Laterality: Left;   Patient Active Problem List   Diagnosis Date Noted   Degenerative lumbar spinal stenosis 08/16/2020   Acute blood loss as cause of postoperative anemia 04/03/2016   Class 3 obesity due to excess calories with body mass index (BMI) of 40.0 to 44.9 in adult    Primary localized osteoarthritis of right knee    CKD (chronic kidney  disease), stage III ([place]) 10/22/2015   Syncope 10/21/2015   Non-insulin dependent type 2 diabetes mellitus ([place]) 10/21/2015   Hypothyroidism 10/21/2015   Lumbar stenosis with neurogenic claudication 08/16/2014   Right rotator cuff tear    Anxiety    PONV (postoperative nausea and vomiting)    GERD (gastroesophageal reflux disease)    Arthritis    Thyroid disease    DIVERTICULOSIS-COLON 07/04/2009   Iron deficiency anemia 03/17/2009   PERSONAL HX COLONIC POLYPS 03/17/2009   SKIN RASH 02/23/2009   DIARRHEA 02/23/2009   FEVER, HX OF 02/23/2009   THYROID NODULE, LEFT 02/11/2009   Glaucoma 02/11/2009   Coronary atherosclerosis 02/11/2009   WOLFF (WOLFE)-PARKINSON-WHITE (WPW) SYNDROME 02/11/2009   DEGENERATIVE JOINT DISEASE, ANKLE 02/11/2009   Osteoarthritis of spine 02/11/2009   Essential hypertension 02/09/2009    REFERRING DIAG: Rehab postoperative lumbar fusion with instrumentation Unstable knee and ankle Knee replacement inside and back weakness   THERAPY DIAG:  Pain, lumbar region  Pain in joint of right knee  Muscle weakness (generalized)  Difficulty walking  PERTINENT HISTORY: R TKA 2018  PRECAUTIONS: N/A  SUBJECTIVE: "Antony Haste said I would be soar after last session and he was right.  My back is a little more tight today"  PAIN:  Are you having pain? Yes VAS scale: 5/10 Pain location: Lower back Pain orientation: Left  PAIN TYPE: aching and dull Pain description: constant     OBJECTIVE:   TODAY'S TREATMENT: 02-08-21 Pt seen for aquatic therapy today.  Treatment took place in water 3.25-4 ft in depth at the [facility] pool. Temp of water was 91.  Pt entered/exited the pool via stairs (step to pattern) with bilat rail. Edu given about ascend and descend     Warm up: waist deep fwd and back, sidestepping 4x each    Seated Stretching: -Gastroc, hamstring and adductors 3 x 20 sec hold Strengthening: -Knee flex/ext 2 x 10 reps -flutter kicking at  hip 2 x 20 reps STS from 3 water step.  VC and demo for proper execution x 10  Standing Stretching: Gentle lb stretching pike position on steps 3 x 20 sec hold. Hip hiking R/L Exercises: -march 20x,  -hip hinges x10 vc and demo for proper execution -step ups on bottom step R/L forward x 10 with ue support then x 10 without. Side stepping R/L x 10 decreasing ue support.  VC and demo for weight shifting  Forward and backward water walking x 6 widths sues for increased step length and speed for aerobic capacity challenge.  Pt requires buoyancy for support and to offload joints with strengthening exercises. Viscosity of the water is needed for resistance of strengthening; water current perturbations provides challenge to standing balance unsupported, requiring increased core activation.    02-06-21 Pt seen for aquatic therapy today.  Treatment took place in water 3.25-4 ft in depth at the [facility] pool. Temp of water was 91.  Pt entered/exited the pool via stairs (step to pattern) with bilat rail. Edu given about ascend and descend     Warm up: waist deep fwd and back, sidestepping 4x each    Exercises; standing march 20x,  Seated fwd flexion and L sided stretch 5s 10x Seated flutter kick 20x STS 10x off bench 2x10 HR 20x Standing trunk rotation 10x Ascend and descend stairs with cuing for sequence 1x - bilat UE support   Pt requires buoyancy for support and to offload joints with strengthening exercises. Viscosity of the water is needed for resistance of strengthening; water current perturbations provides challenge to standing balance unsupported, requiring increased core activation.  PATIENT EDUCATION:  Education details: stair sequencing and review, aquatic properties, anatomy, exercise progression, DOMS expectations, muscle firing,  envelope of function, HEP   Person educated: Patient Education method: Explanation, Demonstration, Tactile cues, Verbal cues, and  Handouts Education comprehension: verbalized understanding, returned demonstration, verbal cues required, and tactile cues required     HOME EXERCISE PROGRAM: Access Code: [email or web address] URL: [email or web address]   ASSESSMENT:   CLINICAL IMPRESSION: Pt with good tolerance to session today.  C/o some increased lb stiffness, relieved by gentle stretching.  As noted previously, pt with significant L/S tightness and as noted  today significant hip flexor tightness limiting AROM.  Some cramping in LB with stretching hip flexors. Worked with pt on proper STS technique shifting weight for proper execution.    REHAB POTENTIAL: Fair     CLINICAL DECISION MAKING: Evolving/moderate complexity   EVALUATION COMPLEXITY: Moderate     GOALS:   SHORT TERM GOALS:   STG Name Target Date Goal status  1 Pt will become independent with HEP in order to demonstrate synthesis of PT education.   :  02/16/2021 INITIAL  2 Pt will demonstrate at least a 12.8 improvement in Oswestry Index in order to demonstrate a clinically significant change in LBP and function.   :  03/02/2021 INITIAL  3 Pt will be able to demonstrate/report ability to sit/stand/sleep for extended periods of time without pain in order to demonstrate functional improvement and tolerance to static positioning.   : 03/02/2021 INITIAL  4 Pt will be able to demonstrate/report ability to walk >5 mins without pain in order to demonstrate functional improvement and tolerance to exercise and community mobility.   : 03/02/2021 INITIAL    LONG TERM GOALS:    LTG Name Target Date Goal status  1 Pt  will become independent with final HEP in order to demonstrate synthesis of PT education.   : 03/30/2021 INITIAL  2 Pt will be able to perform 5XSTS in under 12s  in order to demonstrate functional improvement above the cut off score for adults.   : 03/30/2021 INITIAL  3 Pt will demonstrate at least a 25 improvement in Oswestry Index in order  to demonstrate a clinically significant change in LBP and function.  : 03/30/2021 INITIAL  4 Pt will be able to demonstrate TUG in under 10 sec in order to demonstrate functional improvement in LE function, strength, balance, and mobility for safety with community ambulation.  03/30/2021 INITIAL    PLAN: PT FREQUENCY: 2x/week   PT DURATION: 8 weeks   PLANNED INTERVENTIONS: Therapeutic exercises, Therapeutic activity, Neuro Muscular re-education, Balance training, Gait training, Patient/Family education, Joint mobilization, Stair training, Aquatic Therapy, Dry Needling, Electrical stimulation, Spinal mobilization, Cryotherapy, Moist heat, scar mobilization, Taping, Vasopneumatic device, Traction, Ultrasound, Ionotophoresis 4mg /ml Dexamethasone, and Manual therapy   PLAN FOR NEXT SESSION:gentle stretching, progress strength exercise, postural strength   Stanton Kidney Tharon Aquas) Djon Tith MPT 02/08/21 6:26 PM

## 2021-02-14 ENCOUNTER — Other Ambulatory Visit: Payer: Self-pay

## 2021-02-14 ENCOUNTER — Encounter (HOSPITAL_BASED_OUTPATIENT_CLINIC_OR_DEPARTMENT_OTHER): Payer: Self-pay | Admitting: Physical Therapy

## 2021-02-14 ENCOUNTER — Ambulatory Visit (HOSPITAL_BASED_OUTPATIENT_CLINIC_OR_DEPARTMENT_OTHER): Payer: Medicare Other | Admitting: Physical Therapy

## 2021-02-14 DIAGNOSIS — R2689 Other abnormalities of gait and mobility: Secondary | ICD-10-CM

## 2021-02-14 DIAGNOSIS — R262 Difficulty in walking, not elsewhere classified: Secondary | ICD-10-CM

## 2021-02-14 DIAGNOSIS — R293 Abnormal posture: Secondary | ICD-10-CM

## 2021-02-14 DIAGNOSIS — Z09 Encounter for follow-up examination after completed treatment for conditions other than malignant neoplasm: Secondary | ICD-10-CM | POA: Diagnosis not present

## 2021-02-14 DIAGNOSIS — G8929 Other chronic pain: Secondary | ICD-10-CM

## 2021-02-14 DIAGNOSIS — M25561 Pain in right knee: Secondary | ICD-10-CM

## 2021-02-14 DIAGNOSIS — R2681 Unsteadiness on feet: Secondary | ICD-10-CM

## 2021-02-14 DIAGNOSIS — M6281 Muscle weakness (generalized): Secondary | ICD-10-CM

## 2021-02-14 NOTE — Therapy (Signed)
OUTPATIENT PHYSICAL THERAPY TREATMENT NOTE   Patient Name: [name] MRN: [id number] DOB:07/05/1945, 75 y.o., female Today's Date: 02/14/2021  PCP: Leanna Battles, MD REFERRING PROVIDER: Leanna Battles, MD   PT End of Session - 02/14/21 0905     Visit Number 4    Number of Visits 19    Date for PT Re-Evaluation 05/03/21    Authorization Type [employer]HC Medicare    PT Start Time 0900    PT Stop Time 0945    PT Time Calculation (min) 45 min    Equipment Utilized During Treatment Other (comment)    Activity Tolerance Patient tolerated treatment well;Patient limited by pain;Patient limited by fatigue    Behavior During Therapy [facility] for tasks assessed/performed             Past Medical History:  Diagnosis Date   Acute blood loss as cause of postoperative anemia 04/03/2016   Anemia    hx of   Anxiety    takes Xanax daily as needed   Arthritis    Bronchitis    a month ago   Chronic back pain    stenosis   Chronic kidney disease    stage 3-sees Dr.J Patel   Class 3 obesity due to excess calories with body mass index (BMI) of 40.0 to 44.9 in adult    Diabetes mellitus    type II; takes Byetta and Invokana daily   Glaucoma    uses Eye Drops   Gout    takes Uloric daily   History of blood transfusion    no abnormal reaction    History of colon polyps    Hyperlipidemia    no meds taken now   Hypertension    takes HCTZ daily as well as Aldactone   Hypothyroidism    takes Synthroid daily   Pneumonia    many yrs ago   PONV (postoperative nausea and vomiting)    Primary localized osteoarthritis of right knee    Stress incontinence    Syncope    Weakness    numbness and tingling in right hip/eg/   Wears glasses    Wolf-Parkinson-White syndrome    takes Atenolol daily   Past Surgical History:  Procedure Laterality Date   ABDOMINAL HYSTERECTOMY     APPENDECTOMY     BACK SURGERY  2006   lumb fusion   BACK SURGERY  08/16/2014   L3-4 fusion   BREAST BIOPSY      BREAST ENHANCEMENT SURGERY Bilateral    BREAST EXCISIONAL BIOPSY Left    benign   CARDIAC CATHETERIZATION  >35yrs ago   CARPAL TUNNEL RELEASE Bilateral    x4   cataract surgery Bilateral    CESAREAN SECTION     x 3   CHOLECYSTECTOMY  1981   COLONOSCOPY     KNEE ARTHROSCOPY Right 2009   rt   RADIOLOGY WITH ANESTHESIA N/A 05/12/2020   Procedure: MRI LUMBAR SPINE WITH AND WITHOUT CONTRAST;  Surgeon: Radiologist, Medication, MD;  Location: [place];  Service: Radiology;  Laterality: N/A;   RAZ procedure  10+yrs ago   REDUCTION MAMMAPLASTY Bilateral 2006   ROTATOR CUFF REPAIR Right    x 4   SHOULDER ARTHROSCOPY WITH ROTATOR CUFF REPAIR AND SUBACROMIAL DECOMPRESSION Right 06/10/2012   Procedure: SHOULDER ARTHROSCOPY WITH ROTATOR CUFF REPAIR AND SUBACROMIAL DECOMPRESSION;  Surgeon: Lorn Junes, MD;  Location: [place];  Service: Orthopedics;  Laterality: Right;  RIGHT SHOULDER ARTHROSCOPY, DECOMPRESSION SUBACROMIAL PARTIAL ACROMIOPLASTY WITH CORACOACROMIAL  RELEASE,, DISTAL CLAVICULECTOMY , ROTATOR CUFF debriedment   SKIN BIOPSY     removed moles all over body   THYROID SURGERY Right    right thyroid removed   TOTAL KNEE ARTHROPLASTY Right 04/02/2016   Procedure: RIGHT TOTAL KNEE ARTHROPLASTY;  Surgeon: Elsie Saas, MD;  Location: [place];  Service: Orthopedics;  Laterality: Right;   TRANSFORAMINAL LUMBAR INTERBODY FUSION (TLIF) WITH PEDICLE SCREW FIXATION 1 LEVEL Left 08/16/2020   Procedure: Left Lumbar two-three Transforaminal lumbar interbody fusion with exploration and extension of adjacent level fusion;  Surgeon: Erline Levine, MD;  Location: [place];  Service: Neurosurgery;  Laterality: Left;   Patient Active Problem List   Diagnosis Date Noted   Degenerative lumbar spinal stenosis 08/16/2020   Acute blood loss as cause of postoperative anemia 04/03/2016   Class 3 obesity due to excess calories with body mass index (BMI) of 40.0 to 44.9 in adult    Primary localized  osteoarthritis of right knee    CKD (chronic kidney disease), stage III ([place]) 10/22/2015   Syncope 10/21/2015   Non-insulin dependent type 2 diabetes mellitus ([place]) 10/21/2015   Hypothyroidism 10/21/2015   Lumbar stenosis with neurogenic claudication 08/16/2014   Right rotator cuff tear    Anxiety    PONV (postoperative nausea and vomiting)    GERD (gastroesophageal reflux disease)    Arthritis    Thyroid disease    DIVERTICULOSIS-COLON 07/04/2009   Iron deficiency anemia 03/17/2009   PERSONAL HX COLONIC POLYPS 03/17/2009   SKIN RASH 02/23/2009   DIARRHEA 02/23/2009   FEVER, HX OF 02/23/2009   THYROID NODULE, LEFT 02/11/2009   Glaucoma 02/11/2009   Coronary atherosclerosis 02/11/2009   WOLFF (WOLFE)-PARKINSON-WHITE (WPW) SYNDROME 02/11/2009   DEGENERATIVE JOINT DISEASE, ANKLE 02/11/2009   Osteoarthritis of spine 02/11/2009   Essential hypertension 02/09/2009    REFERRING DIAG: Rehab postoperative lumbar fusion with instrumentation Unstable knee and ankle Knee replacement inside and back weakness   THERAPY DIAG:  Abnormal posture  Chronic pain of right knee  Difficulty in walking, not elsewhere classified  Muscle weakness (generalized)  Unsteadiness on feet  Other abnormalities of gait and mobility  PERTINENT HISTORY: R TKA 2018  PRECAUTIONS: N/A  SUBJECTIVE: "I feel pretty good, I think this is helping""  PAIN:  Are you having pain? Yes VAS scale: 5/10 Pain location: Lower back Pain orientation: Left  PAIN TYPE: aching and dull Pain description: constant     OBJECTIVE:   TODAY'S TREATMENT: 02/14/21 Pt seen for aquatic therapy today.  Treatment took place in water 3.25-4 ft in depth at the [facility] pool. Temp of water was 91.  Pt entered/exited the pool via stairs (step to pattern) with bilat rail. Edu given about ascend and descend     Warm up: waist deep fwd and back, sidestepping 4x each    Seated Stretching: -Gastroc, hamstring and  adductors 3 x 20 sec hold   Standing Stretching: Gentle lb stretching pike position on steps 3 x 20 sec hold. Hip hiking R/L Exercises: -march 20x,  -hip hinges x10 vc and demo for proper execution -step ups on bottom step R/L forward x 10 bottom step then x 10 second step to increase wb and ensure proper weight shifting without UE support  -Side stepping L x 10 decreasing ue support.  VC and demo for weight shifting -resisted hip extension 2 x 10 using water band  Forward and backward water walking x 2 widths between exercises for stretching and recovery  Pt requires  buoyancy for support and to offload joints with strengthening exercises. Viscosity of the water is needed for resistance of strengthening; water current perturbations provides challenge to standing balance unsupported, requiring increased core activation.    02-08-21 Pt seen for aquatic therapy today.  Treatment took place in water 3.25-4 ft in depth at the [facility] pool. Temp of water was 91.  Pt entered/exited the pool via stairs (step to pattern) with bilat rail. Edu given about ascend and descend     Warm up: waist deep fwd and back, sidestepping 4x each    Seated Stretching: -Gastroc, hamstring and adductors 3 x 20 sec hold Strengthening: -Knee flex/ext 2 x 10 reps -flutter kicking at hip 2 x 20 reps STS from 3 water step.  VC and demo for proper execution x 10  Standing Stretching: Gentle lb stretching pike position on steps 3 x 20 sec hold. Hip hiking R/L Exercises: -march 20x,  -hip hinges x10 vc and demo for proper execution -step ups on bottom step R/L forward x 10 with ue support then x 10 without. Side stepping R/L x 10 decreasing ue support.  VC and demo for weight shifting  Forward and backward water walking x 6 widths sues for increased step length and speed for aerobic capacity challenge.  Pt requires buoyancy for support and to offload joints with strengthening exercises. Viscosity  of the water is needed for resistance of strengthening; water current perturbations provides challenge to standing balance unsupported, requiring increased core activation.    02-06-21 Pt seen for aquatic therapy today.  Treatment took place in water 3.25-4 ft in depth at the [facility] pool. Temp of water was 91.  Pt entered/exited the pool via stairs (step to pattern) with bilat rail. Edu given about ascend and descend     Warm up: waist deep fwd and back, sidestepping 4x each    Exercises; standing march 20x,  Seated fwd flexion and L sided stretch 5s 10x Seated flutter kick 20x STS 10x off bench 2x10 HR 20x Standing trunk rotation 10x Ascend and descend stairs with cuing for sequence 1x - bilat UE support   Pt requires buoyancy for support and to offload joints with strengthening exercises. Viscosity of the water is needed for resistance of strengthening; water current perturbations provides challenge to standing balance unsupported, requiring increased core activation.  PATIENT EDUCATION:  Education details: stair sequencing and review, aquatic properties, anatomy, exercise progression, DOMS expectations, muscle firing,  envelope of function, HEP   Person educated: Patient Education method: Explanation, Demonstration, Tactile cues, Verbal cues, and Handouts Education comprehension: verbalized understanding, returned demonstration, verbal cues required, and tactile cues required     HOME EXERCISE PROGRAM: Access Code: [email or web address] Added aquatics 02-14-21 URL: [email or web address]   ASSESSMENT:   CLINICAL IMPRESSION: Pt with improved execution of hip hinges as well as tech for STS transfers. She reports practicing at home after last session. Weight shifting with step ups and stair climbing improved. Able to complete with minimal to no ue support submerged without discomfort. Focus today on lumbopelvic and hip stretching and glute strengthening    REHAB  POTENTIAL: Fair     CLINICAL DECISION MAKING: Evolving/moderate complexity   EVALUATION COMPLEXITY: Moderate     GOALS:   SHORT TERM GOALS:   STG Name Target Date Goal status  1 Pt will become independent with HEP in order to demonstrate synthesis of PT education.   :  02/16/2021 INITIAL  2 Pt will demonstrate at least a 12.8  improvement in Oswestry Index in order to demonstrate a clinically significant change in LBP and function.   :  03/02/2021 INITIAL  3 Pt will be able to demonstrate/report ability to sit/stand/sleep for extended periods of time without pain in order to demonstrate functional improvement and tolerance to static positioning.   : 03/02/2021 INITIAL  4 Pt will be able to demonstrate/report ability to walk >5 mins without pain in order to demonstrate functional improvement and tolerance to exercise and community mobility.   : 03/02/2021 INITIAL    LONG TERM GOALS:    LTG Name Target Date Goal status  1 Pt  will become independent with final HEP in order to demonstrate synthesis of PT education.   : 03/30/2021 INITIAL  2 Pt will be able to perform 5XSTS in under 12s  in order to demonstrate functional improvement above the cut off score for adults.   : 03/30/2021 INITIAL  3 Pt will demonstrate at least a 25 improvement in Oswestry Index in order to demonstrate a clinically significant change in LBP and function.  : 03/30/2021 INITIAL  4 Pt will be able to demonstrate TUG in under 10 sec in order to demonstrate functional improvement in LE function, strength, balance, and mobility for safety with community ambulation.   03/30/2021 INITIAL    PLAN: PT FREQUENCY: 2x/week   PT DURATION: 8 weeks   PLANNED INTERVENTIONS: Therapeutic exercises, Therapeutic activity, Neuro Muscular re-education, Balance training, Gait training, Patient/Family education, Joint mobilization, Stair training, Aquatic Therapy, Dry Needling, Electrical stimulation, Spinal mobilization,  Cryotherapy, Moist heat, scar mobilization, Taping, Vasopneumatic device, Traction, Ultrasound, Ionotophoresis 4mg /ml Dexamethasone, and Manual therapy   PLAN FOR NEXT SESSION:gentle stretching, progress strength exercise, postural strength   Stanton Kidney Tharon Aquas) Antigone Crowell MPT 02/14/21 12:26 PM

## 2021-02-16 ENCOUNTER — Ambulatory Visit (HOSPITAL_BASED_OUTPATIENT_CLINIC_OR_DEPARTMENT_OTHER): Payer: Medicare Other | Admitting: Physical Therapy

## 2021-02-21 ENCOUNTER — Other Ambulatory Visit: Payer: Self-pay

## 2021-02-21 ENCOUNTER — Encounter (HOSPITAL_BASED_OUTPATIENT_CLINIC_OR_DEPARTMENT_OTHER): Payer: Medicare Other | Attending: Physical Therapy | Admitting: Physical Therapy

## 2021-02-21 ENCOUNTER — Encounter (HOSPITAL_BASED_OUTPATIENT_CLINIC_OR_DEPARTMENT_OTHER): Payer: Self-pay | Admitting: Physical Therapy

## 2021-02-21 DIAGNOSIS — R2681 Unsteadiness on feet: Secondary | ICD-10-CM | POA: Diagnosis present

## 2021-02-21 DIAGNOSIS — R2689 Other abnormalities of gait and mobility: Secondary | ICD-10-CM | POA: Diagnosis present

## 2021-02-21 DIAGNOSIS — G8929 Other chronic pain: Secondary | ICD-10-CM

## 2021-02-21 DIAGNOSIS — R293 Abnormal posture: Secondary | ICD-10-CM

## 2021-02-21 DIAGNOSIS — M6281 Muscle weakness (generalized): Secondary | ICD-10-CM

## 2021-02-21 DIAGNOSIS — R262 Difficulty in walking, not elsewhere classified: Secondary | ICD-10-CM

## 2021-02-21 DIAGNOSIS — M25561 Pain in right knee: Secondary | ICD-10-CM | POA: Insufficient documentation

## 2021-02-21 NOTE — Therapy (Signed)
OUTPATIENT PHYSICAL THERAPY TREATMENT NOTE   Patient Name: [name] MRN: [id number] DOB:04-25-1946, 75 y.o., female Today's Date: 02/21/2021  PCP: Leanna Battles, MD REFERRING PROVIDER: Leanna Battles, MD   PT End of Session - 02/21/21 1342     Visit Number 5    Number of Visits 19    Date for PT Re-Evaluation 05/03/21    Authorization Type [employer] Medicare    PT Start Time 1517    PT Stop Time 1430    PT Time Calculation (min) 47 min    Equipment Utilized During Treatment Other (comment)    Activity Tolerance Patient tolerated treatment well;Patient limited by pain;Patient limited by fatigue    Behavior During Therapy [facility] for tasks assessed/performed             Past Medical History:  Diagnosis Date   Acute blood loss as cause of postoperative anemia 04/03/2016   Anemia    hx of   Anxiety    takes Xanax daily as needed   Arthritis    Bronchitis    a month ago   Chronic back pain    stenosis   Chronic kidney disease    stage 3-sees Dr.J Patel   Class 3 obesity due to excess calories with body mass index (BMI) of 40.0 to 44.9 in adult    Diabetes mellitus    type II; takes Byetta and Invokana daily   Glaucoma    uses Eye Drops   Gout    takes Uloric daily   History of blood transfusion    no abnormal reaction    History of colon polyps    Hyperlipidemia    no meds taken now   Hypertension    takes HCTZ daily as well as Aldactone   Hypothyroidism    takes Synthroid daily   Pneumonia    many yrs ago   PONV (postoperative nausea and vomiting)    Primary localized osteoarthritis of right knee    Stress incontinence    Syncope    Weakness    numbness and tingling in right hip/eg/   Wears glasses    Wolf-Parkinson-White syndrome    takes Atenolol daily   Past Surgical History:  Procedure Laterality Date   ABDOMINAL HYSTERECTOMY     APPENDECTOMY     BACK SURGERY  2006   lumb fusion   BACK SURGERY  08/16/2014   L3-4 fusion   BREAST BIOPSY      BREAST ENHANCEMENT SURGERY Bilateral    BREAST EXCISIONAL BIOPSY Left    benign   CARDIAC CATHETERIZATION  >60yrs ago   CARPAL TUNNEL RELEASE Bilateral    x4   cataract surgery Bilateral    CESAREAN SECTION     x 3   CHOLECYSTECTOMY  1981   COLONOSCOPY     KNEE ARTHROSCOPY Right 2009   rt   RADIOLOGY WITH ANESTHESIA N/A 05/12/2020   Procedure: MRI LUMBAR SPINE WITH AND WITHOUT CONTRAST;  Surgeon: Radiologist, Medication, MD;  Location: [place];  Service: Radiology;  Laterality: N/A;   RAZ procedure  10+yrs ago   REDUCTION MAMMAPLASTY Bilateral 2006   ROTATOR CUFF REPAIR Right    x 4   SHOULDER ARTHROSCOPY WITH ROTATOR CUFF REPAIR AND SUBACROMIAL DECOMPRESSION Right 06/10/2012   Procedure: SHOULDER ARTHROSCOPY WITH ROTATOR CUFF REPAIR AND SUBACROMIAL DECOMPRESSION;  Surgeon: Lorn Junes, MD;  Location: [place];  Service: Orthopedics;  Laterality: Right;  RIGHT SHOULDER ARTHROSCOPY, DECOMPRESSION SUBACROMIAL PARTIAL ACROMIOPLASTY WITH CORACOACROMIAL  RELEASE,, DISTAL CLAVICULECTOMY , ROTATOR CUFF debriedment   SKIN BIOPSY     removed moles all over body   THYROID SURGERY Right    right thyroid removed   TOTAL KNEE ARTHROPLASTY Right 04/02/2016   Procedure: RIGHT TOTAL KNEE ARTHROPLASTY;  Surgeon: Elsie Saas, MD;  Location: [place];  Service: Orthopedics;  Laterality: Right;   TRANSFORAMINAL LUMBAR INTERBODY FUSION (TLIF) WITH PEDICLE SCREW FIXATION 1 LEVEL Left 08/16/2020   Procedure: Left Lumbar two-three Transforaminal lumbar interbody fusion with exploration and extension of adjacent level fusion;  Surgeon: Erline Levine, MD;  Location: [place];  Service: Neurosurgery;  Laterality: Left;   Patient Active Problem List   Diagnosis Date Noted   Degenerative lumbar spinal stenosis 08/16/2020   Acute blood loss as cause of postoperative anemia 04/03/2016   Class 3 obesity due to excess calories with body mass index (BMI) of 40.0 to 44.9 in adult    Primary localized  osteoarthritis of right knee    CKD (chronic kidney disease), stage III ([place]) 10/22/2015   Syncope 10/21/2015   Non-insulin dependent type 2 diabetes mellitus ([place]) 10/21/2015   Hypothyroidism 10/21/2015   Lumbar stenosis with neurogenic claudication 08/16/2014   Right rotator cuff tear    Anxiety    PONV (postoperative nausea and vomiting)    GERD (gastroesophageal reflux disease)    Arthritis    Thyroid disease    DIVERTICULOSIS-COLON 07/04/2009   Iron deficiency anemia 03/17/2009   PERSONAL HX COLONIC POLYPS 03/17/2009   SKIN RASH 02/23/2009   DIARRHEA 02/23/2009   FEVER, HX OF 02/23/2009   THYROID NODULE, LEFT 02/11/2009   Glaucoma 02/11/2009   Coronary atherosclerosis 02/11/2009   WOLFF (WOLFE)-PARKINSON-WHITE (WPW) SYNDROME 02/11/2009   DEGENERATIVE JOINT DISEASE, ANKLE 02/11/2009   Osteoarthritis of spine 02/11/2009   Essential hypertension 02/09/2009    REFERRING DIAG: Rehab postoperative lumbar fusion with instrumentation Unstable knee and ankle Knee replacement inside and back weakness   THERAPY DIAG:  Abnormal posture  Chronic pain of right knee  Difficulty in walking, not elsewhere classified  Muscle weakness (generalized)  Unsteadiness on feet  Other abnormalities of gait and mobility  PERTINENT HISTORY: R TKA 2018  PRECAUTIONS: N/A  SUBJECTIVE: "This is helping so much, I felt so much better last week with you working with me. My daughter even said I was moving better""  PAIN:  Are you having pain? No pain anymore, just stiffness. 02/21/21 VAS scale: 0/10 Pain location: Lower back Pain orientation: Left  PAIN TYPE: aching and dull Pain description: constant     OBJECTIVE:   TODAY'S TREATMENT: 02/21/21 Pt seen for aquatic therapy today.  Treatment took place in water 3.25-4 ft in depth at the [facility] pool. Temp of water was 91.  Pt entered/exited the pool via stairs (step to pattern) with bilat rail. Edu given about ascend and  descend     Warm up: waist deep fwd and back, sidestepping 4-6x each. Added 1 foam hand buoys and pt amb 4 more widths to activate core.    Seated Stretching: -Gastroc, hamstring and adductors 3 x 20 sec hold   Standing Stretching: Gentle lb stretching pike position on steps 3 x 20 sec hold. Hip hiking R/L Exercises: With ankle buoys -march 2 x10 -add/abd x 10 -hip hinges x15  vc and demo for proper execution -STS from 3rd water step initially without UE assist, cues for weight shifting, then with ue pushing up from step 2 x 5 -kick board push downs 2  x15 with manual perturbation. -kick board rotations R/L x 10. VC and demonstration for proper execution -step ups on bottom step R/L forward x 10 bottom   -Side stepping L x 10 decreasing ue support.    Forward and backward water walking x 2 widths between exercises for stretching and recovery  Pt requires buoyancy for support and to offload joints with strengthening exercises. Viscosity of the water is needed for resistance of strengthening; water current perturbations provides challenge to standing balance unsupported, requiring increased core activation.     02/14/21 Pt seen for aquatic therapy today.  Treatment took place in water 3.25-4 ft in depth at the [facility] pool. Temp of water was 91.  Pt entered/exited the pool via stairs (step to pattern) with bilat rail. Edu given about ascend and descend     Warm up: waist deep fwd and back, sidestepping 4x each    Seated Stretching: -Gastroc, hamstring and adductors 3 x 20 sec hold   Standing Stretching: Gentle lb stretching pike position on steps 3 x 20 sec hold. Hip hiking R/L Exercises: -march 20x  -hip hinges x10 vc and demo for proper execution -step ups on bottom step R/L forward x 10 bottom step then x 10 second step to increase wb and ensure proper weight shifting without UE support  -Side stepping L x 10 decreasing ue support.  VC and demo for weight  shifting -resisted hip extension 2 x 10 using water band  Forward and backward water walking x 2 widths between exercises for stretching and recovery  Pt requires buoyancy for support and to offload joints with strengthening exercises. Viscosity of the water is needed for resistance of strengthening; water current perturbations provides challenge to standing balance unsupported, requiring increased core activation.    02-08-21 Pt seen for aquatic therapy today.  Treatment took place in water 3.25-4 ft in depth at the [facility] pool. Temp of water was 91.  Pt entered/exited the pool via stairs (step to pattern) with bilat rail. Edu given about ascend and descend     Warm up: waist deep fwd and back, sidestepping 4x each    Seated Stretching: -Gastroc, hamstring and adductors 3 x 20 sec hold Strengthening: -Knee flex/ext 2 x 10 reps -flutter kicking at hip 2 x 20 reps STS from 3 water step.  VC and demo for proper execution x 10  Standing Stretching: Gentle lb stretching pike position on steps 3 x 20 sec hold. Hip hiking R/L Exercises: -march 20x,  -hip hinges x10 vc and demo for proper execution -step ups on bottom step R/L forward x 10 with ue support then x 10 without. Side stepping R/L x 10 decreasing ue support.  VC and demo for weight shifting  Forward and backward water walking x 6 widths sues for increased step length and speed for aerobic capacity challenge.  Pt requires buoyancy for support and to offload joints with strengthening exercises. Viscosity of the water is needed for resistance of strengthening; water current perturbations provides challenge to standing balance unsupported, requiring increased core activation.    02-06-21 Pt seen for aquatic therapy today.  Treatment took place in water 3.25-4 ft in depth at the [facility] pool. Temp of water was 91.  Pt entered/exited the pool via stairs (step to pattern) with bilat rail. Edu given about  ascend and descend     Warm up: waist deep fwd and back, sidestepping 4x each    Exercises; standing march 20x,  Seated  fwd flexion and L sided stretch 5s 10x Seated flutter kick 20x STS 10x off bench 2x10 HR 20x Standing trunk rotation 10x Ascend and descend stairs with cuing for sequence 1x - bilat UE support   Pt requires buoyancy for support and to offload joints with strengthening exercises. Viscosity of the water is needed for resistance of strengthening; water current perturbations provides challenge to standing balance unsupported, requiring increased core activation.  PATIENT EDUCATION:  Education details: stair sequencing and review, aquatic properties, anatomy, exercise progression, DOMS expectations, muscle firing,  envelope of function, HEP   Person educated: Patient Education method: Explanation, Demonstration, Tactile cues, Verbal cues, and Handouts Education comprehension: verbalized understanding, returned demonstration, verbal cues required, and tactile cues required     HOME EXERCISE PROGRAM: Access Code: [email or web address] Added aquatics 02-14-21 URL: [email or web address]   ASSESSMENT:   CLINICAL IMPRESSION: Improvement in pain to only stiffness in LB.  Able to attain more aggressive LB stretch with pike on steps, feet on 2nd step. Added to core strengthening with rotational component. STS from 3rd water step demonstrating good execution of hip hinge into standing unsupported. No complaints of any discomfort. Tolerates session well requiring 2 seated recovery periods.    REHAB POTENTIAL: Fair     CLINICAL DECISION MAKING: Evolving/moderate complexity   EVALUATION COMPLEXITY: Moderate     GOALS:   SHORT TERM GOALS:   STG Name Target Date Goal status  1 Pt will become independent with HEP in order to demonstrate synthesis of PT education.   :  02/16/2021 INITIAL  2 Pt will demonstrate at least a 12.8 improvement in Oswestry Index in order to  demonstrate a clinically significant change in LBP and function.   :  03/02/2021 INITIAL  3 Pt will be able to demonstrate/report ability to sit/stand/sleep for extended periods of time without pain in order to demonstrate functional improvement and tolerance to static positioning.   : 03/02/2021 INITIAL  4 Pt will be able to demonstrate/report ability to walk >5 mins without pain in order to demonstrate functional improvement and tolerance to exercise and community mobility.   : 03/02/2021 INITIAL    LONG TERM GOALS:    LTG Name Target Date Goal status  1 Pt  will become independent with final HEP in order to demonstrate synthesis of PT education.   : 03/30/2021 INITIAL  2 Pt will be able to perform 5XSTS in under 12s  in order to demonstrate functional improvement above the cut off score for adults.   : 03/30/2021 INITIAL  3 Pt will demonstrate at least a 25 improvement in Oswestry Index in order to demonstrate a clinically significant change in LBP and function.  : 03/30/2021 INITIAL  4 Pt will be able to demonstrate TUG in under 10 sec in order to demonstrate functional improvement in LE function, strength, balance, and mobility for safety with community ambulation.   03/30/2021 INITIAL    PLAN: PT FREQUENCY: 2x/week   PT DURATION: 8 weeks   PLANNED INTERVENTIONS: Therapeutic exercises, Therapeutic activity, Neuro Muscular re-education, Balance training, Gait training, Patient/Family education, Joint mobilization, Stair training, Aquatic Therapy, Dry Needling, Electrical stimulation, Spinal mobilization, Cryotherapy, Moist heat, scar mobilization, Taping, Vasopneumatic device, Traction, Ultrasound, Ionotophoresis 4mg /ml Dexamethasone, and Manual therapy   PLAN FOR NEXT SESSION:gentle stretching, progress strength exercise, postural strength   Stanton Kidney Tharon Aquas) Daysen Gundrum MPT 02/21/21 2:39 PM

## 2021-02-23 ENCOUNTER — Encounter (HOSPITAL_BASED_OUTPATIENT_CLINIC_OR_DEPARTMENT_OTHER): Payer: Self-pay | Admitting: Physical Therapy

## 2021-02-23 ENCOUNTER — Other Ambulatory Visit (HOSPITAL_BASED_OUTPATIENT_CLINIC_OR_DEPARTMENT_OTHER): Payer: Self-pay

## 2021-02-23 ENCOUNTER — Ambulatory Visit (HOSPITAL_BASED_OUTPATIENT_CLINIC_OR_DEPARTMENT_OTHER): Payer: Medicare Other | Admitting: Physical Therapy

## 2021-02-23 ENCOUNTER — Ambulatory Visit: Payer: Medicare Other | Attending: Internal Medicine

## 2021-02-23 ENCOUNTER — Other Ambulatory Visit: Payer: Self-pay

## 2021-02-23 DIAGNOSIS — R2681 Unsteadiness on feet: Secondary | ICD-10-CM

## 2021-02-23 DIAGNOSIS — M6281 Muscle weakness (generalized): Secondary | ICD-10-CM

## 2021-02-23 DIAGNOSIS — R293 Abnormal posture: Secondary | ICD-10-CM

## 2021-02-23 DIAGNOSIS — R2689 Other abnormalities of gait and mobility: Secondary | ICD-10-CM

## 2021-02-23 DIAGNOSIS — Z09 Encounter for follow-up examination after completed treatment for conditions other than malignant neoplasm: Secondary | ICD-10-CM | POA: Diagnosis not present

## 2021-02-23 DIAGNOSIS — R262 Difficulty in walking, not elsewhere classified: Secondary | ICD-10-CM

## 2021-02-23 DIAGNOSIS — G8929 Other chronic pain: Secondary | ICD-10-CM

## 2021-02-23 DIAGNOSIS — Z23 Encounter for immunization: Secondary | ICD-10-CM

## 2021-02-23 MED ORDER — PFIZER COVID-19 VAC BIVALENT 30 MCG/0.3ML IM SUSP
INTRAMUSCULAR | 0 refills | Status: AC
  Filled 2021-02-23: qty 0.3, 1d supply, fill #0

## 2021-02-23 NOTE — Progress Notes (Signed)
   Covid-19 Vaccination Clinic  Name:  [name]    MRN: [id number] DOB: December 01, 1945  02/23/2021  [name] was observed post Covid-19 immunization for 15 minutes without incident. She was provided with Vaccine Information Sheet and instruction to access the V-Safe system.   [name] was instructed to call 911 with any severe reactions post vaccine: Difficulty breathing  Swelling of face and throat  A fast heartbeat  A bad rash all over body  Dizziness and weakness   Immunizations Administered     Name Date Dose VIS Date Route   Pfizer Covid-19 Vaccine Bivalent Booster 02/23/2021  1:30 PM 0.3 mL 12/28/2020 Intramuscular   Manufacturer: [place]   Lot: [id number]   [place]: [phone]

## 2021-02-23 NOTE — Therapy (Signed)
OUTPATIENT PHYSICAL THERAPY TREATMENT NOTE   Patient Name: [name] MRN: [id number] DOB:24-Mar-1946, 75 y.o., female Today's Date: 02/23/2021  PCP: Leanna Battles, MD REFERRING PROVIDER: Leanna Battles, MD   PT End of Session - 02/23/21 1012     Visit Number 6    Number of Visits 19    Date for PT Re-Evaluation 05/03/21    Authorization Type [employer] Medicare    PT Start Time 1015    PT Stop Time 1106    PT Time Calculation (min) 51 min    Equipment Utilized During Treatment Other (comment)    Activity Tolerance Patient tolerated treatment well;Patient limited by pain;Patient limited by fatigue    Behavior During Therapy [facility] for tasks assessed/performed             Past Medical History:  Diagnosis Date   Acute blood loss as cause of postoperative anemia 04/03/2016   Anemia    hx of   Anxiety    takes Xanax daily as needed   Arthritis    Bronchitis    a month ago   Chronic back pain    stenosis   Chronic kidney disease    stage 3-sees Dr.J Patel   Class 3 obesity due to excess calories with body mass index (BMI) of 40.0 to 44.9 in adult    Diabetes mellitus    type II; takes Byetta and Invokana daily   Glaucoma    uses Eye Drops   Gout    takes Uloric daily   History of blood transfusion    no abnormal reaction    History of colon polyps    Hyperlipidemia    no meds taken now   Hypertension    takes HCTZ daily as well as Aldactone   Hypothyroidism    takes Synthroid daily   Pneumonia    many yrs ago   PONV (postoperative nausea and vomiting)    Primary localized osteoarthritis of right knee    Stress incontinence    Syncope    Weakness    numbness and tingling in right hip/eg/   Wears glasses    Wolf-Parkinson-White syndrome    takes Atenolol daily   Past Surgical History:  Procedure Laterality Date   ABDOMINAL HYSTERECTOMY     APPENDECTOMY     BACK SURGERY  2006   lumb fusion   BACK SURGERY  08/16/2014   L3-4 fusion   BREAST BIOPSY      BREAST ENHANCEMENT SURGERY Bilateral    BREAST EXCISIONAL BIOPSY Left    benign   CARDIAC CATHETERIZATION  >14yr ago   CARPAL TUNNEL RELEASE Bilateral    x4   cataract surgery Bilateral    CESAREAN SECTION     x 3   CHOLECYSTECTOMY  1981   COLONOSCOPY     KNEE ARTHROSCOPY Right 2009   rt   RADIOLOGY WITH ANESTHESIA N/A 05/12/2020   Procedure: MRI LUMBAR SPINE WITH AND WITHOUT CONTRAST;  Surgeon: Radiologist, Medication, MD;  [facility]  Service: Radiology;  Laterality: N/A;   RAZ procedure  10+yrs ago   REDUCTION MAMMAPLASTY Bilateral 2006   ROTATOR CUFF REPAIR Right    x 4   SHOULDER ARTHROSCOPY WITH ROTATOR CUFF REPAIR AND SUBACROMIAL DECOMPRESSION Right 06/10/2012   Procedure: SHOULDER ARTHROSCOPY WITH ROTATOR CUFF REPAIR AND SUBACROMIAL DECOMPRESSION;  Surgeon: RLorn Junes MD;  Location: [facility]  Service: Orthopedics;  Laterality: Right;  RIGHT SHOULDER ARTHROSCOPY, DECOMPRESSION SUBACROMIAL PARTIAL ACROMIOPLASTY WITH CORACOACROMIAL  RELEASE,, DISTAL CLAVICULECTOMY , ROTATOR CUFF debriedment   SKIN BIOPSY     removed moles all over body   THYROID SURGERY Right    right thyroid removed   TOTAL KNEE ARTHROPLASTY Right 04/02/2016   Procedure: RIGHT TOTAL KNEE ARTHROPLASTY;  Surgeon: Elsie Saas, MD;  Location: [place];  Service: Orthopedics;  Laterality: Right;   TRANSFORAMINAL LUMBAR INTERBODY FUSION (TLIF) WITH PEDICLE SCREW FIXATION 1 LEVEL Left 08/16/2020   Procedure: Left Lumbar two-three Transforaminal lumbar interbody fusion with exploration and extension of adjacent level fusion;  Surgeon: Erline Levine, MD;  Location: [place];  Service: Neurosurgery;  Laterality: Left;   Patient Active Problem List   Diagnosis Date Noted   Degenerative lumbar spinal stenosis 08/16/2020   Acute blood loss as cause of postoperative anemia 04/03/2016   Class 3 obesity due to excess calories with body mass index (BMI) of 40.0 to 44.9 in adult    Primary localized  osteoarthritis of right knee    CKD (chronic kidney disease), stage III ([place]) 10/22/2015   Syncope 10/21/2015   Non-insulin dependent type 2 diabetes mellitus ([place]) 10/21/2015   Hypothyroidism 10/21/2015   Lumbar stenosis with neurogenic claudication 08/16/2014   Right rotator cuff tear    Anxiety    PONV (postoperative nausea and vomiting)    GERD (gastroesophageal reflux disease)    Arthritis    Thyroid disease    DIVERTICULOSIS-COLON 07/04/2009   Iron deficiency anemia 03/17/2009   PERSONAL HX COLONIC POLYPS 03/17/2009   SKIN RASH 02/23/2009   DIARRHEA 02/23/2009   FEVER, HX OF 02/23/2009   THYROID NODULE, LEFT 02/11/2009   Glaucoma 02/11/2009   Coronary atherosclerosis 02/11/2009   WOLFF (WOLFE)-PARKINSON-WHITE (WPW) SYNDROME 02/11/2009   DEGENERATIVE JOINT DISEASE, ANKLE 02/11/2009   Osteoarthritis of spine 02/11/2009   Essential hypertension 02/09/2009    REFERRING DIAG: Rehab postoperative lumbar fusion with instrumentation Unstable knee and ankle Knee replacement inside and back weakness   THERAPY DIAG:  Abnormal posture  Chronic pain of right knee  Difficulty in walking, not elsewhere classified  Muscle weakness (generalized)  Unsteadiness on feet  Other abnormalities of gait and mobility  PERTINENT HISTORY: R TKA 2018  PRECAUTIONS: N/A  SUBJECTIVE: "I was exhausted after last session went home and slept"  PAIN:  Are you having pain? No pain anymore, just stiffness. 02/21/21 VAS scale: 0/10 Pain location: Lower back Pain orientation: Left  PAIN TYPE: aching and dull Pain description: constant     OBJECTIVE:   TODAY'S TREATMENT:  02/23/21 Pt seen for aquatic therapy today.  Treatment took place in water 3.25-4 ft in depth at the [facility] pool. Temp of water was 91.  Pt entered/exited the pool via stairs (step to pattern) with bilat rail. Edu given about ascend and descend     Warm up: waist deep fwd and back, sidestepping 4-6x  each.     Seated Stretching:  -Gastroc, hamstring and adductors 3 x 20 sec hold. Pt completing indep prior to session.   Standing Stretching: Gentle lb stretching pike position on steps 3 x 20 sec hold. Hip hiking R/L Hamstring and gastroc stretch pike position. Exercises: -hip hinges x12  demo for proper execution using kick board -gentle core rotations using kick board -STS from 3rd water using ue to push up. Minimal vc for weight shifting. Pt with greatly improved execution -1/3 squats on bottom water step x10. VC and demo for proper positioning, muscle target focus and proper execution. -kick board push downs 3 x15  with manual perturbation. -kick board rotations R/L x 10. VC and demo given.  Improved execution.  UE with 1 foam buoy: -horizontal abd x 10; shoulder flex/ext x 10; add/abd x10  Forward and backward water walking x 2 widths between exercises for stretching and recovery  Pt requires buoyancy for support and to offload joints with strengthening exercises. Viscosity of the water is needed for resistance of strengthening; water current perturbations provides challenge to standing balance unsupported, requiring increased core activation.   02/21/21 Pt seen for aquatic therapy today.  Treatment took place in water 3.25-4 ft in depth at the [facility] pool. Temp of water was 91.  Pt entered/exited the pool via stairs (step to pattern) with bilat rail. Edu given about ascend and descend     Warm up: waist deep fwd and back, sidestepping 4-6x each. Added 1 foam hand buoys and pt amb 4 more widths to activate core.    Seated Stretching: -Gastroc, hamstring and adductors 3 x 20 sec hold   Standing Stretching: Gentle lb stretching pike position on steps 3 x 20 sec hold. Hip hiking R/L Exercises: With ankle buoys -march 2 x10 -add/abd x 10 -hip hinges x15  vc and demo for proper execution -STS from 3rd water step initially without UE assist, cues for weight  shifting, then with ue pushing up from step 2 x 5 -kick board push downs 2 x15 with manual perturbation. -kick board rotations R/L x 10. VC and demonstration for proper execution -step ups on bottom step R/L forward x 10 bottom   -Side stepping L x 10 decreasing ue support.    Forward and backward water walking x 2 widths between exercises for stretching and recovery  Pt requires buoyancy for support and to offload joints with strengthening exercises. Viscosity of the water is needed for resistance of strengthening; water current perturbations provides challenge to standing balance unsupported, requiring increased core activation.     02/14/21 Pt seen for aquatic therapy today.  Treatment took place in water 3.25-4 ft in depth at the [facility] pool. Temp of water was 91.  Pt entered/exited the pool via stairs (step to pattern) with bilat rail. Edu given about ascend and descend     Warm up: waist deep fwd and back, sidestepping 4x each    Seated Stretching: -Gastroc, hamstring and adductors 3 x 20 sec hold   Standing Stretching: Gentle lb stretching pike position on steps 3 x 20 sec hold. Hip hiking R/L Exercises: -march 20x  -hip hinges x10 vc and demo for proper execution -step ups on bottom step R/L forward x 10 bottom step then x 10 second step to increase wb and ensure proper weight shifting without UE support  -Side stepping L x 10 decreasing ue support.  VC and demo for weight shifting -resisted hip extension 2 x 10 using water band  Forward and backward water walking x 2 widths between exercises for stretching and recovery  Pt requires buoyancy for support and to offload joints with strengthening exercises. Viscosity of the water is needed for resistance of strengthening; water current perturbations provides challenge to standing balance unsupported, requiring increased core activation.    02-08-21 Pt seen for aquatic therapy today.  Treatment took place in  water 3.25-4 ft in depth at the [facility] pool. Temp of water was 91.  Pt entered/exited the pool via stairs (step to pattern) with bilat rail. Edu given about ascend and descend     Warm up: waist  deep fwd and back, sidestepping 4x each    Seated Stretching: -Gastroc, hamstring and adductors 3 x 20 sec hold Strengthening: -Knee flex/ext 2 x 10 reps -flutter kicking at hip 2 x 20 reps STS from 3 water step.  VC and demo for proper execution x 10  Standing Stretching: Gentle lb stretching pike position on steps 3 x 20 sec hold. Hip hiking R/L Exercises: -march 20x,  -hip hinges x10 vc and demo for proper execution -step ups on bottom step R/L forward x 10 with ue support then x 10 without. Side stepping R/L x 10 decreasing ue support.  VC and demo for weight shifting  Forward and backward water walking x 6 widths sues for increased step length and speed for aerobic capacity challenge.  Pt requires buoyancy for support and to offload joints with strengthening exercises. Viscosity of the water is needed for resistance of strengthening; water current perturbations provides challenge to standing balance unsupported, requiring increased core activation.    02-06-21 Pt seen for aquatic therapy today.  Treatment took place in water 3.25-4 ft in depth at the [facility] pool. Temp of water was 91.  Pt entered/exited the pool via stairs (step to pattern) with bilat rail. Edu given about ascend and descend     Warm up: waist deep fwd and back, sidestepping 4x each    Exercises; standing march 20x,  Seated fwd flexion and L sided stretch 5s 10x Seated flutter kick 20x STS 10x off bench 2x10 HR 20x Standing trunk rotation 10x Ascend and descend stairs with cuing for sequence 1x - bilat UE support   Pt requires buoyancy for support and to offload joints with strengthening exercises. Viscosity of the water is needed for resistance of strengthening; water current  perturbations provides challenge to standing balance unsupported, requiring increased core activation.  PATIENT EDUCATION:  Education details: stair sequencing and review, aquatic properties, anatomy, exercise progression, DOMS expectations, muscle firing,  envelope of function, HEP   Person educated: Patient Education method: Explanation, Demonstration, Tactile cues, Verbal cues, and Handouts Education comprehension: verbalized understanding, returned demonstration, verbal cues required, and tactile cues required     HOME EXERCISE PROGRAM: Access Code: [email or web address] Added aquatics 02-14-21 URL: [email or web address]   ASSESSMENT:   CLINICAL IMPRESSION: Improving indep with stretching and warm up prior to session. Pt executing LB and lateral core stretching with improvement. Cont without complaints of LB pain only fatigue from session.  Reports functionally she is able to rise from sofa now without pulling, able to push up as instructed here and she is not limited by pain, able to sleep/sit/stand for extended times without pain.  STG met. She continues to benefit from aquatic therapy, will continue to gain strength improve muscle length and functional mobility     REHAB POTENTIAL: Fair     CLINICAL DECISION MAKING: Evolving/moderate complexity   EVALUATION COMPLEXITY: Moderate     GOALS:   SHORT TERM GOALS:   STG Name Target Date Goal status  1 Pt will become independent with HEP in order to demonstrate synthesis of PT education.   :  02/16/2021 INITIAL  2 Pt will demonstrate at least a 12.8 improvement in Oswestry Index in order to demonstrate a clinically significant change in LBP and function.   :  03/02/2021 INITIAL  3 Pt will be able to demonstrate/report ability to sit/stand/sleep for extended periods of time without pain in order to demonstrate functional improvement and tolerance to static positioning.   : 03/02/2021 INITIAL Goal  met 02/23/2021  4 Pt will be  able to demonstrate/report ability to walk >5 mins without pain in order to demonstrate functional improvement and tolerance to exercise and community mobility.   : 03/02/2021 INITIAL    LONG TERM GOALS:    LTG Name Target Date Goal status  1 Pt  will become independent with final HEP in order to demonstrate synthesis of PT education.   : 03/30/2021 INITIAL  2 Pt will be able to perform 5XSTS in under 12s  in order to demonstrate functional improvement above the cut off score for adults.   : 03/30/2021 INITIAL  3 Pt will demonstrate at least a 25 improvement in Oswestry Index in order to demonstrate a clinically significant change in LBP and function.  : 03/30/2021 INITIAL  4 Pt will be able to demonstrate TUG in under 10 sec in order to demonstrate functional improvement in LE function, strength, balance, and mobility for safety with community ambulation.   03/30/2021 INITIAL    PLAN: PT FREQUENCY: 2x/week   PT DURATION: 8 weeks   PLANNED INTERVENTIONS: Therapeutic exercises, Therapeutic activity, Neuro Muscular re-education, Balance training, Gait training, Patient/Family education, Joint mobilization, Stair training, Aquatic Therapy, Dry Needling, Electrical stimulation, Spinal mobilization, Cryotherapy, Moist heat, scar mobilization, Taping, Vasopneumatic device, Traction, Ultrasound, Ionotophoresis 90m/ml Dexamethasone, and Manual therapy   PLAN FOR NEXT SESSION:gentle stretching, progress strength exercise, postural strength   MStanton Kidney([place]) Oskar Cretella MPT 02/23/21 11:08 AM

## 2021-02-28 ENCOUNTER — Ambulatory Visit (HOSPITAL_BASED_OUTPATIENT_CLINIC_OR_DEPARTMENT_OTHER): Payer: Medicare Other | Attending: Internal Medicine | Admitting: Physical Therapy

## 2021-02-28 ENCOUNTER — Encounter (HOSPITAL_BASED_OUTPATIENT_CLINIC_OR_DEPARTMENT_OTHER): Payer: Self-pay | Admitting: Physical Therapy

## 2021-02-28 ENCOUNTER — Other Ambulatory Visit: Payer: Self-pay

## 2021-02-28 DIAGNOSIS — R293 Abnormal posture: Secondary | ICD-10-CM | POA: Diagnosis present

## 2021-02-28 DIAGNOSIS — M545 Low back pain, unspecified: Secondary | ICD-10-CM | POA: Diagnosis present

## 2021-02-28 DIAGNOSIS — M6281 Muscle weakness (generalized): Secondary | ICD-10-CM | POA: Diagnosis present

## 2021-02-28 DIAGNOSIS — M25561 Pain in right knee: Secondary | ICD-10-CM

## 2021-02-28 DIAGNOSIS — R2681 Unsteadiness on feet: Secondary | ICD-10-CM | POA: Diagnosis present

## 2021-02-28 DIAGNOSIS — R2689 Other abnormalities of gait and mobility: Secondary | ICD-10-CM | POA: Diagnosis present

## 2021-02-28 DIAGNOSIS — R262 Difficulty in walking, not elsewhere classified: Secondary | ICD-10-CM | POA: Diagnosis present

## 2021-02-28 DIAGNOSIS — G8929 Other chronic pain: Secondary | ICD-10-CM | POA: Diagnosis present

## 2021-02-28 NOTE — Therapy (Signed)
OUTPATIENT PHYSICAL THERAPY TREATMENT NOTE   Patient Name: [name] MRN: [id number] DOB:13-Apr-1946, 75 y.o., female Today's Date: 02/28/2021  PCP: Leanna Battles, MD REFERRING PROVIDER: Leanna Battles, MD   PT End of Session - 02/28/21 [phone]     Visit Number 7    Number of Visits 19    Date for PT Re-Evaluation 05/03/21    Authorization Type [employer] Medicare    PT Start Time 0900    PT Stop Time 0945    PT Time Calculation (min) 45 min    Equipment Utilized During Treatment Other (comment)    Activity Tolerance Patient tolerated treatment well;Patient limited by pain;Patient limited by fatigue    Behavior During Therapy [facility] for tasks assessed/performed             Past Medical History:  Diagnosis Date   Acute blood loss as cause of postoperative anemia 04/03/2016   Anemia    hx of   Anxiety    takes Xanax daily as needed   Arthritis    Bronchitis    a month ago   Chronic back pain    stenosis   Chronic kidney disease    stage 3-sees Dr.J Patel   Class 3 obesity due to excess calories with body mass index (BMI) of 40.0 to 44.9 in adult    Diabetes mellitus    type II; takes Byetta and Invokana daily   Glaucoma    uses Eye Drops   Gout    takes Uloric daily   History of blood transfusion    no abnormal reaction    History of colon polyps    Hyperlipidemia    no meds taken now   Hypertension    takes HCTZ daily as well as Aldactone   Hypothyroidism    takes Synthroid daily   Pneumonia    many yrs ago   PONV (postoperative nausea and vomiting)    Primary localized osteoarthritis of right knee    Stress incontinence    Syncope    Weakness    numbness and tingling in right hip/eg/   Wears glasses    Wolf-Parkinson-White syndrome    takes Atenolol daily   Past Surgical History:  Procedure Laterality Date   ABDOMINAL HYSTERECTOMY     APPENDECTOMY     BACK SURGERY  2006   lumb fusion   BACK SURGERY  08/16/2014   L3-4 fusion   BREAST BIOPSY      BREAST ENHANCEMENT SURGERY Bilateral    BREAST EXCISIONAL BIOPSY Left    benign   CARDIAC CATHETERIZATION  >22yr ago   CARPAL TUNNEL RELEASE Bilateral    x4   cataract surgery Bilateral    CESAREAN SECTION     x 3   CHOLECYSTECTOMY  1981   COLONOSCOPY     KNEE ARTHROSCOPY Right 2009   rt   RADIOLOGY WITH ANESTHESIA N/A 05/12/2020   Procedure: MRI LUMBAR SPINE WITH AND WITHOUT CONTRAST;  Surgeon: Radiologist, Medication, MD;  Location: [facility]  Service: Radiology;  Laterality: N/A;   RAZ procedure  10+yrs ago   REDUCTION MAMMAPLASTY Bilateral 2006   ROTATOR CUFF REPAIR Right    x 4   SHOULDER ARTHROSCOPY WITH ROTATOR CUFF REPAIR AND SUBACROMIAL DECOMPRESSION Right 06/10/2012   Procedure: SHOULDER ARTHROSCOPY WITH ROTATOR CUFF REPAIR AND SUBACROMIAL DECOMPRESSION;  Surgeon: RLorn Junes MD;  Location[place] [facility]  Service: Orthopedics;  Laterality: Right;  RIGHT SHOULDER ARTHROSCOPY, DECOMPRESSION SUBACROMIAL PARTIAL ACROMIOPLASTY WITH CORACOACROMIAL  RELEASE,, DISTAL CLAVICULECTOMY , ROTATOR CUFF debriedment   SKIN BIOPSY     removed moles all over body   THYROID SURGERY Right    right thyroid removed   TOTAL KNEE ARTHROPLASTY Right 04/02/2016   Procedure: RIGHT TOTAL KNEE ARTHROPLASTY;  Surgeon: Elsie Saas, MD;  Location: [place];  Service: Orthopedics;  Laterality: Right;   TRANSFORAMINAL LUMBAR INTERBODY FUSION (TLIF) WITH PEDICLE SCREW FIXATION 1 LEVEL Left 08/16/2020   Procedure: Left Lumbar two-three Transforaminal lumbar interbody fusion with exploration and extension of adjacent level fusion;  Surgeon: Erline Levine, MD;  Location: [place];  Service: Neurosurgery;  Laterality: Left;   Patient Active Problem List   Diagnosis Date Noted   Degenerative lumbar spinal stenosis 08/16/2020   Acute blood loss as cause of postoperative anemia 04/03/2016   Class 3 obesity due to excess calories with body mass index (BMI) of 40.0 to 44.9 in adult    Primary localized  osteoarthritis of right knee    CKD (chronic kidney disease), stage III ([place]) 10/22/2015   Syncope 10/21/2015   Non-insulin dependent type 2 diabetes mellitus ([place]) 10/21/2015   Hypothyroidism 10/21/2015   Lumbar stenosis with neurogenic claudication 08/16/2014   Right rotator cuff tear    Anxiety    PONV (postoperative nausea and vomiting)    GERD (gastroesophageal reflux disease)    Arthritis    Thyroid disease    DIVERTICULOSIS-COLON 07/04/2009   Iron deficiency anemia 03/17/2009   PERSONAL HX COLONIC POLYPS 03/17/2009   SKIN RASH 02/23/2009   DIARRHEA 02/23/2009   FEVER, HX OF 02/23/2009   THYROID NODULE, LEFT 02/11/2009   Glaucoma 02/11/2009   Coronary atherosclerosis 02/11/2009   WOLFF (WOLFE)-PARKINSON-WHITE (WPW) SYNDROME 02/11/2009   DEGENERATIVE JOINT DISEASE, ANKLE 02/11/2009   Osteoarthritis of spine 02/11/2009   Essential hypertension 02/09/2009    REFERRING DIAG: Rehab postoperative lumbar fusion with instrumentation Unstable knee and ankle Knee replacement inside and back weakness   THERAPY DIAG:  Abnormal posture  Other abnormalities of gait and mobility  Chronic pain of right knee  Unsteadiness on feet  Difficulty in walking, not elsewhere classified  Muscle weakness (generalized)  PERTINENT HISTORY: R TKA 2018  PRECAUTIONS: N/A  SUBJECTIVE: "No pain just stiffness in left mid back area"  PAIN:  Are you having pain? No stiffness. 02/28/21 VAS scale: 0/10 Pain location: Lower back Pain orientation: Left  PAIN TYPE: aching and dull Pain description: constant     OBJECTIVE:   TODAY'S TREATMENT:  02/28/21  Pt seen for aquatic therapy today.  Treatment took place in water 3.25-4 ft in depth at the [facility] pool. Temp of water was 91.  Pt entered/exited the pool via stairs (step to pattern) with bilat rail. Edu given about ascend and descend   Warm up: waist deep fwd and back, sidestepping 4-6x each.      Standing Stretching: Gentle lb stretching pike position on steps 3 x 20 sec hold. Hip hiking R/L Hamstring and gastroc stretch pike position. Added: yoga side stretching position (Vasisthasana modified) into right side bending/open left side to decrease middle thoracic ms tightness Exercises: -Long leg forward kicks supported bilat with 2 foam hand buoys x 10 rapid then x 10 slow -Long leg backward kicks  x 10 slow -long leg flex then adduction kicks x10 slow -long leg abd then hip flex slow x 5 -gentle core rotations using kick board -kick board push downs 2x10 with manual perturbation. -kick board rotations R/L x 10.  UE with 1  foam buoy: -horizontal abd x 10; shoulder flex/ext x 10; add/abd x10  Forward and backward water walking x 2 widths between exercises for stretching and recovery 4 widths holding submerged 2 foam hand buoys x 4 widths.  Pt requires buoyancy for support and to offload joints with strengthening exercises. Viscosity of the water is needed for resistance of strengthening; water current perturbations provides challenge to standing balance unsupported, requiring increased core activation.   02/23/21 Pt seen for aquatic therapy today.  Treatment took place in water 3.25-4 ft in depth at the [facility] pool. Temp of water was 91.  Pt entered/exited the pool via stairs (step to pattern) with bilat rail. Edu given about ascend and descend     Warm up: waist deep fwd and back, sidestepping 4-6x each.     Seated Stretching:  -Gastroc, hamstring and adductors 3 x 20 sec hold. Pt completing indep prior to session.   Standing Stretching: Gentle lb stretching pike position on steps 3 x 20 sec hold. Hip hiking R/L Hamstring and gastroc stretch pike position. Exercises: -hip hinges x12  demo for proper execution using kick board -gentle core rotations using kick board -STS from 3rd water using ue to push up. Minimal vc for weight shifting. Pt with greatly  improved execution -1/3 squats on bottom water step x10. VC and demo for proper positioning, muscle target focus and proper execution. -kick board push downs 3 x15 with manual perturbation. -kick board rotations R/L x 10. VC and demo given.  Improved execution.  UE with 1 foam buoy: -horizontal abd x 10; shoulder flex/ext x 10; add/abd x10  Forward and backward water walking x 2 widths between exercises for stretching and recovery  Pt requires buoyancy for support and to offload joints with strengthening exercises. Viscosity of the water is needed for resistance of strengthening; water current perturbations provides challenge to standing balance unsupported, requiring increased core activation.   02/21/21 Pt seen for aquatic therapy today.  Treatment took place in water 3.25-4 ft in depth at the [facility] pool. Temp of water was 91.  Pt entered/exited the pool via stairs (step to pattern) with bilat rail. Edu given about ascend and descend     Warm up: waist deep fwd and back, sidestepping 4-6x each. Added 1 foam hand buoys and pt amb 4 more widths to activate core.    Seated Stretching: -Gastroc, hamstring and adductors 3 x 20 sec hold   Standing Stretching: Gentle lb stretching pike position on steps 3 x 20 sec hold. Hip hiking R/L Exercises: With ankle buoys -march 2 x10 -add/abd x 10 -hip hinges x15  vc and demo for proper execution -STS from 3rd water step initially without UE assist, cues for weight shifting, then with ue pushing up from step 2 x 5 -kick board push downs 2 x15 with manual perturbation. -kick board rotations R/L x 10. VC and demonstration for proper execution -step ups on bottom step R/L forward x 10 bottom   -Side stepping L x 10 decreasing ue support.    Forward and backward water walking x 2 widths between exercises for stretching and recovery  Pt requires buoyancy for support and to offload joints with strengthening exercises. Viscosity of  the water is needed for resistance of strengthening; water current perturbations provides challenge to standing balance unsupported, requiring increased core activation.     02/14/21 Pt seen for aquatic therapy today.  Treatment took place in water 3.25-4 ft in depth at the [facility] pool. Temp of water was 91.  Pt entered/exited the pool via stairs (step to pattern) with bilat rail. Edu given about ascend and descend     Warm up: waist deep fwd and back, sidestepping 4x each    Seated Stretching: -Gastroc, hamstring and adductors 3 x 20 sec hold   Standing Stretching: Gentle lb stretching pike position on steps 3 x 20 sec hold. Hip hiking R/L Exercises: -march 20x  -hip hinges x10 vc and demo for proper execution -step ups on bottom step R/L forward x 10 bottom step then x 10 second step to increase wb and ensure proper weight shifting without UE support  -Side stepping L x 10 decreasing ue support.  VC and demo for weight shifting -resisted hip extension 2 x 10 using water band  Forward and backward water walking x 2 widths between exercises for stretching and recovery  Pt requires buoyancy for support and to offload joints with strengthening exercises. Viscosity of the water is needed for resistance of strengthening; water current perturbations provides challenge to standing balance unsupported, requiring increased core activation.    02-08-21 Pt seen for aquatic therapy today.  Treatment took place in water 3.25-4 ft in depth at the [facility] pool. Temp of water was 91.  Pt entered/exited the pool via stairs (step to pattern) with bilat rail. Edu given about ascend and descend     Warm up: waist deep fwd and back, sidestepping 4x each    Seated Stretching: -Gastroc, hamstring and adductors 3 x 20 sec hold Strengthening: -Knee flex/ext 2 x 10 reps -flutter kicking at hip 2 x 20 reps STS from 3 water step.  VC and demo for proper execution x  10  Standing Stretching: Gentle lb stretching pike position on steps 3 x 20 sec hold. Hip hiking R/L Exercises: -march 20x,  -hip hinges x10 vc and demo for proper execution -step ups on bottom step R/L forward x 10 with ue support then x 10 without. Side stepping R/L x 10 decreasing ue support.  VC and demo for weight shifting  Forward and backward water walking x 6 widths sues for increased step length and speed for aerobic capacity challenge.  Pt requires buoyancy for support and to offload joints with strengthening exercises. Viscosity of the water is needed for resistance of strengthening; water current perturbations provides challenge to standing balance unsupported, requiring increased core activation.    02-06-21 Pt seen for aquatic therapy today.  Treatment took place in water 3.25-4 ft in depth at the [facility] pool. Temp of water was 91.  Pt entered/exited the pool via stairs (step to pattern) with bilat rail. Edu given about ascend and descend     Warm up: waist deep fwd and back, sidestepping 4x each    Exercises; standing march 20x,  Seated fwd flexion and L sided stretch 5s 10x Seated flutter kick 20x STS 10x off bench 2x10 HR 20x Standing trunk rotation 10x Ascend and descend stairs with cuing for sequence 1x - bilat UE support   Pt requires buoyancy for support and to offload joints with strengthening exercises. Viscosity of the water is needed for resistance of strengthening; water current perturbations provides challenge to standing balance unsupported, requiring increased core activation.  PATIENT EDUCATION:  Education details: stair sequencing and review, aquatic properties, anatomy, exercise progression, DOMS expectations, muscle firing,  envelope of function, HEP   Person educated: Patient Education method: Explanation, Demonstration, Tactile cues, Verbal cues, and Handouts Education comprehension:  verbalized understanding, returned  demonstration, verbal cues required, and tactile cues required     HOME EXERCISE PROGRAM: Access Code: [email or web address] Added aquatics 02-14-21 URL: [email or web address]   ASSESSMENT:   CLINICAL IMPRESSION: Added yoga position for left mid core/thoracic erector spinae group stretching in attempts to decrease tightness in area.  Pt reports feeling good stretch in target area with position.  Upon completion she did have minor ms spasms in area worked out with water walking in deep end and gentle core rotation exercises. Added also to proximal le strengthening to improve distal stability.      REHAB POTENTIAL: Fair     CLINICAL DECISION MAKING: Evolving/moderate complexity   EVALUATION COMPLEXITY: Moderate     GOALS:   SHORT TERM GOALS:   STG Name Target Date Goal status  1 Pt will become independent with HEP in order to demonstrate synthesis of PT education.   :  02/16/2021 INITIAL  2 Pt will demonstrate at least a 12.8 improvement in Oswestry Index in order to demonstrate a clinically significant change in LBP and function.   :  03/02/2021 INITIAL  3 Pt will be able to demonstrate/report ability to sit/stand/sleep for extended periods of time without pain in order to demonstrate functional improvement and tolerance to static positioning.   : 03/02/2021 INITIAL Goal met 02/23/2021  4 Pt will be able to demonstrate/report ability to walk >5 mins without pain in order to demonstrate functional improvement and tolerance to exercise and community mobility.   : 03/02/2021 INITIAL    LONG TERM GOALS:    LTG Name Target Date Goal status  1 Pt  will become independent with final HEP in order to demonstrate synthesis of PT education.   : 03/30/2021 INITIAL  2 Pt will be able to perform 5XSTS in under 12s  in order to demonstrate functional improvement above the cut off score for adults.   : 03/30/2021 INITIAL  3 Pt will demonstrate at least a 25 improvement in Oswestry Index  in order to demonstrate a clinically significant change in LBP and function.  : 03/30/2021 INITIAL  4 Pt will be able to demonstrate TUG in under 10 sec in order to demonstrate functional improvement in LE function, strength, balance, and mobility for safety with community ambulation.   03/30/2021 INITIAL    PLAN: PT FREQUENCY: 2x/week   PT DURATION: 8 weeks   PLANNED INTERVENTIONS: Therapeutic exercises, Therapeutic activity, Neuro Muscular re-education, Balance training, Gait training, Patient/Family education, Joint mobilization, Stair training, Aquatic Therapy, Dry Needling, Electrical stimulation, Spinal mobilization, Cryotherapy, Moist heat, scar mobilization, Taping, Vasopneumatic device, Traction, Ultrasound, Ionotophoresis 31m/ml Dexamethasone, and Manual therapy   PLAN FOR NEXT SESSION:  Decreased tightness in mid thoracic spine area??  MStanton Kidney(Tharon Aquas Sarea Fyfe MPT 02/28/21 10:09 AM

## 2021-03-02 ENCOUNTER — Ambulatory Visit (HOSPITAL_BASED_OUTPATIENT_CLINIC_OR_DEPARTMENT_OTHER): Payer: Medicare Other | Admitting: Physical Therapy

## 2021-03-02 ENCOUNTER — Other Ambulatory Visit: Payer: Self-pay

## 2021-03-02 ENCOUNTER — Encounter (HOSPITAL_BASED_OUTPATIENT_CLINIC_OR_DEPARTMENT_OTHER): Payer: Self-pay | Admitting: Physical Therapy

## 2021-03-02 DIAGNOSIS — R293 Abnormal posture: Secondary | ICD-10-CM | POA: Diagnosis not present

## 2021-03-02 DIAGNOSIS — R262 Difficulty in walking, not elsewhere classified: Secondary | ICD-10-CM

## 2021-03-02 DIAGNOSIS — M6281 Muscle weakness (generalized): Secondary | ICD-10-CM

## 2021-03-02 DIAGNOSIS — G8929 Other chronic pain: Secondary | ICD-10-CM

## 2021-03-02 DIAGNOSIS — M25561 Pain in right knee: Secondary | ICD-10-CM

## 2021-03-02 DIAGNOSIS — R2689 Other abnormalities of gait and mobility: Secondary | ICD-10-CM

## 2021-03-02 DIAGNOSIS — R2681 Unsteadiness on feet: Secondary | ICD-10-CM

## 2021-03-02 NOTE — Therapy (Signed)
OUTPATIENT PHYSICAL THERAPY TREATMENT NOTE   Patient Name: [name] MRN: [id number] DOB:10/12/1945, 75 y.o., female Today's Date: 03/02/2021  PCP: Leanna Battles, MD REFERRING PROVIDER: Leanna Battles, MD   PT End of Session - 03/02/21 1645     Visit Number 8    Number of Visits 19    Date for PT Re-Evaluation 05/03/21    Authorization Type [employer] Medicare    PT Start Time 1618    PT Stop Time 1658    PT Time Calculation (min) 40 min    Equipment Utilized During Treatment Other (comment)    Activity Tolerance Patient tolerated treatment well;Patient limited by pain;Patient limited by fatigue    Behavior During Therapy [facility] for tasks assessed/performed             Past Medical History:  Diagnosis Date   Acute blood loss as cause of postoperative anemia 04/03/2016   Anemia    hx of   Anxiety    takes Xanax daily as needed   Arthritis    Bronchitis    a month ago   Chronic back pain    stenosis   Chronic kidney disease    stage 3-sees Dr.J Patel   Class 3 obesity due to excess calories with body mass index (BMI) of 40.0 to 44.9 in adult    Diabetes mellitus    type II; takes Byetta and Invokana daily   Glaucoma    uses Eye Drops   Gout    takes Uloric daily   History of blood transfusion    no abnormal reaction    History of colon polyps    Hyperlipidemia    no meds taken now   Hypertension    takes HCTZ daily as well as Aldactone   Hypothyroidism    takes Synthroid daily   Pneumonia    many yrs ago   PONV (postoperative nausea and vomiting)    Primary localized osteoarthritis of right knee    Stress incontinence    Syncope    Weakness    numbness and tingling in right hip/eg/   Wears glasses    Wolf-Parkinson-White syndrome    takes Atenolol daily   Past Surgical History:  Procedure Laterality Date   ABDOMINAL HYSTERECTOMY     APPENDECTOMY     BACK SURGERY  2006   lumb fusion   BACK SURGERY  08/16/2014   L3-4 fusion   BREAST BIOPSY      BREAST ENHANCEMENT SURGERY Bilateral    BREAST EXCISIONAL BIOPSY Left    benign   CARDIAC CATHETERIZATION  >93yr ago   CARPAL TUNNEL RELEASE Bilateral    x4   cataract surgery Bilateral    CESAREAN SECTION     x 3   CHOLECYSTECTOMY  1981   COLONOSCOPY     KNEE ARTHROSCOPY Right 2009   rt   RADIOLOGY WITH ANESTHESIA N/A 05/12/2020   Procedure: MRI LUMBAR SPINE WITH AND WITHOUT CONTRAST;  Surgeon: Radiologist, Medication, MD;  Location[place] [facility]  Service: Radiology;  Laterality: N/A;   RAZ procedure  10+yrs ago   REDUCTION MAMMAPLASTY Bilateral 2006   ROTATOR CUFF REPAIR Right    x 4   SHOULDER ARTHROSCOPY WITH ROTATOR CUFF REPAIR AND SUBACROMIAL DECOMPRESSION Right 06/10/2012   Procedure: SHOULDER ARTHROSCOPY WITH ROTATOR CUFF REPAIR AND SUBACROMIAL DECOMPRESSION;  Surgeon: RLorn Junes MD;  Location: [facility]  Service: Orthopedics;  Laterality: Right;  RIGHT SHOULDER ARTHROSCOPY, DECOMPRESSION SUBACROMIAL PARTIAL ACROMIOPLASTY WITH CORACOACROMIAL  RELEASE,, DISTAL CLAVICULECTOMY , ROTATOR CUFF debriedment   SKIN BIOPSY     removed moles all over body   THYROID SURGERY Right    right thyroid removed   TOTAL KNEE ARTHROPLASTY Right 04/02/2016   Procedure: RIGHT TOTAL KNEE ARTHROPLASTY;  Surgeon: Elsie Saas, MD;  Location: [place];  Service: Orthopedics;  Laterality: Right;   TRANSFORAMINAL LUMBAR INTERBODY FUSION (TLIF) WITH PEDICLE SCREW FIXATION 1 LEVEL Left 08/16/2020   Procedure: Left Lumbar two-three Transforaminal lumbar interbody fusion with exploration and extension of adjacent level fusion;  Surgeon: Erline Levine, MD;  Location: [place];  Service: Neurosurgery;  Laterality: Left;   Patient Active Problem List   Diagnosis Date Noted   Degenerative lumbar spinal stenosis 08/16/2020   Acute blood loss as cause of postoperative anemia 04/03/2016   Class 3 obesity due to excess calories with body mass index (BMI) of 40.0 to 44.9 in adult    Primary localized  osteoarthritis of right knee    CKD (chronic kidney disease), stage III ([place]) 10/22/2015   Syncope 10/21/2015   Non-insulin dependent type 2 diabetes mellitus ([place]) 10/21/2015   Hypothyroidism 10/21/2015   Lumbar stenosis with neurogenic claudication 08/16/2014   Right rotator cuff tear    Anxiety    PONV (postoperative nausea and vomiting)    GERD (gastroesophageal reflux disease)    Arthritis    Thyroid disease    DIVERTICULOSIS-COLON 07/04/2009   Iron deficiency anemia 03/17/2009   PERSONAL HX COLONIC POLYPS 03/17/2009   SKIN RASH 02/23/2009   DIARRHEA 02/23/2009   FEVER, HX OF 02/23/2009   THYROID NODULE, LEFT 02/11/2009   Glaucoma 02/11/2009   Coronary atherosclerosis 02/11/2009   WOLFF (WOLFE)-PARKINSON-WHITE (WPW) SYNDROME 02/11/2009   DEGENERATIVE JOINT DISEASE, ANKLE 02/11/2009   Osteoarthritis of spine 02/11/2009   Essential hypertension 02/09/2009    REFERRING DIAG: Rehab postoperative lumbar fusion with instrumentation Unstable knee and ankle Knee replacement inside and back weakness   THERAPY DIAG:  Abnormal posture  Other abnormalities of gait and mobility  Chronic pain of right knee  Unsteadiness on feet  Difficulty in walking, not elsewhere classified  Muscle weakness (generalized)  PERTINENT HISTORY: R TKA 2018  PRECAUTIONS: N/A  SUBJECTIVE: "Same spot on left sided mid back is tight""  PAIN:  Are you having pain? No stiffness. 02/28/21 VAS scale: 0/10 Pain location: Lower back Pain orientation: Left  PAIN TYPE: aching and dull Pain description: constant     OBJECTIVE:   TODAY'S TREATMENT:  03/02/21 Pt seen for aquatic therapy today.  Treatment took place in water 3.25-4 ft in depth at the [facility] pool. Temp of water was 91.  Pt entered/exited the pool via stairs (step to pattern) with bilat rail. Edu given about ascend and descend   Warm up: waist deep fwd and back, sidestepping 4-6x each.      Standing Stretching: Gentle lb stretching pike position on steps 3 x 20 sec hold. Hip hiking R/L Hamstring and gastroc stretch pike position. yoga side stretching position (Vasisthasana modified) into right side bending/open left side to decrease middle thoracic ms tightness x 3 hold for 30 sec at initiation of treatment and completed again at end with some deep pressure/massage to trigger point  Exercises: -flutter kicking sitting on 3 water step from hip 3 x 20 -add/abd 3 x 20 UE with 1 foam buoy standing: -horizontal abd x 10; shoulder flex/ext x 10; add/abd x10.  Added shoulder retraction x10 -pilates supported by water bench long leg kicks x10  Forward and backward water walking x 2 widths between exercises for stretching and recovery 4 widths holding submerged 2 foam hand buoys.  Pt requires buoyancy for support and to offload joints with strengthening exercises. Viscosity of the water is needed for resistance of strengthening; water current perturbations provides challenge to standing balance unsupported, requiring increased core activation.   02/28/21 Pt seen for aquatic therapy today.  Treatment took place in water 3.25-4 ft in depth at the [facility] pool. Temp of water was 91.  Pt entered/exited the pool via stairs (step to pattern) with bilat rail. Edu given about ascend and descend   Warm up: waist deep fwd and back, sidestepping 4-6x each.     Standing Stretching: Gentle lb stretching pike position on steps 3 x 20 sec hold. Hip hiking R/L Hamstring and gastroc stretch pike position. Added: yoga side stretching position (Vasisthasana modified) into right side bending/open left side to decrease middle thoracic ms tightness Exercises: -Long leg forward kicks supported bilat with 2 foam hand buoys x 10 rapid then x 10 slow -Long leg backward kicks  x 10 slow -long leg flex then adduction kicks x10 slow -long leg abd then hip flex slow x 5 -gentle core rotations  using kick board -kick board push downs 2x10 with manual perturbation. -kick board rotations R/L x 10.  UE with 1 foam buoy: -horizontal abd x 10; shoulder flex/ext x 10; add/abd x10  Forward and backward water walking x 2 widths between exercises for stretching and recovery 4 widths holding submerged 2 foam hand buoys x 4 widths.  Pt requires buoyancy for support and to offload joints with strengthening exercises. Viscosity of the water is needed for resistance of strengthening; water current perturbations provides challenge to standing balance unsupported, requiring increased core activation.   02/23/21 Pt seen for aquatic therapy today.  Treatment took place in water 3.25-4 ft in depth at the [facility] pool. Temp of water was 91.  Pt entered/exited the pool via stairs (step to pattern) with bilat rail. Edu given about ascend and descend     Warm up: waist deep fwd and back, sidestepping 4-6x each.     Seated Stretching:  -Gastroc, hamstring and adductors 3 x 20 sec hold. Pt completing indep prior to session.   Standing Stretching: Gentle lb stretching pike position on steps 3 x 20 sec hold. Hip hiking R/L Hamstring and gastroc stretch pike position. Exercises: -hip hinges x12  demo for proper execution using kick board -gentle core rotations using kick board -STS from 3rd water using ue to push up. Minimal vc for weight shifting. Pt with greatly improved execution -1/3 squats on bottom water step x10. VC and demo for proper positioning, muscle target focus and proper execution. -kick board push downs 3 x15 with manual perturbation. -kick board rotations R/L x 10. VC and demo given.  Improved execution.  UE with 1 foam buoy: -horizontal abd x 10; shoulder flex/ext x 10; add/abd x10  Forward and backward water walking x 2 widths between exercises for stretching and recovery  Pt requires buoyancy for support and to offload joints with strengthening exercises.  Viscosity of the water is needed for resistance of strengthening; water current perturbations provides challenge to standing balance unsupported, requiring increased core activation.       PATIENT EDUCATION:  Education details: stair sequencing and review, aquatic properties, anatomy, exercise progression, DOMS expectations, muscle firing,  envelope of function, HEP   Person educated: Patient Education method: Explanation, Demonstration,  Tactile cues, Verbal cues, and Handouts Education comprehension: verbalized understanding, returned demonstration, verbal cues required, and tactile cues required     HOME EXERCISE PROGRAM: Access Code: [email or web address] Added aquatics 02-14-21 URL: [email or web address]   ASSESSMENT:   CLINICAL IMPRESSION: Completed yoga side stretching at beginning and end of session.  Provided deep pressure massage to trigger point about erector spinae left mid thoracic area.  Attempted pilates plank using noodle but pt apprehensive/unable to gain position. Completed long leg kicking supported by water bench instead. Pt with difficulty due to decreased strength in core but executed properly. Pt reporting new axial core stretches/yoga position loosened up tightened area for 1 full day.  Also reports she is now able to stand from chairs at home without pulling up or needing assistance.      REHAB POTENTIAL: Fair     CLINICAL DECISION MAKING: Evolving/moderate complexity   EVALUATION COMPLEXITY: Moderate     GOALS:   SHORT TERM GOALS:   STG Name Target Date Goal status  1 Pt will become independent with HEP in order to demonstrate synthesis of PT education.   :  02/16/2021 INITIAL  2 Pt will demonstrate at least a 12.8 improvement in Oswestry Index in order to demonstrate a clinically significant change in LBP and function.   :  03/02/2021 INITIAL  3 Pt will be able to demonstrate/report ability to sit/stand/sleep for extended periods of time without  pain in order to demonstrate functional improvement and tolerance to static positioning.   : 03/02/2021 INITIAL Goal met 02/23/2021  4 Pt will be able to demonstrate/report ability to walk >5 mins without pain in order to demonstrate functional improvement and tolerance to exercise and community mobility.   : 03/02/2021 INITIAL    LONG TERM GOALS:    LTG Name Target Date Goal status  1 Pt  will become independent with final HEP in order to demonstrate synthesis of PT education.   : 03/30/2021 INITIAL  2 Pt will be able to perform 5XSTS in under 12s  in order to demonstrate functional improvement above the cut off score for adults.   : 03/30/2021 INITIAL  3 Pt will demonstrate at least a 25 improvement in Oswestry Index in order to demonstrate a clinically significant change in LBP and function.  : 03/30/2021 INITIAL  4 Pt will be able to demonstrate TUG in under 10 sec in order to demonstrate functional improvement in LE function, strength, balance, and mobility for safety with community ambulation.   03/30/2021 INITIAL    PLAN: PT FREQUENCY: 2x/week   PT DURATION: 8 weeks   PLANNED INTERVENTIONS: Therapeutic exercises, Therapeutic activity, Neuro Muscular re-education, Balance training, Gait training, Patient/Family education, Joint mobilization, Stair training, Aquatic Therapy, Dry Needling, Electrical stimulation, Spinal mobilization, Cryotherapy, Moist heat, scar mobilization, Taping, Vasopneumatic device, Traction, Ultrasound, Ionotophoresis 65m/ml Dexamethasone, and Manual therapy   PLAN FOR NEXT SESSION:    MAnnamarie Major Courvoisier Hamblen MPT 03/02/21 6:48 PM

## 2021-03-06 ENCOUNTER — Encounter (HOSPITAL_BASED_OUTPATIENT_CLINIC_OR_DEPARTMENT_OTHER): Payer: Self-pay | Admitting: Physical Therapy

## 2021-03-06 ENCOUNTER — Other Ambulatory Visit: Payer: Self-pay

## 2021-03-06 ENCOUNTER — Ambulatory Visit (HOSPITAL_BASED_OUTPATIENT_CLINIC_OR_DEPARTMENT_OTHER): Payer: Medicare Other | Admitting: Physical Therapy

## 2021-03-06 DIAGNOSIS — R2681 Unsteadiness on feet: Secondary | ICD-10-CM

## 2021-03-06 DIAGNOSIS — R262 Difficulty in walking, not elsewhere classified: Secondary | ICD-10-CM

## 2021-03-06 DIAGNOSIS — M6281 Muscle weakness (generalized): Secondary | ICD-10-CM

## 2021-03-06 DIAGNOSIS — G8929 Other chronic pain: Secondary | ICD-10-CM

## 2021-03-06 DIAGNOSIS — R293 Abnormal posture: Secondary | ICD-10-CM | POA: Diagnosis not present

## 2021-03-06 DIAGNOSIS — M25561 Pain in right knee: Secondary | ICD-10-CM

## 2021-03-06 DIAGNOSIS — R2689 Other abnormalities of gait and mobility: Secondary | ICD-10-CM

## 2021-03-06 DIAGNOSIS — M545 Low back pain, unspecified: Secondary | ICD-10-CM

## 2021-03-06 NOTE — Therapy (Signed)
OUTPATIENT PHYSICAL THERAPY TREATMENT NOTE   Patient Name: [name] MRN: [id number] DOB:April 27, 1946, 75 y.o., female Today's Date: 03/06/2021  PCP: Leanna Battles, MD REFERRING PROVIDER: Leanna Battles, MD   PT End of Session - 03/06/21 1016     Visit Number 9    Number of Visits 19    Date for PT Re-Evaluation 05/03/21    Authorization Type [employer] Medicare    PT Start Time 5093    PT Stop Time 1045   pt request to leave early in order to get to next appt   PT Time Calculation (min) 30 min    Equipment Utilized During Treatment Other (comment)    Activity Tolerance Patient tolerated treatment well;Patient limited by pain;Patient limited by fatigue    Behavior During Therapy [facility] for tasks assessed/performed             Past Medical History:  Diagnosis Date   Acute blood loss as cause of postoperative anemia 04/03/2016   Anemia    hx of   Anxiety    takes Xanax Lindsey as needed   Arthritis    Bronchitis    a month ago   Chronic back pain    stenosis   Chronic kidney disease    stage 3-sees Dr.J Patel   Class 3 obesity due to excess calories with body mass index (BMI) of 40.0 to 44.9 in adult    Diabetes mellitus    type II; takes Byetta and Invokana Lindsey   Glaucoma    uses Eye Drops   Gout    takes Uloric Lindsey   History of blood transfusion    no abnormal reaction    History of colon polyps    Hyperlipidemia    no meds taken now   Hypertension    takes HCTZ Lindsey as well as Aldactone   Hypothyroidism    takes Synthroid Lindsey   Pneumonia    many yrs ago   PONV (postoperative nausea and vomiting)    Primary localized osteoarthritis of right knee    Stress incontinence    Syncope    Weakness    numbness and tingling in right hip/eg/   Wears glasses    Wolf-Parkinson-White syndrome    takes Atenolol Lindsey   Past Surgical History:  Procedure Laterality Date   ABDOMINAL HYSTERECTOMY     APPENDECTOMY     BACK SURGERY  2006   lumb fusion   BACK  SURGERY  08/16/2014   L3-4 fusion   BREAST BIOPSY     BREAST ENHANCEMENT SURGERY Bilateral    BREAST EXCISIONAL BIOPSY Left    benign   CARDIAC CATHETERIZATION  >19yr ago   CARPAL TUNNEL RELEASE Bilateral    x4   cataract surgery Bilateral    CESAREAN SECTION     x 3   CHOLECYSTECTOMY  1981   COLONOSCOPY     KNEE ARTHROSCOPY Right 2009   rt   RADIOLOGY WITH ANESTHESIA N/A 05/12/2020   Procedure: MRI LUMBAR SPINE WITH AND WITHOUT CONTRAST;  Surgeon: Radiologist, Medication, MD;  Location[place]  Service: Radiology;  Laterality: N/A;   RAZ procedure  10+yrs ago   REDUCTION MAMMAPLASTY Bilateral 2006   ROTATOR CUFF REPAIR Right    x 4   SHOULDER ARTHROSCOPY WITH ROTATOR CUFF REPAIR AND SUBACROMIAL DECOMPRESSION Right 06/10/2012   Procedure: SHOULDER ARTHROSCOPY WITH ROTATOR CUFF REPAIR AND SUBACROMIAL DECOMPRESSION;  Surgeon: RLorn Junes MD;  Location[place]  Service: Orthopedics;  Laterality: Right;  RIGHT SHOULDER ARTHROSCOPY, DECOMPRESSION SUBACROMIAL PARTIAL ACROMIOPLASTY WITH CORACOACROMIAL RELEASE,, DISTAL CLAVICULECTOMY , ROTATOR CUFF debriedment   SKIN BIOPSY     removed moles all over body   THYROID SURGERY Right    right thyroid removed   TOTAL KNEE ARTHROPLASTY Right 04/02/2016   Procedure: RIGHT TOTAL KNEE ARTHROPLASTY;  Surgeon: Elsie Saas, MD;  Location: [place];  Service: Orthopedics;  Laterality: Right;   TRANSFORAMINAL LUMBAR INTERBODY FUSION (TLIF) WITH PEDICLE SCREW FIXATION 1 LEVEL Left 08/16/2020   Procedure: Left Lumbar two-three Transforaminal lumbar interbody fusion with exploration and extension of adjacent level fusion;  Surgeon: Erline Levine, MD;  Location: [place];  Service: Neurosurgery;  Laterality: Left;   Patient Active Problem List   Diagnosis Date Noted   Degenerative lumbar spinal stenosis 08/16/2020   Acute blood loss as cause of postoperative anemia 04/03/2016   Class 3 obesity due to excess calories with body mass index  (BMI) of 40.0 to 44.9 in adult    Primary localized osteoarthritis of right knee    CKD (chronic kidney disease), stage III ([place]) 10/22/2015   Syncope 10/21/2015   Non-insulin dependent type 2 diabetes mellitus ([place]) 10/21/2015   Hypothyroidism 10/21/2015   Lumbar stenosis with neurogenic claudication 08/16/2014   Right rotator cuff tear    Anxiety    PONV (postoperative nausea and vomiting)    GERD (gastroesophageal reflux disease)    Arthritis    Thyroid disease    DIVERTICULOSIS-COLON 07/04/2009   Iron deficiency anemia 03/17/2009   PERSONAL HX COLONIC POLYPS 03/17/2009   SKIN RASH 02/23/2009   DIARRHEA 02/23/2009   FEVER, HX OF 02/23/2009   THYROID NODULE, LEFT 02/11/2009   Glaucoma 02/11/2009   Coronary atherosclerosis 02/11/2009   WOLFF (WOLFE)-PARKINSON-WHITE (WPW) SYNDROME 02/11/2009   DEGENERATIVE JOINT DISEASE, ANKLE 02/11/2009   Osteoarthritis of spine 02/11/2009   Essential hypertension 02/09/2009    REFERRING DIAG: Rehab postoperative lumbar fusion with instrumentation Unstable knee and ankle Knee replacement inside and back weakness   THERAPY DIAG:  Other abnormalities of gait and mobility  Chronic pain of right knee  Unsteadiness on feet  Pain, lumbar region  Pain in joint of right knee  Difficulty walking  Muscle weakness (generalized)  PERTINENT HISTORY: R TKA 2018  PRECAUTIONS: N/A  SUBJECTIVE:  Pt report not pain today. She states she felt great after Tharon Aquas showed her a standing side bending stretching for her L lower back.   PAIN:  Are you having pain? No stiffness. 02/28/21 VAS scale: 0/10 Pain location: Lower back Pain orientation: Left  PAIN TYPE: aching and dull Pain description: constant     OBJECTIVE:   TODAY'S TREATMENT:   03/06/21 Pt seen for aquatic therapy today.  Treatment took place in water 3.25-4 ft in depth at the [facility] pool. Temp of water was 94.  Pt entered/exited the pool via stairs (step to  pattern) with bilat rail.    Warm up: waist deep fwd and back, sidestepping 4-6x each.     Standing Standing lumbar flexion stretch 10s 5x, double UE holding onto pool edge Standing QL/ pilates stretch at pool bench 30s 2x Walking 1x pool length between stretch for recovery  Exercises: walking 1x pool length between sets as need for active recovery -standing minisquat with 5lb ankle weight 2x10 -flutter kicking sitting on 3 water step from hip 3 x 20 -Standing board press and row 2x10 - SL balance with double UE yellow DB support 30s each  *Session stopped  early due another pt appt time*   Pt requires buoyancy for support and to offload joints with strengthening exercises. Viscosity of the water is needed for resistance of strengthening; water current perturbations provides challenge to standing balance unsupported, requiring increased core activation.   03/02/21 Pt seen for aquatic therapy today.  Treatment took place in water 3.25-4 ft in depth at the [facility] pool. Temp of water was 91.  Pt entered/exited the pool via stairs (step to pattern) with bilat rail. Edu given about ascend and descend   Warm up: waist deep fwd and back, sidestepping 4-6x each.     Standing Stretching: Gentle lb stretching pike position on steps 3 x 20 sec hold. Hip hiking R/L Hamstring and gastroc stretch pike position. yoga side stretching position (Vasisthasana modified) into right side bending/open left side to decrease middle thoracic ms tightness x 3 hold for 30 sec at initiation of treatment and completed again at end with some deep pressure/massage to trigger point  Exercises: -flutter kicking sitting on 3 water step from hip 3 x 20 -add/abd 3 x 20 UE with 1 foam buoy standing: -horizontal abd x 10; shoulder flex/ext x 10; add/abd x10.  Added shoulder retraction x10 -pilates supported by water bench long leg kicks x10  Forward and backward water walking x 2 widths between exercises  for stretching and recovery 4 widths holding submerged 2 foam hand buoys.  Pt requires buoyancy for support and to offload joints with strengthening exercises. Viscosity of the water is needed for resistance of strengthening; water current perturbations provides challenge to standing balance unsupported, requiring increased core activation.   02/28/21 Pt seen for aquatic therapy today.  Treatment took place in water 3.25-4 ft in depth at the [facility] pool. Temp of water was 91.  Pt entered/exited the pool via stairs (step to pattern) with bilat rail. Edu given about ascend and descend   Warm up: waist deep fwd and back, sidestepping 4-6x each.     Standing Stretching: Gentle lb stretching pike position on steps 3 x 20 sec hold. Hip hiking R/L Hamstring and gastroc stretch pike position. Added: yoga side stretching position (Vasisthasana modified) into right side bending/open left side to decrease middle thoracic ms tightness Exercises: -Long leg forward kicks supported bilat with 2 foam hand buoys x 10 rapid then x 10 slow -Long leg backward kicks  x 10 slow -long leg flex then adduction kicks x10 slow -long leg abd then hip flex slow x 5 -gentle core rotations using kick board -kick board push downs 2x10 with manual perturbation. -kick board rotations R/L x 10.  UE with 1 foam buoy: -horizontal abd x 10; shoulder flex/ext x 10; add/abd x10  Forward and backward water walking x 2 widths between exercises for stretching and recovery 4 widths holding submerged 2 foam hand buoys x 4 widths.  Pt requires buoyancy for support and to offload joints with strengthening exercises. Viscosity of the water is needed for resistance of strengthening; water current perturbations provides challenge to standing balance unsupported, requiring increased core activation.        PATIENT EDUCATION:  Education details: stair sequencing and review, aquatic properties, anatomy, exercise  progression, DOMS expectations, muscle firing,  envelope of function, HEP   Person educated: Patient Education method: Explanation, Demonstration, Tactile cues, Verbal cues, and Handouts Education comprehension: verbalized understanding, returned demonstration, verbal cues required, and tactile cues required     HOME EXERCISE PROGRAM: Access Code: [email or web address] Added aquatics 02-14-21 URL: [email or web address]  ASSESSMENT:   CLINICAL IMPRESSION: Pt with good tolerance to lumbopelvic strengthening activity at today's session without exacerbation of pain. Pt demonstrates significant functional mobility improvements, especially with STS movement and SL stability. Pt still with core stability weakness.  Plan to perform objective measurements prior to getting into pool at next session or soon thereafter for reassessment (potential scheduling conflict). Pt would benefit from continued skilled therapy in order to reach goals and maximize functional lumbopelvic strength and mobility for prevention of further functional decline.       REHAB POTENTIAL: Fair     CLINICAL DECISION MAKING: Evolving/moderate complexity   EVALUATION COMPLEXITY: Moderate     GOALS:   SHORT TERM GOALS:   STG Name Target Date Goal status  1 Pt will become independent with HEP in order to demonstrate synthesis of PT education.   :  02/16/2021 INITIAL  2 Pt will demonstrate at least a 12.8 improvement in Oswestry Index in order to demonstrate a clinically significant change in LBP and function.   :  03/02/2021 INITIAL  3 Pt will be able to demonstrate/report ability to sit/stand/sleep for extended periods of time without pain in order to demonstrate functional improvement and tolerance to static positioning.   : 03/02/2021 INITIAL Goal met 02/23/2021  4 Pt will be able to demonstrate/report ability to walk >5 mins without pain in order to demonstrate functional improvement and tolerance to exercise and  community mobility.   : 03/02/2021 INITIAL    LONG TERM GOALS:    LTG Name Target Date Goal status  1 Pt  will become independent with final HEP in order to demonstrate synthesis of PT education.   : 03/30/2021 INITIAL  2 Pt will be able to perform 5XSTS in under 12s  in order to demonstrate functional improvement above the cut off score for adults.   : 03/30/2021 INITIAL  3 Pt will demonstrate at least a 25 improvement in Oswestry Index in order to demonstrate a clinically significant change in LBP and function.  : 03/30/2021 INITIAL  4 Pt will be able to demonstrate TUG in under 10 sec in order to demonstrate functional improvement in LE function, strength, balance, and mobility for safety with community ambulation.   03/30/2021 INITIAL    PLAN: PT FREQUENCY: 2x/week   PT DURATION: 8 weeks   PLANNED INTERVENTIONS: Therapeutic exercises, Therapeutic activity, Neuro Muscular re-education, Balance training, Gait training, Patient/Family education, Joint mobilization, Stair training, Aquatic Therapy, Dry Needling, Electrical stimulation, Spinal mobilization, Cryotherapy, Moist heat, scar mobilization, Taping, Vasopneumatic device, Traction, Ultrasound, Ionotophoresis 21m/ml Dexamethasone, and Manual therapy   PLAN FOR NEXT SESSION: progress SL stability/balance; measure 5XSTS, LE strength MMT, and Oswestry Index*    ADaleen BoPT, DPT 03/06/21 10:50 AM

## 2021-03-07 ENCOUNTER — Other Ambulatory Visit: Payer: Self-pay | Admitting: Internal Medicine

## 2021-03-07 DIAGNOSIS — Z1231 Encounter for screening mammogram for malignant neoplasm of breast: Secondary | ICD-10-CM

## 2021-03-09 ENCOUNTER — Ambulatory Visit (HOSPITAL_BASED_OUTPATIENT_CLINIC_OR_DEPARTMENT_OTHER): Payer: Medicare Other | Admitting: Physical Therapy

## 2021-03-14 ENCOUNTER — Ambulatory Visit (HOSPITAL_BASED_OUTPATIENT_CLINIC_OR_DEPARTMENT_OTHER): Payer: Medicare Other | Admitting: Physical Therapy

## 2021-03-16 ENCOUNTER — Ambulatory Visit (HOSPITAL_BASED_OUTPATIENT_CLINIC_OR_DEPARTMENT_OTHER): Payer: Medicare Other | Admitting: Physical Therapy

## 2021-03-16 ENCOUNTER — Encounter (HOSPITAL_BASED_OUTPATIENT_CLINIC_OR_DEPARTMENT_OTHER): Payer: Self-pay | Admitting: Physical Therapy

## 2021-03-16 ENCOUNTER — Other Ambulatory Visit: Payer: Self-pay

## 2021-03-16 DIAGNOSIS — R2681 Unsteadiness on feet: Secondary | ICD-10-CM

## 2021-03-16 DIAGNOSIS — M6281 Muscle weakness (generalized): Secondary | ICD-10-CM

## 2021-03-16 DIAGNOSIS — G8929 Other chronic pain: Secondary | ICD-10-CM

## 2021-03-16 DIAGNOSIS — M545 Low back pain, unspecified: Secondary | ICD-10-CM

## 2021-03-16 DIAGNOSIS — R262 Difficulty in walking, not elsewhere classified: Secondary | ICD-10-CM

## 2021-03-16 DIAGNOSIS — R293 Abnormal posture: Secondary | ICD-10-CM | POA: Diagnosis not present

## 2021-03-16 NOTE — Therapy (Signed)
OUTPATIENT PHYSICAL THERAPY PROGRESS NOTE   Patient Name: [name] MRN: [id number] DOB:1945/10/10, 75 y.o., female Today's Date: 03/16/2021  PCP: Leanna Battles, MD REFERRING PROVIDER: Leanna Battles, MD   PT End of Session - 03/16/21 1009     Visit Number 10    Number of Visits 19    Date for PT Re-Evaluation 05/03/21    Authorization Type [employer] Medicare    PT Start Time 1015    PT Stop Time 1055    PT Time Calculation (min) 40 min    Equipment Utilized During Treatment Other (comment)    Activity Tolerance Patient tolerated treatment well;Patient limited by pain;Patient limited by fatigue    Behavior During Therapy [facility] for tasks assessed/performed             Past Medical History:  Diagnosis Date   Acute blood loss as cause of postoperative anemia 04/03/2016   Anemia    hx of   Anxiety    takes Xanax daily as needed   Arthritis    Bronchitis    a month ago   Chronic back pain    stenosis   Chronic kidney disease    stage 3-sees Dr.J Patel   Class 3 obesity due to excess calories with body mass index (BMI) of 40.0 to 44.9 in adult    Diabetes mellitus    type II; takes Byetta and Invokana daily   Glaucoma    uses Eye Drops   Gout    takes Uloric daily   History of blood transfusion    no abnormal reaction    History of colon polyps    Hyperlipidemia    no meds taken now   Hypertension    takes HCTZ daily as well as Aldactone   Hypothyroidism    takes Synthroid daily   Pneumonia    many yrs ago   PONV (postoperative nausea and vomiting)    Primary localized osteoarthritis of right knee    Stress incontinence    Syncope    Weakness    numbness and tingling in right hip/eg/   Wears glasses    Wolf-Parkinson-White syndrome    takes Atenolol daily   Past Surgical History:  Procedure Laterality Date   ABDOMINAL HYSTERECTOMY     APPENDECTOMY     BACK SURGERY  2006   lumb fusion   BACK SURGERY  08/16/2014   L3-4 fusion   BREAST BIOPSY      BREAST ENHANCEMENT SURGERY Bilateral    BREAST EXCISIONAL BIOPSY Left    benign   CARDIAC CATHETERIZATION  >78yr ago   CARPAL TUNNEL RELEASE Bilateral    x4   cataract surgery Bilateral    CESAREAN SECTION     x 3   CHOLECYSTECTOMY  1981   COLONOSCOPY     KNEE ARTHROSCOPY Right 2009   rt   RADIOLOGY WITH ANESTHESIA N/A 05/12/2020   Procedure: MRI LUMBAR SPINE WITH AND WITHOUT CONTRAST;  Surgeon: Radiologist, Medication, MD;  [place]  Service: Radiology;  Laterality: N/A;   RAZ procedure  10+yrs ago   REDUCTION MAMMAPLASTY Bilateral 2006   ROTATOR CUFF REPAIR Right    x 4   SHOULDER ARTHROSCOPY WITH ROTATOR CUFF REPAIR AND SUBACROMIAL DECOMPRESSION Right 06/10/2012   Procedure: SHOULDER ARTHROSCOPY WITH ROTATOR CUFF REPAIR AND SUBACROMIAL DECOMPRESSION;  Surgeon: RLorn Junes MD;  [place]  Service: Orthopedics;  Laterality: Right;  RIGHT SHOULDER ARTHROSCOPY, DECOMPRESSION SUBACROMIAL PARTIAL ACROMIOPLASTY WITH CORACOACROMIAL  RELEASE,, DISTAL CLAVICULECTOMY , ROTATOR CUFF debriedment   SKIN BIOPSY     removed moles all over body   THYROID SURGERY Right    right thyroid removed   TOTAL KNEE ARTHROPLASTY Right 04/02/2016   Procedure: RIGHT TOTAL KNEE ARTHROPLASTY;  Surgeon: Elsie Saas, MD;  Location: [place];  Service: Orthopedics;  Laterality: Right;   TRANSFORAMINAL LUMBAR INTERBODY FUSION (TLIF) WITH PEDICLE SCREW FIXATION 1 LEVEL Left 08/16/2020   Procedure: Left Lumbar two-three Transforaminal lumbar interbody fusion with exploration and extension of adjacent level fusion;  Surgeon: Erline Levine, MD;  Location: [place];  Service: Neurosurgery;  Laterality: Left;   Patient Active Problem List   Diagnosis Date Noted   Degenerative lumbar spinal stenosis 08/16/2020   Acute blood loss as cause of postoperative anemia 04/03/2016   Class 3 obesity due to excess calories with body mass index (BMI) of 40.0 to 44.9 in adult    Primary localized  osteoarthritis of right knee    CKD (chronic kidney disease), stage III ([place]) 10/22/2015   Syncope 10/21/2015   Non-insulin dependent type 2 diabetes mellitus ([place]) 10/21/2015   Hypothyroidism 10/21/2015   Lumbar stenosis with neurogenic claudication 08/16/2014   Right rotator cuff tear    Anxiety    PONV (postoperative nausea and vomiting)    GERD (gastroesophageal reflux disease)    Arthritis    Thyroid disease    DIVERTICULOSIS-COLON 07/04/2009   Iron deficiency anemia 03/17/2009   PERSONAL HX COLONIC POLYPS 03/17/2009   SKIN RASH 02/23/2009   DIARRHEA 02/23/2009   FEVER, HX OF 02/23/2009   THYROID NODULE, LEFT 02/11/2009   Glaucoma 02/11/2009   Coronary atherosclerosis 02/11/2009   WOLFF (WOLFE)-PARKINSON-WHITE (WPW) SYNDROME 02/11/2009   DEGENERATIVE JOINT DISEASE, ANKLE 02/11/2009   Osteoarthritis of spine 02/11/2009   Essential hypertension 02/09/2009    REFERRING DIAG: Rehab postoperative lumbar fusion with instrumentation Unstable knee and ankle Knee replacement inside and back weakness   THERAPY DIAG:  Pain, lumbar region  Muscle weakness (generalized)  Unsteadiness on feet  Chronic pain of right knee  Difficulty in walking, not elsewhere classified  PERTINENT HISTORY: R TKA 2018  PRECAUTIONS: N/A  SUBJECTIVE:  Pt states she is doing much better. She is able to walk longer. She will still have stiffness with walking but no longer has pain. Pt states recent cold weather has really made her stiff.   PAIN:  Are you having pain? No stiffness VAS scale: 0/10 Pain location: Lower back Pain orientation: Left  PAIN TYPE: aching and dull Pain description: constant     OBJECTIVE:   PATIENT SURVEYS:  Modified Oswestry 24 / 50 or 48 % (previous)  Oswestry Score: 12 / 50 or 24%-  TODAY  POSTURE:  Decreased lumbar lordosis, T/S kyphosis   LE MMT:   MMT Right 02/02/2021 Left 02/02/2021  Hip flexion 4-5 4/5  Hip extension 4/5 4/5  Hip abduction 4/5  4/5  Hip adduction 4/5 4/5  Hip internal rotation 4/5 4/5  Hip external rotation 4/5 4/5   (Blank rows = not tested)     Palpation ASSESSMENT:  TTP and hypertonicity of bilat lumbar paraspinals; bilat QL hypertonicity   FUNCTIONAL TESTS:  5 times sit to stand: 8.6s  TUG: 11.5s   GAIT: Distance walked: 51f Assistive device utilized: None Level of assistance: SBA Comments: antalgic, shuffling gait Stairs: bilat UE support, step to pattern    TODAY'S TREATMENT:  11/17  Exercises Hooklying Single Knee to Chest Stretch with Towel - 2  x daily - 7 x weekly - 1 sets - 10 reps - 5 hold Neutral Lumbar Spine Curl Up - 2 x daily - 7 x weekly - 1 sets - 10 reps - 2 hold LTR 3s 10x Bridging 5x (on hold, painful)  Review of aquatic and land HEP  STM:  L QL and L/S para  03/06/21 Pt seen for aquatic therapy today.  Treatment took place in water 3.25-4 ft in depth at the [facility] pool. Temp of water was 94.  Pt entered/exited the pool via stairs (step to pattern) with bilat rail.    Warm up: waist deep fwd and back, sidestepping 4-6x each.     Standing Standing lumbar flexion stretch 10s 5x, double UE holding onto pool edge Standing QL/ pilates stretch at pool bench 30s 2x Walking 1x pool length between stretch for recovery  Exercises: walking 1x pool length between sets as need for active recovery -standing minisquat with 5lb ankle weight 2x10 -flutter kicking sitting on 3 water step from hip 3 x 20 -Standing board press and row 2x10 - SL balance with double UE yellow DB support 30s each  *Session stopped early due another pt appt time*   Pt requires buoyancy for support and to offload joints with strengthening exercises. Viscosity of the water is needed for resistance of strengthening; water current perturbations provides challenge to standing balance unsupported, requiring increased core activation.   03/02/21 Pt seen for aquatic therapy today.  Treatment took  place in water 3.25-4 ft in depth at the [facility] pool. Temp of water was 91.  Pt entered/exited the pool via stairs (step to pattern) with bilat rail. Edu given about ascend and descend   Warm up: waist deep fwd and back, sidestepping 4-6x each.     Standing Stretching: Gentle lb stretching pike position on steps 3 x 20 sec hold. Hip hiking R/L Hamstring and gastroc stretch pike position. yoga side stretching position (Vasisthasana modified) into right side bending/open left side to decrease middle thoracic ms tightness x 3 hold for 30 sec at initiation of treatment and completed again at end with some deep pressure/massage to trigger point  Exercises: -flutter kicking sitting on 3 water step from hip 3 x 20 -add/abd 3 x 20 UE with 1 foam buoy standing: -horizontal abd x 10; shoulder flex/ext x 10; add/abd x10.  Added shoulder retraction x10 -pilates supported by water bench long leg kicks x10  Forward and backward water walking x 2 widths between exercises for stretching and recovery 4 widths holding submerged 2 foam hand buoys.  Pt requires buoyancy for support and to offload joints with strengthening exercises. Viscosity of the water is needed for resistance of strengthening; water current perturbations provides challenge to standing balance unsupported, requiring increased core activation.       PATIENT EDUCATION:  Education details: stair sequencing and review, aquatic properties, anatomy, exercise progression, DOMS expectations, muscle firing,  envelope of function, HEP   Person educated: Patient Education method: Explanation, Demonstration, Tactile cues, Verbal cues, and Handouts Education comprehension: verbalized understanding, returned demonstration, verbal cues required, and tactile cues required     HOME EXERCISE PROGRAM: Access Code: [email or web address] land and aquatic code URL: [email or web address]   ASSESSMENT:   CLINICAL IMPRESSION: Pt does  demonstrate clinically signficant improvement with objective measures as well pt reported outcomes, as demo by [facility]. Pt with improved functional mobility and gait speed. However, pt continues to have lumbopelvic strength and LE strength deficits that make stairs and  longer distance (unsupported) mobility challenging. Pt's pain is much better control. Pt's strength and endurance are more priority at this time. Plan to alternate land and pool visits in order to begin transition to land based strength training. Pt would benefit from continued skilled therapy in order to reach goals and maximize functional lumbopelvic strength and mobility for prevention of further functional decline.       REHAB POTENTIAL: Fair     CLINICAL DECISION MAKING: Evolving/moderate complexity   EVALUATION COMPLEXITY: Moderate     GOALS:   SHORT TERM GOALS:   STG Name Target Date Goal status  1 Pt will become independent with HEP in order to demonstrate synthesis of PT education.   :  02/16/2021 MET  2 Pt will demonstrate at least a 12.8 improvement in Oswestry Index in order to demonstrate a clinically significant change in LBP and function.   :  03/02/2021 INITIAL  3 Pt will be able to demonstrate/report ability to sit/stand/sleep for extended periods of time without pain in order to demonstrate functional improvement and tolerance to static positioning.   : 03/02/2021 INITIAL Goal met 02/23/2021  4 Pt will be able to demonstrate/report ability to walk >5 mins without pain in order to demonstrate functional improvement and tolerance to exercise and community mobility.   : 03/02/2021 MET (walking with cane)    LONG TERM GOALS:    LTG Name Target Date Goal status  1 Pt  will become independent with final HEP in order to demonstrate synthesis of PT education.   : 03/30/2021 Ongoing  2 Pt will be able to perform 5XSTS in under 12s  in order to demonstrate functional improvement above the cut off score for  adults.   : 03/30/2021 MET  3 Pt will demonstrate at least a 25 improvement in Oswestry Index in order to demonstrate a clinically significant change in LBP and function.  : 03/30/2021 Partially MET  4 Pt will be able to demonstrate TUG in under 10 sec in order to demonstrate functional improvement in LE function, strength, balance, and mobility for safety with community ambulation.   03/30/2021 Partially MET    PLAN: PT FREQUENCY: 2x/week   PT DURATION: 8 weeks   PLANNED INTERVENTIONS: Therapeutic exercises, Therapeutic activity, Neuro Muscular re-education, Balance training, Gait training, Patient/Family education, Joint mobilization, Stair training, Aquatic Therapy, Dry Needling, Electrical stimulation, Spinal mobilization, Cryotherapy, Moist heat, scar mobilization, Taping, Vasopneumatic device, Traction, Ultrasound, Ionotophoresis 19m/ml Dexamethasone, and Manual therapy   PLAN FOR NEXT SESSION: progress SL stability/balance; LE and core strength   ADaleen BoPT, DPT 03/16/21 10:54 AM

## 2021-03-20 ENCOUNTER — Other Ambulatory Visit: Payer: Self-pay

## 2021-03-20 ENCOUNTER — Ambulatory Visit (HOSPITAL_BASED_OUTPATIENT_CLINIC_OR_DEPARTMENT_OTHER): Payer: Medicare Other | Admitting: Physical Therapy

## 2021-03-20 ENCOUNTER — Encounter (HOSPITAL_BASED_OUTPATIENT_CLINIC_OR_DEPARTMENT_OTHER): Payer: Self-pay | Admitting: Physical Therapy

## 2021-03-20 DIAGNOSIS — M545 Low back pain, unspecified: Secondary | ICD-10-CM

## 2021-03-20 DIAGNOSIS — M6281 Muscle weakness (generalized): Secondary | ICD-10-CM

## 2021-03-20 DIAGNOSIS — R293 Abnormal posture: Secondary | ICD-10-CM | POA: Diagnosis not present

## 2021-03-20 DIAGNOSIS — R262 Difficulty in walking, not elsewhere classified: Secondary | ICD-10-CM

## 2021-03-20 NOTE — Therapy (Addendum)
OUTPATIENT PHYSICAL THERAPY PROGRESS NOTE and D/C  PHYSICAL THERAPY DISCHARGE SUMMARY  Visits from Start of Care: 11   Plan: Patient agrees to discharge.  Patient goals were mostly met. Patient is being discharged due to meeting most rehab goals and not returning to therapy.       Patient Name: [name] MRN: [id number] DOB:02/19/1946, 75 y.o., female Today's Date: 03/20/2021  PCP: Leanna Battles, MD REFERRING PROVIDER: Leanna Battles, MD   PT End of Session - 03/20/21 1016     Visit Number 11    Number of Visits 19    Date for PT Re-Evaluation 05/03/21    Authorization Type [employer] Medicare    PT Start Time 1016    PT Stop Time 1045    PT Time Calculation (min) 29 min    Equipment Utilized During Treatment Other (comment)    Activity Tolerance Patient tolerated treatment well;Patient limited by pain;Patient limited by fatigue    Behavior During Therapy [facility] for tasks assessed/performed             Past Medical History:  Diagnosis Date   Acute blood loss as cause of postoperative anemia 04/03/2016   Anemia    hx of   Anxiety    takes Xanax daily as needed   Arthritis    Bronchitis    a month ago   Chronic back pain    stenosis   Chronic kidney disease    stage 3-sees Dr.J Patel   Class 3 obesity due to excess calories with body mass index (BMI) of 40.0 to 44.9 in adult    Diabetes mellitus    type II; takes Byetta and Invokana daily   Glaucoma    uses Eye Drops   Gout    takes Uloric daily   History of blood transfusion    no abnormal reaction    History of colon polyps    Hyperlipidemia    no meds taken now   Hypertension    takes HCTZ daily as well as Aldactone   Hypothyroidism    takes Synthroid daily   Pneumonia    many yrs ago   PONV (postoperative nausea and vomiting)    Primary localized osteoarthritis of right knee    Stress incontinence    Syncope    Weakness    numbness and tingling in right hip/eg/   Wears glasses     Wolf-Parkinson-White syndrome    takes Atenolol daily   Past Surgical History:  Procedure Laterality Date   ABDOMINAL HYSTERECTOMY     APPENDECTOMY     BACK SURGERY  2006   lumb fusion   BACK SURGERY  08/16/2014   L3-4 fusion   BREAST BIOPSY     BREAST ENHANCEMENT SURGERY Bilateral    BREAST EXCISIONAL BIOPSY Left    benign   CARDIAC CATHETERIZATION  >40yr ago   CARPAL TUNNEL RELEASE Bilateral    x4   cataract surgery Bilateral    CESAREAN SECTION     x 3   CHOLECYSTECTOMY  1981   COLONOSCOPY     KNEE ARTHROSCOPY Right 2009   rt   RADIOLOGY WITH ANESTHESIA N/A 05/12/2020   Procedure: MRI LUMBAR SPINE WITH AND WITHOUT CONTRAST;  Surgeon: Radiologist, Medication, MD;  Location: [place]  Service: Radiology;  Laterality: N/A;   RAZ procedure  10+yrs ago   REDUCTION MAMMAPLASTY Bilateral 2006   ROTATOR CUFF REPAIR Right    x 4   SHOULDER ARTHROSCOPY WITH ROTATOR CUFF  REPAIR AND SUBACROMIAL DECOMPRESSION Right 06/10/2012   Procedure: SHOULDER ARTHROSCOPY WITH ROTATOR CUFF REPAIR AND SUBACROMIAL DECOMPRESSION;  Surgeon: Lorn Junes, MD;  Location: [place];  Service: Orthopedics;  Laterality: Right;  RIGHT SHOULDER ARTHROSCOPY, DECOMPRESSION SUBACROMIAL PARTIAL ACROMIOPLASTY WITH CORACOACROMIAL RELEASE,, DISTAL CLAVICULECTOMY , ROTATOR CUFF debriedment   SKIN BIOPSY     removed moles all over body   THYROID SURGERY Right    right thyroid removed   TOTAL KNEE ARTHROPLASTY Right 04/02/2016   Procedure: RIGHT TOTAL KNEE ARTHROPLASTY;  Surgeon: Elsie Saas, MD;  Location: [place];  Service: Orthopedics;  Laterality: Right;   TRANSFORAMINAL LUMBAR INTERBODY FUSION (TLIF) WITH PEDICLE SCREW FIXATION 1 LEVEL Left 08/16/2020   Procedure: Left Lumbar two-three Transforaminal lumbar interbody fusion with exploration and extension of adjacent level fusion;  Surgeon: Erline Levine, MD;  Location: [place];  Service: Neurosurgery;  Laterality: Left;   Patient Active Problem List    Diagnosis Date Noted   Degenerative lumbar spinal stenosis 08/16/2020   Acute blood loss as cause of postoperative anemia 04/03/2016   Class 3 obesity due to excess calories with body mass index (BMI) of 40.0 to 44.9 in adult    Primary localized osteoarthritis of right knee    CKD (chronic kidney disease), stage III ([place]) 10/22/2015   Syncope 10/21/2015   Non-insulin dependent type 2 diabetes mellitus ([place]) 10/21/2015   Hypothyroidism 10/21/2015   Lumbar stenosis with neurogenic claudication 08/16/2014   Right rotator cuff tear    Anxiety    PONV (postoperative nausea and vomiting)    GERD (gastroesophageal reflux disease)    Arthritis    Thyroid disease    DIVERTICULOSIS-COLON 07/04/2009   Iron deficiency anemia 03/17/2009   PERSONAL HX COLONIC POLYPS 03/17/2009   SKIN RASH 02/23/2009   DIARRHEA 02/23/2009   FEVER, HX OF 02/23/2009   THYROID NODULE, LEFT 02/11/2009   Glaucoma 02/11/2009   Coronary atherosclerosis 02/11/2009   WOLFF (WOLFE)-PARKINSON-WHITE (WPW) SYNDROME 02/11/2009   DEGENERATIVE JOINT DISEASE, ANKLE 02/11/2009   Osteoarthritis of spine 02/11/2009   Essential hypertension 02/09/2009    REFERRING DIAG: Rehab postoperative lumbar fusion with instrumentation Unstable knee and ankle Knee replacement inside and back weakness   THERAPY DIAG:  Pain, lumbar region  Muscle weakness (generalized)  Difficulty in walking, not elsewhere classified  PERTINENT HISTORY: R TKA 2018  PRECAUTIONS: N/A  SUBJECTIVE:  Pt states she is stiff this morning from the cold. She reports no issues with HEP.  Pt states she is "about as good as I am going to be."  PAIN:  Are you having pain? No stiffness VAS scale: 0/10 Pain location: Lower back Pain orientation: Left  PAIN TYPE: aching and dull Pain description: constant     OBJECTIVE:      TODAY'S TREATMENT: 11/21  Exercises Hooklying Single Knee to Chest Stretch with Towel - 2 x daily - 7 x weekly - 1 sets - 10  reps - 5 hold Neutral Lumbar Spine Curl Up - 2 x daily - 7 x weekly - 1 sets - 10 reps - 2 hold LTR 3s 10x Bridging 5x (on hold, painful)  Review of aquatic and land HEP Self massage tennis ball QL stretch 30s 2x each Paloff press double RTB 2x10 each direction STM:  L QL and L/S para  11/17  Exercises Hooklying Single Knee to Chest Stretch with Towel - 2 x daily - 7 x weekly - 1 sets - 10 reps - 5 hold Neutral Lumbar Spine  Curl Up - 2 x daily - 7 x weekly - 1 sets - 10 reps - 2 hold LTR 3s 10x Bridging 5x (on hold, painful)  Review of aquatic and land HEP   STM:  L QL and L/S para  03/06/21 Pt seen for aquatic therapy today.  Treatment took place in water 3.25-4 ft in depth at the [facility] pool. Temp of water was 94.  Pt entered/exited the pool via stairs (step to pattern) with bilat rail.    Warm up: waist deep fwd and back, sidestepping 4-6x each.     Standing Standing lumbar flexion stretch 10s 5x, double UE holding onto pool edge Standing QL/ pilates stretch at pool bench 30s 2x Walking 1x pool length between stretch for recovery  Exercises: walking 1x pool length between sets as need for active recovery -standing minisquat with 5lb ankle weight 2x10 -flutter kicking sitting on 3 water step from hip 3 x 20 -Standing board press and row 2x10 - SL balance with double UE yellow DB support 30s each  *Session stopped early due another pt appt time*   Pt requires buoyancy for support and to offload joints with strengthening exercises. Viscosity of the water is needed for resistance of strengthening; water current perturbations provides challenge to standing balance unsupported, requiring increased core activation.   03/02/21 Pt seen for aquatic therapy today.  Treatment took place in water 3.25-4 ft in depth at the [facility] pool. Temp of water was 91.  Pt entered/exited the pool via stairs (step to pattern) with bilat rail. Edu given about ascend and  descend   Warm up: waist deep fwd and back, sidestepping 4-6x each.     Standing Stretching: Gentle lb stretching pike position on steps 3 x 20 sec hold. Hip hiking R/L Hamstring and gastroc stretch pike position. yoga side stretching position (Vasisthasana modified) into right side bending/open left side to decrease middle thoracic ms tightness x 3 hold for 30 sec at initiation of treatment and completed again at end with some deep pressure/massage to trigger point  Exercises: -flutter kicking sitting on 3 water step from hip 3 x 20 -add/abd 3 x 20 UE with 1 foam buoy standing: -horizontal abd x 10; shoulder flex/ext x 10; add/abd x10.  Added shoulder retraction x10 -pilates supported by water bench long leg kicks x10  Forward and backward water walking x 2 widths between exercises for stretching and recovery 4 widths holding submerged 2 foam hand buoys.  Pt requires buoyancy for support and to offload joints with strengthening exercises. Viscosity of the water is needed for resistance of strengthening; water current perturbations provides challenge to standing balance unsupported, requiring increased core activation.       PATIENT EDUCATION:  Education details: stair sequencing and review, aquatic properties, anatomy, exercise progression, DOMS expectations, muscle firing,  envelope of function, HEP   Person educated: Patient Education method: Explanation, Demonstration, Tactile cues, Verbal cues, and Handouts Education comprehension: verbalized understanding, returned demonstration, verbal cues required, and tactile cues required     HOME EXERCISE PROGRAM: Access Code: [email or web address] land and aquatic code URL: [email or web address]   ASSESSMENT:   CLINICAL IMPRESSION: Pt with improvement in stiffness after today's manual and exercise. Pt requests to hold therapy at this time due to feeling like being at functional plateau. Pt given updated HEP for lumbopelvic  strength and reviewed aquatic exercises for independent strength training. Pt to schedule PRN within next 30 days. Otherwise, plan to D/C case. Pt would benefit from  continued skilled therapy in order to reach goals and maximize functional lumbopelvic strength and mobility for prevention of further functional decline.       REHAB POTENTIAL: Fair     CLINICAL DECISION MAKING: Evolving/moderate complexity   EVALUATION COMPLEXITY: Moderate     GOALS:   SHORT TERM GOALS:   STG Name Target Date Goal status  1 Pt will become independent with HEP in order to demonstrate synthesis of PT education.   :  02/16/2021 MET  2 Pt will demonstrate at least a 12.8 improvement in Oswestry Index in order to demonstrate a clinically significant change in LBP and function.   :  03/02/2021 INITIAL  3 Pt will be able to demonstrate/report ability to sit/stand/sleep for extended periods of time without pain in order to demonstrate functional improvement and tolerance to static positioning.   : 03/02/2021 INITIAL Goal met 02/23/2021  4 Pt will be able to demonstrate/report ability to walk >5 mins without pain in order to demonstrate functional improvement and tolerance to exercise and community mobility.   : 03/02/2021 MET (walking with cane)    LONG TERM GOALS:    LTG Name Target Date Goal status  1 Pt  will become independent with final HEP in order to demonstrate synthesis of PT education.   : 03/30/2021 Ongoing  2 Pt will be able to perform 5XSTS in under 12s  in order to demonstrate functional improvement above the cut off score for adults.   : 03/30/2021 MET  3 Pt will demonstrate at least a 25 improvement in Oswestry Index in order to demonstrate a clinically significant change in LBP and function.  : 03/30/2021 Partially MET  4 Pt will be able to demonstrate TUG in under 10 sec in order to demonstrate functional improvement in LE function, strength, balance, and mobility for safety with community  ambulation.   03/30/2021 Partially MET    PLAN: PT FREQUENCY: 2x/week   PT DURATION: 8 weeks   PLANNED INTERVENTIONS: Therapeutic exercises, Therapeutic activity, Neuro Muscular re-education, Balance training, Gait training, Patient/Family education, Joint mobilization, Stair training, Aquatic Therapy, Dry Needling, Electrical stimulation, Spinal mobilization, Cryotherapy, Moist heat, scar mobilization, Taping, Vasopneumatic device, Traction, Ultrasound, Ionotophoresis 52m/ml Dexamethasone, and Manual therapy   PLAN FOR NEXT SESSION: progress SL stability/balance; LE and core strength   ADaleen BoPT, DPT 03/20/21 10:52 AM

## 2021-03-21 ENCOUNTER — Ambulatory Visit (HOSPITAL_BASED_OUTPATIENT_CLINIC_OR_DEPARTMENT_OTHER): Payer: Medicare Other | Admitting: Physical Therapy

## 2021-03-28 ENCOUNTER — Ambulatory Visit (HOSPITAL_BASED_OUTPATIENT_CLINIC_OR_DEPARTMENT_OTHER): Payer: Medicare Other | Admitting: Physical Therapy

## 2021-03-30 ENCOUNTER — Ambulatory Visit (HOSPITAL_BASED_OUTPATIENT_CLINIC_OR_DEPARTMENT_OTHER): Payer: Medicare Other | Admitting: Physical Therapy

## 2021-04-03 ENCOUNTER — Encounter (HOSPITAL_BASED_OUTPATIENT_CLINIC_OR_DEPARTMENT_OTHER): Payer: Medicare Other | Admitting: Physical Therapy

## 2021-04-10 ENCOUNTER — Encounter (HOSPITAL_BASED_OUTPATIENT_CLINIC_OR_DEPARTMENT_OTHER): Payer: Medicare Other | Admitting: Physical Therapy

## 2021-04-13 ENCOUNTER — Ambulatory Visit
Admission: RE | Admit: 2021-04-13 | Discharge: 2021-04-13 | Disposition: A | Discharge status: Home / Self Care | Payer: Medicare Other | Source: Ambulatory Visit | Attending: Internal Medicine | Admitting: Internal Medicine

## 2021-04-13 DIAGNOSIS — Z1231 Encounter for screening mammogram for malignant neoplasm of breast: Secondary | ICD-10-CM

## 2021-04-28 NOTE — Addendum Note (Signed)
Addended by: Daleen Bo on: 04/28/2021 10:16 AM   Modules accepted: Orders

## 2021-04-28 NOTE — Addendum Note (Signed)
Addended by: Daleen Bo on: 04/28/2021 10:54 AM   Modules accepted: Orders

## 2021-05-18 ENCOUNTER — Other Ambulatory Visit (HOSPITAL_COMMUNITY): Payer: Self-pay

## 2021-05-18 MED ORDER — MOUNJARO 10 MG/0.5ML ~~LOC~~ SOAJ
SUBCUTANEOUS | 1 refills | Status: DC
  Filled 2021-05-18: qty 2, 28d supply, fill #0
  Filled 2021-06-05: qty 2, 28d supply, fill #1

## 2021-06-05 ENCOUNTER — Other Ambulatory Visit (HOSPITAL_COMMUNITY): Payer: Self-pay

## 2021-06-06 ENCOUNTER — Other Ambulatory Visit (HOSPITAL_COMMUNITY): Payer: Self-pay

## 2021-06-08 ENCOUNTER — Other Ambulatory Visit (HOSPITAL_COMMUNITY): Payer: Self-pay

## 2021-06-12 ENCOUNTER — Other Ambulatory Visit (HOSPITAL_COMMUNITY): Payer: Self-pay

## 2021-06-13 ENCOUNTER — Other Ambulatory Visit: Payer: Self-pay

## 2021-06-13 ENCOUNTER — Other Ambulatory Visit (HOSPITAL_COMMUNITY): Payer: Self-pay

## 2021-06-13 ENCOUNTER — Ambulatory Visit (INDEPENDENT_AMBULATORY_CARE_PROVIDER_SITE_OTHER): Payer: Medicare Other | Admitting: Podiatry

## 2021-06-13 DIAGNOSIS — E119 Type 2 diabetes mellitus without complications: Secondary | ICD-10-CM | POA: Diagnosis not present

## 2021-06-13 DIAGNOSIS — L600 Ingrowing nail: Secondary | ICD-10-CM

## 2021-06-13 MED ORDER — MOUNJARO 12.5 MG/0.5ML ~~LOC~~ SOAJ
SUBCUTANEOUS | 2 refills | Status: AC
  Filled 2021-06-13: qty 2, 30d supply, fill #0
  Filled 2021-07-06: qty 2, 30d supply, fill #1
  Filled 2021-08-02: qty 2, 28d supply, fill #2

## 2021-06-13 MED ORDER — CICLOPIROX 8 % EX SOLN
Freq: Every day | CUTANEOUS | 2 refills | Status: DC
  Filled 2021-06-13: qty 6.6, 30d supply, fill #0

## 2021-06-18 NOTE — Progress Notes (Signed)
°  Subjective:  Patient ID: [name], female    DOB: [birth year],  MRN: [id number]  Chief Complaint  Patient presents with   Nail Problem       R great nail check, [facility]    Diabetes    Diabetic foot exam    76 y.o. female returns for follow-up of nail with the above complaint. History confirmed with patient.  Still having a few issues with the right great toenail.  She is here again for her annual diabetic foot exam.  Objective:  Physical Exam: warm, good capillary refill, no trophic changes or ulcerative lesions, normal DP and PT pulses and normal sensory exam.  Prior matricectomy sites have healed well.  There is some rough skin on the right side.  Good sensation with monofilament.  She does have some onychomycosis of the right hallux nail   Assessment:   1. Ingrowing left great toenail   2. Ingrowing right great toenail   3. Encounter for diabetic foot exam [facility])      Plan:  Patient was evaluated and treated and all questions answered.    Patient educated on diabetes. Discussed proper diabetic foot care and discussed risks and complications of disease. Educated patient in depth on reasons to return to the office immediately should he/she discover anything concerning or new on the feet. All questions answered. Discussed proper shoes as well.   Rx for Penlac sent and I reviewed its use.  This is for the right hallux nail primarily.   Return if symptoms worsen or fail to improve.

## 2021-07-06 ENCOUNTER — Other Ambulatory Visit (HOSPITAL_COMMUNITY): Payer: Self-pay

## 2021-07-09 NOTE — Progress Notes (Signed)
Cardiology Office Note:    Date:  07/10/2021   ID:  [name], DOB 11-26-1945, MRN [id number]  PCP:  Donnajean Lopes, MD  Cardiologist:  Sinclair Grooms, MD   Referring MD: Donnajean Lopes, MD   Chief Complaint  Patient presents with   Coronary Artery Disease   Hypertension    History of Present Illness:    [name] is a 76 y.o. female with a hx of near syncope, atypical chest pain, WPW (asymptomatic), controlled type 2 diabetes mellitus II, and palpitations.   No cardiac complaints.  She has had 3 lumbar disc surgical procedures over time.  The most recent surgery was L2-L3.  She is in rehab now for that.  No cardiac complications.  She denies orthopnea, PND, anginal quality chest pain, peripheral edema, claudication, palpitations, and syncope.  Past Medical History:  Diagnosis Date   Acute blood loss as cause of postoperative anemia 04/03/2016   Anemia    hx of   Anxiety    takes Xanax daily as needed   Arthritis    Bronchitis    a month ago   Chronic back pain    stenosis   Chronic kidney disease    stage 3-sees Dr.J Patel   Class 3 obesity due to excess calories with body mass index (BMI) of 40.0 to 44.9 in adult    Diabetes mellitus    type II; takes Byetta and Invokana daily   Glaucoma    uses Eye Drops   Gout    takes Uloric daily   History of blood transfusion    no abnormal reaction    History of colon polyps    Hyperlipidemia    no meds taken now   Hypertension    takes HCTZ daily as well as Aldactone   Hypothyroidism    takes Synthroid daily   Pneumonia    many yrs ago   PONV (postoperative nausea and vomiting)    Primary localized osteoarthritis of right knee    Stress incontinence    Syncope    Weakness    numbness and tingling in right hip/eg/   Wears glasses    Wolf-Parkinson-White syndrome    takes Atenolol daily    Past Surgical History:  Procedure Laterality Date   ABDOMINAL HYSTERECTOMY     APPENDECTOMY      BACK SURGERY  2006   lumb fusion   BACK SURGERY  08/16/2014   L3-4 fusion   BREAST BIOPSY     BREAST ENHANCEMENT SURGERY Bilateral    BREAST EXCISIONAL BIOPSY Left    benign   CARDIAC CATHETERIZATION  >86yr ago   CARPAL TUNNEL RELEASE Bilateral    x4   cataract surgery Bilateral    CESAREAN SECTION     x 3   CHOLECYSTECTOMY  1981   COLONOSCOPY     KNEE ARTHROSCOPY Right 2009   rt   RADIOLOGY WITH ANESTHESIA N/A 05/12/2020   Procedure: MRI LUMBAR SPINE WITH AND WITHOUT CONTRAST;  Surgeon: Radiologist, Medication, MD;  [place] [facility]  Service: Radiology;  Laterality: N/A;   RAZ procedure  10+yrs ago   REDUCTION MAMMAPLASTY Bilateral 2006   ROTATOR CUFF REPAIR Right    x 4   SHOULDER ARTHROSCOPY WITH ROTATOR CUFF REPAIR AND SUBACROMIAL DECOMPRESSION Right 06/10/2012   Procedure: SHOULDER ARTHROSCOPY WITH ROTATOR CUFF REPAIR AND SUBACROMIAL DECOMPRESSION;  Surgeon: RLorn Junes MD;  [place] [facility]  Service: Orthopedics;  Laterality: Right;  RIGHT SHOULDER ARTHROSCOPY, DECOMPRESSION SUBACROMIAL PARTIAL ACROMIOPLASTY WITH CORACOACROMIAL RELEASE,, DISTAL CLAVICULECTOMY , ROTATOR CUFF debriedment   SKIN BIOPSY     removed moles all over body   THYROID SURGERY Right    right thyroid removed   TOTAL KNEE ARTHROPLASTY Right 04/02/2016   Procedure: RIGHT TOTAL KNEE ARTHROPLASTY;  Surgeon: Elsie Saas, MD;  Location: [facility];  Service: Orthopedics;  Laterality: Right;   TRANSFORAMINAL LUMBAR INTERBODY FUSION (TLIF) WITH PEDICLE SCREW FIXATION 1 LEVEL Left 08/16/2020   Procedure: Left Lumbar two-three Transforaminal lumbar interbody fusion with exploration and extension of adjacent level fusion;  Surgeon: Erline Levine, MD;  Location: [place];  Service: Neurosurgery;  Laterality: Left;    Current Medications: Current Meds  Medication Sig   ALPRAZolam (XANAX) 0.5 MG tablet Take 0.5 mg by mouth 3 (three) times daily as needed for anxiety.    aspirin 81 MG chewable tablet  Chew 81 mg by mouth daily.   atenolol (TENORMIN) 25 MG tablet Take 25 mg by mouth daily.   ciclopirox (PENLAC) 8 % solution Apply topically at bedtime. Apply over nail and surrounding skin. Apply daily over previous coat. After seven  days, remove with alcohol and continue cycle.   COVID-19 mRNA bivalent vaccine, Pfizer, (PFIZER COVID-19 VAC BIVALENT) injection Inject into the muscle.   dicyclomine (BENTYL) 10 MG capsule Take 1 capsule (10 mg total) by mouth 3 (three) times daily before meals.   febuxostat (ULORIC) 40 MG tablet Take 40 mg by mouth daily.    furosemide (LASIX) 20 MG tablet Take 20 mg by mouth daily.   HYDROmorphone (DILAUDID) 2 MG tablet Take 0.5-1 tablets (1-2 mg total) by mouth every 4 (four) hours as needed for severe pain.   latanoprost (XALATAN) 0.005 % ophthalmic solution Place 1 drop into both eyes at bedtime.   levothyroxine (SYNTHROID, LEVOTHROID) 50 MCG tablet Take 50-100 mcg by mouth See admin instructions. Take 50 mcg daily, except 100 mcg by mouth on Sunday   methocarbamol (ROBAXIN) 500 MG tablet Take 1 tablet (500 mg total) by mouth every 6 (six) hours as needed for muscle spasms.   Multiple Vitamins-Minerals (MULTIVITAMIN WITH MINERALS) tablet Take 1 tablet by mouth daily.   pravastatin (PRAVACHOL) 20 MG tablet Take 20 mg by mouth every evening.   telmisartan (MICARDIS) 40 MG tablet Take 40 mg by mouth daily.   tirzepatide [facility]) 12.5 MG/0.5ML Pen Inject 1 pen as directed into the skin once weekly   Current Facility-Administered Medications for the 07/10/21 encounter (Office Visit) with Belva Crome, MD  Medication   0.9 %  sodium chloride infusion     Allergies:   Contrast media [iodinated contrast media], Adhesive [tape], Esomeprazole magnesium, Morphine and related, Talwin [pentazocine], Acetaminophen, Ceftriaxone, Allopurinol, Codeine, Colchicine, Hydrocodone, Meperidine hcl, Oxycodone, and Pentazocine lactate   Social History   Socioeconomic History    Marital status: Single    Spouse name: Not on file   Number of children: Not on file   Years of education: Not on file   Highest education level: Not on file  Occupational History   Not on file  Tobacco Use   Smoking status: Former    Packs/day: 0.50    Years: 15.00    Pack years: 7.50    Types: Cigarettes    Quit date: 83    Years since quitting: 45.2   Smokeless tobacco: Never  Vaping Use   Vaping Use: Never used  Substance and Sexual Activity   Alcohol use: No  Drug use: No   Sexual activity: Not on file    Comment: Hysterectomy  Other Topics Concern   Not on file  Social History Narrative   Not on file   Social Determinants of Health   Financial Resource Strain: Not on file  Food Insecurity: Not on file  Transportation Needs: Not on file  Physical Activity: Not on file  Stress: Not on file  Social Connections: Not on file     Family History: The patient's family history includes Arthritis in an other family member; Diabetes in her mother; Heart attack in her father; Hypertension in an other family member. There is no history of Breast cancer, Colon cancer, Esophageal cancer, Rectal cancer, or Stomach cancer.  ROS:   Please see the history of present illness.    She is in rehab at [facility] well.  Still having some back pain but improved.  She is attentive to risk factors.  All other systems reviewed and are negative.  EKGs/Labs/Other Studies Reviewed:    The following studies were reviewed today:  CORONARY CTA 05/22/2019: IMPRESSION: 1. Coronary calcium score of 62. This was 28 percentile for age and sex matched control.   2. Normal coronary origin with right dominance.   3. Minimal (0-24%) calcified plaque in the proximal LAD and proximal and distal RCA; CADRADS-1.    EKG:  EKG normal sinus rhythm with normal appearance.  Prior EKGs remotely demonstrated delta wave with short PR interval consistent with WPW.  Recent Labs: 08/12/2020: BUN 21;  Creatinine, Ser 1.34; Hemoglobin 11.2; Platelets 275; Potassium 4.4; Sodium 139  Recent Lipid Panel No results found for: CHOL, TRIG, HDL, CHOLHDL, VLDL, LDLCALC, LDLDIRECT  Physical Exam:    VS:  BP 110/78    Pulse 75    Ht '5\' 3"'$  (1.6 m)    Wt 220 lb 12.8 oz (100.2 kg)    LMP  (LMP Unknown)    SpO2 98%    BMI 39.11 kg/m     Wt Readings from Last 3 Encounters:  07/10/21 220 lb 12.8 oz (100.2 kg)  01/26/21 242 lb (109.8 kg)  08/16/20 240 lb (108.9 kg)     GEN: Morbid obesity. No acute distress HEENT: Normal NECK: No JVD. LYMPHATICS: No lymphadenopathy CARDIAC: No murmur. RRR no gallop, or edema. VASCULAR:  Normal Pulses. No bruits. RESPIRATORY:  Clear to auscultation without rales, wheezing or rhonchi  ABDOMEN: Soft, non-tender, non-distended, No pulsatile mass, MUSCULOSKELETAL: No deformity  SKIN: Warm and dry NEUROLOGIC:  Alert and oriented x 3 PSYCHIATRIC:  Normal affect   ASSESSMENT:    1. Atherosclerosis of native coronary artery of native heart without angina pectoris   2. Essential hypertension   3. Hyperlipidemia LDL goal <70   4. WOLFF (WOLFE)-PARKINSON-WHITE (WPW) SYNDROME    PLAN:    In order of problems listed above:  Continue with preventive measures including tight blood pressure control, diabetes control, and lipid management as noted below. Continue Tenormin and Micardis. LDL target less than 70.  Coronary calcium score was 62.  The most recent LDL was 73.  This blood work was done February 2023. No EKG evidence of preexcitation.   Overall education and awareness concerning secondary risk prevention was discussed in detail: LDL less than 70, hemoglobin A1c less than 7, blood pressure target less than 130/80 mmHg, >150 minutes of moderate aerobic activity per week, avoidance of smoking, weight control (via diet and exercise), and continued surveillance/management of/for obstructive sleep apnea.    Medication Adjustments/Labs and  Tests Ordered: Current  medicines are reviewed at length with the patient today.  Concerns regarding medicines are outlined above.  Orders Placed This Encounter  Procedures   EKG 12-Lead   No orders of the defined types were placed in this encounter.   Patient Instructions  Medication Instructions:  Your physician recommends that you continue on your current medications as directed. Please refer to the Current Medication list given to you today.  *If you need a refill on your cardiac medications before your next appointment, please call your pharmacy*   Lab Work: None If you have labs (blood work) drawn today and your tests are completely normal, you will receive your results only by: [place] (if you have MyChart) OR A paper copy in the mail If you have any lab test that is abnormal or we need to change your treatment, we will call you to review the results.   Testing/Procedures: None   Follow-Up: At [facility], you and your health needs are our priority.  As part of our continuing mission to provide you with exceptional heart care, we have created designated Provider Care Teams.  These Care Teams include your primary Cardiologist (physician) and Advanced Practice Providers (APPs -  Physician Assistants and Nurse Practitioners) who all work together to provide you with the care you need, when you need it.  We recommend signing up for the patient portal called "MyChart".  Sign up information is provided on this After Visit Summary.  MyChart is used to connect with patients for Virtual Visits (Telemedicine).  Patients are able to view lab/test results, encounter notes, upcoming appointments, etc.  Non-urgent messages can be sent to your provider as well.   To learn more about what you can do with MyChart, go to [email or web address].    Your next appointment:   1 year(s)  The format for your next appointment:   In Person  Provider:   Sinclair Grooms, MD     Other Instructions      Signed, Sinclair Grooms, MD  07/10/2021 2:33 PM    [place]

## 2021-07-10 ENCOUNTER — Other Ambulatory Visit: Payer: Self-pay

## 2021-07-10 ENCOUNTER — Encounter: Payer: Self-pay | Admitting: Interventional Cardiology

## 2021-07-10 ENCOUNTER — Ambulatory Visit: Payer: Medicare Other | Admitting: Interventional Cardiology

## 2021-07-10 VITALS — BP 110/78 | HR 75 | Ht 63.0 in | Wt 220.8 lb

## 2021-07-10 DIAGNOSIS — I2510 Atherosclerotic heart disease of native coronary artery without angina pectoris: Secondary | ICD-10-CM

## 2021-07-10 DIAGNOSIS — E785 Hyperlipidemia, unspecified: Secondary | ICD-10-CM | POA: Diagnosis not present

## 2021-07-10 DIAGNOSIS — I456 Pre-excitation syndrome: Secondary | ICD-10-CM | POA: Diagnosis not present

## 2021-07-10 DIAGNOSIS — I10 Essential (primary) hypertension: Secondary | ICD-10-CM

## 2021-07-10 NOTE — Patient Instructions (Signed)

## 2021-08-02 ENCOUNTER — Other Ambulatory Visit (HOSPITAL_COMMUNITY): Payer: Self-pay

## 2021-08-07 ENCOUNTER — Other Ambulatory Visit (HOSPITAL_COMMUNITY): Payer: Self-pay

## 2021-08-30 ENCOUNTER — Ambulatory Visit (HOSPITAL_BASED_OUTPATIENT_CLINIC_OR_DEPARTMENT_OTHER): Payer: Medicare Other | Attending: Internal Medicine | Admitting: Physical Therapy

## 2021-08-30 ENCOUNTER — Encounter (HOSPITAL_BASED_OUTPATIENT_CLINIC_OR_DEPARTMENT_OTHER): Payer: Self-pay | Admitting: Physical Therapy

## 2021-08-30 DIAGNOSIS — M6281 Muscle weakness (generalized): Secondary | ICD-10-CM | POA: Insufficient documentation

## 2021-08-30 DIAGNOSIS — R2681 Unsteadiness on feet: Secondary | ICD-10-CM | POA: Insufficient documentation

## 2021-08-30 DIAGNOSIS — M5459 Other low back pain: Secondary | ICD-10-CM | POA: Insufficient documentation

## 2021-08-30 NOTE — Therapy (Signed)
?OUTPATIENT PHYSICAL THERAPY LOWER EXTREMITY EVALUATION ? ? ?Patient Name: [name] ?MRN: [id number] ?DOB:1946/04/05, 76 y.o., female ?Today's Date: 08/30/2021 ? ? PT End of Session - 08/30/21 1011   ? ? Visit Number 1   ? Number of Visits 20   ? Date for PT Re-Evaluation 11/08/21   ? Authorization Type [employer] [facility]   ? Progress Note Due on Visit 10   ? PT Start Time 0900   ? PT Stop Time 6063   ? PT Time Calculation (min) 45 min   ? Activity Tolerance Patient limited by fatigue;Patient limited by pain   ? Behavior During Therapy [facility] for tasks assessed/performed   ? ?  ?  ? ?  ? ? ?Past Medical History:  ?Diagnosis Date  ? Acute blood loss as cause of postoperative anemia 04/03/2016  ? Anemia   ? hx of  ? Anxiety   ? takes Xanax daily as needed  ? Arthritis   ? Bronchitis   ? a month ago  ? Chronic back pain   ? stenosis  ? Chronic kidney disease   ? stage 3-sees Dr.J Posey Pronto  ? Class 3 obesity due to excess calories with body mass index (BMI) of 40.0 to 44.9 in adult   ? Diabetes mellitus   ? type II; takes Byetta and Invokana daily  ? Glaucoma   ? uses Eye Drops  ? Gout   ? takes Uloric daily  ? History of blood transfusion   ? no abnormal reaction   ? History of colon polyps   ? Hyperlipidemia   ? no meds taken now  ? Hypertension   ? takes HCTZ daily as well as Aldactone  ? Hypothyroidism   ? takes Synthroid daily  ? Pneumonia   ? many yrs ago  ? PONV (postoperative nausea and vomiting)   ? Primary localized osteoarthritis of right knee   ? Stress incontinence   ? Syncope   ? Weakness   ? numbness and tingling in right hip/eg/  ? Wears glasses   ? Wolf-Parkinson-White syndrome   ? takes Atenolol daily  ? ?Past Surgical History:  ?Procedure Laterality Date  ? ABDOMINAL HYSTERECTOMY    ? APPENDECTOMY    ? BACK SURGERY  2006  ? lumb fusion  ? BACK SURGERY  08/16/2014  ? L3-4 fusion  ? BREAST BIOPSY    ? BREAST ENHANCEMENT SURGERY Bilateral   ? BREAST EXCISIONAL BIOPSY Left   ? benign  ? CARDIAC CATHETERIZATION  >68yr  ago  ? CARPAL TUNNEL RELEASE Bilateral   ? x4  ? cataract surgery Bilateral   ? CESAREAN SECTION    ? x 3  ? CHOLECYSTECTOMY  1981  ? COLONOSCOPY    ? KNEE ARTHROSCOPY Right 2009  ? rt  ? RADIOLOGY WITH ANESTHESIA N/A 05/12/2020  ? Procedure: MRI LUMBAR SPINE WITH AND WITHOUT CONTRAST;  Surgeon: Radiologist, Medication, MD;  [place] [facility]  Service: Radiology;  Laterality: N/A;  ? RAZ procedure  10+yrs ago  ? REDUCTION MAMMAPLASTY Bilateral 2006  ? ROTATOR CUFF REPAIR Right   ? x 4  ? SHOULDER ARTHROSCOPY WITH ROTATOR CUFF REPAIR AND SUBACROMIAL DECOMPRESSION Right 06/10/2012  ? Procedure: SHOULDER ARTHROSCOPY WITH ROTATOR CUFF REPAIR AND SUBACROMIAL DECOMPRESSION;  Surgeon: RLorn Junes MD;  [place] [facility]  Service: Orthopedics;  Laterality: Right;  RIGHT SHOULDER ARTHROSCOPY, DECOMPRESSION SUBACROMIAL PARTIAL ACROMIOPLASTY WITH CORACOACROMIAL RELEASE,, DISTAL CLAVICULECTOMY , ROTATOR CUFF debriedment  ? SKIN BIOPSY    ?  removed moles all over body  ? THYROID SURGERY Right   ? right thyroid removed  ? TOTAL KNEE ARTHROPLASTY Right 04/02/2016  ? Procedure: RIGHT TOTAL KNEE ARTHROPLASTY;  Surgeon: Elsie Saas, MD;  Location: [place];  Service: Orthopedics;  Laterality: Right;  ? TRANSFORAMINAL LUMBAR INTERBODY FUSION (TLIF) WITH PEDICLE SCREW FIXATION 1 LEVEL Left 08/16/2020  ? Procedure: Left Lumbar two-three Transforaminal lumbar interbody fusion with exploration and extension of adjacent level fusion;  Surgeon: Erline Levine, MD;  Location: [place];  Service: Neurosurgery;  Laterality: Left;  ? ?Patient Active Problem List  ? Diagnosis Date Noted  ? Degenerative lumbar spinal stenosis 08/16/2020  ? Acute blood loss as cause of postoperative anemia 04/03/2016  ? Class 3 obesity due to excess calories with body mass index (BMI) of 40.0 to 44.9 in adult   ? Primary localized osteoarthritis of right knee   ? CKD (chronic kidney disease), stage III ([place]) 10/22/2015  ? Syncope 10/21/2015  ?  Non-insulin dependent type 2 diabetes mellitus ([place]) 10/21/2015  ? Hypothyroidism 10/21/2015  ? Lumbar stenosis with neurogenic claudication 08/16/2014  ? Right rotator cuff tear   ? Anxiety   ? PONV (postoperative nausea and vomiting)   ? GERD (gastroesophageal reflux disease)   ? Arthritis   ? Thyroid disease   ? DIVERTICULOSIS-COLON 07/04/2009  ? Iron deficiency anemia 03/17/2009  ? PERSONAL HX COLONIC POLYPS 03/17/2009  ? SKIN RASH 02/23/2009  ? DIARRHEA 02/23/2009  ? FEVER, HX OF 02/23/2009  ? THYROID NODULE, LEFT 02/11/2009  ? Glaucoma 02/11/2009  ? Coronary atherosclerosis 02/11/2009  ? WOLFF (WOLFE)-PARKINSON-WHITE (WPW) SYNDROME 02/11/2009  ? DEGENERATIVE JOINT DISEASE, ANKLE 02/11/2009  ? Osteoarthritis of spine 02/11/2009  ? Essential hypertension 02/09/2009  ? ? ?PCP: Donnajean Lopes, MD ? ?REFERRING PROVIDER: Donnajean Lopes, MD ? ?REFERRING DIAG: R26.81 (ICD-10-CM) - Unsteadiness on feet ? ?THERAPY DIAG:  ?Unsteadiness on feet ? ?Muscle weakness (generalized) ? ?Other low back pain ? ?ONSET DATE: 4-5 months ago ? ?SUBJECTIVE:  ? ?SUBJECTIVE STATEMENT: ?Was doing well asfter last aquatic session completed but as It got cold I decreased how much moving I was doing. Increase in back pain and knee pain. Has lost 26lb recently.  Taking ms relaxers no pain pills ? ?PERTINENT HISTORY: ?R TKA 2018, Lumbar fusion all 5. Left ankle OA ? ?PAIN:  ?Are you having pain? Yes: NPRS scale: 4/10 ?Pain location: LB ?Pain description: dull aggrevating with upright position ?Aggravating factors: upright position/walking ?Relieving factors: slumping forward ?Right knee and left ankle 4/5 ?PRECAUTIONS: Back and Fall ? ?WEIGHT BEARING RESTRICTIONS No ? ?FALLS:  ?Has patient fallen in last 6 months? Yes. Number of falls 2 ? ?LIVING ENVIRONMENT: ?Lives with: lives alone ?Lives in: House/apartment ?Stairs: Yes; 2 steps to enter. Level home  ?Has following equipment at home: Single point cane ?OCCUPATION: retired ? ?PLOF:  Independent ? ?PATIENT GOALS stand in kitchen and cook, laundry, make my bed better, stand upright with less pain, be stronger ? ? ?OBJECTIVE:  ? ?PATIENT SURVEYS:  ?FOTO 31  goal 29 ? ?COGNITION: ? Overall cognitive status: Within functional limits for tasks assessed   ?  ?SENSATION: ?WFL ? ? ?POSTURE:  ?Forward flex , rounded shoulder ? ?PALPATION: ?Moderately sensitive to palpation throughout lumbar paraspinals ? ? ?Lumbar ROM ?Limited secondary to pain ?Rotation and side bending limited by 50% R>L ?Extension 0; flex 30 degrees ? ? ?LE MMT: ? ?MMT Right ?08/30/2021 Left ?08/30/2021  ?Hip flexion 3- 3  ?Hip extension  3- 3  ?Hip abduction    ?Hip adduction    ?Hip internal rotation    ?Hip external rotation    ?Knee flexion 3 4  ?Knee extension 4 5  ?Ankle dorsiflexion    ?Ankle plantarflexion    ?Ankle inversion    ?Ankle eversion    ? (Blank rows = not tested) ? ? ?FUNCTIONAL TESTS:  ?TUG 29 ?30s STS 2 ?6MWT: 392 ft ?Berg balance:18/56 (High fall risk) ? ?GAIT: ?Distance walked: 500 )to pool ?Assistive device utilized: none.  (Needs SPC for safety) ?Level of assistance: SBA ?Comments: Antalgic: cadence slowed, decreased step length, forward hip flex, dragging heels (decreased knee and hip flex during swing) ? ? ? ?TODAY'S TREATMENT: ?Evaluation ?Self care/HEP: use cane at all times rest as needed to avoid falls. Importance of movement for overall health/management of condition/pain control ?Objective measurements taken ? ? ?PATIENT EDUCATION:  ?Education details: see treatment ?Person educated: Patient ?Education method: Explanation ?Education comprehension: verbalized understanding ? ? ?HOME EXERCISE PROGRAM: ?[id number] from last episode ? ?ASSESSMENT: ? ?CLINICAL IMPRESSION: ?Patient is a 76 y.o. f who was seen today for physical therapy evaluation and treatment for unsteadiness on feet which is secondary to chronic lumbar spine and right knee issues.  Pt was seen here and DC ~oct-nov 2022.  At that time she was  regularly attending water aerobics classes at [facility] and pain for chronic conditions managed.   She reports not moving much through the winter months and is now very week, pain in lb increased, unable to [employer]

## 2021-09-01 ENCOUNTER — Encounter (HOSPITAL_BASED_OUTPATIENT_CLINIC_OR_DEPARTMENT_OTHER): Payer: Self-pay | Admitting: Physical Therapy

## 2021-09-01 ENCOUNTER — Ambulatory Visit (HOSPITAL_BASED_OUTPATIENT_CLINIC_OR_DEPARTMENT_OTHER): Payer: Medicare Other | Admitting: Physical Therapy

## 2021-09-01 DIAGNOSIS — M6281 Muscle weakness (generalized): Secondary | ICD-10-CM

## 2021-09-01 DIAGNOSIS — R2681 Unsteadiness on feet: Secondary | ICD-10-CM

## 2021-09-01 DIAGNOSIS — M5459 Other low back pain: Secondary | ICD-10-CM

## 2021-09-01 NOTE — Therapy (Signed)
?OUTPATIENT PHYSICAL THERAPY TREATMENT NOTE ? ? ?Patient Name: [name] ?MRN: [id number] ?DOB:21-Mar-1946, 76 y.o., female ?Today's Date: 09/01/2021 ? ? ? PT End of Session - 09/01/21 0934   ? ? Visit Number 2   ? Number of Visits 20   ? Date for PT Re-Evaluation 11/08/21   ? Authorization Type [employer] Mcr   ? Authorization - Visit Number 2   ? Progress Note Due on Visit 10   ? PT Start Time [id number]   ? PT Stop Time 1015   ? PT Time Calculation (min) 44 min   ? Behavior During Therapy [facility] for tasks assessed/performed   ? ?  ?  ? ?  ? ? ?Past Medical History:  ?Diagnosis Date  ? Acute blood loss as cause of postoperative anemia 04/03/2016  ? Anemia   ? hx of  ? Anxiety   ? takes Xanax daily as needed  ? Arthritis   ? Bronchitis   ? a month ago  ? Chronic back pain   ? stenosis  ? Chronic kidney disease   ? stage 3-sees Dr.J Posey Pronto  ? Class 3 obesity due to excess calories with body mass index (BMI) of 40.0 to 44.9 in adult   ? Diabetes mellitus   ? type II; takes Byetta and Invokana daily  ? Glaucoma   ? uses Eye Drops  ? Gout   ? takes Uloric daily  ? History of blood transfusion   ? no abnormal reaction   ? History of colon polyps   ? Hyperlipidemia   ? no meds taken now  ? Hypertension   ? takes HCTZ daily as well as Aldactone  ? Hypothyroidism   ? takes Synthroid daily  ? Pneumonia   ? many yrs ago  ? PONV (postoperative nausea and vomiting)   ? Primary localized osteoarthritis of right knee   ? Stress incontinence   ? Syncope   ? Weakness   ? numbness and tingling in right hip/eg/  ? Wears glasses   ? Wolf-Parkinson-White syndrome   ? takes Atenolol daily  ? ?Past Surgical History:  ?Procedure Laterality Date  ? ABDOMINAL HYSTERECTOMY    ? APPENDECTOMY    ? BACK SURGERY  2006  ? lumb fusion  ? BACK SURGERY  08/16/2014  ? L3-4 fusion  ? BREAST BIOPSY    ? BREAST ENHANCEMENT SURGERY Bilateral   ? BREAST EXCISIONAL BIOPSY Left   ? benign  ? CARDIAC CATHETERIZATION  >40yr ago  ? CARPAL TUNNEL RELEASE Bilateral   ? x4  ?  cataract surgery Bilateral   ? CESAREAN SECTION    ? x 3  ? CHOLECYSTECTOMY  1981  ? COLONOSCOPY    ? KNEE ARTHROSCOPY Right 2009  ? rt  ? RADIOLOGY WITH ANESTHESIA N/A 05/12/2020  ? Procedure: MRI LUMBAR SPINE WITH AND WITHOUT CONTRAST;  Surgeon: Radiologist, Medication, MD;  [place]  Service: Radiology;  Laterality: N/A;  ? RAZ procedure  10+yrs ago  ? REDUCTION MAMMAPLASTY Bilateral 2006  ? ROTATOR CUFF REPAIR Right   ? x 4  ? SHOULDER ARTHROSCOPY WITH ROTATOR CUFF REPAIR AND SUBACROMIAL DECOMPRESSION Right 06/10/2012  ? Procedure: SHOULDER ARTHROSCOPY WITH ROTATOR CUFF REPAIR AND SUBACROMIAL DECOMPRESSION;  Surgeon: RLorn Junes MD;  [place]  Service: Orthopedics;  Laterality: Right;  RIGHT SHOULDER ARTHROSCOPY, DECOMPRESSION SUBACROMIAL PARTIAL ACROMIOPLASTY WITH CORACOACROMIAL RELEASE,, DISTAL CLAVICULECTOMY , ROTATOR CUFF debriedment  ? SKIN BIOPSY    ? removed  moles all over body  ? THYROID SURGERY Right   ? right thyroid removed  ? TOTAL KNEE ARTHROPLASTY Right 04/02/2016  ? Procedure: RIGHT TOTAL KNEE ARTHROPLASTY;  Surgeon: Elsie Saas, MD;  Location: [place];  Service: Orthopedics;  Laterality: Right;  ? TRANSFORAMINAL LUMBAR INTERBODY FUSION (TLIF) WITH PEDICLE SCREW FIXATION 1 LEVEL Left 08/16/2020  ? Procedure: Left Lumbar two-three Transforaminal lumbar interbody fusion with exploration and extension of adjacent level fusion;  Surgeon: Erline Levine, MD;  Location: [place];  Service: Neurosurgery;  Laterality: Left;  ? ?Patient Active Problem List  ? Diagnosis Date Noted  ? Degenerative lumbar spinal stenosis 08/16/2020  ? Acute blood loss as cause of postoperative anemia 04/03/2016  ? Class 3 obesity due to excess calories with body mass index (BMI) of 40.0 to 44.9 in adult   ? Primary localized osteoarthritis of right knee   ? CKD (chronic kidney disease), stage III ([place]) 10/22/2015  ? Syncope 10/21/2015  ? Non-insulin dependent type 2 diabetes mellitus ([place])  10/21/2015  ? Hypothyroidism 10/21/2015  ? Lumbar stenosis with neurogenic claudication 08/16/2014  ? Right rotator cuff tear   ? Anxiety   ? PONV (postoperative nausea and vomiting)   ? GERD (gastroesophageal reflux disease)   ? Arthritis   ? Thyroid disease   ? DIVERTICULOSIS-COLON 07/04/2009  ? Iron deficiency anemia 03/17/2009  ? PERSONAL HX COLONIC POLYPS 03/17/2009  ? SKIN RASH 02/23/2009  ? DIARRHEA 02/23/2009  ? FEVER, HX OF 02/23/2009  ? THYROID NODULE, LEFT 02/11/2009  ? Glaucoma 02/11/2009  ? Coronary atherosclerosis 02/11/2009  ? WOLFF (WOLFE)-PARKINSON-WHITE (WPW) SYNDROME 02/11/2009  ? DEGENERATIVE JOINT DISEASE, ANKLE 02/11/2009  ? Osteoarthritis of spine 02/11/2009  ? Essential hypertension 02/09/2009  ? ?  ?PCP: Donnajean Lopes, MD ?  ?REFERRING PROVIDER: Donnajean Lopes, MD ?  ?REFERRING DIAG: R26.81 (ICD-10-CM) - Unsteadiness on feet ?  ?THERAPY DIAG:  ?Unsteadiness on feet ?  ?Muscle weakness (generalized) ?  ?Other low back pain ?  ?ONSET DATE: 4-5 months ago ?  ?SUBJECTIVE:  ?  ?SUBJECTIVE STATEMENT: ?Yesterday was not a good day.  Had to take a muscle relaxer.   ?  ?PERTINENT HISTORY: ?R TKA 2018, Lumbar fusion all 5. Left ankle OA ?  ?PAIN:  ?Are you having pain? Yes: NPRS scale: 4-5/10 ?Pain location: LB ?Pain description: dull aggrevating with upright position ?Aggravating factors: upright position/walking ?Relieving factors: slumping forward ? ? ?PRECAUTIONS: Back and Fall ?  ?WEIGHT BEARING RESTRICTIONS No ?  ?FALLS:  ?Has patient fallen in last 6 months? Yes. Number of falls 2 ?  ?LIVING ENVIRONMENT: ?Lives with: lives alone ?Lives in: House/apartment ?Stairs: Yes; 2 steps to enter. Level home  ?Has following equipment at home: Single point cane ?OCCUPATION: retired ?  ?PLOF: Independent ?  ?PATIENT GOALS stand in kitchen and cook, laundry, make my bed better, stand upright with less pain, be stronger ?  ?  ?OBJECTIVE:  ?  ?PATIENT SURVEYS:  ?FOTO 31  goal 72 ?  ?COGNITION: ?           Overall cognitive status: Within functional limits for tasks assessed               ?           ?SENSATION: ?WFL ?  ?  ?POSTURE:  ?Forward flex , rounded shoulder ?  ?PALPATION: ?Moderately sensitive to palpation throughout lumbar paraspinals ?  ?  ?Lumbar ROM ?Limited secondary to pain ?Rotation and side bending limited  by 50% R>L ?Extension 0; flex 30 degrees ?  ?  ?LE MMT: ?  ?MMT Right ?08/30/2021 Left ?08/30/2021  ?Hip flexion 3- 3  ?Hip extension 3- 3  ?Hip abduction      ?Hip adduction      ?Hip internal rotation      ?Hip external rotation      ?Knee flexion 3 4  ?Knee extension 4 5  ?Ankle dorsiflexion      ?Ankle plantarflexion      ?Ankle inversion      ?Ankle eversion      ? (Blank rows = not tested) ?  ?  ?FUNCTIONAL TESTS:  ?TUG 29 ?30s STS 2 ?6MWT: 392 ft ?Berg balance:18/56 (High fall risk) ?  ?GAIT: ?Distance walked: 500 )to pool ?Assistive device utilized: none.  (Needs SPC for safety) ?Level of assistance: SBA ?Comments: Antalgic: cadence slowed, decreased step length, forward hip flex, dragging heels (decreased knee and hip flex during swing) ?  ?  ?  ?TODAY'S TREATMENT: ?Pt seen for aquatic therapy today.  Treatment took place in water 3.25-4 ft in depth at the [facility] pool. Temp of water was 91?.  Pt entered/exited the pool via stairs independently with bilat (step to pattern) rail. ? ?Warm up of 3 laps each - forward, backward, and side stepping holding yellow noodle. Cues for relax shoulders. ?Holding onto wall: Heel / toe raises x 12 ?   Hip abdct/add x 10 each leg ?   Hip flex / ext x 10 each leg ?   Squats x 10 ?Trial of forward walking with rainbow hand buoys under water - unable to tolerate ?Holding yellow noodle:  ?   High knee march forward  ?   Side step with squat R/L 1 lap ?    ?At bench in water with blue step under feet:  ?   Sit to/from stand x 10 with cues for hip hinge and nose over toes, arms reaching forward ?   Rt / Lt forward step ups x 10 each ?Return to  forward/ backward walking with yellow noodle for continued walking endurance ? ? ?Pt requires buoyancy for support and to offload joints with strengthening exercises. Viscosity of the water is needed for resist

## 2021-09-04 ENCOUNTER — Other Ambulatory Visit (HOSPITAL_COMMUNITY): Payer: Self-pay

## 2021-09-04 ENCOUNTER — Ambulatory Visit (HOSPITAL_BASED_OUTPATIENT_CLINIC_OR_DEPARTMENT_OTHER): Payer: Medicare Other | Admitting: Physical Therapy

## 2021-09-04 ENCOUNTER — Encounter (HOSPITAL_BASED_OUTPATIENT_CLINIC_OR_DEPARTMENT_OTHER): Payer: Self-pay | Admitting: Physical Therapy

## 2021-09-04 DIAGNOSIS — M6281 Muscle weakness (generalized): Secondary | ICD-10-CM

## 2021-09-04 DIAGNOSIS — M5459 Other low back pain: Secondary | ICD-10-CM

## 2021-09-04 DIAGNOSIS — R2681 Unsteadiness on feet: Secondary | ICD-10-CM

## 2021-09-04 MED ORDER — MOUNJARO 12.5 MG/0.5ML ~~LOC~~ SOAJ
SUBCUTANEOUS | 3 refills | Status: DC
  Filled 2021-09-04: qty 2, 28d supply, fill #0
  Filled 2022-01-30 – 2022-02-12 (×3): qty 2, 28d supply, fill #1
  Filled 2022-03-13: qty 2, 28d supply, fill #2
  Filled 2022-04-09: qty 2, 28d supply, fill #3

## 2021-09-04 NOTE — Therapy (Signed)
?OUTPATIENT PHYSICAL THERAPY TREATMENT NOTE ? ? ?Patient Name: [name] ?MRN: [id number] ?DOB:1946-03-25, 76 y.o., female ?Today's Date: 09/04/2021 ? ? ? PT End of Session - 09/04/21 0854   ? ? Visit Number 3   ? Number of Visits 20   ? Date for PT Re-Evaluation 11/08/21   ? Authorization Type [employer] Mcr   ? Authorization - Visit Number 2   ? Progress Note Due on Visit 10   ? PT Start Time [phone]   ? PT Stop Time 3664   ? PT Time Calculation (min) 45 min   ? Behavior During Therapy [facility] for tasks assessed/performed   ? ?  ?  ? ?  ? ? ?Past Medical History:  ?Diagnosis Date  ? Acute blood loss as cause of postoperative anemia 04/03/2016  ? Anemia   ? hx of  ? Anxiety   ? takes Xanax daily as needed  ? Arthritis   ? Bronchitis   ? a month ago  ? Chronic back pain   ? stenosis  ? Chronic kidney disease   ? stage 3-sees Dr.J Posey Pronto  ? Class 3 obesity due to excess calories with body mass index (BMI) of 40.0 to 44.9 in adult   ? Diabetes mellitus   ? type II; takes Byetta and Invokana daily  ? Glaucoma   ? uses Eye Drops  ? Gout   ? takes Uloric daily  ? History of blood transfusion   ? no abnormal reaction   ? History of colon polyps   ? Hyperlipidemia   ? no meds taken now  ? Hypertension   ? takes HCTZ daily as well as Aldactone  ? Hypothyroidism   ? takes Synthroid daily  ? Pneumonia   ? many yrs ago  ? PONV (postoperative nausea and vomiting)   ? Primary localized osteoarthritis of right knee   ? Stress incontinence   ? Syncope   ? Weakness   ? numbness and tingling in right hip/eg/  ? Wears glasses   ? Wolf-Parkinson-White syndrome   ? takes Atenolol daily  ? ?Past Surgical History:  ?Procedure Laterality Date  ? ABDOMINAL HYSTERECTOMY    ? APPENDECTOMY    ? BACK SURGERY  2006  ? lumb fusion  ? BACK SURGERY  08/16/2014  ? L3-4 fusion  ? BREAST BIOPSY    ? BREAST ENHANCEMENT SURGERY Bilateral   ? BREAST EXCISIONAL BIOPSY Left   ? benign  ? CARDIAC CATHETERIZATION  >31yr ago  ? CARPAL TUNNEL RELEASE Bilateral   ? x4  ?  cataract surgery Bilateral   ? CESAREAN SECTION    ? x 3  ? CHOLECYSTECTOMY  1981  ? COLONOSCOPY    ? KNEE ARTHROSCOPY Right 2009  ? rt  ? RADIOLOGY WITH ANESTHESIA N/A 05/12/2020  ? Procedure: MRI LUMBAR SPINE WITH AND WITHOUT CONTRAST;  Surgeon: Radiologist, Medication, MD;  [place]  Service: Radiology;  Laterality: N/A;  ? RAZ procedure  10+yrs ago  ? REDUCTION MAMMAPLASTY Bilateral 2006  ? ROTATOR CUFF REPAIR Right   ? x 4  ? SHOULDER ARTHROSCOPY WITH ROTATOR CUFF REPAIR AND SUBACROMIAL DECOMPRESSION Right 06/10/2012  ? Procedure: SHOULDER ARTHROSCOPY WITH ROTATOR CUFF REPAIR AND SUBACROMIAL DECOMPRESSION;  Surgeon: RLorn Junes MD;  [place]  Service: Orthopedics;  Laterality: Right;  RIGHT SHOULDER ARTHROSCOPY, DECOMPRESSION SUBACROMIAL PARTIAL ACROMIOPLASTY WITH CORACOACROMIAL RELEASE,, DISTAL CLAVICULECTOMY , ROTATOR CUFF debriedment  ? SKIN BIOPSY    ? removed  moles all over body  ? THYROID SURGERY Right   ? right thyroid removed  ? TOTAL KNEE ARTHROPLASTY Right 04/02/2016  ? Procedure: RIGHT TOTAL KNEE ARTHROPLASTY;  Surgeon: Elsie Saas, MD;  Location: [place];  Service: Orthopedics;  Laterality: Right;  ? TRANSFORAMINAL LUMBAR INTERBODY FUSION (TLIF) WITH PEDICLE SCREW FIXATION 1 LEVEL Left 08/16/2020  ? Procedure: Left Lumbar two-three Transforaminal lumbar interbody fusion with exploration and extension of adjacent level fusion;  Surgeon: Erline Levine, MD;  Location: [place];  Service: Neurosurgery;  Laterality: Left;  ? ?Patient Active Problem List  ? Diagnosis Date Noted  ? Degenerative lumbar spinal stenosis 08/16/2020  ? Acute blood loss as cause of postoperative anemia 04/03/2016  ? Class 3 obesity due to excess calories with body mass index (BMI) of 40.0 to 44.9 in adult   ? Primary localized osteoarthritis of right knee   ? CKD (chronic kidney disease), stage III ([place]) 10/22/2015  ? Syncope 10/21/2015  ? Non-insulin dependent type 2 diabetes mellitus ([place])  10/21/2015  ? Hypothyroidism 10/21/2015  ? Lumbar stenosis with neurogenic claudication 08/16/2014  ? Right rotator cuff tear   ? Anxiety   ? PONV (postoperative nausea and vomiting)   ? GERD (gastroesophageal reflux disease)   ? Arthritis   ? Thyroid disease   ? DIVERTICULOSIS-COLON 07/04/2009  ? Iron deficiency anemia 03/17/2009  ? PERSONAL HX COLONIC POLYPS 03/17/2009  ? SKIN RASH 02/23/2009  ? DIARRHEA 02/23/2009  ? FEVER, HX OF 02/23/2009  ? THYROID NODULE, LEFT 02/11/2009  ? Glaucoma 02/11/2009  ? Coronary atherosclerosis 02/11/2009  ? WOLFF (WOLFE)-PARKINSON-WHITE (WPW) SYNDROME 02/11/2009  ? DEGENERATIVE JOINT DISEASE, ANKLE 02/11/2009  ? Osteoarthritis of spine 02/11/2009  ? Essential hypertension 02/09/2009  ? ?  ?PCP: Donnajean Lopes, MD ?  ?REFERRING PROVIDER: Donnajean Lopes, MD ?  ?REFERRING DIAG: R26.81 (ICD-10-CM) - Unsteadiness on feet ?  ?THERAPY DIAG:  ?Unsteadiness on feet ?  ?Muscle weakness (generalized) ?  ?Other low back pain ?  ?ONSET DATE: 4-5 months ago ?  ?SUBJECTIVE:  ?  ?SUBJECTIVE STATEMENT: ?Yesterday was not a good day.  Had to take a muscle relaxer.   ?  ?PERTINENT HISTORY: ?R TKA 2018, Lumbar fusion all 5. Left ankle OA ?  ?PAIN:  ?Are you having pain? Yes: NPRS scale: 4-5/10 ?Pain location: LB ?Pain description: dull aggrevating with upright position ?Aggravating factors: upright position/walking ?Relieving factors: slumping forward ? ? ?PRECAUTIONS: Back and Fall ?  ?WEIGHT BEARING RESTRICTIONS No ?  ?FALLS:  ?Has patient fallen in last 6 months? Yes. Number of falls 2 ?  ?LIVING ENVIRONMENT: ?Lives with: lives alone ?Lives in: House/apartment ?Stairs: Yes; 2 steps to enter. Level home  ?Has following equipment at home: Single point cane ?OCCUPATION: retired ?  ?PLOF: Independent ?  ?PATIENT GOALS stand in kitchen and cook, laundry, make my bed better, stand upright with less pain, be stronger ?  ?  ?OBJECTIVE:  ?  ?PATIENT SURVEYS:  ?FOTO 31  goal 29 ?  ?COGNITION: ?           Overall cognitive status: Within functional limits for tasks assessed               ?           ?SENSATION: ?WFL ?  ?  ?POSTURE:  ?Forward flex , rounded shoulder ?  ?PALPATION: ?Moderately sensitive to palpation throughout lumbar paraspinals ?  ?  ?Lumbar ROM ?Limited secondary to pain ?Rotation and side bending limited  by 50% R>L ?Extension 0; flex 30 degrees ?  ?  ?LE MMT: ?  ?MMT Right ?08/30/2021 Left ?08/30/2021  ?Hip flexion 3- 3  ?Hip extension 3- 3  ?Hip abduction      ?Hip adduction      ?Hip internal rotation      ?Hip external rotation      ?Knee flexion 3 4  ?Knee extension 4 5  ?Ankle dorsiflexion      ?Ankle plantarflexion      ?Ankle inversion      ?Ankle eversion      ? (Blank rows = not tested) ?  ?  ?FUNCTIONAL TESTS:  ?TUG 29 ?30s STS 2 ?6MWT: 392 ft ?Berg balance:18/56 (High fall risk) ?  ?GAIT: ?Distance walked: 500 )to pool ?Assistive device utilized: none.  (Needs SPC for safety) ?Level of assistance: SBA ?Comments: Antalgic: cadence slowed, decreased step length, forward hip flex, dragging heels (decreased knee and hip flex during swing) ?  ?  ?  ?TODAY'S TREATMENT: ?Pt seen for aquatic therapy today.  Treatment took place in water 3.25-4 ft in depth at the [facility] pool. Temp of water was 91?.  Pt entered/exited the pool via stairs independently with bilat (step to pattern) rail. ? ?Warm up of multiple laps each - forward, backward, and side stepping holding yellow noodle.  ?Holding onto wall: Instr and cus on abdominal bracing and glut isometrics throughout exercises to improve posture ? ?Heel / toe raises x 12 ?   Hip abdct/add 2x 10 each leg ?   Hip ext x10 each leg ?Backward walking widths between exercises for recovery and gentle post chain engagement ? ? Using kick board: ?   LB stretch 4x 20s ?   Lumbar rotation R/L x10 ?    ?    ?At bench in water with blue step under feet:  ?   Cycling 2x1 min ?Sit to/from stand 2x 10 with cues for hip hinge,core stabilization and nose  over toes, arms reaching forward. ?   Rt / Lt forward step ups x 10 each ?Return to forward/ backward walking with yellow hand buoys for continued walking endurance ? ? ?Pt requires buoyancy for support an

## 2021-09-08 ENCOUNTER — Encounter (HOSPITAL_BASED_OUTPATIENT_CLINIC_OR_DEPARTMENT_OTHER): Payer: Self-pay | Admitting: Physical Therapy

## 2021-09-08 ENCOUNTER — Ambulatory Visit (HOSPITAL_BASED_OUTPATIENT_CLINIC_OR_DEPARTMENT_OTHER): Payer: Medicare Other | Admitting: Physical Therapy

## 2021-09-08 DIAGNOSIS — R2681 Unsteadiness on feet: Secondary | ICD-10-CM | POA: Diagnosis not present

## 2021-09-08 DIAGNOSIS — M5459 Other low back pain: Secondary | ICD-10-CM

## 2021-09-08 DIAGNOSIS — M6281 Muscle weakness (generalized): Secondary | ICD-10-CM

## 2021-09-08 NOTE — Therapy (Signed)
?OUTPATIENT PHYSICAL THERAPY TREATMENT NOTE ? ? ?Patient Name: [name] ?MRN: [id number] ?DOB:1945-10-31, 76 y.o., female ?Today's Date: 09/08/2021 ? ? ? PT End of Session - 09/08/21 0936   ? ? Visit Number 4   ? Number of Visits 20   ? Date for PT Re-Evaluation 11/08/21   ? Authorization Type UHC MCR   ? Progress Note Due on Visit 10   ? PT Start Time 4944   ? PT Stop Time 1015   ? PT Time Calculation (min) 43 min   ? Activity Tolerance Patient tolerated treatment well   ? Behavior During Therapy [facility] for tasks assessed/performed   ? ?  ?  ? ?  ? ? ?Past Medical History:  ?Diagnosis Date  ? Acute blood loss as cause of postoperative anemia 04/03/2016  ? Anemia   ? hx of  ? Anxiety   ? takes Xanax daily as needed  ? Arthritis   ? Bronchitis   ? a month ago  ? Chronic back pain   ? stenosis  ? Chronic kidney disease   ? stage 3-sees Dr.J Posey Pronto  ? Class 3 obesity due to excess calories with body mass index (BMI) of 40.0 to 44.9 in adult   ? Diabetes mellitus   ? type II; takes Byetta and Invokana daily  ? Glaucoma   ? uses Eye Drops  ? Gout   ? takes Uloric daily  ? History of blood transfusion   ? no abnormal reaction   ? History of colon polyps   ? Hyperlipidemia   ? no meds taken now  ? Hypertension   ? takes HCTZ daily as well as Aldactone  ? Hypothyroidism   ? takes Synthroid daily  ? Pneumonia   ? many yrs ago  ? PONV (postoperative nausea and vomiting)   ? Primary localized osteoarthritis of right knee   ? Stress incontinence   ? Syncope   ? Weakness   ? numbness and tingling in right hip/eg/  ? Wears glasses   ? Wolf-Parkinson-White syndrome   ? takes Atenolol daily  ? ?Past Surgical History:  ?Procedure Laterality Date  ? ABDOMINAL HYSTERECTOMY    ? APPENDECTOMY    ? BACK SURGERY  2006  ? lumb fusion  ? BACK SURGERY  08/16/2014  ? L3-4 fusion  ? BREAST BIOPSY    ? BREAST ENHANCEMENT SURGERY Bilateral   ? BREAST EXCISIONAL BIOPSY Left   ? benign  ? CARDIAC CATHETERIZATION  >6yr ago  ? CARPAL TUNNEL RELEASE  Bilateral   ? x4  ? cataract surgery Bilateral   ? CESAREAN SECTION    ? x 3  ? CHOLECYSTECTOMY  1981  ? COLONOSCOPY    ? KNEE ARTHROSCOPY Right 2009  ? rt  ? RADIOLOGY WITH ANESTHESIA N/A 05/12/2020  ? Procedure: MRI LUMBAR SPINE WITH AND WITHOUT CONTRAST;  Surgeon: Radiologist, Medication, MD;  [place]  Service: Radiology;  Laterality: N/A;  ? RAZ procedure  10+yrs ago  ? REDUCTION MAMMAPLASTY Bilateral 2006  ? ROTATOR CUFF REPAIR Right   ? x 4  ? SHOULDER ARTHROSCOPY WITH ROTATOR CUFF REPAIR AND SUBACROMIAL DECOMPRESSION Right 06/10/2012  ? Procedure: SHOULDER ARTHROSCOPY WITH ROTATOR CUFF REPAIR AND SUBACROMIAL DECOMPRESSION;  Surgeon: RLorn Junes MD;  [place]  Service: Orthopedics;  Laterality: Right;  RIGHT SHOULDER ARTHROSCOPY, DECOMPRESSION SUBACROMIAL PARTIAL ACROMIOPLASTY WITH CORACOACROMIAL RELEASE,, DISTAL CLAVICULECTOMY , ROTATOR CUFF debriedment  ? SKIN BIOPSY    ?  removed moles all over body  ? THYROID SURGERY Right   ? right thyroid removed  ? TOTAL KNEE ARTHROPLASTY Right 04/02/2016  ? Procedure: RIGHT TOTAL KNEE ARTHROPLASTY;  Surgeon: Elsie Saas, MD;  Location: [place];  Service: Orthopedics;  Laterality: Right;  ? TRANSFORAMINAL LUMBAR INTERBODY FUSION (TLIF) WITH PEDICLE SCREW FIXATION 1 LEVEL Left 08/16/2020  ? Procedure: Left Lumbar two-three Transforaminal lumbar interbody fusion with exploration and extension of adjacent level fusion;  Surgeon: Erline Levine, MD;  Location: [place];  Service: Neurosurgery;  Laterality: Left;  ? ?Patient Active Problem List  ? Diagnosis Date Noted  ? Degenerative lumbar spinal stenosis 08/16/2020  ? Acute blood loss as cause of postoperative anemia 04/03/2016  ? Class 3 obesity due to excess calories with body mass index (BMI) of 40.0 to 44.9 in adult   ? Primary localized osteoarthritis of right knee   ? CKD (chronic kidney disease), stage III ([place]) 10/22/2015  ? Syncope 10/21/2015  ? Non-insulin dependent type 2  diabetes mellitus ([place]) 10/21/2015  ? Hypothyroidism 10/21/2015  ? Lumbar stenosis with neurogenic claudication 08/16/2014  ? Right rotator cuff tear   ? Anxiety   ? PONV (postoperative nausea and vomiting)   ? GERD (gastroesophageal reflux disease)   ? Arthritis   ? Thyroid disease   ? DIVERTICULOSIS-COLON 07/04/2009  ? Iron deficiency anemia 03/17/2009  ? PERSONAL HX COLONIC POLYPS 03/17/2009  ? SKIN RASH 02/23/2009  ? DIARRHEA 02/23/2009  ? FEVER, HX OF 02/23/2009  ? THYROID NODULE, LEFT 02/11/2009  ? Glaucoma 02/11/2009  ? Coronary atherosclerosis 02/11/2009  ? WOLFF (WOLFE)-PARKINSON-WHITE (WPW) SYNDROME 02/11/2009  ? DEGENERATIVE JOINT DISEASE, ANKLE 02/11/2009  ? Osteoarthritis of spine 02/11/2009  ? Essential hypertension 02/09/2009  ? ?  ?PCP: Donnajean Lopes, MD ?  ?REFERRING PROVIDER: Donnajean Lopes, MD ?  ?REFERRING DIAG: R26.81 (ICD-10-CM) - Unsteadiness on feet ?  ?THERAPY DIAG:  ?Unsteadiness on feet ?  ?Muscle weakness (generalized) ?  ?Other low back pain ?  ?ONSET DATE: 4-5 months ago ?  ?SUBJECTIVE:  ?  ?SUBJECTIVE STATEMENT: ?Pt reports she has been working on clenching abs and butt.  No other changes.  ?  ?PERTINENT HISTORY: ?R TKA 2018, Lumbar fusion all 5. Left ankle OA ?  ?PAIN:  ?Are you having pain? No: NPRS scale: 0/10 ?Pain location: LB ?Pain description:  ?Aggravating factors: upright position/walking ?Relieving factors: slumping forward ? ? ?PRECAUTIONS: Back and Fall ?  ?WEIGHT BEARING RESTRICTIONS No ?  ?FALLS:  ?Has patient fallen in last 6 months? Yes. Number of falls 2 ?  ?LIVING ENVIRONMENT: ?Lives with: lives alone ?Lives in: House/apartment ?Stairs: Yes; 2 steps to enter. Level home  ?Has following equipment at home: Single point cane ?OCCUPATION: retired ?  ?PLOF: Independent ?  ?PATIENT GOALS stand in kitchen and cook, laundry, make my bed better, stand upright with less pain, be stronger ?  ?  ?OBJECTIVE:  ?  ?PATIENT SURVEYS:  ?FOTO 31  goal 89 ?  ?COGNITION: ?           Overall cognitive status: Within functional limits for tasks assessed               ?           ?SENSATION: ?WFL ?  ?  ?POSTURE:  ?Forward flex , rounded shoulder ?  ?PALPATION: ?Moderately sensitive to palpation throughout lumbar paraspinals ?  ?  ?Lumbar ROM ?Limited secondary to pain ?Rotation and side bending limited by 50%  R>L ?Extension 0; flex 30 degrees ?  ?  ?LE MMT: ?  ?MMT Right ?08/30/2021 Left ?08/30/2021  ?Hip flexion 3- 3  ?Hip extension 3- 3  ?Hip abduction      ?Hip adduction      ?Hip internal rotation      ?Hip external rotation      ?Knee flexion 3 4  ?Knee extension 4 5  ?Ankle dorsiflexion      ?Ankle plantarflexion      ?Ankle inversion      ?Ankle eversion      ? (Blank rows = not tested) ?  ?  ?FUNCTIONAL TESTS:  ?TUG 29 ?30s STS 2 ?6MWT: 392 ft ?Berg balance:18/56 (High fall risk) ? ?BERG Balance Test         Date: 08/30/21  ?Standing unsupported 3  ?Sitting with back unsupported but feet supported 4  ?Stand to sit 2  ?Transfers  1  ?Standing unsupported with eyes closed 2  ?Standing unsupported feet together 2  ?From standing position, reach forward with outstretched arm 0  ?From standing position, pick up object from floor 0  ?From standing position, turn and look behind over each shoulder 1  ?Turn 360? 1  ?Standing unsupported, alternately place foot on step 0  ?Standing unsupported, one foot in front 0  ?Standing on one leg 0  ?Total:  18  ? ?  ? ?GAIT: ?Distance walked: 500 )to pool ?Assistive device utilized: none.  (Needs SPC for safety) ?Level of assistance: SBA ?Comments: Antalgic: cadence slowed, decreased step length, forward hip flex, dragging heels (decreased knee and hip flex during swing) ?  ?  ?  ?TODAY'S TREATMENT: ?Pt seen for aquatic therapy today.  Treatment took place in water 3.25-4 ft in depth at the [facility] pool. Temp of water was 90?Marland Kitchen  Pt entered/exited the pool via stairs independently with bilat (step to pattern) rail. ? ?Warm up of multiple laps each -  forward, backward, and side stepping holding yellow noodle.  ?SLS holding yellow noodle, 10 sec x 3 each LE ?Standing forward reach, hinging at hips, arms on kickboard - x 6 (after 1st rep, moved back agains

## 2021-09-09 ENCOUNTER — Other Ambulatory Visit (HOSPITAL_COMMUNITY): Payer: Self-pay

## 2021-09-11 ENCOUNTER — Ambulatory Visit (HOSPITAL_BASED_OUTPATIENT_CLINIC_OR_DEPARTMENT_OTHER): Payer: Medicare Other | Admitting: Physical Therapy

## 2021-09-11 ENCOUNTER — Encounter (HOSPITAL_BASED_OUTPATIENT_CLINIC_OR_DEPARTMENT_OTHER): Payer: Self-pay | Admitting: Physical Therapy

## 2021-09-11 DIAGNOSIS — R2681 Unsteadiness on feet: Secondary | ICD-10-CM

## 2021-09-11 DIAGNOSIS — M5459 Other low back pain: Secondary | ICD-10-CM

## 2021-09-11 DIAGNOSIS — M6281 Muscle weakness (generalized): Secondary | ICD-10-CM

## 2021-09-11 NOTE — Therapy (Signed)
?OUTPATIENT PHYSICAL THERAPY TREATMENT NOTE ? ? ?Patient Name: [name] ?MRN: [id number] ?DOB:08-Feb-1946, 76 y.o., female ?Today's Date: 09/11/2021 ? ? ? PT End of Session - 09/11/21 7846   ? ? Visit Number 5   ? Number of Visits 20   ? Date for PT Re-Evaluation 11/08/21   ? Authorization Type UHC MCR   ? Progress Note Due on Visit 10   ? PT Start Time [phone]   ? PT Stop Time 5284   ? PT Time Calculation (min) 40 min   ? Activity Tolerance Patient tolerated treatment well   ? Behavior During Therapy [facility] for tasks assessed/performed   ? ?  ?  ? ?  ? ? ?Past Medical History:  ?Diagnosis Date  ? Acute blood loss as cause of postoperative anemia 04/03/2016  ? Anemia   ? hx of  ? Anxiety   ? takes Xanax daily as needed  ? Arthritis   ? Bronchitis   ? a month ago  ? Chronic back pain   ? stenosis  ? Chronic kidney disease   ? stage 3-sees Dr.J Posey Pronto  ? Class 3 obesity due to excess calories with body mass index (BMI) of 40.0 to 44.9 in adult   ? Diabetes mellitus   ? type II; takes Byetta and Invokana daily  ? Glaucoma   ? uses Eye Drops  ? Gout   ? takes Uloric daily  ? History of blood transfusion   ? no abnormal reaction   ? History of colon polyps   ? Hyperlipidemia   ? no meds taken now  ? Hypertension   ? takes HCTZ daily as well as Aldactone  ? Hypothyroidism   ? takes Synthroid daily  ? Pneumonia   ? many yrs ago  ? PONV (postoperative nausea and vomiting)   ? Primary localized osteoarthritis of right knee   ? Stress incontinence   ? Syncope   ? Weakness   ? numbness and tingling in right hip/eg/  ? Wears glasses   ? Wolf-Parkinson-White syndrome   ? takes Atenolol daily  ? ?Past Surgical History:  ?Procedure Laterality Date  ? ABDOMINAL HYSTERECTOMY    ? APPENDECTOMY    ? BACK SURGERY  2006  ? lumb fusion  ? BACK SURGERY  08/16/2014  ? L3-4 fusion  ? BREAST BIOPSY    ? BREAST ENHANCEMENT SURGERY Bilateral   ? BREAST EXCISIONAL BIOPSY Left   ? benign  ? CARDIAC CATHETERIZATION  >46yr ago  ? CARPAL TUNNEL RELEASE  Bilateral   ? x4  ? cataract surgery Bilateral   ? CESAREAN SECTION    ? x 3  ? CHOLECYSTECTOMY  1981  ? COLONOSCOPY    ? KNEE ARTHROSCOPY Right 2009  ? rt  ? RADIOLOGY WITH ANESTHESIA N/A 05/12/2020  ? Procedure: MRI LUMBAR SPINE WITH AND WITHOUT CONTRAST;  Surgeon: Radiologist, Medication, MD;  [place]  Service: Radiology;  Laterality: N/A;  ? RAZ procedure  10+yrs ago  ? REDUCTION MAMMAPLASTY Bilateral 2006  ? ROTATOR CUFF REPAIR Right   ? x 4  ? SHOULDER ARTHROSCOPY WITH ROTATOR CUFF REPAIR AND SUBACROMIAL DECOMPRESSION Right 06/10/2012  ? Procedure: SHOULDER ARTHROSCOPY WITH ROTATOR CUFF REPAIR AND SUBACROMIAL DECOMPRESSION;  Surgeon: RLorn Junes MD;  [place]  Service: Orthopedics;  Laterality: Right;  RIGHT SHOULDER ARTHROSCOPY, DECOMPRESSION SUBACROMIAL PARTIAL ACROMIOPLASTY WITH CORACOACROMIAL RELEASE,, DISTAL CLAVICULECTOMY , ROTATOR CUFF debriedment  ? SKIN BIOPSY    ?  removed moles all over body  ? THYROID SURGERY Right   ? right thyroid removed  ? TOTAL KNEE ARTHROPLASTY Right 04/02/2016  ? Procedure: RIGHT TOTAL KNEE ARTHROPLASTY;  Surgeon: Elsie Saas, MD;  Location: [place];  Service: Orthopedics;  Laterality: Right;  ? TRANSFORAMINAL LUMBAR INTERBODY FUSION (TLIF) WITH PEDICLE SCREW FIXATION 1 LEVEL Left 08/16/2020  ? Procedure: Left Lumbar two-three Transforaminal lumbar interbody fusion with exploration and extension of adjacent level fusion;  Surgeon: Erline Levine, MD;  Location: [place];  Service: Neurosurgery;  Laterality: Left;  ? ?Patient Active Problem List  ? Diagnosis Date Noted  ? Degenerative lumbar spinal stenosis 08/16/2020  ? Acute blood loss as cause of postoperative anemia 04/03/2016  ? Class 3 obesity due to excess calories with body mass index (BMI) of 40.0 to 44.9 in adult   ? Primary localized osteoarthritis of right knee   ? CKD (chronic kidney disease), stage III ([place]) 10/22/2015  ? Syncope 10/21/2015  ? Non-insulin dependent type 2  diabetes mellitus ([place]) 10/21/2015  ? Hypothyroidism 10/21/2015  ? Lumbar stenosis with neurogenic claudication 08/16/2014  ? Right rotator cuff tear   ? Anxiety   ? PONV (postoperative nausea and vomiting)   ? GERD (gastroesophageal reflux disease)   ? Arthritis   ? Thyroid disease   ? DIVERTICULOSIS-COLON 07/04/2009  ? Iron deficiency anemia 03/17/2009  ? PERSONAL HX COLONIC POLYPS 03/17/2009  ? SKIN RASH 02/23/2009  ? DIARRHEA 02/23/2009  ? FEVER, HX OF 02/23/2009  ? THYROID NODULE, LEFT 02/11/2009  ? Glaucoma 02/11/2009  ? Coronary atherosclerosis 02/11/2009  ? WOLFF (WOLFE)-PARKINSON-WHITE (WPW) SYNDROME 02/11/2009  ? DEGENERATIVE JOINT DISEASE, ANKLE 02/11/2009  ? Osteoarthritis of spine 02/11/2009  ? Essential hypertension 02/09/2009  ? ?  ?PCP: Donnajean Lopes, MD ?  ?REFERRING PROVIDER: Donnajean Lopes, MD ?  ?REFERRING DIAG: R26.81 (ICD-10-CM) - Unsteadiness on feet ?  ?THERAPY DIAG:  ?Unsteadiness on feet ?  ?Muscle weakness (generalized) ?  ?Other low back pain ?  ?ONSET DATE: 4-5 months ago ?  ?SUBJECTIVE:  ?  ?SUBJECTIVE STATEMENT: ?Had an overactive day yesterday and my back is really hurting. ?  ?PERTINENT HISTORY: ?R TKA 2018, Lumbar fusion all 5. Left ankle OA ?  ?PAIN:  ?Are you having pain? No: NPRS scale: 5/10 ?Pain location: LB ?Pain description:  ?Aggravating factors: upright position/walking ?Relieving factors: slumping forward ? ? ?PRECAUTIONS: Back and Fall ?  ?WEIGHT BEARING RESTRICTIONS No ?  ?FALLS:  ?Has patient fallen in last 6 months? Yes. Number of falls 2 ?  ?LIVING ENVIRONMENT: ?Lives with: lives alone ?Lives in: House/apartment ?Stairs: Yes; 2 steps to enter. Level home  ?Has following equipment at home: Single point cane ?OCCUPATION: retired ?  ?PLOF: Independent ?  ?PATIENT GOALS stand in kitchen and cook, laundry, make my bed better, stand upright with less pain, be stronger ?  ?  ?OBJECTIVE:  ?  ?PATIENT SURVEYS:  ?FOTO 31  goal 74 ?  ?COGNITION: ?          Overall  cognitive status: Within functional limits for tasks assessed               ?           ?SENSATION: ?WFL ?  ?  ?POSTURE:  ?Forward flex , rounded shoulder ?  ?PALPATION: ?Moderately sensitive to palpation throughout lumbar paraspinals ?  ?  ?Lumbar ROM ?Limited secondary to pain ?Rotation and side bending limited by 50% R>L ?Extension 0; flex 30  degrees ?  ?  ?LE MMT: ?  ?MMT Right ?08/30/2021 Left ?08/30/2021  ?Hip flexion 3- 3  ?Hip extension 3- 3  ?Hip abduction      ?Hip adduction      ?Hip internal rotation      ?Hip external rotation      ?Knee flexion 3 4  ?Knee extension 4 5  ?Ankle dorsiflexion      ?Ankle plantarflexion      ?Ankle inversion      ?Ankle eversion      ? (Blank rows = not tested) ?  ?  ?FUNCTIONAL TESTS:  ?TUG 29 ?30s STS 2 ?6MWT: 392 ft ?Berg balance:18/56 (High fall risk) ? ?BERG Balance Test         Date: 08/30/21  ?Standing unsupported 3  ?Sitting with back unsupported but feet supported 4  ?Stand to sit 2  ?Transfers  1  ?Standing unsupported with eyes closed 2  ?Standing unsupported feet together 2  ?From standing position, reach forward with outstretched arm 0  ?From standing position, pick up object from floor 0  ?From standing position, turn and look behind over each shoulder 1  ?Turn 360? 1  ?Standing unsupported, alternately place foot on step 0  ?Standing unsupported, one foot in front 0  ?Standing on one leg 0  ?Total:  18  ? ?  ? ?GAIT: ?Distance walked: 500 )to pool ?Assistive device utilized: none.  (Needs SPC for safety) ?Level of assistance: SBA ?Comments: Antalgic: cadence slowed, decreased step length, forward hip flex, dragging heels (decreased knee and hip flex during swing) ?  ?  ?  ?TODAY'S TREATMENT: ?Pt seen for aquatic therapy today.  Treatment took place in water 3.25-4 ft in depth at the [facility] pool. Temp of water was 90?Marland Kitchen  Pt entered/exited the pool via stairs independently with bilat (step to pattern) rail. ? ?Warm up of multiple laps each - forward,  backward, and side stepping holding yellow noodle.  ?Supine suspension for LBP distraction and relief x 5 minutes ?-side bending R/L in position ?Stanidng 4 ft:  ?SLS holding yellow noodle, 10 sec x 3 each LE ?Cherlynn Kaiser

## 2021-09-12 ENCOUNTER — Other Ambulatory Visit (HOSPITAL_COMMUNITY): Payer: Self-pay

## 2021-09-12 MED ORDER — MOUNJARO 12.5 MG/0.5ML ~~LOC~~ SOAJ
SUBCUTANEOUS | 1 refills | Status: DC
  Filled 2021-09-12: qty 6, 84d supply, fill #0
  Filled 2021-12-04: qty 2, 28d supply, fill #1
  Filled 2021-12-25: qty 2, 28d supply, fill #2
  Filled 2022-01-22: qty 2, 28d supply, fill #3

## 2021-09-14 ENCOUNTER — Encounter (HOSPITAL_BASED_OUTPATIENT_CLINIC_OR_DEPARTMENT_OTHER): Payer: Self-pay | Admitting: Physical Therapy

## 2021-09-14 ENCOUNTER — Ambulatory Visit (HOSPITAL_BASED_OUTPATIENT_CLINIC_OR_DEPARTMENT_OTHER): Payer: Medicare Other | Admitting: Physical Therapy

## 2021-09-14 DIAGNOSIS — M5459 Other low back pain: Secondary | ICD-10-CM

## 2021-09-14 DIAGNOSIS — R2681 Unsteadiness on feet: Secondary | ICD-10-CM | POA: Diagnosis not present

## 2021-09-14 DIAGNOSIS — M6281 Muscle weakness (generalized): Secondary | ICD-10-CM

## 2021-09-14 NOTE — Therapy (Signed)
OUTPATIENT PHYSICAL THERAPY TREATMENT NOTE   Patient Name: [name] MRN: [id number] DOB:April 07, 1946, 76 y.o., female Today's Date: 09/14/2021    PT End of Session - 09/14/21 1212     Visit Number 6    Number of Visits 20    Date for PT Re-Evaluation 11/08/21    Authorization Type UHC MCR    PT Start Time 1205    PT Stop Time 1245    PT Time Calculation (min) 40 min             Past Medical History:  Diagnosis Date   Acute blood loss as cause of postoperative anemia 04/03/2016   Anemia    hx of   Anxiety    takes Xanax daily as needed   Arthritis    Bronchitis    a month ago   Chronic back pain    stenosis   Chronic kidney disease    stage 3-sees Dr.J Patel   Class 3 obesity due to excess calories with body mass index (BMI) of 40.0 to 44.9 in adult    Diabetes mellitus    type II; takes Byetta and Invokana daily   Glaucoma    uses Eye Drops   Gout    takes Uloric daily   History of blood transfusion    no abnormal reaction    History of colon polyps    Hyperlipidemia    no meds taken now   Hypertension    takes HCTZ daily as well as Aldactone   Hypothyroidism    takes Synthroid daily   Pneumonia    many yrs ago   PONV (postoperative nausea and vomiting)    Primary localized osteoarthritis of right knee    Stress incontinence    Syncope    Weakness    numbness and tingling in right hip/eg/   Wears glasses    Wolf-Parkinson-White syndrome    takes Atenolol daily   Past Surgical History:  Procedure Laterality Date   ABDOMINAL HYSTERECTOMY     APPENDECTOMY     BACK SURGERY  2006   lumb fusion   BACK SURGERY  08/16/2014   L3-4 fusion   BREAST BIOPSY     BREAST ENHANCEMENT SURGERY Bilateral    BREAST EXCISIONAL BIOPSY Left    benign   CARDIAC CATHETERIZATION  >66yr ago   CARPAL TUNNEL RELEASE Bilateral    x4   cataract surgery Bilateral    CESAREAN SECTION     x 3   CHOLECYSTECTOMY  1981   COLONOSCOPY     KNEE ARTHROSCOPY Right 2009    rt   RADIOLOGY WITH ANESTHESIA N/A 05/12/2020   Procedure: MRI LUMBAR SPINE WITH AND WITHOUT CONTRAST;  Surgeon: Radiologist, Medication, MD;  [place] [facility]  Service: Radiology;  Laterality: N/A;   RAZ procedure  10+yrs ago   REDUCTION MAMMAPLASTY Bilateral 2006   ROTATOR CUFF REPAIR Right    x 4   SHOULDER ARTHROSCOPY WITH ROTATOR CUFF REPAIR AND SUBACROMIAL DECOMPRESSION Right 06/10/2012   Procedure: SHOULDER ARTHROSCOPY WITH ROTATOR CUFF REPAIR AND SUBACROMIAL DECOMPRESSION;  Surgeon: RLorn Junes MD;  [facility] [facility]  Service: Orthopedics;  Laterality: Right;  RIGHT SHOULDER ARTHROSCOPY, DECOMPRESSION SUBACROMIAL PARTIAL ACROMIOPLASTY WITH CORACOACROMIAL RELEASE,, DISTAL CLAVICULECTOMY , ROTATOR CUFF debriedment   SKIN BIOPSY     removed moles all over body   THYROID SURGERY Right    right thyroid removed   TOTAL KNEE ARTHROPLASTY Right 04/02/2016   Procedure: RIGHT TOTAL  KNEE ARTHROPLASTY;  Surgeon: Elsie Saas, MD;  Location: [place];  Service: Orthopedics;  Laterality: Right;   TRANSFORAMINAL LUMBAR INTERBODY FUSION (TLIF) WITH PEDICLE SCREW FIXATION 1 LEVEL Left 08/16/2020   Procedure: Left Lumbar two-three Transforaminal lumbar interbody fusion with exploration and extension of adjacent level fusion;  Surgeon: Erline Levine, MD;  Location: [place];  Service: Neurosurgery;  Laterality: Left;   Patient Active Problem List   Diagnosis Date Noted   Degenerative lumbar spinal stenosis 08/16/2020   Acute blood loss as cause of postoperative anemia 04/03/2016   Class 3 obesity due to excess calories with body mass index (BMI) of 40.0 to 44.9 in adult    Primary localized osteoarthritis of right knee    CKD (chronic kidney disease), stage III ([place]) 10/22/2015   Syncope 10/21/2015   Non-insulin dependent type 2 diabetes mellitus ([place]) 10/21/2015   Hypothyroidism 10/21/2015   Lumbar stenosis with neurogenic claudication 08/16/2014   Right rotator cuff tear     Anxiety    PONV (postoperative nausea and vomiting)    GERD (gastroesophageal reflux disease)    Arthritis    Thyroid disease    DIVERTICULOSIS-COLON 07/04/2009   Iron deficiency anemia 03/17/2009   PERSONAL HX COLONIC POLYPS 03/17/2009   SKIN RASH 02/23/2009   DIARRHEA 02/23/2009   FEVER, HX OF 02/23/2009   THYROID NODULE, LEFT 02/11/2009   Glaucoma 02/11/2009   Coronary atherosclerosis 02/11/2009   WOLFF (WOLFE)-PARKINSON-WHITE (WPW) SYNDROME 02/11/2009   DEGENERATIVE JOINT DISEASE, ANKLE 02/11/2009   Osteoarthritis of spine 02/11/2009   Essential hypertension 02/09/2009     PCP: Donnajean Lopes, MD   REFERRING PROVIDER: Donnajean Lopes, MD   REFERRING DIAG: R26.81 (ICD-10-CM) - Unsteadiness on feet   THERAPY DIAG:  Unsteadiness on feet   Muscle weakness (generalized)   Other low back pain   ONSET DATE: 4-5 months ago   SUBJECTIVE:    SUBJECTIVE STATEMENT: "I woke up not feeling great today, my balance is off"   PERTINENT HISTORY: R TKA 2018, Lumbar fusion all 5. Left ankle OA   PAIN:  Are you having pain? No: NPRS scale: 2-3/10 Pain location: LB Pain description:  Aggravating factors: upright position/walking Relieving factors: slumping forward   PRECAUTIONS: Back and Fall   WEIGHT BEARING RESTRICTIONS No   FALLS:  Has patient fallen in last 6 months? Yes. Number of falls 2   LIVING ENVIRONMENT: Lives with: lives alone Lives in: House/apartment Stairs: Yes; 2 steps to enter. Level home  Has following equipment at home: Single point cane OCCUPATION: retired   PLOF: [place] stand in [employer], laundry, make my bed better, stand upright with less pain, be stronger     OBJECTIVE:    PATIENT SURVEYS:  FOTO 31  goal 48   COGNITION:           Overall cognitive status: Within functional limits for tasks assessed                          SENSATION: WFL     POSTURE:  Forward flex , rounded shoulder    PALPATION: Moderately sensitive to palpation throughout lumbar paraspinals     Lumbar ROM Limited secondary to pain Rotation and side bending limited by 50% R>L Extension 0; flex 30 degrees     LE MMT:   MMT Right 08/30/2021 Left 08/30/2021  Hip flexion 3- 3  Hip extension 3- 3  Hip abduction  Hip adduction      Hip internal rotation      Hip external rotation      Knee flexion 3 4  Knee extension 4 5  Ankle dorsiflexion      Ankle plantarflexion      Ankle inversion      Ankle eversion       (Blank rows = not tested)     FUNCTIONAL TESTS:  TUG 29 30s STS 2 6MWT: 392 ft Berg balance:18/56 (High fall risk)  BERG Balance Test         Date: 08/30/21  Standing unsupported 3  Sitting with back unsupported but feet supported 4  Stand to sit 2  Transfers  1  Standing unsupported with eyes closed 2  Standing unsupported feet together 2  From standing position, reach forward with outstretched arm 0  From standing position, pick up object from floor 0  From standing position, turn and look behind over each shoulder 1  Turn 360 1  Standing unsupported, alternately place foot on step 0  Standing unsupported, one foot in front 0  Standing on one leg 0  Total:  18      GAIT: Distance walked: 500 )to pool Assistive device utilized: none.  (Needs SPC for safety) Level of assistance: SBA Comments: Antalgic: cadence slowed, decreased step length, forward hip flex, dragging heels (decreased knee and hip flex during swing)       TODAY'S TREATMENT: Pt seen for aquatic therapy today.  Treatment took place in water 3.25-4 ft in depth at the [facility] pool. Temp of water was 90.  Pt entered/exited the pool via stairs independently with bilat (step to pattern) rail.  Warm up of multiple laps each - forward, backward, and side stepping holding yellow noodle.   Stanidng 4 ft:  Ue supported by 2 foam hand buoys: tr, hr, marching, add/abd x10 reps. Cues for core  stability SLS holding yellow noodle, 10 sec x 3 each LE Squoodle pushdown (core engagement) 2x7. Cues for abdominal bracing and glute squeeze throughout Standing forward reach, hinging at hips, arms on kickboard - x 4 with 20 second  hold for stretch -Lumbar rotation R/L with kick board x12 Standing kickboard -push pull 2x15 Alternating foot taps to blue step in 3 ft bottom step x 10 no ue support Seated on 4th step STS 2x6 cues for weight shift and immediate standing balance control/core control Backward and side stepping throughout for recovery (with focus on lateral and posterior chain) walking with yellow noodle    Pt requires buoyancy for support and to offload joints with strengthening exercises. Viscosity of the water is needed for resistance of strengthening; water current perturbations provides challenge to standing balance unsupported, requiring increased core activation.     PATIENT EDUCATION:  Education details: see treatment Person educated: Patient Education method: Explanation Education comprehension: verbalized understanding     HOME EXERCISE PROGRAM: [id number] from last episode   ASSESSMENT:   CLINICAL IMPRESSION: Increased fatigue and tiredness today.  Pt reports unknown etiology. Required added rest periods to tolerate session.  Cues continue for posture/upright postioning as pt tending toward forward hip flexed position with standing and amb.  She is able to right with cues. Goals ongoing.   Increased right sided LB discomfort today due to overactivity yesterday as reported by pt. Focused on stretching of core muscles and posterior chain strengthening.  Cues for improved posture and core stabilization throughout exercises.Pt verbalizes seeing benefit of aquatic therapy with decreased  tightness and pain reduction (2 pts) today after session.     OBJECTIVE IMPAIRMENTS decreased activity tolerance, decreased balance, decreased endurance, decreased mobility, difficulty  walking, decreased strength, and pain.    ACTIVITY LIMITATIONS cleaning, community activity, laundry, and shopping.    PERSONAL FACTORS Age, Fitness, and 1-2 comorbidities: OA, lumbar fusions  are also affecting patient's functional outcome.      REHAB POTENTIAL: Good   CLINICAL DECISION MAKING: Evolving/moderate complexity   EVALUATION COMPLEXITY: Moderate     GOALS: Goals reviewed with patient? Yes   SHORT TERM GOALS: Target date: 09/27/2021   Pt will make bed, changing linen in less than 15 minutes Baseline:1 hour Goal status: INITIAL   2.  Berg balance score to improve to 28/56 to demonstrate improving balance decreasing fall risk Baseline: 18/56 Goal status: INITIAL   3.  Tug score will improve to less than 20s to demonstrate improving functional mobility Baseline: 29s Goal status: INITIAL       LONG TERM GOALS: Target date: 11/08/2021   Pt will be able to stand (and cook) for up >15 minutes Baseline:  Goal status: INITIAL   2.  Pt will return to water aerobics with [facility] attending 1-2x weekly demonstrating return to PLOF @ DC last episode Baseline:  Goal status: INITIAL   3.  Foto to meet stated goal (54) Baseline: 34 Goal status: INITIAL   4.  Hip strength to improve >or= 1 grade Baseline: 3- to 3/5 Goal status: INITIAL   5.  Pt will improve on Berg balance to 35/56 or > to demonstrate decreased fall risk Baseline:18/56  Goal status: INITIAL     PT FREQUENCY: 1-2x/week   PT DURATION: 10 weeks   PLANNED INTERVENTIONS: Therapeutic exercises, Therapeutic activity, Neuromuscular re-education, Balance training, Gait training, Patient/Family education, and Joint mobilization   PLAN FOR NEXT SESSION: Progress note; Aquatics for core and LE strengthening, aerobic capacity and balance retraining.    Stanton Kidney Tharon Aquas) Penelope Fittro MPT 09/14/21 1:35 PM

## 2021-09-18 ENCOUNTER — Encounter (HOSPITAL_BASED_OUTPATIENT_CLINIC_OR_DEPARTMENT_OTHER): Payer: Self-pay | Admitting: Physical Therapy

## 2021-09-18 ENCOUNTER — Ambulatory Visit (HOSPITAL_BASED_OUTPATIENT_CLINIC_OR_DEPARTMENT_OTHER): Payer: Medicare Other | Admitting: Physical Therapy

## 2021-09-18 DIAGNOSIS — R2681 Unsteadiness on feet: Secondary | ICD-10-CM | POA: Diagnosis not present

## 2021-09-18 DIAGNOSIS — M6281 Muscle weakness (generalized): Secondary | ICD-10-CM

## 2021-09-18 DIAGNOSIS — M5459 Other low back pain: Secondary | ICD-10-CM

## 2021-09-18 NOTE — Therapy (Signed)
OUTPATIENT PHYSICAL THERAPY TREATMENT NOTE   Patient Name: [name] MRN: [id number] DOB:30-Aug-1945, 76 y.o., female Today's Date: 09/18/2021    PT End of Session - 09/18/21 0858     Visit Number 7    Number of Visits 20    Date for PT Re-Evaluation 11/08/21    Authorization Type UHC MCR    PT Start Time 9518    PT Stop Time 0942    PT Time Calculation (min) 45 min             Past Medical History:  Diagnosis Date   Acute blood loss as cause of postoperative anemia 04/03/2016   Anemia    hx of   Anxiety    takes Xanax daily as needed   Arthritis    Bronchitis    a month ago   Chronic back pain    stenosis   Chronic kidney disease    stage 3-sees Dr.J Patel   Class 3 obesity due to excess calories with body mass index (BMI) of 40.0 to 44.9 in adult    Diabetes mellitus    type II; takes Byetta and Invokana daily   Glaucoma    uses Eye Drops   Gout    takes Uloric daily   History of blood transfusion    no abnormal reaction    History of colon polyps    Hyperlipidemia    no meds taken now   Hypertension    takes HCTZ daily as well as Aldactone   Hypothyroidism    takes Synthroid daily   Pneumonia    many yrs ago   PONV (postoperative nausea and vomiting)    Primary localized osteoarthritis of right knee    Stress incontinence    Syncope    Weakness    numbness and tingling in right hip/eg/   Wears glasses    Wolf-Parkinson-White syndrome    takes Atenolol daily   Past Surgical History:  Procedure Laterality Date   ABDOMINAL HYSTERECTOMY     APPENDECTOMY     BACK SURGERY  2006   lumb fusion   BACK SURGERY  08/16/2014   L3-4 fusion   BREAST BIOPSY     BREAST ENHANCEMENT SURGERY Bilateral    BREAST EXCISIONAL BIOPSY Left    benign   CARDIAC CATHETERIZATION  >16yr ago   CARPAL TUNNEL RELEASE Bilateral    x4   cataract surgery Bilateral    CESAREAN SECTION     x 3   CHOLECYSTECTOMY  1981   COLONOSCOPY     KNEE ARTHROSCOPY Right 2009    rt   RADIOLOGY WITH ANESTHESIA N/A 05/12/2020   Procedure: MRI LUMBAR SPINE WITH AND WITHOUT CONTRAST;  Surgeon: Radiologist, Medication, MD;  [place] [facility]  Service: Radiology;  Laterality: N/A;   RAZ procedure  10+yrs ago   REDUCTION MAMMAPLASTY Bilateral 2006   ROTATOR CUFF REPAIR Right    x 4   SHOULDER ARTHROSCOPY WITH ROTATOR CUFF REPAIR AND SUBACROMIAL DECOMPRESSION Right 06/10/2012   Procedure: SHOULDER ARTHROSCOPY WITH ROTATOR CUFF REPAIR AND SUBACROMIAL DECOMPRESSION;  Surgeon: RLorn Junes MD;  [place] [facility]  Service: Orthopedics;  Laterality: Right;  RIGHT SHOULDER ARTHROSCOPY, DECOMPRESSION SUBACROMIAL PARTIAL ACROMIOPLASTY WITH CORACOACROMIAL RELEASE,, DISTAL CLAVICULECTOMY , ROTATOR CUFF debriedment   SKIN BIOPSY     removed moles all over body   THYROID SURGERY Right    right thyroid removed   TOTAL KNEE ARTHROPLASTY Right 04/02/2016   Procedure: RIGHT TOTAL  KNEE ARTHROPLASTY;  Surgeon: Elsie Saas, MD;  Location: [place];  Service: Orthopedics;  Laterality: Right;   TRANSFORAMINAL LUMBAR INTERBODY FUSION (TLIF) WITH PEDICLE SCREW FIXATION 1 LEVEL Left 08/16/2020   Procedure: Left Lumbar two-three Transforaminal lumbar interbody fusion with exploration and extension of adjacent level fusion;  Surgeon: Erline Levine, MD;  Location: [place];  Service: Neurosurgery;  Laterality: Left;   Patient Active Problem List   Diagnosis Date Noted   Degenerative lumbar spinal stenosis 08/16/2020   Acute blood loss as cause of postoperative anemia 04/03/2016   Class 3 obesity due to excess calories with body mass index (BMI) of 40.0 to 44.9 in adult    Primary localized osteoarthritis of right knee    CKD (chronic kidney disease), stage III ([place]) 10/22/2015   Syncope 10/21/2015   Non-insulin dependent type 2 diabetes mellitus ([place]) 10/21/2015   Hypothyroidism 10/21/2015   Lumbar stenosis with neurogenic claudication 08/16/2014   Right rotator cuff tear     Anxiety    PONV (postoperative nausea and vomiting)    GERD (gastroesophageal reflux disease)    Arthritis    Thyroid disease    DIVERTICULOSIS-COLON 07/04/2009   Iron deficiency anemia 03/17/2009   PERSONAL HX COLONIC POLYPS 03/17/2009   SKIN RASH 02/23/2009   DIARRHEA 02/23/2009   FEVER, HX OF 02/23/2009   THYROID NODULE, LEFT 02/11/2009   Glaucoma 02/11/2009   Coronary atherosclerosis 02/11/2009   WOLFF (WOLFE)-PARKINSON-WHITE (WPW) SYNDROME 02/11/2009   DEGENERATIVE JOINT DISEASE, ANKLE 02/11/2009   Osteoarthritis of spine 02/11/2009   Essential hypertension 02/09/2009     PCP: Donnajean Lopes, MD   REFERRING PROVIDER: Donnajean Lopes, MD   REFERRING DIAG: R26.81 (ICD-10-CM) - Unsteadiness on feet   THERAPY DIAG:  Unsteadiness on feet   Muscle weakness (generalized)   Other low back pain   ONSET DATE: 4-5 months ago   SUBJECTIVE:    SUBJECTIVE STATEMENT: "Busy weekend a little soar this am"   PERTINENT HISTORY: R TKA 2018, Lumbar fusion all 5. Left ankle OA   PAIN:  Are you having pain? No: NPRS scale: 3-4/10 Pain location: LB Pain description:  Aggravating factors: upright position/walking Relieving factors: slumping forward   PRECAUTIONS: Back and Fall   WEIGHT BEARING RESTRICTIONS No   FALLS:  Has patient fallen in last 6 months? Yes. Number of falls 2   LIVING ENVIRONMENT: Lives with: lives alone Lives in: House/apartment Stairs: Yes; 2 steps to enter. Level home  Has following equipment at home: Single point cane OCCUPATION: retired   PLOF: [place] stand in kitchen and cook, laundry, make my bed better, stand upright with less pain, be stronger     OBJECTIVE:    PATIENT SURVEYS:  FOTO 31  goal 48 09/18/21: 44   COGNITION:           Overall cognitive status: Within functional limits for tasks assessed                          SENSATION: WFL     POSTURE:  Forward flex , rounded shoulder    PALPATION: Moderately sensitive to palpation throughout lumbar paraspinals     Lumbar ROM Limited secondary to pain Rotation and side bending limited by 50% R>L Extension 0; flex 30 degrees     LE MMT:   MMT Right 08/30/2021 Left 08/30/2021  Hip flexion 3- 3  Hip extension 3- 3  Hip abduction  Hip adduction      Hip internal rotation      Hip external rotation      Knee flexion 3 4  Knee extension 4 5  Ankle dorsiflexion      Ankle plantarflexion      Ankle inversion      Ankle eversion       (Blank rows = not tested)     FUNCTIONAL TESTS:  TUG 29 30s STS 2 6MWT: 392 ft Berg balance:18/56 (High fall risk)  BERG Balance Test         Date: 08/30/21  Standing unsupported 3  Sitting with back unsupported but feet supported 4  Stand to sit 2  Transfers  1  Standing unsupported with eyes closed 2  Standing unsupported feet together 2  From standing position, reach forward with outstretched arm 0  From standing position, pick up object from floor 0  From standing position, turn and look behind over each shoulder 1  Turn 360 1  Standing unsupported, alternately place foot on step 0  Standing unsupported, one foot in front 0  Standing on one leg 0  Total:  18      GAIT: Distance walked: 500 )to pool Assistive device utilized: none.  (Needs SPC for safety) Level of assistance: SBA Comments: Antalgic: cadence slowed, decreased step length, forward hip flex, dragging heels (decreased knee and hip flex during swing)       TODAY'S TREATMENT: Pt seen for aquatic therapy today.  Treatment took place in water 3.25-4 ft in depth at the [facility] pool. Temp of water was 90.  Pt entered/exited the pool via stairs independently with bilat (step to pattern) rail.  Foto completed  Warm up of multiple laps each - forward, backward, and side stepping holding yellow noodle.   Seated Bench Lb stretching in flex then R/L holding x 20s x 4 ex STS onto step  x10 Step ups R/L leading x10 with decreasing ue support as able holding to noodle Alternating foot taps to blue step x 10 no ue support  Standing 4 ft:  Supported by yellow noodle: tr, hr, marching, add/abd 2x10 reps. Cues for core stability SLS holding yellow noodle, 10 sec x 3 each LE Walking 4 widths each with head movement R/L then vertically Squoodle pushdown (core engagement) 2x10. Cues for abdominal bracing and glute squeeze throughout  LB stretching in jack knife position holding to hand rails of step 3x 20 s then hip hiking R/L holding x 20s. Backward and side stepping throughout for recovery (with focus on lateral and posterior chain) walking with yellow noodle    Pt requires buoyancy for support and to offload joints with strengthening exercises. Viscosity of the water is needed for resistance of strengthening; water current perturbations provides challenge to standing balance unsupported, requiring increased core activation.     PATIENT EDUCATION:  Education details: see treatment Person educated: Patient Education method: Explanation Education comprehension: verbalized understanding     HOME EXERCISE PROGRAM: [id number] from last episode   ASSESSMENT:   CLINICAL IMPRESSION: Improvement on FOTO from 75 to 72 demonstrating improved perception of functional ability. Progressing balance challenges which pt is tolerating well. Note: pt with LOB completing vertical head movement with forward amb. Less difficulty with lateral head movements. She reports getting around with improvement over the weekend at church function.  Was able to enjoy it.  Goals ongoing          OBJECTIVE IMPAIRMENTS decreased activity tolerance, decreased balance,  decreased endurance, decreased mobility, difficulty walking, decreased strength, and pain.    ACTIVITY LIMITATIONS cleaning, community activity, laundry, and shopping.    PERSONAL FACTORS Age, Fitness, and 1-2 comorbidities: OA, lumbar  fusions  are also affecting patient's functional outcome.      REHAB POTENTIAL: Good   CLINICAL DECISION MAKING: Evolving/moderate complexity   EVALUATION COMPLEXITY: Moderate     GOALS: Goals reviewed with patient? Yes   SHORT TERM GOALS: Target date: 09/27/2021   Pt will make bed, changing linen in less than 15 minutes Baseline:1 hour Goal status: INITIAL   2.  Berg balance score to improve to 28/56 to demonstrate improving balance decreasing fall risk Baseline: 18/56 Goal status: INITIAL   3.  Tug score will improve to less than 20s to demonstrate improving functional mobility Baseline: 29s Goal status: INITIAL       LONG TERM GOALS: Target date: 11/08/2021   Pt will be able to stand (and cook) for up >15 minutes Baseline:  Goal status: INITIAL   2.  Pt will return to water aerobics with [place] [facility] attending 1-2x weekly demonstrating return to PLOF @ DC last episode Baseline:  Goal status: INITIAL   3.  Foto to meet stated goal (54) Baseline: 34 Goal status: INITIAL   4.  Hip strength to improve >or= 1 grade Baseline: 3- to 3/5 Goal status: INITIAL   5.  Pt will improve on Berg balance to 35/56 or > to demonstrate decreased fall risk Baseline:18/56  Goal status: INITIAL     PT FREQUENCY: 1-2x/week   PT DURATION: 10 weeks   PLANNED INTERVENTIONS: Therapeutic exercises, Therapeutic activity, Neuromuscular re-education, Balance training, Gait training, Patient/Family education, and Joint mobilization   PLAN FOR NEXT SESSION:  Aquatics for core and LE strengthening, aerobic capacity and balance retraining.    Dura Mccormack (Frankie) Aydan Phoenix MPT 09/18/21 9:00 AM

## 2021-09-20 ENCOUNTER — Ambulatory Visit (HOSPITAL_BASED_OUTPATIENT_CLINIC_OR_DEPARTMENT_OTHER): Payer: Medicare Other | Admitting: Physical Therapy

## 2021-09-20 ENCOUNTER — Encounter (HOSPITAL_BASED_OUTPATIENT_CLINIC_OR_DEPARTMENT_OTHER): Payer: Self-pay | Admitting: Physical Therapy

## 2021-09-20 DIAGNOSIS — R2681 Unsteadiness on feet: Secondary | ICD-10-CM

## 2021-09-20 DIAGNOSIS — M6281 Muscle weakness (generalized): Secondary | ICD-10-CM

## 2021-09-20 DIAGNOSIS — M5459 Other low back pain: Secondary | ICD-10-CM

## 2021-09-20 NOTE — Therapy (Signed)
OUTPATIENT PHYSICAL THERAPY TREATMENT NOTE   Patient Name: [name] MRN: [id number] DOB:10/08/1945, 76 y.o., female Today's Date: 09/20/2021    PT End of Session - 09/20/21 1128     Visit Number 8    Number of Visits 20    Date for PT Re-Evaluation 11/08/21    Authorization Type UHC MCR    Progress Note Due on Visit 10    PT Start Time 1115    PT Stop Time 1200    PT Time Calculation (min) 45 min    Activity Tolerance Patient tolerated treatment well    Behavior During Therapy [facility] for tasks assessed/performed             Past Medical History:  Diagnosis Date   Acute blood loss as cause of postoperative anemia 04/03/2016   Anemia    hx of   Anxiety    takes Xanax daily as needed   Arthritis    Bronchitis    a month ago   Chronic back pain    stenosis   Chronic kidney disease    stage 3-sees Dr.J Patel   Class 3 obesity due to excess calories with body mass index (BMI) of 40.0 to 44.9 in adult    Diabetes mellitus    type II; takes Byetta and Invokana daily   Glaucoma    uses Eye Drops   Gout    takes Uloric daily   History of blood transfusion    no abnormal reaction    History of colon polyps    Hyperlipidemia    no meds taken now   Hypertension    takes HCTZ daily as well as Aldactone   Hypothyroidism    takes Synthroid daily   Pneumonia    many yrs ago   PONV (postoperative nausea and vomiting)    Primary localized osteoarthritis of right knee    Stress incontinence    Syncope    Weakness    numbness and tingling in right hip/eg/   Wears glasses    Wolf-Parkinson-White syndrome    takes Atenolol daily   Past Surgical History:  Procedure Laterality Date   ABDOMINAL HYSTERECTOMY     APPENDECTOMY     BACK SURGERY  2006   lumb fusion   BACK SURGERY  08/16/2014   L3-4 fusion   BREAST BIOPSY     BREAST ENHANCEMENT SURGERY Bilateral    BREAST EXCISIONAL BIOPSY Left    benign   CARDIAC CATHETERIZATION  >16yr ago   CARPAL TUNNEL RELEASE  Bilateral    x4   cataract surgery Bilateral    CESAREAN SECTION     x 3   CHOLECYSTECTOMY  1981   COLONOSCOPY     KNEE ARTHROSCOPY Right 2009   rt   RADIOLOGY WITH ANESTHESIA N/A 05/12/2020   Procedure: MRI LUMBAR SPINE WITH AND WITHOUT CONTRAST;  Surgeon: Radiologist, Medication, MD;  [place] [facility]  Service: Radiology;  Laterality: N/A;   RAZ procedure  10+yrs ago   REDUCTION MAMMAPLASTY Bilateral 2006   ROTATOR CUFF REPAIR Right    x 4   SHOULDER ARTHROSCOPY WITH ROTATOR CUFF REPAIR AND SUBACROMIAL DECOMPRESSION Right 06/10/2012   Procedure: SHOULDER ARTHROSCOPY WITH ROTATOR CUFF REPAIR AND SUBACROMIAL DECOMPRESSION;  Surgeon: RLorn Junes MD;  [place] [facility]  Service: Orthopedics;  Laterality: Right;  RIGHT SHOULDER ARTHROSCOPY, DECOMPRESSION SUBACROMIAL PARTIAL ACROMIOPLASTY WITH CORACOACROMIAL RELEASE,, DISTAL CLAVICULECTOMY , ROTATOR CUFF debriedment   SKIN BIOPSY  removed moles all over body   THYROID SURGERY Right    right thyroid removed   TOTAL KNEE ARTHROPLASTY Right 04/02/2016   Procedure: RIGHT TOTAL KNEE ARTHROPLASTY;  Surgeon: Elsie Saas, MD;  Location: [place];  Service: Orthopedics;  Laterality: Right;   TRANSFORAMINAL LUMBAR INTERBODY FUSION (TLIF) WITH PEDICLE SCREW FIXATION 1 LEVEL Left 08/16/2020   Procedure: Left Lumbar two-three Transforaminal lumbar interbody fusion with exploration and extension of adjacent level fusion;  Surgeon: Erline Levine, MD;  Location: [place];  Service: Neurosurgery;  Laterality: Left;   Patient Active Problem List   Diagnosis Date Noted   Degenerative lumbar spinal stenosis 08/16/2020   Acute blood loss as cause of postoperative anemia 04/03/2016   Class 3 obesity due to excess calories with body mass index (BMI) of 40.0 to 44.9 in adult    Primary localized osteoarthritis of right knee    CKD (chronic kidney disease), stage III ([place]) 10/22/2015   Syncope 10/21/2015   Non-insulin dependent type 2  diabetes mellitus ([place]) 10/21/2015   Hypothyroidism 10/21/2015   Lumbar stenosis with neurogenic claudication 08/16/2014   Right rotator cuff tear    Anxiety    PONV (postoperative nausea and vomiting)    GERD (gastroesophageal reflux disease)    Arthritis    Thyroid disease    DIVERTICULOSIS-COLON 07/04/2009   Iron deficiency anemia 03/17/2009   PERSONAL HX COLONIC POLYPS 03/17/2009   SKIN RASH 02/23/2009   DIARRHEA 02/23/2009   FEVER, HX OF 02/23/2009   THYROID NODULE, LEFT 02/11/2009   Glaucoma 02/11/2009   Coronary atherosclerosis 02/11/2009   WOLFF (WOLFE)-PARKINSON-WHITE (WPW) SYNDROME 02/11/2009   DEGENERATIVE JOINT DISEASE, ANKLE 02/11/2009   Osteoarthritis of spine 02/11/2009   Essential hypertension 02/09/2009     PCP: Donnajean Lopes, MD   REFERRING PROVIDER: Donnajean Lopes, MD   REFERRING DIAG: R26.81 (ICD-10-CM) - Unsteadiness on feet   THERAPY DIAG:  Unsteadiness on feet   Muscle weakness (generalized)   Other low back pain   ONSET DATE: 4-5 months ago   SUBJECTIVE:    SUBJECTIVE STATEMENT: "No pain today"   PERTINENT HISTORY: R TKA 2018, Lumbar fusion all 5. Left ankle OA   PAIN:  Are you having pain? No: NPRS scale: 0/10 Pain location: LB Pain description:  Aggravating factors: upright position/walking Relieving factors: slumping forward   PRECAUTIONS: Back and Fall   WEIGHT BEARING RESTRICTIONS No   FALLS:  Has patient fallen in last 6 months? Yes. Number of falls 2   LIVING ENVIRONMENT: Lives with: lives alone Lives in: House/apartment Stairs: Yes; 2 steps to enter. Level home  Has following equipment at home: Single point cane OCCUPATION: retired   PLOF: [place] stand in kitchen and cook, laundry, make my bed better, stand upright with less pain, be stronger     OBJECTIVE:    PATIENT SURVEYS:  FOTO 31  goal 48 09/18/21: 44   COGNITION:           Overall cognitive status: Within functional  limits for tasks assessed                          SENSATION: WFL     POSTURE:  Forward flex , rounded shoulder   PALPATION: Moderately sensitive to palpation throughout lumbar paraspinals     Lumbar ROM Limited secondary to pain Rotation and side bending limited by 50% R>L Extension 0; flex 30 degrees     LE  MMT:   MMT Right 08/30/2021 Left 08/30/2021  Hip flexion 3- 3  Hip extension 3- 3  Hip abduction      Hip adduction      Hip internal rotation      Hip external rotation      Knee flexion 3 4  Knee extension 4 5  Ankle dorsiflexion      Ankle plantarflexion      Ankle inversion      Ankle eversion       (Blank rows = not tested)     FUNCTIONAL TESTS:  TUG 29 30s STS 2 6MWT: 392 ft Berg balance:18/56 (High fall risk)  BERG Balance Test         Date: 08/30/21  Standing unsupported 3  Sitting with back unsupported but feet supported 4  Stand to sit 2  Transfers  1  Standing unsupported with eyes closed 2  Standing unsupported feet together 2  From standing position, reach forward with outstretched arm 0  From standing position, pick up object from floor 0  From standing position, turn and look behind over each shoulder 1  Turn 360 1  Standing unsupported, alternately place foot on step 0  Standing unsupported, one foot in front 0  Standing on one leg 0  Total:  18      GAIT: Distance walked: 500 )to pool Assistive device utilized: none.  (Needs SPC for safety) Level of assistance: SBA Comments: Antalgic: cadence slowed, decreased step length, forward hip flex, dragging heels (decreased knee and hip flex during swing)       TODAY'S TREATMENT: Pt seen for aquatic therapy today.  Treatment took place in water 3.25-4 ft in depth at the [facility] pool. Temp of water was 90.  Pt entered/exited the pool via stairs independently with bilat (step to pattern) rail.  Foto completed  Warm up of multiple laps each - forward, backward, and side  stepping holding yellow noodle.   Seated STS 2x10  Standing 71f:  LB stretching in jack knife position holding to hand rails of step 3x 20 s then hip hiking R/L holding x 20s. -hip hinges x10 using kick board -lumbar rotation R/L x10 using kick board 3.612f-kick board push pull 2x15 -Blue noodle pushdown (core engagement) 2x10.  Step tapping bottom step x 10 R/L Step ups R/L x10 cues for balance/gazing forward, (not looking at feet)  Walking 2 widths each with head movement R/L then vertically ue supported by yellow noodle  Backward and side stepping throughout for recovery (with focus on lateral and posterior chain) walking with yellow noodle    Pt requires buoyancy for support and to offload joints with strengthening exercises. Viscosity of the water is needed for resistance of strengthening; water current perturbations provides challenge to standing balance unsupported, requiring increased core activation.     PATIENT EDUCATION:  Education details: see treatment Person educated: Patient Education method: Explanation Education comprehension: verbalized understanding     HOME EXERCISE PROGRAM: [id number] from last episode   ASSESSMENT:   CLINICAL IMPRESSION: Pt taking initiative to enter pool and warm up following program which we have been completing. She is progressing well with toleration of activity 10-15 min early for session warming herself up then participating in 40 minutes of therapy. She reports overall reduction of pain today to 0/10. Continues to have some LOB with forward amb and vertical head movement, but pt reports feeling more confident.     OBJECTIVE IMPAIRMENTS decreased activity tolerance, decreased  balance, decreased endurance, decreased mobility, difficulty walking, decreased strength, and pain.    ACTIVITY LIMITATIONS cleaning, community activity, laundry, and shopping.    PERSONAL FACTORS Age, Fitness, and 1-2 comorbidities: OA, lumbar fusions  are  also affecting patient's functional outcome.      REHAB POTENTIAL: Good   CLINICAL DECISION MAKING: Evolving/moderate complexity   EVALUATION COMPLEXITY: Moderate     GOALS: Goals reviewed with patient? Yes   SHORT TERM GOALS: Target date: 09/27/2021   Pt will make bed, changing linen in less than 15 minutes Baseline:1 hour Goal status: INITIAL   2.  Berg balance score to improve to 28/56 to demonstrate improving balance decreasing fall risk Baseline: 18/56 Goal status: INITIAL   3.  Tug score will improve to less than 20s to demonstrate improving functional mobility Baseline: 29s Goal status: INITIAL       LONG TERM GOALS: Target date: 11/08/2021   Pt will be able to stand (and cook) for up >15 minutes Baseline:  Goal status: INITIAL   2.  Pt will return to water aerobics with [place] wellness attending 1-2x weekly demonstrating return to PLOF @ DC last episode Baseline:  Goal status: INITIAL   3.  Foto to meet stated goal (54) Baseline: 34 Goal status: INITIAL   4.  Hip strength to improve >or= 1 grade Baseline: 3- to 3/5 Goal status: INITIAL   5.  Pt will improve on Berg balance to 35/56 or > to demonstrate decreased fall risk Baseline:18/56  Goal status: INITIAL     PT FREQUENCY: 1-2x/week   PT DURATION: 10 weeks   PLANNED INTERVENTIONS: Therapeutic exercises, Therapeutic activity, Neuromuscular re-education, Balance training, Gait training, Patient/Family education, and Joint mobilization   PLAN FOR NEXT SESSION:  Aquatics for core and LE strengthening, aerobic capacity and balance retraining.    Stanton Kidney Tharon Aquas) Shawnna Pancake MPT 09/20/21 11:31 AM

## 2021-09-26 ENCOUNTER — Encounter (HOSPITAL_BASED_OUTPATIENT_CLINIC_OR_DEPARTMENT_OTHER): Payer: Self-pay | Admitting: Physical Therapy

## 2021-09-26 ENCOUNTER — Other Ambulatory Visit (HOSPITAL_COMMUNITY): Payer: Self-pay

## 2021-09-26 ENCOUNTER — Ambulatory Visit (HOSPITAL_BASED_OUTPATIENT_CLINIC_OR_DEPARTMENT_OTHER): Payer: Medicare Other | Admitting: Physical Therapy

## 2021-09-26 DIAGNOSIS — M5459 Other low back pain: Secondary | ICD-10-CM

## 2021-09-26 DIAGNOSIS — M6281 Muscle weakness (generalized): Secondary | ICD-10-CM

## 2021-09-26 DIAGNOSIS — R2681 Unsteadiness on feet: Secondary | ICD-10-CM

## 2021-09-26 NOTE — Therapy (Signed)
OUTPATIENT PHYSICAL THERAPY TREATMENT NOTE   Patient Name: [name] MRN: [id number] DOB:1945-06-11, 76 y.o., female Today's Date: 09/26/2021    PT End of Session - 09/26/21 1030     Visit Number 9    Number of Visits 20    Date for PT Re-Evaluation 11/08/21    Authorization Type UHC MCR    Progress Note Due on Visit 10    PT Start Time [phone]    PT Stop Time 1030    PT Time Calculation (min) 44 min    Activity Tolerance Patient tolerated treatment well    Behavior During Therapy [facility] for tasks assessed/performed              Past Medical History:  Diagnosis Date   Acute blood loss as cause of postoperative anemia 04/03/2016   Anemia    hx of   Anxiety    takes Xanax daily as needed   Arthritis    Bronchitis    a month ago   Chronic back pain    stenosis   Chronic kidney disease    stage 3-sees Dr.J Patel   Class 3 obesity due to excess calories with body mass index (BMI) of 40.0 to 44.9 in adult    Diabetes mellitus    type II; takes Byetta and Invokana daily   Glaucoma    uses Eye Drops   Gout    takes Uloric daily   History of blood transfusion    no abnormal reaction    History of colon polyps    Hyperlipidemia    no meds taken now   Hypertension    takes HCTZ daily as well as Aldactone   Hypothyroidism    takes Synthroid daily   Pneumonia    many yrs ago   PONV (postoperative nausea and vomiting)    Primary localized osteoarthritis of right knee    Stress incontinence    Syncope    Weakness    numbness and tingling in right hip/eg/   Wears glasses    Wolf-Parkinson-White syndrome    takes Atenolol daily   Past Surgical History:  Procedure Laterality Date   ABDOMINAL HYSTERECTOMY     APPENDECTOMY     BACK SURGERY  2006   lumb fusion   BACK SURGERY  08/16/2014   L3-4 fusion   BREAST BIOPSY     BREAST ENHANCEMENT SURGERY Bilateral    BREAST EXCISIONAL BIOPSY Left    benign   CARDIAC CATHETERIZATION  >81yr ago   CARPAL TUNNEL  RELEASE Bilateral    x4   cataract surgery Bilateral    CESAREAN SECTION     x 3   CHOLECYSTECTOMY  1981   COLONOSCOPY     KNEE ARTHROSCOPY Right 2009   rt   RADIOLOGY WITH ANESTHESIA N/A 05/12/2020   Procedure: MRI LUMBAR SPINE WITH AND WITHOUT CONTRAST;  Surgeon: Radiologist, Medication, MD;  [place] [facility]  Service: Radiology;  Laterality: N/A;   RAZ procedure  10+yrs ago   REDUCTION MAMMAPLASTY Bilateral 2006   ROTATOR CUFF REPAIR Right    x 4   SHOULDER ARTHROSCOPY WITH ROTATOR CUFF REPAIR AND SUBACROMIAL DECOMPRESSION Right 06/10/2012   Procedure: SHOULDER ARTHROSCOPY WITH ROTATOR CUFF REPAIR AND SUBACROMIAL DECOMPRESSION;  Surgeon: RLorn Junes MD;  [place] [facility]  Service: Orthopedics;  Laterality: Right;  RIGHT SHOULDER ARTHROSCOPY, DECOMPRESSION SUBACROMIAL PARTIAL ACROMIOPLASTY WITH CORACOACROMIAL RELEASE,, DISTAL CLAVICULECTOMY , ROTATOR CUFF debriedment   SKIN BIOPSY  removed moles all over body   THYROID SURGERY Right    right thyroid removed   TOTAL KNEE ARTHROPLASTY Right 04/02/2016   Procedure: RIGHT TOTAL KNEE ARTHROPLASTY;  Surgeon: Elsie Saas, MD;  Location: [place];  Service: Orthopedics;  Laterality: Right;   TRANSFORAMINAL LUMBAR INTERBODY FUSION (TLIF) WITH PEDICLE SCREW FIXATION 1 LEVEL Left 08/16/2020   Procedure: Left Lumbar two-three Transforaminal lumbar interbody fusion with exploration and extension of adjacent level fusion;  Surgeon: Erline Levine, MD;  Location: [place];  Service: Neurosurgery;  Laterality: Left;   Patient Active Problem List   Diagnosis Date Noted   Degenerative lumbar spinal stenosis 08/16/2020   Acute blood loss as cause of postoperative anemia 04/03/2016   Class 3 obesity due to excess calories with body mass index (BMI) of 40.0 to 44.9 in adult    Primary localized osteoarthritis of right knee    CKD (chronic kidney disease), stage III ([place]) 10/22/2015   Syncope 10/21/2015   Non-insulin dependent type 2  diabetes mellitus ([place]) 10/21/2015   Hypothyroidism 10/21/2015   Lumbar stenosis with neurogenic claudication 08/16/2014   Right rotator cuff tear    Anxiety    PONV (postoperative nausea and vomiting)    GERD (gastroesophageal reflux disease)    Arthritis    Thyroid disease    DIVERTICULOSIS-COLON 07/04/2009   Iron deficiency anemia 03/17/2009   PERSONAL HX COLONIC POLYPS 03/17/2009   SKIN RASH 02/23/2009   DIARRHEA 02/23/2009   FEVER, HX OF 02/23/2009   THYROID NODULE, LEFT 02/11/2009   Glaucoma 02/11/2009   Coronary atherosclerosis 02/11/2009   WOLFF (WOLFE)-PARKINSON-WHITE (WPW) SYNDROME 02/11/2009   DEGENERATIVE JOINT DISEASE, ANKLE 02/11/2009   Osteoarthritis of spine 02/11/2009   Essential hypertension 02/09/2009     PCP: Donnajean Lopes, MD   REFERRING PROVIDER: Donnajean Lopes, MD   REFERRING DIAG: R26.81 (ICD-10-CM) - Unsteadiness on feet   THERAPY DIAG:  Unsteadiness on feet   Muscle weakness (generalized)   Other low back pain   ONSET DATE: 4-5 months ago   SUBJECTIVE:    SUBJECTIVE STATEMENT: "Did not have a good weekend, weather effects me"   PERTINENT HISTORY: R TKA 2018, Lumbar fusion all 5. Left ankle OA   PAIN:  Are you having pain? No: NPRS scale: 4/10 Pain location: LB Pain description:  Aggravating factors: upright position/walking Relieving factors: slumping forward   PRECAUTIONS: Back and Fall   WEIGHT BEARING RESTRICTIONS No   FALLS:  Has patient fallen in last 6 months? Yes. Number of falls 2   LIVING ENVIRONMENT: Lives with: lives alone Lives in: House/apartment Stairs: Yes; 2 steps to enter. Level home  Has following equipment at home: Single point cane OCCUPATION: retired   PLOF: [place] stand in kitchen and cook, laundry, make my bed better, stand upright with less pain, be stronger     OBJECTIVE:    PATIENT SURVEYS:  FOTO 31  goal 48 09/18/21: 44   COGNITION:           Overall  cognitive status: Within functional limits for tasks assessed                          SENSATION: WFL     POSTURE:  Forward flex , rounded shoulder   PALPATION: Moderately sensitive to palpation throughout lumbar paraspinals     Lumbar ROM Limited secondary to pain Rotation and side bending limited by 50% R>L Extension 0; flex 30  degrees     LE MMT:   MMT Right 08/30/2021 Left 08/30/2021  Hip flexion 3- 3  Hip extension 3- 3  Hip abduction      Hip adduction      Hip internal rotation      Hip external rotation      Knee flexion 3 4  Knee extension 4 5  Ankle dorsiflexion      Ankle plantarflexion      Ankle inversion      Ankle eversion       (Blank rows = not tested)     FUNCTIONAL TESTS:  TUG 29 30s STS 2 6MWT: 392 ft Berg balance:18/56 (High fall risk)  BERG Balance Test         Date: 08/30/21  Standing unsupported 3  Sitting with back unsupported but feet supported 4  Stand to sit 2  Transfers  1  Standing unsupported with eyes closed 2  Standing unsupported feet together 2  From standing position, reach forward with outstretched arm 0  From standing position, pick up object from floor 0  From standing position, turn and look behind over each shoulder 1  Turn 360 1  Standing unsupported, alternately place foot on step 0  Standing unsupported, one foot in front 0  Standing on one leg 0  Total:  18      GAIT: Distance walked: 500 )to pool Assistive device utilized: none.  (Needs SPC for safety) Level of assistance: SBA Comments: Antalgic: cadence slowed, decreased step length, forward hip flex, dragging heels (decreased knee and hip flex during swing)       TODAY'S TREATMENT: Pt seen for aquatic therapy today.  Treatment took place in water 3.25-4 ft in depth at the [facility] pool. Temp of water was 90.  Pt entered/exited the pool via stairs independently with bilat (step to pattern) rail.   Warm up of multiple laps each - forward,  backward, and side stepping holding yellow noodle.   Seated [place] kicking; add/abd 3x20-25 STS 2x10 3rd (bottom) step. Cues for core tightness  Standing 32f:  LB stretching in jack knife position holding to hand rails of step 3x 20 s then hip hiking R/L holding x 20s. Step tapping bottom step x 10 R/L then 2nd step x10 Step ups R/L x10 cues for balance. Pt gazing forward without cues. -Blue noodle pushdown (core engagement) 2x10.   -hip hinges x10 using kick board -lumbar rotation R/L x10 using kick board 3.616f-kick board push pull 2x15 Backward and forward walking throughout for recovery walking with yellow hand buoys   Pt requires buoyancy for support and to offload joints with strengthening exercises. Viscosity of the water is needed for resistance of strengthening; water current perturbations provides challenge to standing balance unsupported, requiring increased core activation.     PATIENT EDUCATION:  Education details: see treatment Person educated: Patient Education method: Explanation Education comprehension: verbalized understanding     HOME EXERCISE PROGRAM: [id number] from last episode   ASSESSMENT:   CLINICAL IMPRESSION: Some increase in pain over weekend, feeling better this am although states she is tired.  Pt directed through exercises requiring minor cues for execution. Reminders for abdominal bracing.  Tolerates well. Goals ongoing.      OBJECTIVE IMPAIRMENTS decreased activity tolerance, decreased balance, decreased endurance, decreased mobility, difficulty walking, decreased strength, and pain.    ACTIVITY LIMITATIONS cleaning, community activity, laundry, and shopping.    PERSONAL FACTORS Age, Fitness, and 1-2 comorbidities: OA, lumbar fusions  are also affecting patient's functional outcome.      REHAB POTENTIAL: Good   CLINICAL DECISION MAKING: Evolving/moderate complexity   EVALUATION COMPLEXITY: Moderate     GOALS: Goals reviewed with  patient? Yes   SHORT TERM GOALS: Target date: 09/27/2021   Pt will make bed, changing linen in less than 15 minutes Baseline:1 hour Goal status: INITIAL   2.  Berg balance score to improve to 28/56 to demonstrate improving balance decreasing fall risk Baseline: 18/56 Goal status: INITIAL   3.  Tug score will improve to less than 20s to demonstrate improving functional mobility Baseline: 29s Goal status: INITIAL       LONG TERM GOALS: Target date: 11/08/2021   Pt will be able to stand (and cook) for up >15 minutes Baseline:  Goal status: INITIAL   2.  Pt will return to water aerobics with [place] wellness attending 1-2x weekly demonstrating return to PLOF @ [place] last episode Baseline:  Goal status: INITIAL   3.  Foto to meet stated goal (54) Baseline: 34 Goal status: INITIAL   4.  Hip strength to improve >or= 1 grade Baseline: 3- to 3/5 Goal status: INITIAL   5.  Pt will improve on Berg balance to 35/56 or > to demonstrate decreased fall risk Baseline:18/56  Goal status: INITIAL     PT FREQUENCY: 1-2x/week   PT DURATION: 10 weeks   PLANNED INTERVENTIONS: Therapeutic exercises, Therapeutic activity, Neuromuscular re-education, Balance training, Gait training, Patient/Family education, and Joint mobilization   PLAN FOR NEXT SESSION:  Walking with head movement :Aquatics for core and LE strengthening, aerobic capacity and balance retraining.    Stanton Kidney Tharon Aquas) Detrich Rakestraw MPT 09/26/21 10:35 AM

## 2021-09-29 ENCOUNTER — Encounter (HOSPITAL_BASED_OUTPATIENT_CLINIC_OR_DEPARTMENT_OTHER): Payer: Self-pay | Admitting: Physical Therapy

## 2021-09-29 ENCOUNTER — Ambulatory Visit (HOSPITAL_BASED_OUTPATIENT_CLINIC_OR_DEPARTMENT_OTHER): Payer: Medicare Other | Attending: Internal Medicine | Admitting: Physical Therapy

## 2021-09-29 DIAGNOSIS — R2681 Unsteadiness on feet: Secondary | ICD-10-CM | POA: Diagnosis present

## 2021-09-29 DIAGNOSIS — M6281 Muscle weakness (generalized): Secondary | ICD-10-CM | POA: Diagnosis present

## 2021-09-29 DIAGNOSIS — G8929 Other chronic pain: Secondary | ICD-10-CM | POA: Insufficient documentation

## 2021-09-29 DIAGNOSIS — M25561 Pain in right knee: Secondary | ICD-10-CM | POA: Diagnosis present

## 2021-09-29 DIAGNOSIS — M5459 Other low back pain: Secondary | ICD-10-CM | POA: Diagnosis present

## 2021-09-29 NOTE — Therapy (Signed)
OUTPATIENT PHYSICAL THERAPY TREATMENT NOTE  Progress Note Reporting Period 09/26/21 to 09/29/21  See note below for Objective Data and Assessment of Progress/Goals.     Patient Name: [name] MRN: [id number] DOB:1946/02/12, 76 y.o., female Today's Date: 09/29/2021       Past Medical History:  Diagnosis Date   Acute blood loss as cause of postoperative anemia 04/03/2016   Anemia    hx of   Anxiety    takes Xanax daily as needed   Arthritis    Bronchitis    a month ago   Chronic back pain    stenosis   Chronic kidney disease    stage 3-sees Dr.J Patel   Class 3 obesity due to excess calories with body mass index (BMI) of 40.0 to 44.9 in adult    Diabetes mellitus    type II; takes Byetta and Invokana daily   Glaucoma    uses Eye Drops   Gout    takes Uloric daily   History of blood transfusion    no abnormal reaction    History of colon polyps    Hyperlipidemia    no meds taken now   Hypertension    takes HCTZ daily as well as Aldactone   Hypothyroidism    takes Synthroid daily   Pneumonia    many yrs ago   PONV (postoperative nausea and vomiting)    Primary localized osteoarthritis of right knee    Stress incontinence    Syncope    Weakness    numbness and tingling in right hip/eg/   Wears glasses    Wolf-Parkinson-White syndrome    takes Atenolol daily   Past Surgical History:  Procedure Laterality Date   ABDOMINAL HYSTERECTOMY     APPENDECTOMY     BACK SURGERY  2006   lumb fusion   BACK SURGERY  08/16/2014   L3-4 fusion   BREAST BIOPSY     BREAST ENHANCEMENT SURGERY Bilateral    BREAST EXCISIONAL BIOPSY Left    benign   CARDIAC CATHETERIZATION  >56yr ago   CARPAL TUNNEL RELEASE Bilateral    x4   cataract surgery Bilateral    CESAREAN SECTION     x 3   CHOLECYSTECTOMY  1981   COLONOSCOPY     KNEE ARTHROSCOPY Right 2009   rt   RADIOLOGY WITH ANESTHESIA N/A 05/12/2020   Procedure: MRI LUMBAR SPINE WITH AND WITHOUT CONTRAST;  Surgeon:  Radiologist, Medication, MD;  [place] [facility]  Service: Radiology;  Laterality: N/A;   RAZ procedure  10+yrs ago   REDUCTION MAMMAPLASTY Bilateral 2006   ROTATOR CUFF REPAIR Right    x 4   SHOULDER ARTHROSCOPY WITH ROTATOR CUFF REPAIR AND SUBACROMIAL DECOMPRESSION Right 06/10/2012   Procedure: SHOULDER ARTHROSCOPY WITH ROTATOR CUFF REPAIR AND SUBACROMIAL DECOMPRESSION;  Surgeon: RLorn Junes MD;  [place] [facility]  Service: Orthopedics;  Laterality: Right;  RIGHT SHOULDER ARTHROSCOPY, DECOMPRESSION SUBACROMIAL PARTIAL ACROMIOPLASTY WITH CORACOACROMIAL RELEASE,, DISTAL CLAVICULECTOMY , ROTATOR CUFF debriedment   SKIN BIOPSY     removed moles all over body   THYROID SURGERY Right    right thyroid removed   TOTAL KNEE ARTHROPLASTY Right 04/02/2016   Procedure: RIGHT TOTAL KNEE ARTHROPLASTY;  Surgeon: RElsie Saas MD;  [place] [facility]  Service: Orthopedics;  Laterality: Right;   TRANSFORAMINAL LUMBAR INTERBODY FUSION (TLIF) WITH PEDICLE SCREW FIXATION 1 LEVEL Left 08/16/2020   Procedure: Left Lumbar two-three Transforaminal lumbar interbody fusion with exploration and extension of  adjacent level fusion;  Surgeon: Erline Levine, MD;  Location: [place];  Service: Neurosurgery;  Laterality: Left;   Patient Active Problem List   Diagnosis Date Noted   Degenerative lumbar spinal stenosis 08/16/2020   Acute blood loss as cause of postoperative anemia 04/03/2016   Class 3 obesity due to excess calories with body mass index (BMI) of 40.0 to 44.9 in adult    Primary localized osteoarthritis of right knee    CKD (chronic kidney disease), stage III ([place]) 10/22/2015   Syncope 10/21/2015   Non-insulin dependent type 2 diabetes mellitus ([place]) 10/21/2015   Hypothyroidism 10/21/2015   Lumbar stenosis with neurogenic claudication 08/16/2014   Right rotator cuff tear    Anxiety    PONV (postoperative nausea and vomiting)    GERD (gastroesophageal reflux disease)    Arthritis     Thyroid disease    DIVERTICULOSIS-COLON 07/04/2009   Iron deficiency anemia 03/17/2009   PERSONAL HX COLONIC POLYPS 03/17/2009   SKIN RASH 02/23/2009   DIARRHEA 02/23/2009   FEVER, HX OF 02/23/2009   THYROID NODULE, LEFT 02/11/2009   Glaucoma 02/11/2009   Coronary atherosclerosis 02/11/2009   WOLFF (WOLFE)-PARKINSON-WHITE (WPW) SYNDROME 02/11/2009   DEGENERATIVE JOINT DISEASE, ANKLE 02/11/2009   Osteoarthritis of spine 02/11/2009   Essential hypertension 02/09/2009     PCP: Donnajean Lopes, MD   REFERRING PROVIDER: Donnajean Lopes, MD   REFERRING DIAG: R26.81 (ICD-10-CM) - Unsteadiness on feet   THERAPY DIAG:  Unsteadiness on feet   Muscle weakness (generalized)   Other low back pain   ONSET DATE: 4-5 months ago   SUBJECTIVE:    SUBJECTIVE STATEMENT: "Not hurting just tires/fatigued.  Busy day yesterday"   PERTINENT HISTORY: R TKA 2018, Lumbar fusion all 5. Left ankle OA   PAIN:  Are you having pain? No: Current 0/10 NPRS scale: 4/10 Pain location: LB Pain description:  Aggravating factors: upright position/walking Relieving factors: slumping forward   PRECAUTIONS: Back and Fall   WEIGHT BEARING RESTRICTIONS No   FALLS:  Has patient fallen in last 6 months? Yes. Number of falls 2   LIVING ENVIRONMENT: Lives with: lives alone Lives in: House/apartment Stairs: Yes; 2 steps to enter. Level home  Has following equipment at home: Single point cane OCCUPATION: retired   PLOF: [place] stand in kitchen and cook, laundry, make my bed better, stand upright with less pain, be stronger     OBJECTIVE:    PATIENT SURVEYS:  FOTO 31  goal 48 09/18/21: 44   COGNITION:           Overall cognitive status: Within functional limits for tasks assessed                          SENSATION: WFL     POSTURE:  Forward flex , rounded shoulder   PALPATION: Moderately sensitive to palpation throughout lumbar paraspinals     Lumbar  ROM Limited secondary to pain Rotation and side bending limited by 50% R>L Extension 0; flex 30 degrees     LE MMT:   MMT Right 08/30/2021 Left 08/30/2021 Right 09/29/21   Hip flexion 3- 3 3 3+  Hip extension 3- 3    Hip abduction        Hip adduction        Hip internal rotation        Hip external rotation        Knee flexion 3 4  3+ 4  Knee extension '4 5 4 5  '$ Ankle dorsiflexion        Ankle plantarflexion        Ankle inversion        Ankle eversion         (Blank rows = not tested)     FUNCTIONAL TESTS:  TUG 29  09/29/21: 21s 30s STS 2 6MWT: 392 ft  09/29/21: 502 ft Berg balance:18/56 (High fall risk)  BERG Balance Test         Date: 08/30/21  Standing unsupported 3 4  Sitting with back unsupported but feet supported 4 4  Stand to sit 2 3  Transfers  1 3  Standing unsupported with eyes closed 2 3  Standing unsupported feet together 2 3  From standing position, reach forward with outstretched arm 0 1  From standing position, pick up object from floor 0 1  From standing position, turn and look behind over each shoulder 1 1  Turn 360 1 2  Standing unsupported, alternately place foot on step 0 2  Standing unsupported, one foot in front 0 0  Standing on one leg 0 0  Total:  18 30      GAIT: Distance walked: 500 )to pool Assistive device utilized: none.  (Needs SPC for safety) Level of assistance: SBA Comments: Antalgic: cadence slowed, decreased step length, forward hip flex, dragging heels (decreased knee and hip flex during swing)       TODAY'S TREATMENT: Pt seen for aquatic therapy today.  Treatment took place in water 3.25-4 ft in depth at the [facility] pool. Temp of water was 90.  Pt entered/exited the pool via stairs independently with bilat (step to pattern) rail.   Warm up of multiple laps each - forward, backward, and side stepping holding yellow noodle.   Seated [place] kicking; add/abd 3x20-25  Standing 2f:  LB stretching in jack knife  position holding to hand rails of step 3x 20 s then hip hiking R/L holding x 20s. Step tapping bottom step x 10 R/L then 2nd step x10 hip hinges x10 using kick board -lumbar rotation R/L x10 using kick board 3.668f-kick board push pull 2x15    Pt requires buoyancy for support and to offload joints with strengthening exercises. Viscosity of the water is needed for resistance of strengthening; water current perturbations provides challenge to standing balance unsupported, requiring increased core activation.   "On land" Balance training Gait training Objective testing     PATIENT EDUCATION:  Education details: see treatment Person educated: Patient Education method: Explanation Education comprehension: verbalized understanding     HOME EXERCISE PROGRAM: [id number] from last episode   ASSESSMENT:   CLINICAL IMPRESSION: Progress: Pt demonstrates improvement in all areas. Functionally She has had an overall decrease in lbp. She tolerates standing 15-20 minutes cooking or folding clothes and ihas decreased her time that it takes to make her bed. Objective testing: improvement in balance as per Berg from 18 to 30/56 decreasing her fall risk. TUG reduced to 21 s from 30 and 6MWT to 500 ft almost doubled.       She will continue to benefit from skilled aquatic physical therapy to improve functional mobility, decrease fall risk and improve quality of life.         ACTIVITY LIMITATIONS cleaning, community activity, laundry, and shopping.    PERSONAL FACTORS Age, Fitness, and 1-2 comorbidities: OA, lumbar fusions  are also affecting patient's functional outcome.  REHAB POTENTIAL: Good   CLINICAL DECISION MAKING: Evolving/moderate complexity   EVALUATION COMPLEXITY: Moderate     GOALS: Goals reviewed with patient? Yes   SHORT TERM GOALS: Target date: 09/27/2021   Pt will make bed, changing linen in less than 15 minutes Baseline:1 hour  30 mins 09/29/21 Goal status:  ongoing   2.  Berg balance score to improve to 28/56 to demonstrate improving balance decreasing fall risk Baseline: 18/56 Goal status: INITIAL   3.  Tug score will improve to less than 20s to demonstrate improving functional mobility Baseline: 29s Goal status: INITIAL       LONG TERM GOALS: Target date: 11/08/2021   Pt will be able to stand (and cook) for up >15 minutes Baseline:  Goal status: Achieved   2.  Pt will return to water aerobics with [place] wellness attending 1-2x weekly demonstrating return to PLOF @ DC last episode Baseline:  Goal status: INITIAL   3.  Foto to meet stated goal (54) Baseline: 34 Goal status: ongoing   4.  Hip strength to improve >or= 1 grade Baseline: 3- to 3/5 Goal status: INITIAL   5.  Pt will improve on Berg balance to 35/56 or > to demonstrate decreased fall risk Baseline:18/56  Goal status: INITIAL     PT FREQUENCY: 1-2x/week   PT DURATION: 10 weeks   PLANNED INTERVENTIONS: Therapeutic exercises, Therapeutic activity, Neuromuscular re-education, Balance training, Gait training, Patient/Family education, and Joint mobilization   PLAN FOR NEXT SESSION:  Walking with head movement :Aquatics for core and LE strengthening, aerobic capacity and balance retraining.    Stanton Kidney (Frankie) Jewelia Bocchino MPT 09/29/21 10:02 AM

## 2021-10-02 ENCOUNTER — Ambulatory Visit (HOSPITAL_BASED_OUTPATIENT_CLINIC_OR_DEPARTMENT_OTHER): Payer: Medicare Other | Admitting: Physical Therapy

## 2021-10-02 ENCOUNTER — Encounter (HOSPITAL_BASED_OUTPATIENT_CLINIC_OR_DEPARTMENT_OTHER): Payer: Self-pay | Admitting: Physical Therapy

## 2021-10-02 DIAGNOSIS — R2681 Unsteadiness on feet: Secondary | ICD-10-CM

## 2021-10-02 DIAGNOSIS — M5459 Other low back pain: Secondary | ICD-10-CM

## 2021-10-02 DIAGNOSIS — M6281 Muscle weakness (generalized): Secondary | ICD-10-CM

## 2021-10-02 NOTE — Therapy (Signed)
OUTPATIENT PHYSICAL THERAPY TREATMENT NOTE      Patient Name: [name] MRN: [id number] DOB:[birth year], 76 y.o., female Today's Date: 10/02/2021    PT End of Session - 10/02/21 0959     Visit Number 11    Number of Visits 20    Date for PT Re-Evaluation 11/08/21    Authorization Type U[employer] MCR    Progress Note Due on Visit 20    PT Start Time [phone]    PT Stop Time 1030    PT Time Calculation (min) 44 min    Activity Tolerance Patient tolerated treatment well    Behavior During Therapy [facility] for tasks assessed/performed               Past Medical History:  Diagnosis Date   Acute blood loss as cause of postoperative anemia 04/03/2016   Anemia    hx of   Anxiety    takes Xanax daily as needed   Arthritis    Bronchitis    a month ago   Chronic back pain    stenosis   Chronic kidney disease    stage 3-sees Dr.J Patel   Class 3 obesity due to excess calories with body mass index (BMI) of 40.0 to 44.9 in adult    Diabetes mellitus    type II; takes Byetta and Invokana daily   Glaucoma    uses Eye Drops   Gout    takes Uloric daily   History of blood transfusion    no abnormal reaction    History of colon polyps    Hyperlipidemia    no meds taken now   Hypertension    takes HCTZ daily as well as Aldactone   Hypothyroidism    takes Synthroid daily   Pneumonia    many yrs ago   PONV (postoperative nausea and vomiting)    Primary localized osteoarthritis of right knee    Stress incontinence    Syncope    Weakness    numbness and tingling in right hip/eg/   Wears glasses    Wolf-Parkinson-White syndrome    takes Atenolol daily   Past Surgical History:  Procedure Laterality Date   ABDOMINAL HYSTERECTOMY     APPENDECTOMY     BACK SURGERY  2006   lumb fusion   BACK SURGERY  08/16/2014   L3-4 fusion   BREAST BIOPSY     BREAST ENHANCEMENT SURGERY Bilateral    BREAST EXCISIONAL BIOPSY Left    benign   CARDIAC CATHETERIZATION  >103yr ago   CARPAL TUNNEL  RELEASE Bilateral    x4   cataract surgery Bilateral    CESAREAN SECTION     x 3   CHOLECYSTECTOMY  1981   COLONOSCOPY     KNEE ARTHROSCOPY Right 2009   rt   RADIOLOGY WITH ANESTHESIA N/A 05/12/2020   Procedure: MRI LUMBAR SPINE WITH AND WITHOUT CONTRAST;  Surgeon: Radiologist, Medication, MD;  Location[place] [facility]  Service: Radiology;  Laterality: N/A;   RAZ procedure  10+yrs ago   REDUCTION MAMMAPLASTY Bilateral 2006   ROTATOR CUFF REPAIR Right    x 4   SHOULDER ARTHROSCOPY WITH ROTATOR CUFF REPAIR AND SUBACROMIAL DECOMPRESSION Right 06/10/2012   Procedure: SHOULDER ARTHROSCOPY WITH ROTATOR CUFF REPAIR AND SUBACROMIAL DECOMPRESSION;  Surgeon: RLorn Junes MD;  [place] [facility]  Service: Orthopedics;  Laterality: Right;  RIGHT SHOULDER ARTHROSCOPY, DECOMPRESSION SUBACROMIAL PARTIAL ACROMIOPLASTY WITH CORACOACROMIAL RELEASE,, DISTAL CLAVICULECTOMY , ROTATOR CUFF debriedment   SKIN  BIOPSY     removed moles all over body   THYROID SURGERY Right    right thyroid removed   TOTAL KNEE ARTHROPLASTY Right 04/02/2016   Procedure: RIGHT TOTAL KNEE ARTHROPLASTY;  Surgeon: Elsie Saas, MD;  Location: [place];  Service: Orthopedics;  Laterality: Right;   TRANSFORAMINAL LUMBAR INTERBODY FUSION (TLIF) WITH PEDICLE SCREW FIXATION 1 LEVEL Left 08/16/2020   Procedure: Left Lumbar two-three Transforaminal lumbar interbody fusion with exploration and extension of adjacent level fusion;  Surgeon: Erline Levine, MD;  Location: [place];  Service: Neurosurgery;  Laterality: Left;   Patient Active Problem List   Diagnosis Date Noted   Degenerative lumbar spinal stenosis 08/16/2020   Acute blood loss as cause of postoperative anemia 04/03/2016   Class 3 obesity due to excess calories with body mass index (BMI) of 40.0 to 44.9 in adult    Primary localized osteoarthritis of right knee    CKD (chronic kidney disease), stage III ([place]) 10/22/2015   Syncope 10/21/2015   Non-insulin dependent type 2  diabetes mellitus ([place]) 10/21/2015   Hypothyroidism 10/21/2015   Lumbar stenosis with neurogenic claudication 08/16/2014   Right rotator cuff tear    Anxiety    PONV (postoperative nausea and vomiting)    GERD (gastroesophageal reflux disease)    Arthritis    Thyroid disease    DIVERTICULOSIS-COLON 07/04/2009   Iron deficiency anemia 03/17/2009   PERSONAL HX COLONIC POLYPS 03/17/2009   SKIN RASH 02/23/2009   DIARRHEA 02/23/2009   FEVER, HX OF 02/23/2009   THYROID NODULE, LEFT 02/11/2009   Glaucoma 02/11/2009   Coronary atherosclerosis 02/11/2009   WOLFF (WOLFE)-PARKINSON-WHITE (WPW) SYNDROME 02/11/2009   DEGENERATIVE JOINT DISEASE, ANKLE 02/11/2009   Osteoarthritis of spine 02/11/2009   Essential hypertension 02/09/2009     PCP: Donnajean Lopes, MD   REFERRING PROVIDER: Donnajean Lopes, MD   REFERRING DIAG: R26.81 (ICD-10-CM) - Unsteadiness on feet   THERAPY DIAG:  Unsteadiness on feet   Muscle weakness (generalized)   Other low back pain   ONSET DATE: 4-5 months ago   SUBJECTIVE:    SUBJECTIVE STATEMENT: "Its mostly on my left side when I get sore"   PERTINENT HISTORY: R TKA 2018, Lumbar fusion all 5. Left ankle OA   PAIN:  Are you having pain? No: Current 0/10 NPRS scale: 3-4/10 Pain location: LB  Pain description:  Aggravating factors: upright position/walking Relieving factors: slumping forward   PRECAUTIONS: Back and Fall   WEIGHT BEARING RESTRICTIONS No   FALLS:  Has patient fallen in last 6 months? Yes. Number of falls 2   LIVING ENVIRONMENT: Lives with: lives alone Lives in: House/apartment Stairs: Yes; 2 steps to enter. Level home  Has following equipment at home: Single point cane OCCUPATION: retired   PLOF: [place] stand in kitchen and cook, laundry, make my bed better, stand upright with less pain, be stronger     OBJECTIVE:    PATIENT SURVEYS:  FOTO 31  goal 48 09/18/21: 44   COGNITION:            Overall cognitive status: Within functional limits for tasks assessed                          SENSATION: WFL     POSTURE:  Forward flex , rounded shoulder   PALPATION: Moderately sensitive to palpation throughout lumbar paraspinals     Lumbar ROM Limited secondary to pain Rotation and side  bending limited by 50% R>L Extension 0; flex 30 degrees     LE MMT:   MMT Right 08/30/2021 Left 08/30/2021 Right 09/29/21   Hip flexion 3- 3 3 3+  Hip extension 3- 3    Hip abduction        Hip adduction        Hip internal rotation        Hip external rotation        Knee flexion 3 4 3+ 4  Knee extension '4 5 4 5  '$ Ankle dorsiflexion        Ankle plantarflexion        Ankle inversion        Ankle eversion         (Blank rows = not tested)     FUNCTIONAL TESTS:  TUG 29  09/29/21: 21s 30s STS 2 6MWT: 392 ft  09/29/21: 502 ft Berg balance:18/56 (High fall risk)  BERG Balance Test         Date: 08/30/21  Standing unsupported 3 4  Sitting with back unsupported but feet supported 4 4  Stand to sit 2 3  Transfers  1 3  Standing unsupported with eyes closed 2 3  Standing unsupported feet together 2 3  From standing position, reach forward with outstretched arm 0 1  From standing position, pick up object from floor 0 1  From standing position, turn and look behind over each shoulder 1 1  Turn 360 1 2  Standing unsupported, alternately place foot on step 0 2  Standing unsupported, one foot in front 0 0  Standing on one leg 0 0  Total:  18 30      GAIT: Distance walked: 500 )to pool Assistive device utilized: none.  (Needs SPC for safety) Level of assistance: SBA Comments: Antalgic: cadence slowed, decreased step length, forward hip flex, dragging heels (decreased knee and hip flex during swing)       TODAY'S TREATMENT: Pt seen for aquatic therapy today.  Treatment took place in water 3.25-4 ft in depth at the [facility] pool. Temp of water was 90.  Pt entered/exited  the pool via stairs independently with bilat (step to pattern) rail.   Warm up of multiple laps each - forward, backward, and side stepping holding yellow noodle.   Standing 34f:  1 foam hand buoy: shoulder horizontal add/abd, cues for sscapular depression; add/abd; flex/ext x10 Squoodle pull down for core engagement 2x10 Lumbar rotation between above sets x10 Left and Right QL stretch 3x20s manual assist In "jack knife" position on steps:Hamstring stretch 3x20s; paraspinal stretch 3x20 Plank on bench with hip extension x 10 R/L cues for glut contraction with hip extension. Straddling yellow noodle cycling 2 widths   Pt requires buoyancy for support and to offload joints with strengthening exercises. Viscosity of the water is needed for resistance of strengthening; water current perturbations provides challenge to standing balance unsupported, requiring increased core activation.        PATIENT EDUCATION:  Education details: see treatment Person educated: Patient Education method: Explanation Education comprehension: verbalized understanding     HOME EXERCISE PROGRAM: [id number] from last episode   ASSESSMENT:   CLINICAL IMPRESSION:  Increased resistance with core strengthening as evidence of improving strength from blue noodle to more dense squoodle. Added more aggressive QL stretch which she reports felt good. Given slight overpressure. Tolerates well.  Goals ongoing     ACTIVITY LIMITATIONS cleaning, community activity, laundry, and shopping.  PERSONAL FACTORS Age, Fitness, and 1-2 comorbidities: OA, lumbar fusions  are also affecting patient's functional outcome.      REHAB POTENTIAL: Good   CLINICAL DECISION MAKING: Evolving/moderate complexity   EVALUATION COMPLEXITY: Moderate     GOALS: Goals reviewed with patient? Yes   SHORT TERM GOALS: Target date: 09/27/2021   Pt will make bed, changing linen in less than 15 minutes Baseline:1 hour  30 mins  09/29/21 Goal status: ongoing   2.  Berg balance score to improve to 28/56 to demonstrate improving balance decreasing fall risk Baseline: 18/56 Goal status: INITIAL   3.  Tug score will improve to less than 20s to demonstrate improving functional mobility Baseline: 29s Goal status: INITIAL       LONG TERM GOALS: Target date: 11/08/2021   Pt will be able to stand (and cook) for up >15 minutes Baseline:  Goal status: Achieved   2.  Pt will return to water aerobics with [place] wellness attending 1-2x weekly demonstrating return to PLOF @ [place] last episode Baseline:  Goal status: INITIAL   3.  Foto to meet stated goal (54) Baseline: 34 Goal status: ongoing   4.  Hip strength to improve >or= 1 grade Baseline: 3- to 3/5 Goal status: INITIAL   5.  Pt will improve on Berg balance to 35/56 or > to demonstrate decreased fall risk Baseline:18/56  Goal status: INITIAL     PT FREQUENCY: 1-2x/week   PT DURATION: 10 weeks   PLANNED INTERVENTIONS: Therapeutic exercises, Therapeutic activity, Neuromuscular re-education, Balance training, Gait training, Patient/Family education, and Joint mobilization   PLAN FOR NEXT SESSION:  Walking with head movement :Aquatics for core and LE strengthening, aerobic capacity and balance retraining.    Kallon Caylor (Frankie) Virgene Tirone MPT 10/02/21 10:00 AM

## 2021-10-04 ENCOUNTER — Ambulatory Visit (HOSPITAL_BASED_OUTPATIENT_CLINIC_OR_DEPARTMENT_OTHER): Payer: Medicare Other | Admitting: Physical Therapy

## 2021-10-09 ENCOUNTER — Ambulatory Visit (HOSPITAL_BASED_OUTPATIENT_CLINIC_OR_DEPARTMENT_OTHER): Payer: Medicare Other | Admitting: Physical Therapy

## 2021-10-09 ENCOUNTER — Encounter (HOSPITAL_BASED_OUTPATIENT_CLINIC_OR_DEPARTMENT_OTHER): Payer: Self-pay | Admitting: Physical Therapy

## 2021-10-09 DIAGNOSIS — G8929 Other chronic pain: Secondary | ICD-10-CM

## 2021-10-09 DIAGNOSIS — M5459 Other low back pain: Secondary | ICD-10-CM

## 2021-10-09 DIAGNOSIS — R2681 Unsteadiness on feet: Secondary | ICD-10-CM | POA: Diagnosis not present

## 2021-10-09 DIAGNOSIS — M6281 Muscle weakness (generalized): Secondary | ICD-10-CM

## 2021-10-09 NOTE — Therapy (Signed)
OUTPATIENT PHYSICAL THERAPY TREATMENT NOTE      Patient Name: [name] MRN: [id number] DOB:18-May-1945, 76 y.o., female Today's Date: 10/09/2021    PT End of Session - 10/09/21 1104     Visit Number 12    Number of Visits 20    Date for PT Re-Evaluation 11/08/21    Authorization Type UHC MCR    Progress Note Due on Visit 20    PT Start Time 1100    PT Stop Time 1140    PT Time Calculation (min) 40 min    Activity Tolerance Patient tolerated treatment well    Behavior During Therapy [facility] for tasks assessed/performed               Past Medical History:  Diagnosis Date   Acute blood loss as cause of postoperative anemia 04/03/2016   Anemia    hx of   Anxiety    takes Xanax daily as needed   Arthritis    Bronchitis    a month ago   Chronic back pain    stenosis   Chronic kidney disease    stage 3-sees Dr.J Patel   Class 3 obesity due to excess calories with body mass index (BMI) of 40.0 to 44.9 in adult    Diabetes mellitus    type II; takes Byetta and Invokana daily   Glaucoma    uses Eye Drops   Gout    takes Uloric daily   History of blood transfusion    no abnormal reaction    History of colon polyps    Hyperlipidemia    no meds taken now   Hypertension    takes HCTZ daily as well as Aldactone   Hypothyroidism    takes Synthroid daily   Pneumonia    many yrs ago   PONV (postoperative nausea and vomiting)    Primary localized osteoarthritis of right knee    Stress incontinence    Syncope    Weakness    numbness and tingling in right hip/eg/   Wears glasses    Wolf-Parkinson-White syndrome    takes Atenolol daily   Past Surgical History:  Procedure Laterality Date   ABDOMINAL HYSTERECTOMY     APPENDECTOMY     BACK SURGERY  2006   lumb fusion   BACK SURGERY  08/16/2014   L3-4 fusion   BREAST BIOPSY     BREAST ENHANCEMENT SURGERY Bilateral    BREAST EXCISIONAL BIOPSY Left    benign   CARDIAC CATHETERIZATION  >41yr ago   CARPAL  TUNNEL RELEASE Bilateral    x4   cataract surgery Bilateral    CESAREAN SECTION     x 3   CHOLECYSTECTOMY  1981   COLONOSCOPY     KNEE ARTHROSCOPY Right 2009   rt   RADIOLOGY WITH ANESTHESIA N/A 05/12/2020   Procedure: MRI LUMBAR SPINE WITH AND WITHOUT CONTRAST;  Surgeon: Radiologist, Medication, MD;  [place]  Service: Radiology;  Laterality: N/A;   RAZ procedure  10+yrs ago   REDUCTION MAMMAPLASTY Bilateral 2006   ROTATOR CUFF REPAIR Right    x 4   SHOULDER ARTHROSCOPY WITH ROTATOR CUFF REPAIR AND SUBACROMIAL DECOMPRESSION Right 06/10/2012   Procedure: SHOULDER ARTHROSCOPY WITH ROTATOR CUFF REPAIR AND SUBACROMIAL DECOMPRESSION;  Surgeon: RLorn Junes MD;  [place]  Service: Orthopedics;  Laterality: Right;  RIGHT SHOULDER ARTHROSCOPY, DECOMPRESSION SUBACROMIAL PARTIAL ACROMIOPLASTY WITH CORACOACROMIAL RELEASE,, DISTAL CLAVICULECTOMY , ROTATOR CUFF debriedment   SKIN  BIOPSY     removed moles all over body   THYROID SURGERY Right    right thyroid removed   TOTAL KNEE ARTHROPLASTY Right 04/02/2016   Procedure: RIGHT TOTAL KNEE ARTHROPLASTY;  Surgeon: Elsie Saas, MD;  Location: [place];  Service: Orthopedics;  Laterality: Right;   TRANSFORAMINAL LUMBAR INTERBODY FUSION (TLIF) WITH PEDICLE SCREW FIXATION 1 LEVEL Left 08/16/2020   Procedure: Left Lumbar two-three Transforaminal lumbar interbody fusion with exploration and extension of adjacent level fusion;  Surgeon: Erline Levine, MD;  Location: [place];  Service: Neurosurgery;  Laterality: Left;   Patient Active Problem List   Diagnosis Date Noted   Degenerative lumbar spinal stenosis 08/16/2020   Acute blood loss as cause of postoperative anemia 04/03/2016   Class 3 obesity due to excess calories with body mass index (BMI) of 40.0 to 44.9 in adult    Primary localized osteoarthritis of right knee    CKD (chronic kidney disease), stage III ([place]) 10/22/2015   Syncope 10/21/2015   Non-insulin dependent  type 2 diabetes mellitus ([place]) 10/21/2015   Hypothyroidism 10/21/2015   Lumbar stenosis with neurogenic claudication 08/16/2014   Right rotator cuff tear    Anxiety    PONV (postoperative nausea and vomiting)    GERD (gastroesophageal reflux disease)    Arthritis    Thyroid disease    DIVERTICULOSIS-COLON 07/04/2009   Iron deficiency anemia 03/17/2009   PERSONAL HX COLONIC POLYPS 03/17/2009   SKIN RASH 02/23/2009   DIARRHEA 02/23/2009   FEVER, HX OF 02/23/2009   THYROID NODULE, LEFT 02/11/2009   Glaucoma 02/11/2009   Coronary atherosclerosis 02/11/2009   WOLFF (WOLFE)-PARKINSON-WHITE (WPW) SYNDROME 02/11/2009   DEGENERATIVE JOINT DISEASE, ANKLE 02/11/2009   Osteoarthritis of spine 02/11/2009   Essential hypertension 02/09/2009     PCP: Donnajean Lopes, MD   REFERRING PROVIDER: Donnajean Lopes, MD   REFERRING DIAG: R26.81 (ICD-10-CM) - Unsteadiness on feet   THERAPY DIAG:  Unsteadiness on feet   Muscle weakness (generalized)   Other low back pain   ONSET DATE: 4-5 months ago   SUBJECTIVE:    SUBJECTIVE STATEMENT: Pt reports she has had increased pain in Lt  back; believes it is weather related.  Has been using cold packs and massage to area with limited relief.    PERTINENT HISTORY: R TKA 2018, Lumbar fusion all 5. Left ankle OA   PAIN:  Are you having pain? No: Current 0/10 NPRS scale: 4/10 Pain location:  Left LB (lateral, near iliac crest) Pain description:  Aggravating factors: upright position/walking Relieving factors: slumping forward   PRECAUTIONS: Back and Fall   WEIGHT BEARING RESTRICTIONS No   FALLS:  Has patient fallen in last 6 months? Yes. Number of falls 2   LIVING ENVIRONMENT: Lives with: lives alone Lives in: House/apartment Stairs: Yes; 2 steps to enter. Level home  Has following equipment at home: Single point cane OCCUPATION: retired   PLOF: [place] stand in kitchen and cook, laundry, make my bed  better, stand upright with less pain, be stronger     OBJECTIVE:    PATIENT SURVEYS:  FOTO 31  goal 48 09/18/21: 44   COGNITION:           Overall cognitive status: Within functional limits for tasks assessed                          SENSATION: WFL     POSTURE:  Forward flex ,  rounded shoulder   PALPATION: Moderately sensitive to palpation throughout lumbar paraspinals     Lumbar ROM Limited secondary to pain Rotation and side bending limited by 50% R>L Extension 0; flex 30 degrees     LE MMT:   MMT Right 08/30/2021 Left 08/30/2021 Right 09/29/21 Left  09/29/21  Hip flexion 3- 3 3 3+  Hip extension 3- 3    Hip abduction        Hip adduction        Hip internal rotation        Hip external rotation        Knee flexion 3 4 3+ 4  Knee extension '4 5 4 5  '$ Ankle dorsiflexion        Ankle plantarflexion        Ankle inversion        Ankle eversion         (Blank rows = not tested)     FUNCTIONAL TESTS:  TUG 29  09/29/21: 21s 30s STS 2 6MWT: 392 ft  09/29/21: 502 ft Berg balance:18/56 (High fall risk)  BERG Balance Test         Date: 08/30/21   09/29/21 Standing unsupported 3 4  Sitting with back unsupported but feet supported 4 4  Stand to sit 2 3  Transfers  1 3  Standing unsupported with eyes closed 2 3  Standing unsupported feet together 2 3  From standing position, reach forward with outstretched arm 0 1  From standing position, pick up object from floor 0 1  From standing position, turn and look behind over each shoulder 1 1  Turn 360 1 2  Standing unsupported, alternately place foot on step 0 2  Standing unsupported, one foot in front 0 0  Standing on one leg 0 0  Total:  18 30      GAIT: Distance walked: 500 )to pool Assistive device utilized: none.  (Needs SPC for safety) Level of assistance: SBA Comments: Antalgic: cadence slowed, decreased step length, forward hip flex, dragging heels (decreased knee and hip flex during swing)       TODAY'S  TREATMENT: Pt seen for aquatic therapy today.  Treatment took place in water 3.25-4 ft in depth at the [facility] pool. Temp of water was 90.  Pt entered/exited the pool via stairs independently with bilat (step to pattern) rail.   Warm up of multiple laps each - forward, backward, and side stepping holding yellow hand buoys.  Cues for even step length Standing 38f: SLS with intermittent support at wall:  15 sec x 2 reps each LE  Side stretch reaching to ceiling x 2 reps each side Squoodle pull down for core engagement x10;  trunk rotation with arms on thin sqoodle, looking over shoulder (cues for form) Plank on bench with hip extension x 10 R/L cues for glut contraction with hip extension Straddling yellow noodle, holding yellow hand buoys: cycling;  then in standing (with noodle and buoys still in place) squat to hip abdct/ add x 10 each LE Tandem stance holding yellow hand buoys Forward walking for rest and recovery  Pt requires buoyancy for support and to offload joints with strengthening exercises. Viscosity of the water is needed for resistance of strengthening; water current perturbations provides challenge to standing balance unsupported, requiring increased core activation.   PATIENT EDUCATION:  Education details: exercise progression Person educated: Patient Education method: Explanation Education comprehension: verbalized understanding     HOME EXERCISE PROGRAM: [id number]  from last episode   ASSESSMENT:   CLINICAL IMPRESSION:  Tandem stance as well as cycling on noodle provided good challenge today.  She reported no increase in back pain during session. Pt making good progress towards remaining goals.      ACTIVITY LIMITATIONS cleaning, community activity, laundry, and shopping.    PERSONAL FACTORS Age, Fitness, and 1-2 comorbidities: OA, lumbar fusions  are also affecting patient's functional outcome.      REHAB POTENTIAL: Good   CLINICAL DECISION MAKING:  Evolving/moderate complexity   EVALUATION COMPLEXITY: Moderate     GOALS: Goals reviewed with patient? Yes   SHORT TERM GOALS: Target date: 09/27/2021   Pt will make bed, changing linen in less than 15 minutes Baseline:1 hour  30 mins 09/29/21 Goal status: ongoing   2.  Berg balance score to improve to 28/56 to demonstrate improving balance decreasing fall risk Baseline: 18/56 Goal status: Achieved   3.  Tug score will improve to less than 20s to demonstrate improving functional mobility Baseline: 29s;  21 sec on 09/29/21 Goal status: ongoing       LONG TERM GOALS: Target date: 11/08/2021   Pt will be able to stand (and cook) for up >15 minutes Baseline:  Goal status: Achieved   2.  Pt will return to water aerobics with [place] [facility] attending 1-2x weekly demonstrating return to PLOF @ DC last episode Baseline:  Goal status: INITIAL   3.  Foto to meet stated goal (54) Baseline: 34 Goal status: ongoing   4.  Hip strength to improve >or= 1 grade Baseline: 3- to 3/5 Goal status: INITIAL   5.  Pt will improve on Berg balance to 35/56 or > to demonstrate decreased fall risk Baseline:18/56  Goal status: INITIAL     PT FREQUENCY: 1-2x/week   PT DURATION: 10 weeks   PLANNED INTERVENTIONS: Therapeutic exercises, Therapeutic activity, Neuromuscular re-education, Balance training, Gait training, Patient/Family education, and Joint mobilization   PLAN FOR NEXT SESSION:  Walking with head movement :Aquatics for core and LE strengthening, aerobic capacity and balance retraining.  Kerin Perna, PTA 10/09/21 11:48 AM

## 2021-10-13 ENCOUNTER — Ambulatory Visit (HOSPITAL_BASED_OUTPATIENT_CLINIC_OR_DEPARTMENT_OTHER): Payer: Medicare Other | Admitting: Physical Therapy

## 2021-10-13 DIAGNOSIS — M5459 Other low back pain: Secondary | ICD-10-CM

## 2021-10-13 DIAGNOSIS — M6281 Muscle weakness (generalized): Secondary | ICD-10-CM

## 2021-10-13 DIAGNOSIS — R2681 Unsteadiness on feet: Secondary | ICD-10-CM | POA: Diagnosis not present

## 2021-10-13 NOTE — Therapy (Signed)
OUTPATIENT PHYSICAL THERAPY TREATMENT NOTE      Patient Name: [name] MRN: [id number] DOB:[birth year], 76 y.o., female Today's Date: 10/13/2021    PT End of Session - 10/13/21 1318     Visit Number 13    Number of Visits 20    Date for PT Re-Evaluation 11/08/21    Authorization Type UHC MCR    Progress Note Due on Visit 20    PT Start Time 1302    PT Stop Time 1345    PT Time Calculation (min) 43 min    Activity Tolerance Patient tolerated treatment well    Behavior During Therapy WFL for tasks assessed/performed               Past Medical History:  Diagnosis Date   Acute blood loss as cause of postoperative anemia 04/03/2016   Anemia    hx of   Anxiety    takes Xanax daily as needed   Arthritis    Bronchitis    a month ago   Chronic back pain    stenosis   Chronic kidney disease    stage 3-sees Dr.J Patel   Class 3 obesity due to excess calories with body mass index (BMI) of 40.0 to 44.9 in adult    Diabetes mellitus    type II; takes Byetta and Invokana daily   Glaucoma    uses Eye Drops   Gout    takes Uloric daily   History of blood transfusion    no abnormal reaction    History of colon polyps    Hyperlipidemia    no meds taken now   Hypertension    takes HCTZ daily as well as Aldactone   Hypothyroidism    takes Synthroid daily   Pneumonia    many yrs ago   PONV (postoperative nausea and vomiting)    Primary localized osteoarthritis of right knee    Stress incontinence    Syncope    Weakness    numbness and tingling in right hip/eg/   Wears glasses    Wolf-Parkinson-White syndrome    takes Atenolol daily   Past Surgical History:  Procedure Laterality Date   ABDOMINAL HYSTERECTOMY     APPENDECTOMY     BACK SURGERY  2006   lumb fusion   BACK SURGERY  08/16/2014   L3-4 fusion   BREAST BIOPSY     BREAST ENHANCEMENT SURGERY Bilateral    BREAST EXCISIONAL BIOPSY Left    benign   CARDIAC CATHETERIZATION  >56yr ago   CARPAL  TUNNEL RELEASE Bilateral    x4   cataract surgery Bilateral    CESAREAN SECTION     x 3   CHOLECYSTECTOMY  1981   COLONOSCOPY     KNEE ARTHROSCOPY Right 2009   rt   RADIOLOGY WITH ANESTHESIA N/A 05/12/2020   Procedure: MRI LUMBAR SPINE WITH AND WITHOUT CONTRAST;  Surgeon: Radiologist, Medication, MD;  [place]  Service: Radiology;  Laterality: N/A;   RAZ procedure  10+yrs ago   REDUCTION MAMMAPLASTY Bilateral 2006   ROTATOR CUFF REPAIR Right    x 4   SHOULDER ARTHROSCOPY WITH ROTATOR CUFF REPAIR AND SUBACROMIAL DECOMPRESSION Right 06/10/2012   Procedure: SHOULDER ARTHROSCOPY WITH ROTATOR CUFF REPAIR AND SUBACROMIAL DECOMPRESSION;  Surgeon: RLorn Junes MD;  [place]  Service: Orthopedics;  Laterality: Right;  RIGHT SHOULDER ARTHROSCOPY, DECOMPRESSION SUBACROMIAL PARTIAL ACROMIOPLASTY WITH CORACOACROMIAL RELEASE,, DISTAL CLAVICULECTOMY , ROTATOR CUFF debriedment   SKIN  BIOPSY     removed moles all over body   THYROID SURGERY Right    right thyroid removed   TOTAL KNEE ARTHROPLASTY Right 04/02/2016   Procedure: RIGHT TOTAL KNEE ARTHROPLASTY;  Surgeon: Elsie Saas, MD;  Location: [place];  Service: Orthopedics;  Laterality: Right;   TRANSFORAMINAL LUMBAR INTERBODY FUSION (TLIF) WITH PEDICLE SCREW FIXATION 1 LEVEL Left 08/16/2020   Procedure: Left Lumbar two-three Transforaminal lumbar interbody fusion with exploration and extension of adjacent level fusion;  Surgeon: Erline Levine, MD;  Location: [place];  Service: Neurosurgery;  Laterality: Left;   Patient Active Problem List   Diagnosis Date Noted   Degenerative lumbar spinal stenosis 08/16/2020   Acute blood loss as cause of postoperative anemia 04/03/2016   Class 3 obesity due to excess calories with body mass index (BMI) of 40.0 to 44.9 in adult    Primary localized osteoarthritis of right knee    CKD (chronic kidney disease), stage III ([place]) 10/22/2015   Syncope 10/21/2015   Non-insulin dependent  type 2 diabetes mellitus ([place]) 10/21/2015   Hypothyroidism 10/21/2015   Lumbar stenosis with neurogenic claudication 08/16/2014   Right rotator cuff tear    Anxiety    PONV (postoperative nausea and vomiting)    GERD (gastroesophageal reflux disease)    Arthritis    Thyroid disease    DIVERTICULOSIS-COLON 07/04/2009   Iron deficiency anemia 03/17/2009   PERSONAL HX COLONIC POLYPS 03/17/2009   SKIN RASH 02/23/2009   DIARRHEA 02/23/2009   FEVER, HX OF 02/23/2009   THYROID NODULE, LEFT 02/11/2009   Glaucoma 02/11/2009   Coronary atherosclerosis 02/11/2009   WOLFF (WOLFE)-PARKINSON-WHITE (WPW) SYNDROME 02/11/2009   DEGENERATIVE JOINT DISEASE, ANKLE 02/11/2009   Osteoarthritis of spine 02/11/2009   Essential hypertension 02/09/2009     PCP: Donnajean Lopes, MD   REFERRING PROVIDER: Donnajean Lopes, MD   REFERRING DIAG: R26.81 (ICD-10-CM) - Unsteadiness on feet   THERAPY DIAG:  Unsteadiness on feet   Muscle weakness (generalized)   Other low back pain   ONSET DATE: 4-5 months ago   SUBJECTIVE:    SUBJECTIVE STATEMENT: Pt reports she to have constant "aggravation" on Lt back, "Not pain".  She states that she has been working on [place]: R TKA 2018, Lumbar fusion all 5. Left ankle OA   PAIN:  Are you having pain? No:  NPRS scale: Current 0/10 Pain location:  Left LB (lateral, near iliac crest) Pain description:  Aggravating factors: upright position/walking Relieving factors: slumping forward   PRECAUTIONS: Back and Fall   WEIGHT BEARING RESTRICTIONS No   FALLS:  Has patient fallen in last 6 months? Yes. Number of falls 2   LIVING ENVIRONMENT: Lives with: lives alone Lives in: House/apartment Stairs: Yes; 2 steps to enter. Level home  Has following equipment at home: Single point cane OCCUPATION: retired   PLOF: [place] stand in kitchen and cook, laundry, make my bed better, stand upright with less pain, be  stronger     OBJECTIVE:    PATIENT SURVEYS:  FOTO 31  goal 48 09/18/21: 44   COGNITION:           Overall cognitive status: Within functional limits for tasks assessed                          SENSATION: WFL     POSTURE:  Forward flex , rounded shoulder   PALPATION: Moderately sensitive to  palpation throughout lumbar paraspinals     Lumbar ROM Limited secondary to pain Rotation and side bending limited by 50% R>L Extension 0; flex 30 degrees     LE MMT:   MMT Right 08/30/2021 Left 08/30/2021 Right 09/29/21 Left  09/29/21  Hip flexion 3- 3 3 3+  Hip extension 3- 3    Hip abduction        Hip adduction        Hip internal rotation        Hip external rotation        Knee flexion 3 4 3+ 4  Knee extension '4 5 4 5  '$ Ankle dorsiflexion        Ankle plantarflexion        Ankle inversion        Ankle eversion         (Blank rows = not tested)     FUNCTIONAL TESTS:  TUG 29  09/29/21: 21s 30s STS 2 6MWT: 392 ft  09/29/21: 502 ft Berg balance:18/56 (High fall risk)  BERG Balance Test         Date: 08/30/21   09/29/21 Standing unsupported 3 4  Sitting with back unsupported but feet supported 4 4  Stand to sit 2 3  Transfers  1 3  Standing unsupported with eyes closed 2 3  Standing unsupported feet together 2 3  From standing position, reach forward with outstretched arm 0 1  From standing position, pick up object from floor 0 1  From standing position, turn and look behind over each shoulder 1 1  Turn 360 1 2  Standing unsupported, alternately place foot on step 0 2  Standing unsupported, one foot in front 0 0  Standing on one leg 0 0  Total:  18 30      GAIT: Distance walked: 500 )to pool Assistive device utilized: none.  (Needs SPC for safety) Level of assistance: SBA Comments: Antalgic: cadence slowed, decreased step length, forward hip flex, dragging heels (decreased knee and hip flex during swing)       TODAY'S TREATMENT: Pt seen for aquatic therapy today.   Treatment took place in water 3.25-4 ft in depth at the [facility] pool. Temp of water was 90.  Pt entered/exited the pool via stairs independently with bilat (step to pattern) rail.   Warm up of multiple laps each - forward, backward with horiz head turns Holding yellow hand buoys:  forward/ backward tandem gait; SLS with opp 3 way leg SLR x 8 each LE Straddling yellow noodle, holding yellow hand buoys: cycling;  then in standing (with noodle and buoys still in place) jumping jack with legs Standing with hands on hips, trunk rotation to look over shoulder (Berg item) Side stretch reaching to ceiling x 4 reps for Lt side Side stepping with arms abdct/ add (improved steadiness with increased speed) Holding wall:  heel/ toe raise Plank on bench with hip extension x 10 R/L cues for glut contraction with hip extension Forward walk for rest and recovery with UE on yellow noodle Squoodle pull down in staggered stance for core engagement x 5   Pt requires buoyancy for support and to offload joints with strengthening exercises. Viscosity of the water is needed for resistance of strengthening; water current perturbations provides challenge to standing balance unsupported, requiring increased core activation.   PATIENT EDUCATION:  Education details: exercise progression Person educated: Patient Education method: Explanation Education comprehension: verbalized understanding     HOME EXERCISE  PROGRAM: [id number] from last episode   ASSESSMENT:   CLINICAL IMPRESSION:  Tandem gait, hip ext in plank, and cycling on noodle continue to provide a good challenge.  She reported no increase in back pain during session. Pt has more difficulty turning Rt to look over shoulder, than Lt; required cues to allow more swivel from the hips.  Pt making good progress towards remaining goals.      ACTIVITY LIMITATIONS cleaning, community activity, laundry, and shopping.    PERSONAL FACTORS Age, Fitness,  and 1-2 comorbidities: OA, lumbar fusions  are also affecting patient's functional outcome.      REHAB POTENTIAL: Good   CLINICAL DECISION MAKING: Evolving/moderate complexity   EVALUATION COMPLEXITY: Moderate     GOALS: Goals reviewed with patient? Yes   SHORT TERM GOALS: Target date: 09/27/2021   Pt will make bed, changing linen in less than 15 minutes Baseline:1 hour  30 mins 09/29/21 Goal status: ongoing   2.  Berg balance score to improve to 28/56 to demonstrate improving balance decreasing fall risk Baseline: 18/56 Goal status: Achieved   3.  Tug score will improve to less than 20s to demonstrate improving functional mobility Baseline: 29s;  21 sec on 09/29/21 Goal status: ongoing       LONG TERM GOALS: Target date: 11/08/2021   Pt will be able to stand (and cook) for up >15 minutes Baseline:  Goal status: Achieved   2.  Pt will return to water aerobics with [place] wellness attending 1-2x weekly demonstrating return to PLOF @ DC last episode Baseline:  Goal status: INITIAL   3.  Foto to meet stated goal (54) Baseline: 34 Goal status: ongoing   4.  Hip strength to improve >or= 1 grade Baseline: 3- to 3/5 Goal status: INITIAL   5.  Pt will improve on Berg balance to 35/56 or > to demonstrate decreased fall risk Baseline:18/56  Goal status: INITIAL     PT FREQUENCY: 1-2x/week   PT DURATION: 10 weeks   PLANNED INTERVENTIONS: Therapeutic exercises, Therapeutic activity, Neuromuscular re-education, Balance training, Gait training, Patient/Family education, and Joint mobilization   PLAN FOR NEXT SESSION:  Walking with head movement :Aquatics for core and LE strengthening, aerobic capacity and balance retraining.    Kerin Perna, PTA 10/13/21 1:48 PM

## 2021-10-17 ENCOUNTER — Ambulatory Visit (HOSPITAL_BASED_OUTPATIENT_CLINIC_OR_DEPARTMENT_OTHER): Payer: Medicare Other | Admitting: Physical Therapy

## 2021-10-17 ENCOUNTER — Encounter (HOSPITAL_BASED_OUTPATIENT_CLINIC_OR_DEPARTMENT_OTHER): Payer: Self-pay | Admitting: Physical Therapy

## 2021-10-17 DIAGNOSIS — M6281 Muscle weakness (generalized): Secondary | ICD-10-CM

## 2021-10-17 DIAGNOSIS — R2681 Unsteadiness on feet: Secondary | ICD-10-CM

## 2021-10-17 DIAGNOSIS — M5459 Other low back pain: Secondary | ICD-10-CM

## 2021-10-17 NOTE — Therapy (Signed)
OUTPATIENT PHYSICAL THERAPY TREATMENT NOTE      Patient Name: ROMANDA TURRUBIATES MRN: [id number] DOB:08-07-1945, 76 y.o., female Today's Date: 10/17/2021    PT End of Session - 10/17/21 1043     Visit Number 14    Number of Visits 20    Date for PT Re-Evaluation 11/08/21    Authorization Type UHC MCR    Progress Note Due on Visit 20    PT Start Time 1030    PT Stop Time 1115    PT Time Calculation (min) 45 min    Activity Tolerance Patient tolerated treatment well    Behavior During Therapy [facility] for tasks assessed/performed               Past Medical History:  Diagnosis Date   Acute blood loss as cause of postoperative anemia 04/03/2016   Anemia    hx of   Anxiety    takes Xanax daily as needed   Arthritis    Bronchitis    a month ago   Chronic back pain    stenosis   Chronic kidney disease    stage 3-sees Dr.J Patel   Class 3 obesity due to excess calories with body mass index (BMI) of 40.0 to 44.9 in adult    Diabetes mellitus    type II; takes Byetta and Invokana daily   Glaucoma    uses Eye Drops   Gout    takes Uloric daily   History of blood transfusion    no abnormal reaction    History of colon polyps    Hyperlipidemia    no meds taken now   Hypertension    takes HCTZ daily as well as Aldactone   Hypothyroidism    takes Synthroid daily   Pneumonia    many yrs ago   PONV (postoperative nausea and vomiting)    Primary localized osteoarthritis of right knee    Stress incontinence    Syncope    Weakness    numbness and tingling in right hip/eg/   Wears glasses    Wolf-Parkinson-White syndrome    takes Atenolol daily   Past Surgical History:  Procedure Laterality Date   ABDOMINAL HYSTERECTOMY     APPENDECTOMY     BACK SURGERY  2006   lumb fusion   BACK SURGERY  08/16/2014   L3-4 fusion   BREAST BIOPSY     BREAST ENHANCEMENT SURGERY Bilateral    BREAST EXCISIONAL BIOPSY Left    benign   CARDIAC CATHETERIZATION  >34yr ago   CARPAL  TUNNEL RELEASE Bilateral    x4   cataract surgery Bilateral    CESAREAN SECTION     x 3   CHOLECYSTECTOMY  1981   COLONOSCOPY     KNEE ARTHROSCOPY Right 2009   rt   RADIOLOGY WITH ANESTHESIA N/A 05/12/2020   Procedure: MRI LUMBAR SPINE WITH AND WITHOUT CONTRAST;  Surgeon: Radiologist, Medication, MD;  [place] [facility]  Service: Radiology;  Laterality: N/A;   RAZ procedure  10+yrs ago   REDUCTION MAMMAPLASTY Bilateral 2006   ROTATOR CUFF REPAIR Right    x 4   SHOULDER ARTHROSCOPY WITH ROTATOR CUFF REPAIR AND SUBACROMIAL DECOMPRESSION Right 06/10/2012   Procedure: SHOULDER ARTHROSCOPY WITH ROTATOR CUFF REPAIR AND SUBACROMIAL DECOMPRESSION;  Surgeon: RLorn Junes MD;  [place] [facility]  Service: Orthopedics;  Laterality: Right;  RIGHT SHOULDER ARTHROSCOPY, DECOMPRESSION SUBACROMIAL PARTIAL ACROMIOPLASTY WITH CORACOACROMIAL RELEASE,, DISTAL CLAVICULECTOMY , ROTATOR CUFF debriedment   SKIN  BIOPSY     removed moles all over body   THYROID SURGERY Right    right thyroid removed   TOTAL KNEE ARTHROPLASTY Right 04/02/2016   Procedure: RIGHT TOTAL KNEE ARTHROPLASTY;  Surgeon: Elsie Saas, MD;  Location: [place];  Service: Orthopedics;  Laterality: Right;   TRANSFORAMINAL LUMBAR INTERBODY FUSION (TLIF) WITH PEDICLE SCREW FIXATION 1 LEVEL Left 08/16/2020   Procedure: Left Lumbar two-three Transforaminal lumbar interbody fusion with exploration and extension of adjacent level fusion;  Surgeon: Erline Levine, MD;  Location: [place];  Service: Neurosurgery;  Laterality: Left;   Patient Active Problem List   Diagnosis Date Noted   Degenerative lumbar spinal stenosis 08/16/2020   Acute blood loss as cause of postoperative anemia 04/03/2016   Class 3 obesity due to excess calories with body mass index (BMI) of 40.0 to 44.9 in adult    Primary localized osteoarthritis of right knee    CKD (chronic kidney disease), stage III ([place]) 10/22/2015   Syncope 10/21/2015   Non-insulin dependent  type 2 diabetes mellitus ([place]) 10/21/2015   Hypothyroidism 10/21/2015   Lumbar stenosis with neurogenic claudication 08/16/2014   Right rotator cuff tear    Anxiety    PONV (postoperative nausea and vomiting)    GERD (gastroesophageal reflux disease)    Arthritis    Thyroid disease    DIVERTICULOSIS-COLON 07/04/2009   Iron deficiency anemia 03/17/2009   PERSONAL HX COLONIC POLYPS 03/17/2009   SKIN RASH 02/23/2009   DIARRHEA 02/23/2009   FEVER, HX OF 02/23/2009   THYROID NODULE, LEFT 02/11/2009   Glaucoma 02/11/2009   Coronary atherosclerosis 02/11/2009   WOLFF (WOLFE)-PARKINSON-WHITE (WPW) SYNDROME 02/11/2009   DEGENERATIVE JOINT DISEASE, ANKLE 02/11/2009   Osteoarthritis of spine 02/11/2009   Essential hypertension 02/09/2009     PCP: Donnajean Lopes, MD   REFERRING PROVIDER: Donnajean Lopes, MD   REFERRING DIAG: R26.81 (ICD-10-CM) - Unsteadiness on feet   THERAPY DIAG:  Unsteadiness on feet   Muscle weakness (generalized)   Other low back pain   ONSET DATE: 4-5 months ago   SUBJECTIVE:    SUBJECTIVE STATEMENT: "I'm tired today but wanted to come.  It is helping me so much. Back is just stiff." PERTINENT HISTORY: R TKA 2018, Lumbar fusion all 5. Left ankle OA   PAIN:  Are you having pain? No:  NPRS scale: Current 0/10 Pain location:  Left LB (lateral, near iliac crest) Pain description:  Aggravating factors: upright position/walking Relieving factors: slumping forward   PRECAUTIONS: Back and Fall   WEIGHT BEARING RESTRICTIONS No   FALLS:  Has patient fallen in last 6 months? Yes. Number of falls 2   LIVING ENVIRONMENT: Lives with: lives alone Lives in: House/apartment Stairs: Yes; 2 steps to enter. Level home  Has following equipment at home: Single point cane OCCUPATION: retired   PLOF: [place] stand in kitchen and cook, laundry, make my bed better, stand upright with less pain, be stronger     OBJECTIVE:     PATIENT SURVEYS:  FOTO 31  goal 48 09/18/21: 44   COGNITION:           Overall cognitive status: Within functional limits for tasks assessed                          SENSATION: WFL     POSTURE:  Forward flex , rounded shoulder   PALPATION: Moderately sensitive to palpation throughout lumbar paraspinals  Lumbar ROM Limited secondary to pain Rotation and side bending limited by 50% R>L Extension 0; flex 30 degrees     LE MMT:   MMT Right 08/30/2021 Left 08/30/2021 Right 09/29/21 Left  09/29/21  Hip flexion 3- 3 3 3+  Hip extension 3- 3    Hip abduction        Hip adduction        Hip internal rotation        Hip external rotation        Knee flexion 3 4 3+ 4  Knee extension '4 5 4 5  '$ Ankle dorsiflexion        Ankle plantarflexion        Ankle inversion        Ankle eversion         (Blank rows = not tested)     FUNCTIONAL TESTS:  TUG 29  09/29/21: 21s 30s STS 2 6MWT: 392 ft  09/29/21: 502 ft Berg balance:18/56 (High fall risk)  BERG Balance Test         Date: 08/30/21   09/29/21 Standing unsupported 3 4  Sitting with back unsupported but feet supported 4 4  Stand to sit 2 3  Transfers  1 3  Standing unsupported with eyes closed 2 3  Standing unsupported feet together 2 3  From standing position, reach forward with outstretched arm 0 1  From standing position, pick up object from floor 0 1  From standing position, turn and look behind over each shoulder 1 1  Turn 360 1 2  Standing unsupported, alternately place foot on step 0 2  Standing unsupported, one foot in front 0 0  Standing on one leg 0 0  Total:  18 30      GAIT: Distance walked: 500 )to pool Assistive device utilized: none.  (Needs SPC for safety) Level of assistance: SBA Comments: Antalgic: cadence slowed, decreased step length, forward hip flex, dragging heels (decreased knee and hip flex during swing)       TODAY'S TREATMENT: Pt seen for aquatic therapy today.  Treatment took place in water  3.25-4 ft in depth at the [facility] pool. Temp of water was 90.  Pt entered/exited the pool via stairs independently with bilat (step to pattern) rail.   Warm up of multiple laps each - forward, backward with horiz head turns Holding yellow hand buoys:  forward/ backward tandem gait completed in 3 ft then 4.5 ft tom inmprove success; SLS with opp 3 way leg SLR x 8 each LE Squoodle pull down feet together core stabilized x10 in staggered stance for core engagement x 5  Straddling yellow noodle, UE supported on corner wall: cycling; add/abd; then in standing (with noodle and buoys still in place) jumping jack with legs  x10 Side stretch reaching to ceiling x 3 reps right and left  Plank on bench with hip extension x 10 R/L cues for glut contraction with hip extension Forward walk for rest and recovery with UE on yellow noodle    Pt requires buoyancy for support and to offload joints with strengthening exercises. Viscosity of the water is needed for resistance of strengthening; water current perturbations provides challenge to standing balance unsupported, requiring increased core activation.   PATIENT EDUCATION:  Education details: exercise progression Person educated: Patient Education method: Explanation Education comprehension: verbalized understanding     HOME EXERCISE PROGRAM: [id number] from last episode   ASSESSMENT:   CLINICAL IMPRESSION:  Pt reporting no pain  just stiffness left LB area. Overall pain sx greatly decreased since onset of aquatic therapy. Balance continues to be focus working core muscles/proximal strength. She tolerates progressive additions well.  Goals ongoing.  Tandem gait, hip ext in plank, and cycling on noodle continue to provide a good challenge.  She reported no increase in back pain during session. Pt has more difficulty turning Rt to look over shoulder, than Lt; required cues to allow more swivel from the hips.  Pt making good progress towards  remaining goals.      ACTIVITY LIMITATIONS cleaning, community activity, laundry, and shopping.    PERSONAL FACTORS Age, Fitness, and 1-2 comorbidities: OA, lumbar fusions  are also affecting patient's functional outcome.      REHAB POTENTIAL: Good   CLINICAL DECISION MAKING: Evolving/moderate complexity   EVALUATION COMPLEXITY: Moderate     GOALS: Goals reviewed with patient? Yes   SHORT TERM GOALS: Target date: 09/27/2021   Pt will make bed, changing linen in less than 15 minutes Baseline:1 hour  30 mins 09/29/21 Goal status: ongoing   2.  Berg balance score to improve to 28/56 to demonstrate improving balance decreasing fall risk Baseline: 18/56 Goal status: Achieved   3.  Tug score will improve to less than 20s to demonstrate improving functional mobility Baseline: 29s;  21 sec on 09/29/21 Goal status: ongoing       LONG TERM GOALS: Target date: 11/08/2021   Pt will be able to stand (and cook) for up >15 minutes Baseline:  Goal status: Achieved   2.  Pt will return to water aerobics with [place] [facility] attending 1-2x weekly demonstrating return to PLOF @ DC last episode Baseline:  Goal status: INITIAL   3.  Foto to meet stated goal (54) Baseline: 34 Goal status: ongoing   4.  Hip strength to improve >or= 1 grade Baseline: 3- to 3/5 Goal status: INITIAL   5.  Pt will improve on Berg balance to 35/56 or > to demonstrate decreased fall risk Baseline:18/56  Goal status: INITIAL     PT FREQUENCY: 1-2x/week   PT DURATION: 10 weeks   PLANNED INTERVENTIONS: Therapeutic exercises, Therapeutic activity, Neuromuscular re-education, Balance training, Gait training, Patient/Family education, and Joint mobilization   PLAN FOR NEXT SESSION:  Walking with head movement :Aquatics for core and LE strengthening, aerobic capacity and balance retraining.  Stanton Kidney Tharon Aquas) Pakou Rainbow MPT 10/17/21 3:19 PM

## 2021-10-24 ENCOUNTER — Encounter (HOSPITAL_BASED_OUTPATIENT_CLINIC_OR_DEPARTMENT_OTHER): Payer: Self-pay | Admitting: Physical Therapy

## 2021-10-24 ENCOUNTER — Ambulatory Visit (HOSPITAL_BASED_OUTPATIENT_CLINIC_OR_DEPARTMENT_OTHER): Payer: Medicare Other | Admitting: Physical Therapy

## 2021-10-24 DIAGNOSIS — R2681 Unsteadiness on feet: Secondary | ICD-10-CM | POA: Diagnosis not present

## 2021-10-24 DIAGNOSIS — M6281 Muscle weakness (generalized): Secondary | ICD-10-CM

## 2021-10-24 DIAGNOSIS — M5459 Other low back pain: Secondary | ICD-10-CM

## 2021-10-27 ENCOUNTER — Ambulatory Visit (HOSPITAL_BASED_OUTPATIENT_CLINIC_OR_DEPARTMENT_OTHER): Payer: Medicare Other | Admitting: Physical Therapy

## 2021-10-27 ENCOUNTER — Encounter (HOSPITAL_BASED_OUTPATIENT_CLINIC_OR_DEPARTMENT_OTHER): Payer: Self-pay | Admitting: Physical Therapy

## 2021-10-27 DIAGNOSIS — R2681 Unsteadiness on feet: Secondary | ICD-10-CM

## 2021-10-27 DIAGNOSIS — M5459 Other low back pain: Secondary | ICD-10-CM

## 2021-10-27 DIAGNOSIS — M6281 Muscle weakness (generalized): Secondary | ICD-10-CM

## 2021-10-27 NOTE — Therapy (Signed)
OUTPATIENT PHYSICAL THERAPY TREATMENT NOTE      Patient Name: [name] MRN: [id number] DOB:Dec 31, 1945, 76 y.o., female Today's Date: 10/27/2021    PT End of Session - 10/27/21 1125     Visit Number 16    Number of Visits 20    Date for PT Re-Evaluation 11/08/21    Authorization Type UHC MCR    Progress Note Due on Visit 20    PT Start Time 1116    PT Stop Time 1145   Pt nneded to leave early   PT Time Calculation (min) 29 min    Activity Tolerance Patient tolerated treatment well    Behavior During Therapy [facility] for tasks assessed/performed               Past Medical History:  Diagnosis Date   Acute blood loss as cause of postoperative anemia 04/03/2016   Anemia    hx of   Anxiety    takes Xanax daily as needed   Arthritis    Bronchitis    a month ago   Chronic back pain    stenosis   Chronic kidney disease    stage 3-sees Dr.J Patel   Class 3 obesity due to excess calories with body mass index (BMI) of 40.0 to 44.9 in adult    Diabetes mellitus    type II; takes Byetta and Invokana daily   Glaucoma    uses Eye Drops   Gout    takes Uloric daily   History of blood transfusion    no abnormal reaction    History of colon polyps    Hyperlipidemia    no meds taken now   Hypertension    takes HCTZ daily as well as Aldactone   Hypothyroidism    takes Synthroid daily   Pneumonia    many yrs ago   PONV (postoperative nausea and vomiting)    Primary localized osteoarthritis of right knee    Stress incontinence    Syncope    Weakness    numbness and tingling in right hip/eg/   Wears glasses    Wolf-Parkinson-White syndrome    takes Atenolol daily   Past Surgical History:  Procedure Laterality Date   ABDOMINAL HYSTERECTOMY     APPENDECTOMY     BACK SURGERY  2006   lumb fusion   BACK SURGERY  08/16/2014   L3-4 fusion   BREAST BIOPSY     BREAST ENHANCEMENT SURGERY Bilateral    BREAST EXCISIONAL BIOPSY Left    benign   CARDIAC CATHETERIZATION   >72yr ago   CARPAL TUNNEL RELEASE Bilateral    x4   cataract surgery Bilateral    CESAREAN SECTION     x 3   CHOLECYSTECTOMY  1981   COLONOSCOPY     KNEE ARTHROSCOPY Right 2009   rt   RADIOLOGY WITH ANESTHESIA N/A 05/12/2020   Procedure: MRI LUMBAR SPINE WITH AND WITHOUT CONTRAST;  Surgeon: Radiologist, Medication, MD;  [place]  Service: Radiology;  Laterality: N/A;   RAZ procedure  10+yrs ago   REDUCTION MAMMAPLASTY Bilateral 2006   ROTATOR CUFF REPAIR Right    x 4   SHOULDER ARTHROSCOPY WITH ROTATOR CUFF REPAIR AND SUBACROMIAL DECOMPRESSION Right 06/10/2012   Procedure: SHOULDER ARTHROSCOPY WITH ROTATOR CUFF REPAIR AND SUBACROMIAL DECOMPRESSION;  Surgeon: RLorn Junes MD;  [place]  Service: Orthopedics;  Laterality: Right;  RIGHT SHOULDER ARTHROSCOPY, DECOMPRESSION SUBACROMIAL PARTIAL ACROMIOPLASTY WITH CORACOACROMIAL RELEASE,, DISTAL CLAVICULECTOMY ,  ROTATOR CUFF debriedment   SKIN BIOPSY     removed moles all over body   THYROID SURGERY Right    right thyroid removed   TOTAL KNEE ARTHROPLASTY Right 04/02/2016   Procedure: RIGHT TOTAL KNEE ARTHROPLASTY;  Surgeon: Elsie Saas, MD;  Location: [place];  Service: Orthopedics;  Laterality: Right;   TRANSFORAMINAL LUMBAR INTERBODY FUSION (TLIF) WITH PEDICLE SCREW FIXATION 1 LEVEL Left 08/16/2020   Procedure: Left Lumbar two-three Transforaminal lumbar interbody fusion with exploration and extension of adjacent level fusion;  Surgeon: Erline Levine, MD;  Location: [place];  Service: Neurosurgery;  Laterality: Left;   Patient Active Problem List   Diagnosis Date Noted   Degenerative lumbar spinal stenosis 08/16/2020   Acute blood loss as cause of postoperative anemia 04/03/2016   Class 3 obesity due to excess calories with body mass index (BMI) of 40.0 to 44.9 in adult    Primary localized osteoarthritis of right knee    CKD (chronic kidney disease), stage III ([place]) 10/22/2015   Syncope 10/21/2015    Non-insulin dependent type 2 diabetes mellitus ([place]) 10/21/2015   Hypothyroidism 10/21/2015   Lumbar stenosis with neurogenic claudication 08/16/2014   Right rotator cuff tear    Anxiety    PONV (postoperative nausea and vomiting)    GERD (gastroesophageal reflux disease)    Arthritis    Thyroid disease    DIVERTICULOSIS-COLON 07/04/2009   Iron deficiency anemia 03/17/2009   PERSONAL HX COLONIC POLYPS 03/17/2009   SKIN RASH 02/23/2009   DIARRHEA 02/23/2009   FEVER, HX OF 02/23/2009   THYROID NODULE, LEFT 02/11/2009   Glaucoma 02/11/2009   Coronary atherosclerosis 02/11/2009   WOLFF (WOLFE)-PARKINSON-WHITE (WPW) SYNDROME 02/11/2009   DEGENERATIVE JOINT DISEASE, ANKLE 02/11/2009   Osteoarthritis of spine 02/11/2009   Essential hypertension 02/09/2009     PCP: Donnajean Lopes, MD   REFERRING PROVIDER: Donnajean Lopes, MD   REFERRING DIAG: R26.81 (ICD-10-CM) - Unsteadiness on feet   THERAPY DIAG:  Unsteadiness on feet   Muscle weakness (generalized)   Other low back pain   ONSET DATE: 4-5 months ago   SUBJECTIVE:    SUBJECTIVE STATEMENT: "No pain in lb or rle.  Feel good" PERTINENT HISTORY: R TKA 2018, Lumbar fusion all 5. Left ankle OA   PAIN:  Are you having pain? No:  NPRS scale: Current 0/10 Pain location:  Left LB (lateral, near iliac crest) Pain description:  Aggravating factors: upright position/walking Relieving factors: slumping forward   PRECAUTIONS: Back and Fall   WEIGHT BEARING RESTRICTIONS No   FALLS:  Has patient fallen in last 6 months? Yes. Number of falls 2   LIVING ENVIRONMENT: Lives with: lives alone Lives in: House/apartment Stairs: Yes; 2 steps to enter. Level home  Has following equipment at home: Single point cane OCCUPATION: retired   PLOF: [place] stand in kitchen and cook, laundry, make my bed better, stand upright with less pain, be stronger     OBJECTIVE:    PATIENT SURVEYS:  FOTO 31  goal  48 09/18/21: 44   COGNITION:           Overall cognitive status: Within functional limits for tasks assessed                          SENSATION: WFL     POSTURE:  Forward flex , rounded shoulder   PALPATION: Moderately sensitive to palpation throughout lumbar paraspinals     Lumbar  ROM Limited secondary to pain Rotation and side bending limited by 50% R>L Extension 0; flex 30 degrees     LE MMT:   MMT Right 08/30/2021 Left 08/30/2021 Right 09/29/21 Left  09/29/21  Hip flexion 3- 3 3 3+  Hip extension 3- 3    Hip abduction        Hip adduction        Hip internal rotation        Hip external rotation        Knee flexion 3 4 3+ 4  Knee extension '4 5 4 5  '$ Ankle dorsiflexion        Ankle plantarflexion        Ankle inversion        Ankle eversion         (Blank rows = not tested)     FUNCTIONAL TESTS:  TUG 29  09/29/21: 21s 30s STS 2 6MWT: 392 ft  09/29/21: 502 ft Berg balance:18/56 (High fall risk)  BERG Balance Test         Date: 08/30/21   09/29/21 Standing unsupported 3 4  Sitting with back unsupported but feet supported 4 4  Stand to sit 2 3  Transfers  1 3  Standing unsupported with eyes closed 2 3  Standing unsupported feet together 2 3  From standing position, reach forward with outstretched arm 0 1  From standing position, pick up object from floor 0 1  From standing position, turn and look behind over each shoulder 1 1  Turn 360 1 2  Standing unsupported, alternately place foot on step 0 2  Standing unsupported, one foot in front 0 0  Standing on one leg 0 0  Total:  18 30      GAIT: Distance walked: 500 to pool Assistive device utilized: none.  (Needs SPC for safety) Level of assistance: SBA Comments: Antalgic: cadence slowed, decreased step length, forward hip flex, dragging heels (decreased knee and hip flex during swing)       TODAY'S TREATMENT: Pt seen for aquatic therapy today.  Treatment took place in water 3.25-4 ft in depth at the [facility] pool. Temp of water was 90.  Pt entered/exited the pool via stairs independently with bilat (step to pattern) rail.   Warm up of multiple laps each - forward, backward STS from 4th step 2x5 without UE support no LOB Holding yellow hand buoys:  toe raises; heel raises; hip openers; hurdles; forward/ backward tandem gait completed in 3 ft ; SLS with opp 3 way Squoodle pull down feet together core stabilized 2x15  Plank on bench with hip extension x 15 R/L cues for glut contraction with hip extension Kick board push pull staggered stance. Cues for abdominal bracing x 20 leading R/L; repeated push pull at 10/12/2 x 20 Side stretch reaching to ceiling x 3 reps right and left yellow noodle under arms posteriorly: cycling; add/abd; Forward walk for rest and recovery without UE support    Pt requires buoyancy for support and to offload joints with strengthening exercises. Viscosity of the water is needed for resistance of strengthening; water current perturbations provides challenge to standing balance unsupported, requiring increased core activation.   PATIENT EDUCATION:  Education details: exercise progression Person educated: Patient Education method: Explanation Education comprehension: verbalized understanding     HOME EXERCISE PROGRAM: [id number] from last episode   ASSESSMENT:   CLINICAL IMPRESSION:   Pt with limited time today.  She is directed through strength  exercises as she reports compliance with stretching at home daily.  No pain reports continue at rest or wth amb.  She  no longer has need for AD. Gait wfl. She states she can really tell she is getting stronger.  Pt is progressing very well towards goals with anticipation of meet by end of cert.    ACTIVITY LIMITATIONS cleaning, community activity, laundry, and shopping.    PERSONAL FACTORS Age, Fitness, and 1-2 comorbidities: OA, lumbar fusions  are also affecting patient's functional outcome.      REHAB  POTENTIAL: Good   CLINICAL DECISION MAKING: Evolving/moderate complexity   EVALUATION COMPLEXITY: Moderate     GOALS: Goals reviewed with patient? Yes   SHORT TERM GOALS: Target date: 09/27/2021   Pt will make bed, changing linen in less than 15 minutes Baseline:1 hour;  30 mins 09/29/21; 15 mins 10/24/21 Goal status: ongoing   2.  Berg balance score to improve to 28/56 to demonstrate improving balance decreasing fall risk Baseline: 18/56 Goal status: Achieved   3.  Tug score will improve to less than 20s to demonstrate improving functional mobility Baseline: 29s;  21 sec on 09/29/21 Goal status: ongoing       LONG TERM GOALS: Target date: 11/08/2021   Pt will be able to stand (and cook) for up >15 minutes Baseline:  Goal status: Achieved   2.  Pt will return to water aerobics with [facility] wellness attending 1-2x weekly demonstrating return to PLOF @ DC last episode Baseline:  Goal status: ongoing   3.  Foto to meet stated goal (54) Baseline: 34 Goal status: ongoing   4.  Hip strength to improve >or= 1 grade Baseline: 3- to 3/5 Goal status: INITIAL   5.  Pt will improve on Berg balance to 35/56 or > to demonstrate decreased fall risk Baseline:18/56  Goal status: INITIAL     PT FREQUENCY: 1-2x/week   PT DURATION: 10 weeks   PLANNED INTERVENTIONS: Therapeutic exercises, Therapeutic activity, Neuromuscular re-education, Balance training, Gait training, Patient/Family education, and Joint mobilization   PLAN FOR NEXT SESSION:  Walking with head movement :Aquatics for core and LE strengthening, aerobic capacity and balance retraining.  Stanton Kidney Tharon Aquas) Cinderella Christoffersen MPT 10/27/21 11:29 AM

## 2021-11-01 ENCOUNTER — Ambulatory Visit (HOSPITAL_BASED_OUTPATIENT_CLINIC_OR_DEPARTMENT_OTHER): Payer: Medicare Other | Attending: Internal Medicine | Admitting: Physical Therapy

## 2021-11-01 ENCOUNTER — Encounter (HOSPITAL_BASED_OUTPATIENT_CLINIC_OR_DEPARTMENT_OTHER): Payer: Self-pay | Admitting: Physical Therapy

## 2021-11-01 DIAGNOSIS — R2681 Unsteadiness on feet: Secondary | ICD-10-CM | POA: Insufficient documentation

## 2021-11-01 DIAGNOSIS — M5459 Other low back pain: Secondary | ICD-10-CM | POA: Diagnosis present

## 2021-11-01 DIAGNOSIS — M6281 Muscle weakness (generalized): Secondary | ICD-10-CM | POA: Insufficient documentation

## 2021-11-01 NOTE — Therapy (Signed)
OUTPATIENT PHYSICAL THERAPY TREATMENT NOTE      Patient Name: [name] MRN: [id number] DOB:January 06, 1946, 76 y.o., female Today's Date: 11/01/2021    PT End of Session - 11/01/21 0912     Visit Number 17    Number of Visits 20    Date for PT Re-Evaluation 11/08/21    Authorization Type UHC MCR    Progress Note Due on Visit 20    PT Start Time 0902    PT Stop Time 0944    PT Time Calculation (min) 42 min    Activity Tolerance Patient tolerated treatment well    Behavior During Therapy [facility] for tasks assessed/performed               Past Medical History:  Diagnosis Date   Acute blood loss as cause of postoperative anemia 04/03/2016   Anemia    hx of   Anxiety    takes Xanax daily as needed   Arthritis    Bronchitis    a month ago   Chronic back pain    stenosis   Chronic kidney disease    stage 3-sees Dr.J Patel   Class 3 obesity due to excess calories with body mass index (BMI) of 40.0 to 44.9 in adult    Diabetes mellitus    type II; takes Byetta and Invokana daily   Glaucoma    uses Eye Drops   Gout    takes Uloric daily   History of blood transfusion    no abnormal reaction    History of colon polyps    Hyperlipidemia    no meds taken now   Hypertension    takes HCTZ daily as well as Aldactone   Hypothyroidism    takes Synthroid daily   Pneumonia    many yrs ago   PONV (postoperative nausea and vomiting)    Primary localized osteoarthritis of right knee    Stress incontinence    Syncope    Weakness    numbness and tingling in right hip/eg/   Wears glasses    Wolf-Parkinson-White syndrome    takes Atenolol daily   Past Surgical History:  Procedure Laterality Date   ABDOMINAL HYSTERECTOMY     APPENDECTOMY     BACK SURGERY  2006   lumb fusion   BACK SURGERY  08/16/2014   L3-4 fusion   BREAST BIOPSY     BREAST ENHANCEMENT SURGERY Bilateral    BREAST EXCISIONAL BIOPSY Left    benign   CARDIAC CATHETERIZATION  >67yr ago   CARPAL TUNNEL  RELEASE Bilateral    x4   cataract surgery Bilateral    CESAREAN SECTION     x 3   CHOLECYSTECTOMY  1981   COLONOSCOPY     KNEE ARTHROSCOPY Right 2009   rt   RADIOLOGY WITH ANESTHESIA N/A 05/12/2020   Procedure: MRI LUMBAR SPINE WITH AND WITHOUT CONTRAST;  Surgeon: Radiologist, Medication, MD;  [place] [facility]  Service: Radiology;  Laterality: N/A;   RAZ procedure  10+yrs ago   REDUCTION MAMMAPLASTY Bilateral 2006   ROTATOR CUFF REPAIR Right    x 4   SHOULDER ARTHROSCOPY WITH ROTATOR CUFF REPAIR AND SUBACROMIAL DECOMPRESSION Right 06/10/2012   Procedure: SHOULDER ARTHROSCOPY WITH ROTATOR CUFF REPAIR AND SUBACROMIAL DECOMPRESSION;  Surgeon: RLorn Junes MD;  [place] [facility]  Service: Orthopedics;  Laterality: Right;  RIGHT SHOULDER ARTHROSCOPY, DECOMPRESSION SUBACROMIAL PARTIAL ACROMIOPLASTY WITH CORACOACROMIAL RELEASE,, DISTAL CLAVICULECTOMY , ROTATOR CUFF debriedment   SKIN  BIOPSY     removed moles all over body   THYROID SURGERY Right    right thyroid removed   TOTAL KNEE ARTHROPLASTY Right 04/02/2016   Procedure: RIGHT TOTAL KNEE ARTHROPLASTY;  Surgeon: Elsie Saas, MD;  Location: [place];  Service: Orthopedics;  Laterality: Right;   TRANSFORAMINAL LUMBAR INTERBODY FUSION (TLIF) WITH PEDICLE SCREW FIXATION 1 LEVEL Left 08/16/2020   Procedure: Left Lumbar two-three Transforaminal lumbar interbody fusion with exploration and extension of adjacent level fusion;  Surgeon: Erline Levine, MD;  Location: [place];  Service: Neurosurgery;  Laterality: Left;   Patient Active Problem List   Diagnosis Date Noted   Degenerative lumbar spinal stenosis 08/16/2020   Acute blood loss as cause of postoperative anemia 04/03/2016   Class 3 obesity due to excess calories with body mass index (BMI) of 40.0 to 44.9 in adult    Primary localized osteoarthritis of right knee    CKD (chronic kidney disease), stage III ([place]) 10/22/2015   Syncope 10/21/2015   Non-insulin dependent type 2  diabetes mellitus ([place]) 10/21/2015   Hypothyroidism 10/21/2015   Lumbar stenosis with neurogenic claudication 08/16/2014   Right rotator cuff tear    Anxiety    PONV (postoperative nausea and vomiting)    GERD (gastroesophageal reflux disease)    Arthritis    Thyroid disease    DIVERTICULOSIS-COLON 07/04/2009   Iron deficiency anemia 03/17/2009   PERSONAL HX COLONIC POLYPS 03/17/2009   SKIN RASH 02/23/2009   DIARRHEA 02/23/2009   FEVER, HX OF 02/23/2009   THYROID NODULE, LEFT 02/11/2009   Glaucoma 02/11/2009   Coronary atherosclerosis 02/11/2009   WOLFF (WOLFE)-PARKINSON-WHITE (WPW) SYNDROME 02/11/2009   DEGENERATIVE JOINT DISEASE, ANKLE 02/11/2009   Osteoarthritis of spine 02/11/2009   Essential hypertension 02/09/2009     PCP: Donnajean Lopes, MD   REFERRING PROVIDER: Donnajean Lopes, MD   REFERRING DIAG: R26.81 (ICD-10-CM) - Unsteadiness on feet   THERAPY DIAG:  Unsteadiness on feet   Muscle weakness (generalized)   Other low back pain   ONSET DATE: 4-5 months ago   SUBJECTIVE:    SUBJECTIVE STATEMENT: Pt reports she took it easy over the 4th of July.  She is feeling stronger, but feels she needs a little more guidance in water before d/c.  PERTINENT HISTORY: R TKA 2018, Lumbar fusion all 5. Left ankle OA   PAIN:  Are you having pain? No:  NPRS scale: Current 0/10 Pain location:  Pain description:  Aggravating factors: upright position/walking Relieving factors: slumping forward   PRECAUTIONS: Back and Fall   WEIGHT BEARING RESTRICTIONS No   FALLS:  Has patient fallen in last 6 months? Yes. Number of falls 2   LIVING ENVIRONMENT: Lives with: lives alone Lives in: House/apartment Stairs: Yes; 2 steps to enter. Level home  Has following equipment at home: Single point cane OCCUPATION: retired   PLOF: [place] stand in kitchen and cook, laundry, make my bed better, stand upright with less pain, be stronger      OBJECTIVE:    PATIENT SURVEYS:  FOTO 31  goal 48 09/18/21: 44   COGNITION:           Overall cognitive status: Within functional limits for tasks assessed                          SENSATION: WFL     POSTURE:  Forward flex , rounded shoulder   PALPATION: Moderately sensitive to palpation  throughout lumbar paraspinals     Lumbar ROM Limited secondary to pain Rotation and side bending limited by 50% R>L Extension 0; flex 30 degrees     LE MMT:   MMT Right 08/30/2021 Left 08/30/2021 Right 09/29/21 Left  09/29/21  Hip flexion 3- 3 3 3+  Hip extension 3- 3    Hip abduction        Hip adduction        Hip internal rotation        Hip external rotation        Knee flexion 3 4 3+ 4  Knee extension '4 5 4 5  '$ Ankle dorsiflexion        Ankle plantarflexion        Ankle inversion        Ankle eversion         (Blank rows = not tested)     FUNCTIONAL TESTS:  TUG 29  09/29/21: 21s, 11/01/21: 10.66s 30s STS 2 6MWT: 392 ft  09/29/21: 502 ft Berg balance:18/56 (High fall risk)  BERG Balance Test         Date: 08/30/21   09/29/21 Standing unsupported 3 4  Sitting with back unsupported but feet supported 4 4  Stand to sit 2 3  Transfers  1 3  Standing unsupported with eyes closed 2 3  Standing unsupported feet together 2 3  From standing position, reach forward with outstretched arm 0 1  From standing position, pick up object from floor 0 1  From standing position, turn and look behind over each shoulder 1 1  Turn 360 1 2  Standing unsupported, alternately place foot on step 0 2  Standing unsupported, one foot in front 0 0  Standing on one leg 0 0  Total:  18 30      GAIT: Distance walked: 500 to pool Assistive device utilized: none.  (Needs SPC for safety) Level of assistance: SBA Comments: Antalgic: cadence slowed, decreased step length, forward hip flex, dragging heels (decreased knee and hip flex during swing)       TODAY'S TREATMENT: Pt seen for aquatic therapy today.   Treatment took place in water 3.25-4 ft in depth at the [facility] pool. Temp of water was 90.  Pt entered/exited the pool via stairs independently with bilat (step to pattern) rail.  TUG timed 10.66sec  Warm up of multiple laps each - forward, backward and side stepping - with yellow hand buoys (on top of water/ under water) Tandem gait forward/ backward with yellow hand buoys Holding wall > noodle - curtsy taps  Trunk rotation, looking over shoulder with arms swinging Foot taps to 1st step x 8 x 2 without support Tandem stance x 30s each SLS, multiple trials up to 10 sec STS from 4th step x 10 without UE support no LOB Squoodle pull down feet together core stabilized x15     Pt requires buoyancy for support and to offload joints with strengthening exercises. Viscosity of the water is needed for resistance of strengthening; water current perturbations provides challenge to standing balance unsupported, requiring increased core activation.   PATIENT EDUCATION:  Education details: exercise progression Person educated: Patient Education method: Explanation Education comprehension: verbalized understanding     HOME EXERCISE PROGRAM: [id number] from last episode   ASSESSMENT:   CLINICAL IMPRESSION:  Pt demonstrated improved TUG at 10.66 sec.  She tolerated all exercises well, without increase in back pain.  She is most unsteady in Lt SLS and  with Lt leg in back of tandem stance.  Completed some of the low scoring BERG exercises in water today.  Pt is progressing very well towards goals.     ACTIVITY LIMITATIONS cleaning, community activity, laundry, and shopping.    PERSONAL FACTORS Age, Fitness, and 1-2 comorbidities: OA, lumbar fusions  are also affecting patient's functional outcome.      REHAB POTENTIAL: Good   CLINICAL DECISION MAKING: Evolving/moderate complexity   EVALUATION COMPLEXITY: Moderate     GOALS: Goals reviewed with patient? Yes   SHORT TERM  GOALS: Target date: 09/27/2021   Pt will make bed, changing linen in less than 15 minutes Baseline:1 hour;  30 mins 09/29/21; 15 mins 10/24/21 Goal status: Achieved   2.  Berg balance score to improve to 28/56 to demonstrate improving balance decreasing fall risk Baseline: 18/56 Goal status: Achieved   3.  Tug score will improve to less than 20s to demonstrate improving functional mobility Baseline: 29s;  21 sec on 09/29/21; 10.66 sec on 11/01/21 Goal status: Achieved        LONG TERM GOALS: Target date: 11/08/2021   Pt will be able to stand (and cook) for up >15 minutes Baseline:  Goal status: Achieved   2.  Pt will return to water aerobics with [place] [facility] attending 1-2x weekly demonstrating return to PLOF @ DC last episode Baseline:  Goal status: ongoing   3.  Foto to meet stated goal (54) Baseline: 34 Goal status: ongoing   4.  Hip strength to improve >or= 1 grade Baseline: 3- to 3/5 Goal status: INITIAL   5.  Pt will improve on Berg balance to 35/56 or > to demonstrate decreased fall risk Baseline:18/56  Goal status: INITIAL     PT FREQUENCY: 1-2x/week   PT DURATION: 10 weeks   PLANNED INTERVENTIONS: Therapeutic exercises, Therapeutic activity, Neuromuscular re-education, Balance training, Gait training, Patient/Family education, and Joint mobilization   PLAN FOR NEXT SESSION:  Assess LTGs; recert vs d/c.   Kerin Perna, PTA 11/01/21 9:44 AM

## 2021-11-03 ENCOUNTER — Encounter (HOSPITAL_BASED_OUTPATIENT_CLINIC_OR_DEPARTMENT_OTHER): Payer: Self-pay | Admitting: Physical Therapy

## 2021-11-03 ENCOUNTER — Ambulatory Visit (HOSPITAL_BASED_OUTPATIENT_CLINIC_OR_DEPARTMENT_OTHER): Payer: Medicare Other | Admitting: Physical Therapy

## 2021-11-03 DIAGNOSIS — R2681 Unsteadiness on feet: Secondary | ICD-10-CM | POA: Diagnosis not present

## 2021-11-03 DIAGNOSIS — M6281 Muscle weakness (generalized): Secondary | ICD-10-CM

## 2021-11-03 DIAGNOSIS — M5459 Other low back pain: Secondary | ICD-10-CM

## 2021-11-03 NOTE — Therapy (Signed)
OUTPATIENT PHYSICAL THERAPY TREATMENT NOTE  PHYSICAL THERAPY DISCHARGE SUMMARY  Visits from Start of Care: 18  Current functional level related to goals / functional outcomes: Indep with all functional mobility and ADL's   Remaining deficits: Minor infrequent LB tightness   Education / Equipment: Management of condition; HEP   Patient agrees to discharge. Patient goals were met. Patient is being discharged due to meeting the stated rehab goals.     Patient Name: [name] MRN: [id number] DOB:03-10-1946, 76 y.o., female Today's Date: 11/03/2021    PT End of Session - 11/03/21 0951     Visit Number 18    Number of Visits 20    Date for PT Re-Evaluation 11/08/21    Authorization Type UHC MCR    Progress Note Due on Visit 20    PT Start Time [phone]    PT Stop Time 1030    PT Time Calculation (min) 44 min    Activity Tolerance Patient tolerated treatment well    Behavior During Therapy [facility] for tasks assessed/performed               Past Medical History:  Diagnosis Date   Acute blood loss as cause of postoperative anemia 04/03/2016   Anemia    hx of   Anxiety    takes Xanax daily as needed   Arthritis    Bronchitis    a month ago   Chronic back pain    stenosis   Chronic kidney disease    stage 3-sees Dr.J Patel   Class 3 obesity due to excess calories with body mass index (BMI) of 40.0 to 44.9 in adult    Diabetes mellitus    type II; takes Byetta and Invokana daily   Glaucoma    uses Eye Drops   Gout    takes Uloric daily   History of blood transfusion    no abnormal reaction    History of colon polyps    Hyperlipidemia    no meds taken now   Hypertension    takes HCTZ daily as well as Aldactone   Hypothyroidism    takes Synthroid daily   Pneumonia    many yrs ago   PONV (postoperative nausea and vomiting)    Primary localized osteoarthritis of right knee    Stress incontinence    Syncope    Weakness    numbness and tingling in right  hip/eg/   Wears glasses    Wolf-Parkinson-White syndrome    takes Atenolol daily   Past Surgical History:  Procedure Laterality Date   ABDOMINAL HYSTERECTOMY     APPENDECTOMY     BACK SURGERY  2006   lumb fusion   BACK SURGERY  08/16/2014   L3-4 fusion   BREAST BIOPSY     BREAST ENHANCEMENT SURGERY Bilateral    BREAST EXCISIONAL BIOPSY Left    benign   CARDIAC CATHETERIZATION  >67yr ago   CARPAL TUNNEL RELEASE Bilateral    x4   cataract surgery Bilateral    CESAREAN SECTION     x 3   CHOLECYSTECTOMY  1981   COLONOSCOPY     KNEE ARTHROSCOPY Right 2009   rt   RADIOLOGY WITH ANESTHESIA N/A 05/12/2020   Procedure: MRI LUMBAR SPINE WITH AND WITHOUT CONTRAST;  Surgeon: Radiologist, Medication, MD;  Location: [facility]  Service: Radiology;  Laterality: N/A;   RAZ procedure  10+yrs ago   REDUCTION MAMMAPLASTY Bilateral 2006   ROTATOR CUFF REPAIR Right  x 4   SHOULDER ARTHROSCOPY WITH ROTATOR CUFF REPAIR AND SUBACROMIAL DECOMPRESSION Right 06/10/2012   Procedure: SHOULDER ARTHROSCOPY WITH ROTATOR CUFF REPAIR AND SUBACROMIAL DECOMPRESSION;  Surgeon: Lorn Junes, MD;  Location: [place];  Service: Orthopedics;  Laterality: Right;  RIGHT SHOULDER ARTHROSCOPY, DECOMPRESSION SUBACROMIAL PARTIAL ACROMIOPLASTY WITH CORACOACROMIAL RELEASE,, DISTAL CLAVICULECTOMY , ROTATOR CUFF debriedment   SKIN BIOPSY     removed moles all over body   THYROID SURGERY Right    right thyroid removed   TOTAL KNEE ARTHROPLASTY Right 04/02/2016   Procedure: RIGHT TOTAL KNEE ARTHROPLASTY;  Surgeon: Elsie Saas, MD;  Location: [place];  Service: Orthopedics;  Laterality: Right;   TRANSFORAMINAL LUMBAR INTERBODY FUSION (TLIF) WITH PEDICLE SCREW FIXATION 1 LEVEL Left 08/16/2020   Procedure: Left Lumbar two-three Transforaminal lumbar interbody fusion with exploration and extension of adjacent level fusion;  Surgeon: Erline Levine, MD;  Location: [place];  Service: Neurosurgery;  Laterality: Left;    Patient Active Problem List   Diagnosis Date Noted   Degenerative lumbar spinal stenosis 08/16/2020   Acute blood loss as cause of postoperative anemia 04/03/2016   Class 3 obesity due to excess calories with body mass index (BMI) of 40.0 to 44.9 in adult    Primary localized osteoarthritis of right knee    CKD (chronic kidney disease), stage III ([place]) 10/22/2015   Syncope 10/21/2015   Non-insulin dependent type 2 diabetes mellitus ([place]) 10/21/2015   Hypothyroidism 10/21/2015   Lumbar stenosis with neurogenic claudication 08/16/2014   Right rotator cuff tear    Anxiety    PONV (postoperative nausea and vomiting)    GERD (gastroesophageal reflux disease)    Arthritis    Thyroid disease    DIVERTICULOSIS-COLON 07/04/2009   Iron deficiency anemia 03/17/2009   PERSONAL HX COLONIC POLYPS 03/17/2009   SKIN RASH 02/23/2009   DIARRHEA 02/23/2009   FEVER, HX OF 02/23/2009   THYROID NODULE, LEFT 02/11/2009   Glaucoma 02/11/2009   Coronary atherosclerosis 02/11/2009   WOLFF (WOLFE)-PARKINSON-WHITE (WPW) SYNDROME 02/11/2009   DEGENERATIVE JOINT DISEASE, ANKLE 02/11/2009   Osteoarthritis of spine 02/11/2009   Essential hypertension 02/09/2009     PCP: Donnajean Lopes, MD   REFERRING PROVIDER: Donnajean Lopes, MD   REFERRING DIAG: R26.81 (ICD-10-CM) - Unsteadiness on feet   THERAPY DIAG:  Unsteadiness on feet   Muscle weakness (generalized)   Other low back pain   ONSET DATE: 4-5 months ago   SUBJECTIVE:    SUBJECTIVE STATEMENT: "I think I am doing well.  I have a lot more confidence.  Not afraid of falling anymore" PERTINENT HISTORY: R TKA 2018, Lumbar fusion all 5. Left ankle OA   PAIN:  Are you having pain? No:  NPRS scale: Current 0/10 Pain location:  Pain description:  Aggravating factors: upright position/walking Relieving factors: slumping forward   PRECAUTIONS: Back and Fall   WEIGHT BEARING RESTRICTIONS No   FALLS:  Has patient fallen in last 6  months? Yes. Number of falls 2   LIVING ENVIRONMENT: Lives with: lives alone Lives in: House/apartment Stairs: Yes; 2 steps to enter. Level home  Has following equipment at home: Single point cane OCCUPATION: retired   PLOF: [employer] stand in kitchen and cook, laundry, make my bed better, stand upright with less pain, be stronger     OBJECTIVE:    PATIENT SURVEYS:  FOTO 31  goal 48 09/18/21: 44 10/7721: 65   COGNITION:  Overall cognitive status: Within functional limits for tasks assessed                          SENSATION: WFL     POSTURE:  Forward flex , rounded shoulder   PALPATION: Moderately sensitive to palpation throughout lumbar paraspinals     Lumbar ROM Limited secondary to pain Rotation and side bending limited by 50% R>L Extension 0; flex 30 degrees     LE MMT:   MMT Right 08/30/2021 Left 08/30/2021 Right 09/29/21 Left  09/29/21 Right/left  Hip flexion 3- 3 3 3+ 4+/5 - 5-/5  Hip extension 3- 3     Hip abduction         Hip adduction         Hip internal rotation         Hip external rotation         Knee flexion 3 4 3+ 4 5/5 - 5/5  Knee extension _0 5/5 - 5/5  Ankle dorsiflexion         Ankle plantarflexion         Ankle inversion         Ankle eversion          (Blank rows = not tested)     FUNCTIONAL TESTS:  TUG 29  09/29/21: 21s, 11/01/21: 10.66s 30s STS 2;  11/03/21  10 6MWT: 392 ft  09/29/21: 502 ft Berg balance:18/56 (High fall risk)  11/03/21 54/56 low/no fall risk  BERG Balance Test         Date: 08/30/21   09/29/21 Standing unsupported _1 Sitting with back unsupported but feet supported _2 Stand to sit _3 Transfers  _4 Standing unsupported with eyes closed _5 Standing unsupported feet together _6 From standing position, reach forward with outstretched arm 0 1 4  From standing position, pick up object from floor 0 1 4  From standing position, turn and look behind over each shoulder _7 Turn 360 _8 Standing unsupported, alternately place foot on step 0 2 4  Standing unsupported, one foot in front 0 0 3  Standing on one leg 0 0 3  Total:  18 30 54      GAIT: Distance walked: 500 to pool Assistive device utilized: none.  (Needs SPC for safety) Level of assistance: SBA Comments: Antalgic: cadence slowed, decreased step length, forward hip flex, dragging heels (decreased knee and hip flex during swing)       TODAY'S TREATMENT: Re-assessment/DC Dc instructions   PATIENT EDUCATION:  Education details: exercise progression Person educated: Patient Education method: Explanation Education comprehension: verbalized understanding     HOME EXERCISE PROGRAM: [id number] from last episode   ASSESSMENT:   CLINICAL IMPRESSION:  Pt tested and has met all goals.  She has exceeded balance goal demonstrating low to no fall risk from high fall risk category at initial evaluation. She is pain free and has been for last few weeks. Hoyle Sauer has been coming to [facility] pool completing HEP on her own.  She has not yet returned to the water aerobic classes but plans to next week with goal to complete aquatic HEP x 2 weekly. Foto from 34pt to 65pt. She has made exceptional progress.  Has been a pleasure to work with.    ACTIVITY LIMITATIONS cleaning, community  activity, laundry, and shopping.    PERSONAL FACTORS Age, Fitness, and 1-2 comorbidities: OA, lumbar fusions  are also affecting patient's functional outcome.      REHAB POTENTIAL: Good   CLINICAL DECISION MAKING: Evolving/moderate complexity   EVALUATION COMPLEXITY: Moderate     GOALS: Goals reviewed with patient? Yes   SHORT TERM GOALS: Target date: 09/27/2021   Pt will make bed, changing linen in less than 15 minutes Baseline:1 hour;  30 mins 09/29/21; 15 mins 10/24/21 Goal status: Achieved   2.  Berg balance score to improve to 28/56 to demonstrate improving balance decreasing fall risk Baseline: 18/56 Goal  status: Achieved   3.  Tug score will improve to less than 20s to demonstrate improving functional mobility Baseline: 29s;  21 sec on 09/29/21; 10.66 sec on 11/01/21 Goal status: Achieved        LONG TERM GOALS: Target date: 11/08/2021   Pt will be able to stand (and cook) for up >15 minutes Baseline:  Goal status: Achieved   2.  Pt will return to water aerobics with [place] wellness attending 1-2x weekly demonstrating return to PLOF @ DC last episode Baseline:  Goal status: Achieved   3.  Foto to meet stated goal (54) Baseline: 34 Goal status: Achieved   4.  Hip strength to improve >or= 1 grade Baseline: 3- to 3/5 Goal status: Achieved   5.  Pt will improve on Berg balance to 35/56 or > to demonstrate decreased fall risk Baseline:18/56  Goal status: Achieved     PT FREQUENCY: 1-2x/week   PT DURATION: 10 weeks   PLANNED INTERVENTIONS: Therapeutic exercises, Therapeutic activity, Neuromuscular re-education, Balance training, Gait training, Patient/Family education, and Joint mobilization   PLAN FOR NEXT SESSION:  Assess LTGs; recert vs d/c.   Stanton Kidney Tharon Aquas) Emerly Prak MPT 11/03/21 1:23 PM

## 2021-12-04 ENCOUNTER — Other Ambulatory Visit (HOSPITAL_COMMUNITY): Payer: Self-pay

## 2021-12-25 ENCOUNTER — Other Ambulatory Visit (HOSPITAL_COMMUNITY): Payer: Self-pay

## 2022-01-22 ENCOUNTER — Other Ambulatory Visit (HOSPITAL_COMMUNITY): Payer: Self-pay

## 2022-01-23 ENCOUNTER — Emergency Department (HOSPITAL_COMMUNITY): Payer: Medicare Other

## 2022-01-23 ENCOUNTER — Other Ambulatory Visit: Payer: Self-pay

## 2022-01-23 ENCOUNTER — Encounter (HOSPITAL_COMMUNITY): Payer: Self-pay

## 2022-01-23 ENCOUNTER — Emergency Department (HOSPITAL_COMMUNITY)
Admission: EM | Admit: 2022-01-23 | Discharge: 2022-01-24 | Disposition: A | Discharge status: Home / Self Care | Payer: Medicare Other | Attending: Emergency Medicine | Admitting: Emergency Medicine

## 2022-01-23 DIAGNOSIS — I629 Nontraumatic intracranial hemorrhage, unspecified: Secondary | ICD-10-CM | POA: Diagnosis not present

## 2022-01-23 DIAGNOSIS — Z79899 Other long term (current) drug therapy: Secondary | ICD-10-CM | POA: Diagnosis not present

## 2022-01-23 DIAGNOSIS — R519 Headache, unspecified: Secondary | ICD-10-CM | POA: Diagnosis present

## 2022-01-23 DIAGNOSIS — Z7982 Long term (current) use of aspirin: Secondary | ICD-10-CM | POA: Insufficient documentation

## 2022-01-23 DIAGNOSIS — Z20822 Contact with and (suspected) exposure to covid-19: Secondary | ICD-10-CM | POA: Diagnosis not present

## 2022-01-23 LAB — CBC WITH DIFFERENTIAL/PLATELET
Abs Immature Granulocytes: 0.03 10*3/uL (ref 0.00–0.07)
Basophils Absolute: 0 10*3/uL (ref 0.0–0.1)
Basophils Relative: 1 %
Eosinophils Absolute: 0.2 10*3/uL (ref 0.0–0.5)
Eosinophils Relative: 2 %
HCT: 33.7 % — ABNORMAL LOW (ref 36.0–46.0)
Hemoglobin: 10.6 g/dL — ABNORMAL LOW (ref 12.0–15.0)
Immature Granulocytes: 1 %
Lymphocytes Relative: 37 %
Lymphs Abs: 2.4 10*3/uL (ref 0.7–4.0)
MCH: 27.6 pg (ref 26.0–34.0)
MCHC: 31.5 g/dL (ref 30.0–36.0)
MCV: 87.8 fL (ref 80.0–100.0)
Monocytes Absolute: 0.5 10*3/uL (ref 0.1–1.0)
Monocytes Relative: 7 %
Neutro Abs: 3.4 10*3/uL (ref 1.7–7.7)
Neutrophils Relative %: 52 %
Platelets: 269 10*3/uL (ref 150–400)
RBC: 3.84 MIL/uL — ABNORMAL LOW (ref 3.87–5.11)
RDW: 13.2 % (ref 11.5–15.5)
WBC: 6.5 10*3/uL (ref 4.0–10.5)
nRBC: 0 % (ref 0.0–0.2)

## 2022-01-23 LAB — I-STAT CHEM 8, ED
BUN: 31 mg/dL — ABNORMAL HIGH (ref 8–23)
Calcium, Ion: 0.81 mmol/L — CL (ref 1.15–1.40)
Chloride: 109 mmol/L (ref 98–111)
Creatinine, Ser: 1.2 mg/dL — ABNORMAL HIGH (ref 0.44–1.00)
Glucose, Bld: 105 mg/dL — ABNORMAL HIGH (ref 70–99)
HCT: 35 % — ABNORMAL LOW (ref 36.0–46.0)
Hemoglobin: 11.9 g/dL — ABNORMAL LOW (ref 12.0–15.0)
Potassium: 3.9 mmol/L (ref 3.5–5.1)
Sodium: 136 mmol/L (ref 135–145)
TCO2: 29 mmol/L (ref 22–32)

## 2022-01-23 LAB — COMPREHENSIVE METABOLIC PANEL
ALT: 12 U/L (ref 0–44)
AST: 15 U/L (ref 15–41)
Albumin: 3.8 g/dL (ref 3.5–5.0)
Alkaline Phosphatase: 96 U/L (ref 38–126)
Anion gap: 13 (ref 5–15)
BUN: 25 mg/dL — ABNORMAL HIGH (ref 8–23)
CO2: 23 mmol/L (ref 22–32)
Calcium: 9.7 mg/dL (ref 8.9–10.3)
Chloride: 104 mmol/L (ref 98–111)
Creatinine, Ser: 1.25 mg/dL — ABNORMAL HIGH (ref 0.44–1.00)
GFR, Estimated: 45 mL/min — ABNORMAL LOW (ref >60)
Glucose, Bld: 107 mg/dL — ABNORMAL HIGH (ref 70–99)
Potassium: 3.7 mmol/L (ref 3.5–5.1)
Sodium: 140 mmol/L (ref 135–145)
Total Bilirubin: 0.4 mg/dL (ref 0.3–1.2)
Total Protein: 7.4 g/dL (ref 6.5–8.1)

## 2022-01-23 LAB — CBC
HCT: 34.6 % — ABNORMAL LOW (ref 36.0–46.0)
Hemoglobin: 11.2 g/dL — ABNORMAL LOW (ref 12.0–15.0)
MCH: 28.1 pg (ref 26.0–34.0)
MCHC: 32.4 g/dL (ref 30.0–36.0)
MCV: 86.9 fL (ref 80.0–100.0)
Platelets: 242 10*3/uL (ref 150–400)
RBC: 3.98 MIL/uL (ref 3.87–5.11)
RDW: 13.1 % (ref 11.5–15.5)
WBC: 6.3 10*3/uL (ref 4.0–10.5)
nRBC: 0 % (ref 0.0–0.2)

## 2022-01-23 LAB — BASIC METABOLIC PANEL
Anion gap: 9 (ref 5–15)
BUN: 23 mg/dL (ref 8–23)
CO2: 26 mmol/L (ref 22–32)
Calcium: 9.6 mg/dL (ref 8.9–10.3)
Chloride: 105 mmol/L (ref 98–111)
Creatinine, Ser: 1.16 mg/dL — ABNORMAL HIGH (ref 0.44–1.00)
GFR, Estimated: 49 mL/min — ABNORMAL LOW (ref >60)
Glucose, Bld: 112 mg/dL — ABNORMAL HIGH (ref 70–99)
Potassium: 3.7 mmol/L (ref 3.5–5.1)
Sodium: 140 mmol/L (ref 135–145)

## 2022-01-23 LAB — DIFFERENTIAL
Abs Immature Granulocytes: 0.02 10*3/uL (ref 0.00–0.07)
Basophils Absolute: 0 10*3/uL (ref 0.0–0.1)
Basophils Relative: 1 %
Eosinophils Absolute: 0.1 10*3/uL (ref 0.0–0.5)
Eosinophils Relative: 2 %
Immature Granulocytes: 0 %
Lymphocytes Relative: 29 %
Lymphs Abs: 1.8 10*3/uL (ref 0.7–4.0)
Monocytes Absolute: 0.4 10*3/uL (ref 0.1–1.0)
Monocytes Relative: 7 %
Neutro Abs: 3.8 10*3/uL (ref 1.7–7.7)
Neutrophils Relative %: 61 %

## 2022-01-23 LAB — APTT: aPTT: 25 seconds (ref 24–36)

## 2022-01-23 LAB — SARS CORONAVIRUS 2 BY RT PCR: SARS Coronavirus 2 by RT PCR: NEGATIVE

## 2022-01-23 LAB — PROTIME-INR
INR: 1 (ref 0.8–1.2)
Prothrombin Time: 13.2 seconds (ref 11.4–15.2)

## 2022-01-23 MED ORDER — PROCHLORPERAZINE EDISYLATE 10 MG/2ML IJ SOLN
5.0000 mg | Freq: Once | INTRAMUSCULAR | Status: AC
  Administered 2022-01-23: 5 mg via INTRAVENOUS
  Filled 2022-01-23: qty 2

## 2022-01-23 MED ORDER — ONDANSETRON 4 MG PO TBDP
4.0000 mg | ORAL_TABLET | Freq: Once | ORAL | Status: AC
  Administered 2022-01-23: 4 mg via ORAL
  Filled 2022-01-23: qty 1

## 2022-01-23 MED ORDER — KETOROLAC TROMETHAMINE 15 MG/ML IJ SOLN
15.0000 mg | Freq: Once | INTRAMUSCULAR | Status: AC
  Administered 2022-01-23: 15 mg via INTRAVENOUS
  Filled 2022-01-23: qty 1

## 2022-01-23 MED ORDER — DIPHENHYDRAMINE HCL 50 MG/ML IJ SOLN
12.5000 mg | Freq: Once | INTRAMUSCULAR | Status: AC
  Administered 2022-01-23: 12.5 mg via INTRAVENOUS
  Filled 2022-01-23: qty 1

## 2022-01-23 NOTE — ED Notes (Signed)
Called to activate a code stroke

## 2022-01-23 NOTE — ED Notes (Signed)
Pt daughter notified staff that the patient stopped speaking or responding to her shortly after she was brought out to the lobby. Triage RN and PA notified.

## 2022-01-23 NOTE — ED Notes (Signed)
Dr. Goldston at bedside.  

## 2022-01-23 NOTE — Code Documentation (Signed)
Responded to Code Stroke called on pt in triage for headache. Code Stroke called at 2030 for aphasia and R side weakness, LSN-2000. CBG-112, NIH-13, CT head negative for acute changes. D/t contrast allergy, pt transported to MRI. Pt arrived in MRI at 2048. TNK not given-MRI negative for stroke.

## 2022-01-23 NOTE — ED Notes (Addendum)
Pt taken to MRI from CT with [place] RN and Stroke RN - PT says she is allergic to contrast

## 2022-01-23 NOTE — ED Notes (Addendum)
Noted Spo2 88% in MRI, 2L [place] applied and O2 improved to 99%

## 2022-01-23 NOTE — ED Triage Notes (Signed)
Pt has sudden headache that started 2 hours ago. Initial bp 180s. Recently had covid and flu vaccines and has been having some nausea. No neuro deficits, no falls, no thinners

## 2022-01-23 NOTE — ED Notes (Addendum)
Pt back in room at this time - Family at bedside - Pt given socks at this time per request - Family requesting update from MD, Dr Regenia Skeeter and Dr Leonel Ramsay notified at this time

## 2022-01-23 NOTE — ED Notes (Signed)
Family updated by Dr Regenia Skeeter

## 2022-01-23 NOTE — ED Provider Triage Note (Signed)
Emergency Medicine Provider Triage Evaluation Note  [name] , a 76 y.o. female  was evaluated in triage.  Pt complains of acute onset of headache.  Patient presents by EMS.  Patient reports that approximately 530/6:00 she had an acute onset of severe headache.  Patient reports associated nausea and vomiting.  No chest pain or shortness of breath.  She reports generalized weakness.  She reports getting a COVID and flu shot at the beginning of this week and has not felt well since then.  Patient reports that her headache has somewhat subsided without any medications.  Review of Systems  Positive: Headache, vomiting, weakness Negative: Fever  Physical Exam  BP (!) 143/89 (BP Location: Right Arm)   Pulse 75   Temp 97.8 F (36.6 C) (Oral)   Resp 16   Ht '5\' 3"'$  (1.6 m)   Wt 100.2 kg   LMP  (LMP Unknown)   SpO2 99%   BMI 39.13 kg/m  Gen:   Awake, no distress   Resp:  Normal effort  MSK:   Moves extremities without difficulty  Other:  Cranial nerves II through XII grossly intact.  5 out of 5 strength in upper and lower extremities.  Speech is clear.  Smile symmetric.  Alert and oriented.  Answers questions appropriately.  No nuchal rigidity.  Medical Decision Making  Medically screening exam initiated at 8:07 PM.  Appropriate orders placed.  [name] was informed that the remainder of the evaluation will be completed by another provider, this initial triage assessment does not replace that evaluation, and the importance of remaining in the ED until their evaluation is complete.  Patient reports acute onset of severe headache about 2 hours ago.  COVID and flu shot was given earlier this week.  Patient reported associated vomiting.  No nuchal rigidity suspect meningitis.  Vital signs reassuring suffer some mild hypertension.  No focal neurological deficit.  Patient does not take any blood thinners.  Given that the headache is severe and acute onset 2 hours ago will obtain CT imaging  without contrast to assess for intracranial hemorrhage.  Patient provided Zofran for nausea and vomiting.   Doristine Devoid, PA-C 01/23/22 2010

## 2022-01-23 NOTE — ED Provider Notes (Signed)
[facility] EMERGENCY DEPARTMENT Provider Note   CSN: [id number] Arrival date & time: 01/23/22  1951     History  Chief Complaint  Patient presents with   Headache    [name] is a 76 y.o. female.  HPI 76 year old female presents [place] with an acute headache.  [name] was called a code stroke on reevaluation from triage as [name] went from having a significant headache for 2 hours to having acute weakness and difficulty speaking.  When I am initially speaking to her the history is quite limited as [name] is having speech difficulty.  While patient was in MRI, I talked to the daughter over the phone and got more information.  [name] got a COVID/flu vaccine 1 week ago and has felt nauseous and like [name] has a case of COVID.  However the headache started all of a sudden today.  Home Medications Prior to Admission medications   Medication Sig Start Date End Date Taking? Authorizing Provider  ALPRAZolam Duanne Moron) 0.5 MG tablet Take 0.5 mg by mouth 3 (three) times daily as needed for anxiety.     [provider]  aspirin 81 MG chewable tablet Chew 81 mg by mouth daily.    [provider]  atenolol (TENORMIN) 25 MG tablet Take 25 mg by mouth daily. 06/03/17   [provider]  ciclopirox (PENLAC) 8 % solution Apply topically at bedtime. Apply over nail and surrounding skin. Apply daily over previous coat. After seven  days, remove with alcohol and continue cycle. 06/13/21   McDonald, Stephan Minister, DPM  COVID-19 mRNA bivalent vaccine, Pfizer, (PFIZER COVID-19 VAC BIVALENT) injection Inject into the muscle. 02/23/21   Carlyle Basques, MD  dicyclomine (BENTYL) 10 MG capsule Take 1 capsule (10 mg total) by mouth 3 (three) times daily before meals. 01/26/21   Noralyn Pick, NP  febuxostat (ULORIC) 40 MG tablet Take 40 mg by mouth daily.     [provider]  furosemide (LASIX) 20 MG tablet Take 20 mg by mouth daily. 06/09/21   [provider]   HYDROmorphone (DILAUDID) 2 MG tablet Take 0.5-1 tablets (1-2 mg total) by mouth every 4 (four) hours as needed for severe pain. 08/18/20   Marvis Moeller, NP  latanoprost (XALATAN) 0.005 % ophthalmic solution Place 1 drop into both eyes at bedtime.    [provider]  levothyroxine (SYNTHROID, LEVOTHROID) 50 MCG tablet Take 50-100 mcg by mouth See admin instructions. Take 50 mcg daily, except 100 mcg by mouth on Sunday    [provider]  methocarbamol (ROBAXIN) 500 MG tablet Take 1 tablet (500 mg total) by mouth every 6 (six) hours as needed for muscle spasms. 08/18/20   Marvis Moeller, NP  Multiple Vitamins-Minerals (MULTIVITAMIN WITH MINERALS) tablet Take 1 tablet by mouth daily.    [provider]  pravastatin (PRAVACHOL) 20 MG tablet Take 20 mg by mouth every evening. 12/07/14   [provider]  telmisartan (MICARDIS) 40 MG tablet Take 40 mg by mouth daily. 03/16/20   [provider]  tirzepatide Darcel Bayley) 12.5 MG/0.5ML Pen Inject 1 pen as directed into the skin once weekly 06/13/21     tirzepatide [facility]) 12.5 MG/0.5ML Pen Inject 12.5 mg under the skin once a week 09/04/21     tirzepatide (MOUNJARO) 12.5 MG/0.5ML Pen Inject 12.5 mg into the skin once a week 09/12/21         Allergies    Contrast media [iodinated contrast media], Adhesive [tape], Esomeprazole  magnesium, Morphine and related, Talwin [pentazocine], Acetaminophen, Ceftriaxone, Allopurinol, Codeine, Colchicine, Hydrocodone, Meperidine hcl, Oxycodone, and Pentazocine lactate    Review of Systems   Review of Systems  Physical Exam Updated Vital Signs BP 134/78   Pulse (!) 52   Temp 97.8 F (36.6 C) (Oral)   Resp 20   Ht '5\' 3"'$  (1.6 m)   Wt 94.1 kg   LMP  (LMP Unknown)   SpO2 98%   BMI 36.76 kg/m  Physical Exam Vitals and nursing note reviewed.  Constitutional:      Appearance: [name] is well-developed.  HENT:     Head: Normocephalic and atraumatic.  Cardiovascular:      Rate and Rhythm: Normal rate and regular rhythm.  Pulmonary:     Effort: Pulmonary effort is normal.  Abdominal:     General: There is no distension.  Skin:    General: Skin is warm and dry.  Neurological:     Mental Status: [name] is alert.     Comments: Patient has soft speech that is occasionally stuttering or slow to start.  No facial droop.  [name] is diffusely weak though worse in the right upper extremity compared to the left upper extremity.  Both lower extremities have a hard time getting off the stretcher.     ED Results / Procedures / Treatments   Labs (all labs ordered are listed, but only abnormal results are displayed) Labs Reviewed  CBC WITH DIFFERENTIAL/PLATELET - Abnormal; Notable for the following components:      Result Value   RBC 3.84 (*)    Hemoglobin 10.6 (*)    HCT 33.7 (*)    All other components within normal limits  BASIC METABOLIC PANEL - Abnormal; Notable for the following components:   Glucose, Bld 112 (*)    Creatinine, Ser 1.16 (*)    GFR, Estimated 49 (*)    All other components within normal limits  CBC - Abnormal; Notable for the following components:   Hemoglobin 11.2 (*)    HCT 34.6 (*)    All other components within normal limits  COMPREHENSIVE METABOLIC PANEL - Abnormal; Notable for the following components:   Glucose, Bld 107 (*)    BUN 25 (*)    Creatinine, Ser 1.25 (*)    GFR, Estimated 45 (*)    All other components within normal limits  I-STAT CHEM 8, ED - Abnormal; Notable for the following components:   BUN 31 (*)    Creatinine, Ser 1.20 (*)    Glucose, Bld 105 (*)    Calcium, Ion 0.81 (*)    Hemoglobin 11.9 (*)    HCT 35.0 (*)    All other components within normal limits  SARS CORONAVIRUS 2 BY RT PCR  PROTIME-INR  APTT  DIFFERENTIAL    EKG EKG Interpretation  Date/Time:  Tuesday January 23 2022 22:19:56 EDT Ventricular Rate:  52 PR Interval:  201 QRS Duration: 101 QT Interval:  471 QTC Calculation: 438 R  Axis:   10 Text Interpretation: Sinus rhythm Left ventricular hypertrophy Borderline T abnormalities, inferior leads Confirmed by Sherwood Gambler [phone]) on 01/23/2022 10:57:26 PM  Radiology MR BRAIN WO CONTRAST  Result Date: 01/23/2022 CLINICAL DATA:  Acute onset headache EXAM: MRI HEAD WITHOUT CONTRAST MRA HEAD WITHOUT CONTRAST MRV HEAD WITHOUT CONTRAST MRA NECK WITHOUT CONTRAST TECHNIQUE: Multiplanar, multi-echo pulse sequences of the brain and surrounding structures were acquired without intravenous contrast. Angiographic images of the Circle of Willis were acquired using MRA technique without intravenous  contrast. Angiographic images of the intracranial venous structures were acquired using MRV technique without intravenous contrast. Angiographic images of the neck were acquired using MRA technique without intravenous contrast. Carotid stenosis measurements (when applicable) are obtained utilizing NASCET criteria, using the distal internal carotid diameter as the denominator. COMPARISON:  06/08/2008 MRI head, correlation is made with CT head 01/23/2022 FINDINGS: MRI HEAD FINDINGS Brain: No restricted diffusion to suggest acute or subacute infarct. No acute hemorrhage, mass, mass effect, or midline shift. No hydrocephalus or extra-axial collection. No hemosiderin deposition to suggest remote hemorrhage. Scattered T2 hyperintense signal in the periventricular white matter and pons, likely the sequela of mild-to-moderate chronic small vessel ischemic disease, which has progressed compared to 2010, including a focus in the right basal ganglia that correlates with the hypodensity noted on the same-day CT. Redemonstrated CSF density lesion within the sella turcica, which flattens the pituitary consistent with arachnoid cyst. The pituitary is slightly less compress than on the 2010 exam. Vascular: Please see MRA findings below. Skull and upper cervical spine: Normal marrow signal. Sinuses/Orbits: No acute  finding. Status post bilateral lens replacements. Other: Fluid in right mastoid air cells. MRA HEAD FINDINGS Anterior circulation: Both internal carotid arteries are patent to the termini, without significant stenosis. A1 segments patent. Normal anterior communicating artery. Anterior cerebral arteries are patent to their distal aspects. No M1 stenosis or occlusion. Normal MCA bifurcations. Distal MCA branches perfused and symmetric. Posterior circulation: Vertebral arteries patent to the vertebrobasilar junction without stenosis. Basilar patent to its distal aspect. Superior cerebellar arteries patent bilaterally. Patent P1 segments. PCAs perfused to their distal aspects without stenosis. Suspect a diminutive right posterior communicating artery. Anatomic variants: None significant MRV HEAD FINDINGS There is no evidence of dural venous sinus or deep cerebral vein thrombosis. Narrowing of the distal transverse sinuses near the transverse-sigmoid junction (series 9, image 94). MRA NECK FINDINGS Aortic arch: Limited visualization. Right carotid system: No evidence of dissection, occlusion, or hemodynamically significant stenosis (greater than 50%). Left carotid system: No evidence of dissection, occlusion, or hemodynamically significant stenosis (greater than 50%). Vertebral arteries: No evidence of dissection, occlusion, or hemodynamically significant stenosis (greater than 50%). Other: None. IMPRESSION: 1. No acute intracranial process. No evidence of acute or subacute infarct. 2. No evidence of venous sinus thrombosis. Narrowing of the distal transverse sinuses near the transverse sigmoid junction which is nonspecific but can be seen in the setting of idiopathic intracranial hypertension. Correlate with symptoms and consider a lumbar puncture with opening pressure if clinically indicated. 3. Redemonstrated CSF density lesion within the sella turcica, with decreased flattening of the pituitary, favored to represent  an arachnoid cyst. 4.  No intracranial large vessel occlusion or significant stenosis. 5.  No hemodynamically significant stenosis in the neck. Electronically Signed   By: Merilyn Baba M.D.   On: 01/23/2022 22:50   MR ANGIO HEAD WO CONTRAST  Result Date: 01/23/2022 CLINICAL DATA:  Acute onset headache EXAM: MRI HEAD WITHOUT CONTRAST MRA HEAD WITHOUT CONTRAST MRV HEAD WITHOUT CONTRAST MRA NECK WITHOUT CONTRAST TECHNIQUE: Multiplanar, multi-echo pulse sequences of the brain and surrounding structures were acquired without intravenous contrast. Angiographic images of the Circle of Willis were acquired using MRA technique without intravenous contrast. Angiographic images of the intracranial venous structures were acquired using MRV technique without intravenous contrast. Angiographic images of the neck were acquired using MRA technique without intravenous contrast. Carotid stenosis measurements (when applicable) are obtained utilizing NASCET criteria, using the distal internal carotid diameter as the denominator. COMPARISON:  06/08/2008 MRI head, correlation is made with CT head 01/23/2022 FINDINGS: MRI HEAD FINDINGS Brain: No restricted diffusion to suggest acute or subacute infarct. No acute hemorrhage, mass, mass effect, or midline shift. No hydrocephalus or extra-axial collection. No hemosiderin deposition to suggest remote hemorrhage. Scattered T2 hyperintense signal in the periventricular white matter and pons, likely the sequela of mild-to-moderate chronic small vessel ischemic disease, which has progressed compared to 2010, including a focus in the right basal ganglia that correlates with the hypodensity noted on the same-day CT. Redemonstrated CSF density lesion within the sella turcica, which flattens the pituitary consistent with arachnoid cyst. The pituitary is slightly less compress than on the 2010 exam. Vascular: Please see MRA findings below. Skull and upper cervical spine: Normal marrow signal.  Sinuses/Orbits: No acute finding. Status post bilateral lens replacements. Other: Fluid in right mastoid air cells. MRA HEAD FINDINGS Anterior circulation: Both internal carotid arteries are patent to the termini, without significant stenosis. A1 segments patent. Normal anterior communicating artery. Anterior cerebral arteries are patent to their distal aspects. No M1 stenosis or occlusion. Normal MCA bifurcations. Distal MCA branches perfused and symmetric. Posterior circulation: Vertebral arteries patent to the vertebrobasilar junction without stenosis. Basilar patent to its distal aspect. Superior cerebellar arteries patent bilaterally. Patent P1 segments. PCAs perfused to their distal aspects without stenosis. Suspect a diminutive right posterior communicating artery. Anatomic variants: None significant MRV HEAD FINDINGS There is no evidence of dural venous sinus or deep cerebral vein thrombosis. Narrowing of the distal transverse sinuses near the transverse-sigmoid junction (series 9, image 94). MRA NECK FINDINGS Aortic arch: Limited visualization. Right carotid system: No evidence of dissection, occlusion, or hemodynamically significant stenosis (greater than 50%). Left carotid system: No evidence of dissection, occlusion, or hemodynamically significant stenosis (greater than 50%). Vertebral arteries: No evidence of dissection, occlusion, or hemodynamically significant stenosis (greater than 50%). Other: None. IMPRESSION: 1. No acute intracranial process. No evidence of acute or subacute infarct. 2. No evidence of venous sinus thrombosis. Narrowing of the distal transverse sinuses near the transverse sigmoid junction which is nonspecific but can be seen in the setting of idiopathic intracranial hypertension. Correlate with symptoms and consider a lumbar puncture with opening pressure if clinically indicated. 3. Redemonstrated CSF density lesion within the sella turcica, with decreased flattening of the  pituitary, favored to represent an arachnoid cyst. 4.  No intracranial large vessel occlusion or significant stenosis. 5.  No hemodynamically significant stenosis in the neck. Electronically Signed   By: Merilyn Baba M.D.   On: 01/23/2022 22:50   MR ANGIO NECK WO CONTRAST  Result Date: 01/23/2022 CLINICAL DATA:  Acute onset headache EXAM: MRI HEAD WITHOUT CONTRAST MRA HEAD WITHOUT CONTRAST MRV HEAD WITHOUT CONTRAST MRA NECK WITHOUT CONTRAST TECHNIQUE: Multiplanar, multi-echo pulse sequences of the brain and surrounding structures were acquired without intravenous contrast. Angiographic images of the Circle of Willis were acquired using MRA technique without intravenous contrast. Angiographic images of the intracranial venous structures were acquired using MRV technique without intravenous contrast. Angiographic images of the neck were acquired using MRA technique without intravenous contrast. Carotid stenosis measurements (when applicable) are obtained utilizing NASCET criteria, using the distal internal carotid diameter as the denominator. COMPARISON:  06/08/2008 MRI head, correlation is made with CT head 01/23/2022 FINDINGS: MRI HEAD FINDINGS Brain: No restricted diffusion to suggest acute or subacute infarct. No acute hemorrhage, mass, mass effect, or midline shift. No hydrocephalus or extra-axial collection. No hemosiderin deposition to suggest remote hemorrhage. Scattered T2 hyperintense signal in  the periventricular white matter and pons, likely the sequela of mild-to-moderate chronic small vessel ischemic disease, which has progressed compared to 2010, including a focus in the right basal ganglia that correlates with the hypodensity noted on the same-day CT. Redemonstrated CSF density lesion within the sella turcica, which flattens the pituitary consistent with arachnoid cyst. The pituitary is slightly less compress than on the 2010 exam. Vascular: Please see MRA findings below. Skull and upper cervical  spine: Normal marrow signal. Sinuses/Orbits: No acute finding. Status post bilateral lens replacements. Other: Fluid in right mastoid air cells. MRA HEAD FINDINGS Anterior circulation: Both internal carotid arteries are patent to the termini, without significant stenosis. A1 segments patent. Normal anterior communicating artery. Anterior cerebral arteries are patent to their distal aspects. No M1 stenosis or occlusion. Normal MCA bifurcations. Distal MCA branches perfused and symmetric. Posterior circulation: Vertebral arteries patent to the vertebrobasilar junction without stenosis. Basilar patent to its distal aspect. Superior cerebellar arteries patent bilaterally. Patent P1 segments. PCAs perfused to their distal aspects without stenosis. Suspect a diminutive right posterior communicating artery. Anatomic variants: None significant MRV HEAD FINDINGS There is no evidence of dural venous sinus or deep cerebral vein thrombosis. Narrowing of the distal transverse sinuses near the transverse-sigmoid junction (series 9, image 94). MRA NECK FINDINGS Aortic arch: Limited visualization. Right carotid system: No evidence of dissection, occlusion, or hemodynamically significant stenosis (greater than 50%). Left carotid system: No evidence of dissection, occlusion, or hemodynamically significant stenosis (greater than 50%). Vertebral arteries: No evidence of dissection, occlusion, or hemodynamically significant stenosis (greater than 50%). Other: None. IMPRESSION: 1. No acute intracranial process. No evidence of acute or subacute infarct. 2. No evidence of venous sinus thrombosis. Narrowing of the distal transverse sinuses near the transverse sigmoid junction which is nonspecific but can be seen in the setting of idiopathic intracranial hypertension. Correlate with symptoms and consider a lumbar puncture with opening pressure if clinically indicated. 3. Redemonstrated CSF density lesion within the sella turcica, with  decreased flattening of the pituitary, favored to represent an arachnoid cyst. 4.  No intracranial large vessel occlusion or significant stenosis. 5.  No hemodynamically significant stenosis in the neck. Electronically Signed   By: Merilyn Baba M.D.   On: 01/23/2022 22:50   MR Venogram Head  Result Date: 01/23/2022 CLINICAL DATA:  Acute onset headache EXAM: MRI HEAD WITHOUT CONTRAST MRA HEAD WITHOUT CONTRAST MRV HEAD WITHOUT CONTRAST MRA NECK WITHOUT CONTRAST TECHNIQUE: Multiplanar, multi-echo pulse sequences of the brain and surrounding structures were acquired without intravenous contrast. Angiographic images of the Circle of Willis were acquired using MRA technique without intravenous contrast. Angiographic images of the intracranial venous structures were acquired using MRV technique without intravenous contrast. Angiographic images of the neck were acquired using MRA technique without intravenous contrast. Carotid stenosis measurements (when applicable) are obtained utilizing NASCET criteria, using the distal internal carotid diameter as the denominator. COMPARISON:  06/08/2008 MRI head, correlation is made with CT head 01/23/2022 FINDINGS: MRI HEAD FINDINGS Brain: No restricted diffusion to suggest acute or subacute infarct. No acute hemorrhage, mass, mass effect, or midline shift. No hydrocephalus or extra-axial collection. No hemosiderin deposition to suggest remote hemorrhage. Scattered T2 hyperintense signal in the periventricular white matter and pons, likely the sequela of mild-to-moderate chronic small vessel ischemic disease, which has progressed compared to 2010, including a focus in the right basal ganglia that correlates with the hypodensity noted on the same-day CT. Redemonstrated CSF density lesion within the sella turcica, which flattens the pituitary  consistent with arachnoid cyst. The pituitary is slightly less compress than on the 2010 exam. Vascular: Please see MRA findings below. Skull  and upper cervical spine: Normal marrow signal. Sinuses/Orbits: No acute finding. Status post bilateral lens replacements. Other: Fluid in right mastoid air cells. MRA HEAD FINDINGS Anterior circulation: Both internal carotid arteries are patent to the termini, without significant stenosis. A1 segments patent. Normal anterior communicating artery. Anterior cerebral arteries are patent to their distal aspects. No M1 stenosis or occlusion. Normal MCA bifurcations. Distal MCA branches perfused and symmetric. Posterior circulation: Vertebral arteries patent to the vertebrobasilar junction without stenosis. Basilar patent to its distal aspect. Superior cerebellar arteries patent bilaterally. Patent P1 segments. PCAs perfused to their distal aspects without stenosis. Suspect a diminutive right posterior communicating artery. Anatomic variants: None significant MRV HEAD FINDINGS There is no evidence of dural venous sinus or deep cerebral vein thrombosis. Narrowing of the distal transverse sinuses near the transverse-sigmoid junction (series 9, image 94). MRA NECK FINDINGS Aortic arch: Limited visualization. Right carotid system: No evidence of dissection, occlusion, or hemodynamically significant stenosis (greater than 50%). Left carotid system: No evidence of dissection, occlusion, or hemodynamically significant stenosis (greater than 50%). Vertebral arteries: No evidence of dissection, occlusion, or hemodynamically significant stenosis (greater than 50%). Other: None. IMPRESSION: 1. No acute intracranial process. No evidence of acute or subacute infarct. 2. No evidence of venous sinus thrombosis. Narrowing of the distal transverse sinuses near the transverse sigmoid junction which is nonspecific but can be seen in the setting of idiopathic intracranial hypertension. Correlate with symptoms and consider a lumbar puncture with opening pressure if clinically indicated. 3. Redemonstrated CSF density lesion within the sella  turcica, with decreased flattening of the pituitary, favored to represent an arachnoid cyst. 4.  No intracranial large vessel occlusion or significant stenosis. 5.  No hemodynamically significant stenosis in the neck. Electronically Signed   By: Merilyn Baba M.D.   On: 01/23/2022 22:50   CT HEAD CODE STROKE WO CONTRAST  Result Date: 01/23/2022 CLINICAL DATA:  Code stroke.  Headache, sudden, severe EXAM: CT HEAD WITHOUT CONTRAST TECHNIQUE: Contiguous axial images were obtained from the base of the skull through the vertex without intravenous contrast. RADIATION DOSE REDUCTION: This exam was performed according to the departmental dose-optimization program which includes automated exposure control, adjustment of the mA and/or kV according to patient size and/or use of iterative reconstruction technique. COMPARISON:  MRI head 06/08/2008 FINDINGS: The images for this study are under accession [id number] CHL. Brain: Hypodensity in the anterior right caudate (series 5, image 17), which is age indeterminate. No acute cortical infarction. No evidence of acute hemorrhage, cerebral edema, mass, mass effect, or midline shift. No hydrocephalus or extra-axial collection. Vascular: No hyperdense vessel. Skull: Negative for fracture or focal lesion. Sinuses/Orbits: No acute finding. Status post bilateral lens replacements. Other: The mastoid air cells are well aerated. ASPECTS [facility] Stroke Program Early CT Score) - Ganglionic level infarction (caudate, lentiform nuclei, internal capsule, insula, M1-M3 cortex): 6 - Supraganglionic infarction (M4-M6 cortex): 3 Total score (0-10 with 10 being normal): 9 IMPRESSION: 1. Hypodensity in the right caudate, which is technically age indeterminate. No acute cortical infarct. No intracranial hemorrhage. 2. ASPECTS is 9 Code stroke imaging results were communicated on 01/23/2022 at 8:42 pm to provider Dr. Leonel Ramsay via secure text paging. Electronically Signed   By: Merilyn Baba M.D.    On: 01/23/2022 20:44    Procedures Procedures    Medications Ordered in ED Medications  ondansetron (ZOFRAN-ODT)  disintegrating tablet 4 mg (4 mg Oral Given 01/23/22 2006)  ketorolac (TORADOL) 15 MG/ML injection 15 mg (15 mg Intravenous Given 01/23/22 2317)  prochlorperazine (COMPAZINE) injection 5 mg (5 mg Intravenous Given 01/23/22 2314)  diphenhydrAMINE (BENADRYL) injection 12.5 mg (12.5 mg Intravenous Given 01/23/22 2316)    ED Course/ Medical Decision Making/ A&P                           Medical Decision Making Amount and/or Complexity of Data Reviewed Labs:     Details: No significant electrolyte disturbance.  Mild anemia. Radiology: independent interpretation performed.    Details: CT head without head bleed.  Noncontrasted MRI without acute stroke. ECG/medicine tests: independent interpretation performed.    Details: No acute ischemia.  No arrhythmia.  Risk Prescription drug management.   Patient was called a code stroke in triage after [name] went from having a headache to having acute weakness and difficulty speaking.  This is when I first saw her as above.  No acute stroke on emergent head CT or MRI.  Cannot tolerate IV contrast so no CTA.  Neurology has ordered multiple other MRIs which are all unremarkable as far as venous sinus thrombosis, acute arterial occlusion.  There is a nonspecific finding noted and I discussed this with Dr. Leonel Ramsay and feels this is highly unlikely to be idiopathic intracranial hypertension and does not feel [name] needs an LP.  He recommends treating with Compazine, Benadryl, Toradol as this is likely a complicated migraine.  Patient symptoms seem to be slightly improving at the time [name] is getting these meds. Care transferred to Dr. Christy Gentles.        Final Clinical Impression(s) / ED Diagnoses Final diagnoses:  None    Rx / DC Orders ED Discharge Orders     None         Sherwood Gambler, MD 01/23/22 [phone]

## 2022-01-23 NOTE — ED Notes (Signed)
Pt remains in MRI with stroke RN and [place]

## 2022-01-24 NOTE — ED Notes (Signed)
Pt ambulated with this RN - Pt reports she feels a little weak but overall feels much better and is ready to go home - MD notified

## 2022-01-24 NOTE — Consult Note (Signed)
Neurology Consultation Reason for Consult: Weakness Referring Physician: Manya Silvas, S  CC: Weakness  History is obtained from: Patient  HPI: [name] is a 76 y.o. female with a history of diabetes, hyperlipidemia, hypertension who presents with right-sided weakness.  She got a COVID-vaccine yesterday, and has felt a little bit off since that time.  Today, she got a headache which prompted her to come for evaluation in the emergency department.  While she was waiting in the emergency department, she had sudden onset of inability to speak and right >left weakness.  Because of the sudden change a code stroke was activated.   LKW: Unclear given nonspecific symptoms since yesterday tpa given?: no, not a stroke Past Medical History:  Diagnosis Date   Acute blood loss as cause of postoperative anemia 04/03/2016   Anemia    hx of   Anxiety    takes Xanax daily as needed   Arthritis    Bronchitis    a month ago   Chronic back pain    stenosis   Chronic kidney disease    stage 3-sees Dr.J Patel   Class 3 obesity due to excess calories with body mass index (BMI) of 40.0 to 44.9 in adult    Diabetes mellitus    type II; takes Byetta and Invokana daily   Glaucoma    uses Eye Drops   Gout    takes Uloric daily   History of blood transfusion    no abnormal reaction    History of colon polyps    Hyperlipidemia    no meds taken now   Hypertension    takes HCTZ daily as well as Aldactone   Hypothyroidism    takes Synthroid daily   Pneumonia    many yrs ago   PONV (postoperative nausea and vomiting)    Primary localized osteoarthritis of right knee    Stress incontinence    Syncope    Weakness    numbness and tingling in right hip/eg/   Wears glasses    Wolf-Parkinson-White syndrome    takes Atenolol daily     Family History  Problem Relation Age of Onset   Diabetes Mother    Heart attack Father    Hypertension Other    Arthritis Other    Breast cancer Neg Hx    Colon  cancer Neg Hx    Esophageal cancer Neg Hx    Rectal cancer Neg Hx    Stomach cancer Neg Hx      Social History:  reports that she quit smoking about 45 years ago. She has a 7.50 pack-year smoking history. She has never used smokeless tobacco. She reports that she does not drink alcohol and does not use drugs.   Exam: Current vital signs: BP 112/72   Pulse (!) 59   Temp 97.9 F (36.6 C) (Oral)   Resp 19   Ht '5\' 3"'$  (1.6 m)   Wt 94.1 kg   LMP  (LMP Unknown)   SpO2 96%   BMI 36.76 kg/m  Vital signs in last 24 hours: Temp:  [97.8 F (36.6 C)-97.9 F (36.6 C)] 97.9 F (36.6 C) (09/27 0136) Pulse Rate:  [51-80] 59 (09/27 0130) Resp:  [13-22] 19 (09/27 0130) BP: (97-177)/(68-97) 112/72 (09/27 0130) SpO2:  [89 %-100 %] 96 % (09/27 0130) Weight:  [94.1 kg-100.2 kg] 94.1 kg (09/26 2000)   Physical Exam  Constitutional: Appears well-developed and well-nourished.  Psych: Affect appropriate to situation Eyes: No scleral injection HENT:  No OP obstruction MSK: no joint deformities.  Cardiovascular: Normal rate and regular rhythm.  Respiratory: Effort normal, non-labored breathing GI: Soft.  No distension. There is no tenderness.  Skin: WDI  Neuro: Mental Status: Patient is awake and alert, she can communicate readily with head nods/shakes and follow commands readily.  She has no verbal output. Cranial Nerves: II: Visual Fields are full. Pupils are equal, round, and reactive to light.   III,IV, VI: EOMI without ptosis or diploplia.  V: Facial sensation is diminished on the right VII: Facial movement is symmetric.  Motor: She has weakness bilaterally, but more pronounced on the right than the left with 3/5 weakness to confrontation in the right arm and leg.  With distraction, however, there is an inconsistency to her exam. Sensory: Sensation is diminished throughout the right side Cerebellar: Does not perform     I have reviewed labs in epic and the results pertinent to  this consultation are: Creatinine 1.25  I have reviewed the images obtained: CT head-negative  Impression: 76 year old female with cute onset headache with right-sided weakness and numbness and difficulty speaking.  There are multiple inconsistencies on exam concerning for nonorganic etiology, however given her multiple risk factors I would favor ruling her out for acute ischemic stroke.  Also, given the headache followed by neurological symptoms I would favor getting an MRV as well.  It is difficult to rule out the possibility of complicated migraine with some embellishment and therefore I would favor treating it as such if imaging is negative.  Recommendations: 1) MRI brain, MRA head and neck, MRV head 2) if imaging is negative, would treat as complicated migraine.   Roland Rack, MD [facility] [phone]  If 7pm- 7am, please page neurology on call as listed in [place].

## 2022-01-24 NOTE — ED Notes (Signed)
Pt speech has returned. Pt Aox4. Pt states that pain is better. Pt is still weak but improving. Right side is still weaker than left.

## 2022-01-24 NOTE — Discharge Instructions (Signed)

## 2022-01-24 NOTE — ED Notes (Signed)
Patient verbalizes understanding of discharge instructions. Opportunity for questioning and answers were provided. Armband removed by staff, pt discharged from ED via wheelchair.  

## 2022-01-24 NOTE — ED Provider Notes (Signed)
I assumed care in signout. Plan was to reassess patient after medications. Patient reports feeling much improved.  She is awake and alert and has fluent speech Patient is able to ambulate. Patient is requesting discharge home.  We discussed MRI findings which were negative Patient will be referred to neurology if she has any return of her headaches.   Ripley Fraise, MD 01/24/22 [phone]

## 2022-01-30 ENCOUNTER — Other Ambulatory Visit (HOSPITAL_COMMUNITY): Payer: Self-pay

## 2022-02-08 ENCOUNTER — Other Ambulatory Visit (HOSPITAL_COMMUNITY): Payer: Self-pay

## 2022-02-12 ENCOUNTER — Other Ambulatory Visit (HOSPITAL_COMMUNITY): Payer: Self-pay

## 2022-02-13 ENCOUNTER — Other Ambulatory Visit (HOSPITAL_COMMUNITY): Payer: Self-pay

## 2022-02-26 ENCOUNTER — Encounter: Payer: Self-pay | Admitting: Neurology

## 2022-02-26 ENCOUNTER — Ambulatory Visit: Payer: Medicare Other | Admitting: Neurology

## 2022-02-26 VITALS — BP 126/76 | HR 66 | Ht 63.0 in | Wt 198.4 lb

## 2022-02-26 DIAGNOSIS — G43909 Migraine, unspecified, not intractable, without status migrainosus: Secondary | ICD-10-CM

## 2022-02-26 DIAGNOSIS — R2990 Unspecified symptoms and signs involving the nervous system: Secondary | ICD-10-CM | POA: Diagnosis not present

## 2022-02-26 NOTE — Patient Instructions (Signed)
Stroke Prevention Some medical conditions and behaviors can lead to a higher chance of having a stroke. You can help prevent a stroke by eating healthy, exercising, not smoking, and managing any medical conditions you have. Stroke is a leading cause of functional impairment. Primary prevention is particularly important because a majority of strokes are first-time events. Stroke changes the lives of not only those who experience a stroke but also their family and other caregivers. How can this condition affect me? A stroke is a medical emergency and should be treated right away. A stroke can lead to brain damage and can sometimes be life-threatening. If a person gets medical treatment right away, there is a better chance of surviving and recovering from a stroke. What can increase my risk? The following medical conditions may increase your risk of a stroke: Cardiovascular disease. High blood pressure (hypertension). Diabetes. High cholesterol. Sickle cell disease. Blood clotting disorders (hypercoagulable state). Obesity. Sleep disorders (obstructive sleep apnea). Other risk factors include: Being older than age 60. Having a history of blood clots, stroke, or mini-stroke (transient ischemic attack, TIA). Genetic factors, such as race, ethnicity, or a family history of stroke. Smoking cigarettes or using other tobacco products. Taking birth control pills, especially if you also use tobacco. Heavy use of alcohol or drugs, especially cocaine and methamphetamine. Physical inactivity. What actions can I take to prevent this? Manage your health conditions High cholesterol levels. Eating a healthy diet is important for preventing high cholesterol. If cholesterol cannot be managed through diet alone, you may need to take medicines. Take any prescribed medicines to control your cholesterol as told by your health care provider. Hypertension. To reduce your risk of stroke, try to keep your blood  pressure below 130/80. Eating a healthy diet and exercising regularly are important for controlling blood pressure. If these steps are not enough to manage your blood pressure, you may need to take medicines. Take any prescribed medicines to control hypertension as told by your health care provider. Ask your health care provider if you should monitor your blood pressure at home. Have your blood pressure checked every year, even if your blood pressure is normal. Blood pressure increases with age and some medical conditions. Diabetes. Eating a healthy diet and exercising regularly are important parts of managing your blood sugar (glucose). If your blood sugar cannot be managed through diet and exercise, you may need to take medicines. Take any prescribed medicines to control your diabetes as told by your health care provider. Get evaluated for obstructive sleep apnea. Talk to your health care provider about getting a sleep evaluation if you snore a lot or have excessive sleepiness. Make sure that any other medical conditions you have, such as atrial fibrillation or atherosclerosis, are managed. Nutrition Follow instructions from your health care provider about what to eat or drink to help manage your health condition. These instructions may include: Reducing your daily calorie intake. Limiting how much salt (sodium) you use to 1,500 milligrams (mg) each day. Using only healthy fats for cooking, such as olive oil, canola oil, or sunflower oil. Eating healthy foods. You can do this by: Choosing foods that are high in fiber, such as whole grains, and fresh fruits and vegetables. Eating at least 5 servings of fruits and vegetables a day. Try to fill one-half of your plate with fruits and vegetables at each meal. Choosing lean protein foods, such as lean cuts of meat, poultry without skin, fish, tofu, beans, and nuts. Eating low-fat dairy products. Avoiding   foods that are high in sodium. This can help  lower blood pressure. Avoiding foods that have saturated fat, trans fat, and cholesterol. This can help prevent high cholesterol. Avoiding processed and prepared foods. Counting your daily carbohydrate intake.  Lifestyle If you drink alcohol: Limit how much you have to: 0-1 drink a day for women who are not pregnant. 0-2 drinks a day for men. Know how much alcohol is in your drink. In the [place] one drink equals one 12 oz bottle of beer (355mL), one 5 oz glass of wine (148mL), or one 1 oz glass of hard liquor (44mL). Do not use any products that contain nicotine or tobacco. These products include cigarettes, chewing tobacco, and vaping devices, such as e-cigarettes. If you need help quitting, ask your health care provider. Avoid secondhand smoke. Do not use drugs. Activity  Try to stay at a healthy weight. Get at least 30 minutes of exercise on most days, such as: Fast walking. Biking. Swimming. Medicines Take over-the-counter and prescription medicines only as told by your health care provider. Aspirin or blood thinners (antiplatelets or anticoagulants) may be recommended to reduce your risk of forming blood clots that can lead to stroke. Avoid taking birth control pills. Talk to your health care provider about the risks of taking birth control pills if: You are over 35 years old. You smoke. You get very bad headaches. You have had a blood clot. Where to find more information American Stroke Association: www.strokeassociation.org Get help right away if: You or a loved one has any symptoms of a stroke. "BE FAST" is an easy way to remember the main warning signs of a stroke: B - Balance. Signs are dizziness, sudden trouble walking, or loss of balance. E - Eyes. Signs are trouble seeing or a sudden change in vision. F - Face. Signs are sudden weakness or numbness of the face, or the face or eyelid drooping on one side. A - Arms. Signs are weakness or numbness in an arm. This happens  suddenly and usually on one side of the body. S - Speech. Signs are sudden trouble speaking, slurred speech, or trouble understanding what people say. T - Time. Time to call emergency services. Write down what time symptoms started. You or a loved one has other signs of a stroke, such as: A sudden, severe headache with no known cause. Nausea or vomiting. Seizure. These symptoms may represent a serious problem that is an emergency. Do not wait to see if the symptoms will go away. Get medical help right away. Call your local emergency services (911 in the [place] Do not drive yourself to the hospital. Summary You can help to prevent a stroke by eating healthy, exercising, not smoking, limiting alcohol intake, and managing any medical conditions you may have. Do not use any products that contain nicotine or tobacco. These include cigarettes, chewing tobacco, and vaping devices, such as e-cigarettes. If you need help quitting, ask your health care provider. Remember "BE FAST" for warning signs of a stroke. Get help right away if you or a loved one has any of these signs. This information is not intended to replace advice given to you by your health care provider. Make sure you discuss any questions you have with your health care provider. Document Revised: 11/16/2019 Document Reviewed: 11/16/2019 Elsevier Patient Education  2023 [employer]  

## 2022-02-26 NOTE — Progress Notes (Signed)
[id number]    Provider:  Dr [name] Eagles Requesting Provider: Donnajean Lopes, MD Primary Care Provider:  Donnajean Lopes, MD  CC:  migraine. Stroke-ike event. Severe vomiting/diarrhea  HPI:  [name] is a 76 y.o. female here as requested by Donnajean Lopes, MD for migraines. PMHx degenerative lumbar spine stenosis, obesity, osteoarthritis, chronic kidney disease, hypothyroidism, anxiety, anemia, Wolff-Parkinson-White, diabetes. She had votiming and diarrhea and a bad hedache after 3 vaccinations, in the exam her neuro exam was not consistent( reviwed notes from ED per Dr. Alverda Skeans: She has weakness bilaterally, but more pronounced on the right than the left with 3/5 weakness to confrontation in the right arm and leg.  With distraction, however, there is an inconsistency to her exam.")  Patient here alone today: In the same day she had covid, flu, and rsv injections. Patient developed a migraine after all of these shots. She reports she felt unwell that day and the next day she had diarrhea and was throwing up, she had a terrible headache, review of ED notes showed MRI/MRA/MRV was negative and a migraine cocktail resolved. Her BP was "out of the roof". Used to have migraines but that has significantly improved. She was vomiting terribly and diarrhea.While in the ED she had a sudden inability to speak or move she was paralyzed, the left eye was open, right eye was shut, blood pressure and headache severe, a code stroke was called, everything ended up being normal, they ave her fluids, migraine cocktain and symptoms resolved and 2 hours later she could speak and she felt much better. Symptoms have resolved. No other focal neurologic deficits, associated symptoms, inciting events or modifiable factors.  Reviewed notes, labs and imaging from outside physicians, which showed:  From a thorough review of records, medications tried that can be used in headache  management include: Tylenol, atenolol, Decadron, gabapentin, ketorolac, Robaxin, Zofran, Phenergan, scopolamine patch, telmisartan, tramadol, amitriptyline and nortriptyline contraindicated due to age, triptans contraindicated due to possible TIA.  01/23/2022:  FINDINGS: MRI HEAD FINDINGS   Brain: No restricted diffusion to suggest acute or subacute infarct. No acute hemorrhage, mass, mass effect, or midline shift. No hydrocephalus or extra-axial collection. No hemosiderin deposition to suggest remote hemorrhage. Scattered T2 hyperintense signal in the periventricular white matter and pons, likely the sequela of mild-to-moderate chronic small vessel ischemic disease, which has progressed compared to 2010, including a focus in the right basal ganglia that correlates with the hypodensity noted on the same-day CT.   Redemonstrated CSF density lesion within the sella turcica, which flattens the pituitary consistent with arachnoid cyst. The pituitary is slightly less compress than on the 2010 exam.   Vascular: Please see MRA findings below.   Skull and upper cervical spine: Normal marrow signal.   Sinuses/Orbits: No acute finding. Status post bilateral lens replacements.   Other: Fluid in right mastoid air cells.   MRA HEAD FINDINGS   Anterior circulation: Both internal carotid arteries are patent to the termini, without significant stenosis.   A1 segments patent. Normal anterior communicating artery. Anterior cerebral arteries are patent to their distal aspects.   No M1 stenosis or occlusion. Normal MCA bifurcations. Distal MCA branches perfused and symmetric.   Posterior circulation: Vertebral arteries patent to the vertebrobasilar junction without stenosis. Basilar patent to its distal aspect. Superior cerebellar arteries patent bilaterally.   Patent P1 segments. PCAs perfused to their distal aspects without stenosis. Suspect a diminutive right posterior communicating  artery.   Anatomic variants: None  significant   MRV HEAD FINDINGS   There is no evidence of dural venous sinus or deep cerebral vein thrombosis. Narrowing of the distal transverse sinuses near the transverse-sigmoid junction (series 9, image 94).   MRA NECK FINDINGS   Aortic arch: Limited visualization.   Right carotid system: No evidence of dissection, occlusion, or hemodynamically significant stenosis (greater than 50%).   Left carotid system: No evidence of dissection, occlusion, or hemodynamically significant stenosis (greater than 50%).   Vertebral arteries: No evidence of dissection, occlusion, or hemodynamically significant stenosis (greater than 50%).   Other: None.   IMPRESSION: 1. No acute intracranial process. No evidence of acute or subacute infarct. 2. No evidence of venous sinus thrombosis. Narrowing of the distal transverse sinuses near the transverse sigmoid junction which is nonspecific but can be seen in the setting of idiopathic intracranial hypertension. Correlate with symptoms and consider a lumbar puncture with opening pressure if clinically indicated. 3. 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 4.  No intracranial large vessel occlusion or significant stenosis. 5.  No hemodynamically significant stenosis in the neck.     Electronically Signed   By: Merilyn Baba M.D.   On: 01/23/2022 22:50      Review of Systems: Patient complains of symptoms per HPI as well as the following symptoms back pain. Pertinent negatives and positives per HPI. All others negative.   Social History   Socioeconomic History   Marital status: Single    Spouse name: Not on file   Number of children: Not on file   Years of education: Not on file   Highest education level: Not on file  Occupational History   Not on file  Tobacco Use   Smoking status: Former     Packs/day: 0.50    Years: 15.00    Total pack years: 7.50    Types: Cigarettes    Quit date: 61    Years since quitting: 45.8   Smokeless tobacco: Never  Vaping Use   Vaping Use: Never used  Substance and Sexual Activity   Alcohol use: No   Drug use: No   Sexual activity: Not on file    Comment: Hysterectomy  Other Topics Concern   Not on file  Social History Narrative   Not on file   Social Determinants of Health   Financial Resource Strain: Not on file  Food Insecurity: Not on file  Transportation Needs: Not on file  Physical Activity: Not on file  Stress: Not on file  Social Connections: Not on file  Intimate Partner Violence: Not on file    Family History  Problem Relation Age of Onset   Diabetes Mother    Stroke Mother    Heart attack Father    Hypertension Other    Arthritis Other    Breast cancer Neg Hx    Colon cancer Neg Hx    Esophageal cancer Neg Hx    Rectal cancer Neg Hx    Stomach cancer Neg Hx    Migraines Neg Hx     Past Medical History:  Diagnosis Date   Acute blood loss as cause of postoperative anemia 04/03/2016   Anemia    hx of   Anxiety    takes Xanax daily as needed   Arthritis    Bronchitis    a month ago   Chronic back pain    stenosis   Chronic kidney disease  stage 3-sees Dr.J Posey Pronto   Class 3 obesity due to excess calories with body mass index (BMI) of 40.0 to 44.9 in adult    Diabetes mellitus    type II; takes Byetta and Invokana daily   Glaucoma    uses Eye Drops   Gout    takes Uloric daily   History of blood transfusion    no abnormal reaction    History of colon polyps    Hyperlipidemia    no meds taken now   Hypertension    takes HCTZ daily as well as Aldactone   Hypothyroidism    takes Synthroid daily   Pneumonia    many yrs ago   PONV (postoperative nausea and vomiting)    Primary localized osteoarthritis of right knee    Stress incontinence    Syncope    Weakness    numbness and tingling in right  hip/eg/   Wears glasses    Wolf-Parkinson-White syndrome    takes Atenolol daily    Patient Active Problem List   Diagnosis Date Noted   Migraine without status migrainosus, not intractable 02/26/2022   Stroke-like symptom In the setting of illness (vomiting, diarrhea, 3 vaccinations) 02/26/2022   Degenerative lumbar spinal stenosis 08/16/2020   Acute blood loss as cause of postoperative anemia 04/03/2016   Class 3 obesity due to excess calories with body mass index (BMI) of 40.0 to 44.9 in adult    Primary localized osteoarthritis of right knee    CKD (chronic kidney disease), stage III ([place]) 10/22/2015   Syncope 10/21/2015   Non-insulin dependent type 2 diabetes mellitus ([place]) 10/21/2015   Hypothyroidism 10/21/2015   Lumbar stenosis with neurogenic claudication 08/16/2014   Right rotator cuff tear    Anxiety    PONV (postoperative nausea and vomiting)    GERD (gastroesophageal reflux disease)    Arthritis    Thyroid disease    DIVERTICULOSIS-COLON 07/04/2009   Iron deficiency anemia 03/17/2009   PERSONAL HX COLONIC POLYPS 03/17/2009   SKIN RASH 02/23/2009   DIARRHEA 02/23/2009   FEVER, HX OF 02/23/2009   THYROID NODULE, LEFT 02/11/2009   Glaucoma 02/11/2009   Coronary atherosclerosis 02/11/2009   WOLFF (WOLFE)-PARKINSON-WHITE (WPW) SYNDROME 02/11/2009   DEGENERATIVE JOINT DISEASE, ANKLE 02/11/2009   Osteoarthritis of spine 02/11/2009   Essential hypertension 02/09/2009    Past Surgical History:  Procedure Laterality Date   ABDOMINAL HYSTERECTOMY     APPENDECTOMY     BACK SURGERY  2006   lumb fusion   BACK SURGERY  08/16/2014   L3-4 fusion   BREAST BIOPSY     BREAST ENHANCEMENT SURGERY Bilateral    BREAST EXCISIONAL BIOPSY Left    benign   CARDIAC CATHETERIZATION  >70yr ago   CARPAL TUNNEL RELEASE Bilateral    x4   cataract surgery Bilateral    CESAREAN SECTION     x 3   CHOLECYSTECTOMY  1981   COLONOSCOPY     KNEE ARTHROSCOPY Right 2009   rt   RADIOLOGY  WITH ANESTHESIA N/A 05/12/2020   Procedure: MRI LUMBAR SPINE WITH AND WITHOUT CONTRAST;  Surgeon: Radiologist, Medication, MD;  Location: [facility]  Service: Radiology;  Laterality: N/A;   RAZ procedure  10+yrs ago   REDUCTION MAMMAPLASTY Bilateral 2006   ROTATOR CUFF REPAIR Right    x 4   SHOULDER ARTHROSCOPY WITH ROTATOR CUFF REPAIR AND SUBACROMIAL DECOMPRESSION Right 06/10/2012   Procedure: SHOULDER ARTHROSCOPY WITH ROTATOR CUFF REPAIR AND SUBACROMIAL DECOMPRESSION;  Surgeon: RAudree Camel  Noemi Chapel, MD;  Location: [facility];  Service: Orthopedics;  Laterality: Right;  RIGHT SHOULDER ARTHROSCOPY, DECOMPRESSION SUBACROMIAL PARTIAL ACROMIOPLASTY WITH CORACOACROMIAL RELEASE,, DISTAL CLAVICULECTOMY , ROTATOR CUFF debriedment   SKIN BIOPSY     removed moles all over body   THYROID SURGERY Right    right thyroid removed   TOTAL KNEE ARTHROPLASTY Right 04/02/2016   Procedure: RIGHT TOTAL KNEE ARTHROPLASTY;  Surgeon: Elsie Saas, MD;  Location: [facility];  Service: Orthopedics;  Laterality: Right;   TRANSFORAMINAL LUMBAR INTERBODY FUSION (TLIF) WITH PEDICLE SCREW FIXATION 1 LEVEL Left 08/16/2020   Procedure: Left Lumbar two-three Transforaminal lumbar interbody fusion with exploration and extension of adjacent level fusion;  Surgeon: Erline Levine, MD;  Location: [facility];  Service: Neurosurgery;  Laterality: Left;    Current Outpatient Medications  Medication Sig Dispense Refill   ALPRAZolam (XANAX) 0.5 MG tablet Take 0.5 mg by mouth 3 (three) times daily as needed for anxiety.      aspirin 81 MG chewable tablet Chew 81 mg by mouth daily.     atenolol (TENORMIN) 25 MG tablet Take 25 mg by mouth daily.     ciclopirox (PENLAC) 8 % solution Apply topically at bedtime. Apply over nail and surrounding skin. Apply daily over previous coat. After seven  days, remove with alcohol and continue cycle. 6.6 mL 2   COVID-19 mRNA bivalent vaccine, Pfizer, (PFIZER COVID-19 VAC BIVALENT) injection Inject into the  muscle. 0.3 mL 0   dicyclomine (BENTYL) 10 MG capsule Take 1 capsule (10 mg total) by mouth 3 (three) times daily before meals. 90 capsule 5   febuxostat (ULORIC) 40 MG tablet Take 40 mg by mouth daily.      furosemide (LASIX) 20 MG tablet Take 20 mg by mouth daily.     HYDROmorphone (DILAUDID) 2 MG tablet Take 0.5-1 tablets (1-2 mg total) by mouth every 4 (four) hours as needed for severe pain. 30 tablet 0   latanoprost (XALATAN) 0.005 % ophthalmic solution Place 1 drop into both eyes at bedtime.     levothyroxine (SYNTHROID, LEVOTHROID) 50 MCG tablet Take 50-100 mcg by mouth See admin instructions. Take 50 mcg daily, except 100 mcg by mouth on Sunday     methocarbamol (ROBAXIN) 500 MG tablet Take 1 tablet (500 mg total) by mouth every 6 (six) hours as needed for muscle spasms. 90 tablet 0   Multiple Vitamins-Minerals (MULTIVITAMIN WITH MINERALS) tablet Take 1 tablet by mouth daily.     pravastatin (PRAVACHOL) 20 MG tablet Take 20 mg by mouth every evening.     telmisartan (MICARDIS) 40 MG tablet Take 40 mg by mouth daily.     tirzepatide (MOUNJARO) 12.5 MG/0.5ML Pen Inject 1 pen as directed into the skin once weekly 2 mL 2   tirzepatide (MOUNJARO) 12.5 MG/0.5ML Pen Inject 12.5 mg under the skin once a week 2 mL 3   tirzepatide (MOUNJARO) 12.5 MG/0.5ML Pen Inject 12.5 mg into the skin once a week 6 mL 1   Current Facility-Administered Medications  Medication Dose Route Frequency Provider Last Rate Last Admin   0.9 %  sodium chloride infusion  500 mL Intravenous Once Ladene Artist, MD        Allergies as of 02/26/2022 - Review Complete 02/26/2022  Allergen Reaction Noted   Contrast media [iodinated contrast media] Shortness Of Breath and Other (See Comments) 06/03/2012   Triptans Other (See Comments) 02/26/2022   Adhesive [tape] Other (See Comments) 08/09/2014   Esomeprazole magnesium Hives  Morphine and related Other (See Comments) 03/16/2016   Talwin [pentazocine] Other (See  Comments) 03/23/2016   Acetaminophen  08/01/2020   Ceftriaxone  06/29/2021   Allopurinol Diarrhea    Codeine Nausea And Vomiting    Colchicine Diarrhea    Hydrocodone Nausea And Vomiting    Meperidine hcl Nausea And Vomiting    Oxycodone Nausea And Vomiting 06/03/2012   Pentazocine lactate Nausea And Vomiting     Vitals: BP 126/76   Pulse 66   Ht '5\' 3"'$  (1.6 m)   Wt 198 lb 6.4 oz (90 kg)   LMP  (LMP Unknown)   BMI 35.14 kg/m  Last Weight:  Wt Readings from Last 1 Encounters:  02/26/22 198 lb 6.4 oz (90 kg)   Last Height:   Ht Readings from Last 1 Encounters:  02/26/22 '5\' 3"'$  (1.6 m)     Physical exam: Exam: Gen: NAD, conversant, well nourised, obese, well groomed                     CV: RRR, no MRG. No Carotid Bruits. No peripheral edema, warm, nontender Eyes: Conjunctivae clear without exudates or hemorrhage  Neuro: Detailed Neurologic Exam  Speech:    Speech is normal; fluent and spontaneous with normal comprehension.  Cognition:    The patient is oriented to person, place, and time;     recent and remote memory intact;     language fluent;     normal attention, concentration,     fund of knowledge Cranial Nerves:    The pupils are equal, round, and reactive to light. Pupils too small to visualize fuundi, attempted.  Visual fields are full to finger confrontation. Extraocular movements are intact. Trigeminal sensation is intact and the muscles of mastication are normal. The face is symmetric. The palate elevates in the midline. Hearing intact. Voice is normal. Shoulder shrug is normal. The tongue has normal motion without fasciculations.   Coordination:    Normal finger to nose and heel to shin.   Gait:    Heel-toe and tandem gait are normal (slight imbalance on tandem but overall good balance), antalgic back pain   Motor Observation:    No asymmetry, no atrophy, and no involuntary movements noted. Tone:    Normal muscle tone.    Posture:    Posture is  normal. normal erect    Strength:    Strength is V/V in the upper and lower limbs. Left leg giveway (chronic since back surgery) but has full strength momentarily so I believe this is intact strength due to pain.      Sensation: intact to LT     Reflex Exam:  DTR's:    Slightly reduced riht AJ but otherwise Deep tendon reflexes in the upper and lower extremities are normal and symmetrical bilaterally possibly slightly brisk uppers but within normal limits.  Toes:    The toes are downgoing bilaterally.   Clonus:    Clonus is absent.    Assessment/Plan:  76 y.o. female here as requested by Donnajean Lopes, MD for migraines. PMHx degenerative lumbar spine stenosis, obesity, osteoarthritis, chronic kidney disease, hypothyroidism, anxiety, anemia, Wolff-Parkinson-White, diabetes. She had votiming and diarrhea and a bad hedache after 3 vaccinations, in the exam her neuro exam was not consistent(per Dr. Alverda Skeans: She has weakness bilaterally, but more pronounced on the right than the left with 3/5 weakness to confrontation in the right arm and leg.  With distraction, however, there is an inconsistency to her  exam."). Today exam is non focal.   Patient had multiple vaccinations, vomiting and diarrhea, headache, MRI/MRA/MRV negative, felt better with fluids and migraine cocktail, exam is normal today, suspect migraine/headache in the setting of severe illness and NOT a TIA or STROKE. She rarely get any migraines if every anyone so no need for new medication, a tylenol helps if needed. RTC as needed  Doubtful was TIA but continue to take ASA daily, watch cholesterol, BP and other vascular risk factors. Would not recommend triptan in the future but her migraines have resolved anyway.   She has low back paina nd rods in the back but this chronic and strength appears intact except for some giveway I think she has no strength issues more her back. Uses a cane (not today)  She had an episode  of inability to talk while dehydrated and sick, resolved, but was aware and could hear everything, very unlikely seizure do not need EEG never happened befpre  No cardiac symptoms, will defer echo to Drs. Tamala Julian and D[place] sent him an email   I had a long d/w patient about her recent stroke-like episode, risk for stroke/TIAs, personally independently reviewed imaging studies and stroke evaluation results and answered questions.Continue ASA for secondary stroke prevention and maintain strict control of hypertension with blood pressure goal below 130/90, diabetes with hemoglobin A1c goal below 6.5% and lipids with LDL < 70 cholesterol goal below 70 mg/dL. I also advised the patient to eat a healthy diet with plenty of whole grains, lean meats, fruits and vegetables, exercise regularly and maintain ideal body weight .Followup in the future with me in  2 months or call earlier if necessary.  Orders Placed This Encounter  Procedures   Lipid Panel      Cc: Donnajean Lopes, MD,  Donnajean Lopes, MD, Dr. Daneen Schick  Sarina Ill, MD  [facility] [address] [place], [place] [address]  Phone [phone] Fax [phone]

## 2022-02-27 LAB — LIPID PANEL
Chol/HDL Ratio: 2.5 ratio (ref 0.0–4.4)
Cholesterol, Total: 175 mg/dL (ref 100–199)
HDL: 71 mg/dL (ref >39)
LDL Chol Calc (NIH): 89 mg/dL (ref 0–99)
Triglycerides: 79 mg/dL (ref 0–149)
VLDL Cholesterol Cal: 15 mg/dL (ref 5–40)

## 2022-02-28 ENCOUNTER — Telehealth: Payer: Self-pay | Admitting: *Deleted

## 2022-02-28 NOTE — Telephone Encounter (Signed)
I called pt and relayed to her the lipid panel/ cholesterol was fine (all normal).  She appreciated call back and was very appreciative of Dr. [name] Eagles (thoroughness) and will put in positive note.

## 2022-02-28 NOTE — Telephone Encounter (Signed)
-----   Message from Melvenia Beam, MD sent at 02/27/2022  4:20 PM EDT ----- Cholesterol looks fine thanks

## 2022-03-07 ENCOUNTER — Other Ambulatory Visit: Payer: Self-pay | Admitting: Internal Medicine

## 2022-03-07 DIAGNOSIS — Z1231 Encounter for screening mammogram for malignant neoplasm of breast: Secondary | ICD-10-CM

## 2022-03-13 ENCOUNTER — Other Ambulatory Visit (HOSPITAL_COMMUNITY): Payer: Self-pay

## 2022-03-15 DIAGNOSIS — H6121 Impacted cerumen, right ear: Secondary | ICD-10-CM | POA: Insufficient documentation

## 2022-03-15 DIAGNOSIS — H93A1 Pulsatile tinnitus, right ear: Secondary | ICD-10-CM | POA: Insufficient documentation

## 2022-03-29 ENCOUNTER — Other Ambulatory Visit: Payer: Self-pay | Admitting: Nurse Practitioner

## 2022-04-09 ENCOUNTER — Other Ambulatory Visit (HOSPITAL_COMMUNITY): Payer: Self-pay

## 2022-05-01 ENCOUNTER — Other Ambulatory Visit (HOSPITAL_COMMUNITY): Payer: Self-pay

## 2022-05-01 MED ORDER — MOUNJARO 12.5 MG/0.5ML ~~LOC~~ SOAJ
12.5000 mg | SUBCUTANEOUS | 5 refills | Status: DC
  Filled 2022-05-01: qty 2, 28d supply, fill #0
  Filled 2022-06-06: qty 2, 28d supply, fill #1
  Filled 2022-12-21: qty 2, 28d supply, fill #2

## 2022-05-04 ENCOUNTER — Other Ambulatory Visit (HOSPITAL_COMMUNITY): Payer: Self-pay

## 2022-05-07 ENCOUNTER — Other Ambulatory Visit (HOSPITAL_COMMUNITY): Payer: Self-pay

## 2022-05-07 ENCOUNTER — Encounter: Payer: Self-pay | Admitting: *Deleted

## 2022-05-08 ENCOUNTER — Other Ambulatory Visit (HOSPITAL_COMMUNITY): Payer: Self-pay

## 2022-05-11 ENCOUNTER — Encounter: Payer: Self-pay | Admitting: Nurse Practitioner

## 2022-05-11 ENCOUNTER — Ambulatory Visit: Payer: Medicare Other | Admitting: Nurse Practitioner

## 2022-05-11 VITALS — BP 114/80 | HR 68 | Ht 63.0 in | Wt 188.5 lb

## 2022-05-11 DIAGNOSIS — K589 Irritable bowel syndrome without diarrhea: Secondary | ICD-10-CM

## 2022-05-11 NOTE — Patient Instructions (Addendum)
Benefiber 1 tablespoon once daily as tolerated to regulate your bowel movements  Take Miralax 1 capful mixed in 8 ounces of water at bed time for constipation as tolerated  Stop Dicyclomine if you develop any dizziness, lightheadedness or balance problems   Follow up as needed    If you are age 76 or older, your body mass index should be between 23-30. Your Body mass index is 33.39 kg/m. If this is out of the aforementioned range listed, please consider follow up with your Primary Care Provider.  If you are age 61 or younger, your body mass index should be between 19-25. Your Body mass index is 33.39 kg/m. If this is out of the aformentioned range listed, please consider follow up with your Primary Care Provider.   The [facility] providers would like to encourage you to use [facility] to communicate with providers for non-urgent requests or questions.  Due to long hold times on the telephone, sending your provider a message by [facility] may be a faster and more efficient way to get a response.  Please allow 48 business hours for a response.  Please remember that this is for non-urgent requests.   It was a pleasure to see you today!  Thank you for trusting me with your gastrointestinal care!

## 2022-05-11 NOTE — Progress Notes (Signed)
05/11/2022 [name] [id number] February 07, 1946   Chief Complaint: IBS follow up  History of Present Illness: [name] is a 77 year old female with a past medical history of anxiety, hypertension, hypercholesterolemia, WPW syndrome, DM type II, CKD stage III, chronic anemia, hypothyroidism, spinal stenosis, diverticulosis and colon polyps. Past cholecystectomy, hysterectomy, partial thyroidectomy, back and knee surgery. She is known by Dr. Fuller Plan.   She presents today for IBS follow-up. She might go a few days without passing a BM. The following day she will pass a normal stool. If no BM in 3 days she will take Miralax. She takes Bentyl 2 to 3 times daily before meals which controls her IBS-D symptoms.  She denies experiencing any lightheadedness at this, dizziness or balance issues after taking Bentyl. She has diarrhea if she eats greasy/fatty foods. She "doubles up" on Bentyl if she goes out to eat.  She sometimes has constipation.  No blood per the rectum. No black stools. She reported losing 62 lbs over the past 2 years by reducing the carbohydrate intake in her diet. Her most recent colonoscopy was 12/15/2019 which identified 1 tubular adenomatous polyp which was removed from the ascending colon and random colon biopsies were negative for microscopic colitis or IBD. No other complaints at this time.      Latest Ref Rng & Units 01/23/2022    8:55 PM 01/23/2022    8:40 PM 01/23/2022    8:10 PM  CBC  WBC 4.0 - 10.5 K/uL  6.3  6.5   Hemoglobin 12.0 - 15.0 g/dL 11.9  11.2  10.6   Hematocrit 36.0 - 46.0 % 35.0  34.6  33.7   Platelets 150 - 400 K/uL  242  269        Latest Ref Rng & Units 01/23/2022    8:55 PM 01/23/2022    8:40 PM 01/23/2022    8:10 PM  CMP  Glucose 70 - 99 mg/dL 105  107  112   BUN 8 - 23 mg/dL '31  25  23   '$ Creatinine 0.44 - 1.00 mg/dL 1.20  1.25  1.16   Sodium 135 - 145 mmol/L 136  140  140   Potassium 3.5 - 5.1 mmol/L 3.9  3.7  3.7   Chloride 98 - 111 mmol/L  109  104  105   CO2 22 - 32 mmol/L  23  26   Calcium 8.9 - 10.3 mg/dL  9.7  9.6   Total Protein 6.5 - 8.1 g/dL  7.4    Total Bilirubin 0.3 - 1.2 mg/dL  0.4    Alkaline Phos 38 - 126 U/L  96    AST 15 - 41 U/L  15    ALT 0 - 44 U/L  12      Colonoscopy 12/15/2019: - Ileal diverticula, otherwise normal appearing. - One 7 mm polyp in the ascending colon, removed with a cold snare. Resected and retrieved. - Mild diverticulosis in the right colon. - Moderate diverticulosis in the left colon. - The exam was otherwise without abnormality on direct and retroflexion views. Random biopsies obtained 1. Surgical [P], colon, random sites - COLONIC MUCOSA WITH NO SIGNIFICANT HISTOPATHOLOGIC CHANGES. - NO MICROSCOPIC COLITIS, ACTIVE INFLAMMATION OR CHRONIC CHANGES. 2. Surgical [P], colon, ascending, polyp - TUBULAR ADENOMA(S). - NO HIGH GRADE DYSPLASIA OR CARCINOMA  Past Medical History:  Diagnosis Date   Acute blood loss as cause of postoperative anemia 04/03/2016   Adenomatous colon polyp  Anemia    hx of   Anxiety    takes Xanax daily as needed   Arthritis    Bronchitis    a month ago   Chronic back pain    stenosis   Chronic kidney disease    stage 3-sees Dr.J Patel   Class 3 obesity due to excess calories with body mass index (BMI) of 40.0 to 44.9 in adult    Diabetes mellitus    type II; takes Byetta and Invokana daily   Diverticulosis    Glaucoma    uses Eye Drops   Gout    takes Uloric daily   Hepatic steatosis    History of blood transfusion    no abnormal reaction    History of colon polyps    Hyperlipidemia    no meds taken now   Hypertension    takes HCTZ daily as well as Aldactone   Hypothyroidism    takes Synthroid daily   Pneumonia    many yrs ago   PONV (postoperative nausea and vomiting)    Primary localized osteoarthritis of right knee    Stress incontinence    Syncope    Weakness    numbness and tingling in right hip/eg/   Wears glasses     Wolf-Parkinson-White syndrome    takes Atenolol daily   Past Surgical History:  Procedure Laterality Date   ABDOMINAL HYSTERECTOMY     APPENDECTOMY     BACK SURGERY  2006   lumb fusion   BACK SURGERY  08/16/2014   L3-4 fusion   BREAST BIOPSY     BREAST ENHANCEMENT SURGERY Bilateral    BREAST EXCISIONAL BIOPSY Left    benign   CARDIAC CATHETERIZATION  >56yr ago   CARPAL TUNNEL RELEASE Bilateral    x4   cataract surgery Bilateral    CESAREAN SECTION     x 3   CHOLECYSTECTOMY  1981   COLONOSCOPY     KNEE ARTHROSCOPY Right 2009   rt   RADIOLOGY WITH ANESTHESIA N/A 05/12/2020   Procedure: MRI LUMBAR SPINE WITH AND WITHOUT CONTRAST;  Surgeon: Radiologist, Medication, MD;  [place] [facility]  Service: Radiology;  Laterality: N/A;   RAZ procedure  10+yrs ago   REDUCTION MAMMAPLASTY Bilateral 2006   ROTATOR CUFF REPAIR Right    x 4   SHOULDER ARTHROSCOPY WITH ROTATOR CUFF REPAIR AND SUBACROMIAL DECOMPRESSION Right 06/10/2012   Procedure: SHOULDER ARTHROSCOPY WITH ROTATOR CUFF REPAIR AND SUBACROMIAL DECOMPRESSION;  Surgeon: RLorn Junes MD;  [place] [facility]  Service: Orthopedics;  Laterality: Right;  RIGHT SHOULDER ARTHROSCOPY, DECOMPRESSION SUBACROMIAL PARTIAL ACROMIOPLASTY WITH CORACOACROMIAL RELEASE,, DISTAL CLAVICULECTOMY , ROTATOR CUFF debriedment   SKIN BIOPSY     removed moles all over body   THYROID SURGERY Right    right thyroid removed   TOTAL KNEE ARTHROPLASTY Right 04/02/2016   Procedure: RIGHT TOTAL KNEE ARTHROPLASTY;  Surgeon: RElsie Saas MD;  [place] [facility]  Service: Orthopedics;  Laterality: Right;   TRANSFORAMINAL LUMBAR INTERBODY FUSION (TLIF) WITH PEDICLE SCREW FIXATION 1 LEVEL Left 08/16/2020   Procedure: Left Lumbar two-three Transforaminal lumbar interbody fusion with exploration and extension of adjacent level fusion;  Surgeon: SErline Levine MD;  [place] [facility]  Service: Neurosurgery;  Laterality: Left;   Current Medications,  Allergies, Past Medical History, Past Surgical History, Family History and Social History were reviewed in [employer].  Review of Systems:   Constitutional: Negative for fever, sweats, chills or unintentional weight  loss.  Respiratory: Negative for shortness of breath.   Cardiovascular: Negative for chest pain, palpitations and leg swelling.  Gastrointestinal: See HPI.  Musculoskeletal: Negative for back pain or muscle aches.  Neurological: Negative for dizziness, headaches or paresthesias.    Physical Exam: LMP  (LMP Unknown)  BP 114/80   Pulse 68   Ht '5\' 3"'$  (1.6 m)   Wt 188 lb 8 oz (85.5 kg)   LMP  (LMP Unknown)   BMI 33.39 kg/m   General: 77 year old female in no acute distress. Head: Normocephalic and atraumatic. Eyes: No scleral icterus. Conjunctiva pink . Ears: Normal auditory acuity. Mouth: Dentition intact. No ulcers or lesions.  Lungs: Clear throughout to auscultation. Heart: Regular rate and rhythm, no murmur. Abdomen: Soft, nontender and nondistended. No masses or hepatomegaly. Normal bowel sounds x 4 quadrants.  Rectal: Deferred. Musculoskeletal: Symmetrical with no gross deformities. Extremities: No edema. Neurological: Alert oriented x 4. No focal deficits.  Psychological: Alert and cooperative. Normal mood and affect  Assessment and Recommendations: 45) 77 year old female with IBS-D is well-controlled on Dicyclomine 10 mg 3 times daily. -Dicyclomine 10 mg 1 p.o. 3 times daily, patient to stop if she develops dizziness, lightheadedness or balance issues -Benefiber 1 tablespoon daily as tolerated  2) History of a tubular adenomatous polyp per colonoscopy 11/2019.  No family history of colon cancer.  -Dr. Fuller Plan to verify if any further colon polyp surveillance colonoscopies recommended  3) Constipation -Miralax as tolerated   4) Mild chronic normocytic anemia, likely due to CKD. No overt GI bleeding.    5) CKD.

## 2022-05-15 ENCOUNTER — Ambulatory Visit
Admission: RE | Admit: 2022-05-15 | Discharge: 2022-05-15 | Disposition: A | Discharge status: Home / Self Care | Payer: Medicare Other | Source: Ambulatory Visit | Attending: Internal Medicine | Admitting: Internal Medicine

## 2022-05-15 DIAGNOSIS — Z1231 Encounter for screening mammogram for malignant neoplasm of breast: Secondary | ICD-10-CM

## 2022-05-16 ENCOUNTER — Other Ambulatory Visit: Payer: Self-pay | Admitting: Neurosurgery

## 2022-05-16 DIAGNOSIS — H93A9 Pulsatile tinnitus, unspecified ear: Secondary | ICD-10-CM

## 2022-05-18 ENCOUNTER — Other Ambulatory Visit: Payer: Self-pay | Admitting: Neurosurgery

## 2022-06-06 ENCOUNTER — Other Ambulatory Visit (HOSPITAL_COMMUNITY): Payer: Self-pay

## 2022-06-11 ENCOUNTER — Other Ambulatory Visit (HOSPITAL_COMMUNITY): Payer: Self-pay

## 2022-06-11 ENCOUNTER — Other Ambulatory Visit: Payer: Self-pay | Admitting: Internal Medicine

## 2022-06-11 DIAGNOSIS — H93A9 Pulsatile tinnitus, unspecified ear: Secondary | ICD-10-CM

## 2022-06-12 ENCOUNTER — Ambulatory Visit (HOSPITAL_COMMUNITY)
Admission: RE | Admit: 2022-06-12 | Discharge: 2022-06-12 | Disposition: A | Discharge status: Home / Self Care | Payer: Medicare Other | Source: Ambulatory Visit | Attending: Neurosurgery | Admitting: Neurosurgery

## 2022-06-12 ENCOUNTER — Other Ambulatory Visit: Payer: Self-pay | Admitting: Neurosurgery

## 2022-06-12 DIAGNOSIS — I129 Hypertensive chronic kidney disease with stage 1 through stage 4 chronic kidney disease, or unspecified chronic kidney disease: Secondary | ICD-10-CM | POA: Insufficient documentation

## 2022-06-12 DIAGNOSIS — H93A9 Pulsatile tinnitus, unspecified ear: Secondary | ICD-10-CM

## 2022-06-12 DIAGNOSIS — Z7985 Long-term (current) use of injectable non-insulin antidiabetic drugs: Secondary | ICD-10-CM | POA: Insufficient documentation

## 2022-06-12 DIAGNOSIS — E1122 Type 2 diabetes mellitus with diabetic chronic kidney disease: Secondary | ICD-10-CM | POA: Diagnosis not present

## 2022-06-12 DIAGNOSIS — H93A1 Pulsatile tinnitus, right ear: Secondary | ICD-10-CM | POA: Diagnosis present

## 2022-06-12 DIAGNOSIS — N183 Chronic kidney disease, stage 3 unspecified: Secondary | ICD-10-CM | POA: Insufficient documentation

## 2022-06-12 DIAGNOSIS — Z87891 Personal history of nicotine dependence: Secondary | ICD-10-CM | POA: Insufficient documentation

## 2022-06-12 HISTORY — PX: IR ANGIO INTRA EXTRACRAN SEL COM CAROTID INNOMINATE UNI L MOD SED: IMG5358

## 2022-06-12 HISTORY — PX: IR ANGIO EXTERNAL CAROTID SEL EXT CAROTID UNI R MOD SED: IMG5371

## 2022-06-12 HISTORY — PX: IR ANGIO INTRA EXTRACRAN SEL INTERNAL CAROTID UNI R MOD SED: IMG5362

## 2022-06-12 HISTORY — PX: IR ANGIO VERTEBRAL SEL VERTEBRAL BILAT MOD SED: IMG5369

## 2022-06-12 LAB — GLUCOSE, CAPILLARY: Glucose-Capillary: 181 mg/dL — ABNORMAL HIGH (ref 70–99)

## 2022-06-12 LAB — CBC WITH DIFFERENTIAL/PLATELET
Abs Immature Granulocytes: 0.02 10*3/uL (ref 0.00–0.07)
Basophils Absolute: 0 10*3/uL (ref 0.0–0.1)
Basophils Relative: 0 %
Eosinophils Absolute: 0 10*3/uL (ref 0.0–0.5)
Eosinophils Relative: 0 %
HCT: 35.3 % — ABNORMAL LOW (ref 36.0–46.0)
Hemoglobin: 11.8 g/dL — ABNORMAL LOW (ref 12.0–15.0)
Immature Granulocytes: 0 %
Lymphocytes Relative: 15 %
Lymphs Abs: 0.7 10*3/uL (ref 0.7–4.0)
MCH: 28.3 pg (ref 26.0–34.0)
MCHC: 33.4 g/dL (ref 30.0–36.0)
MCV: 84.7 fL (ref 80.0–100.0)
Monocytes Absolute: 0 10*3/uL — ABNORMAL LOW (ref 0.1–1.0)
Monocytes Relative: 0 %
Neutro Abs: 3.9 10*3/uL (ref 1.7–7.7)
Neutrophils Relative %: 85 %
Platelets: 283 10*3/uL (ref 150–400)
RBC: 4.17 MIL/uL (ref 3.87–5.11)
RDW: 13.2 % (ref 11.5–15.5)
WBC: 4.7 10*3/uL (ref 4.0–10.5)
nRBC: 0 % (ref 0.0–0.2)

## 2022-06-12 LAB — BASIC METABOLIC PANEL
Anion gap: 12 (ref 5–15)
BUN: 27 mg/dL — ABNORMAL HIGH (ref 8–23)
CO2: 22 mmol/L (ref 22–32)
Calcium: 10.1 mg/dL (ref 8.9–10.3)
Chloride: 103 mmol/L (ref 98–111)
Creatinine, Ser: 1.34 mg/dL — ABNORMAL HIGH (ref 0.44–1.00)
GFR, Estimated: 41 mL/min — ABNORMAL LOW (ref >60)
Glucose, Bld: 179 mg/dL — ABNORMAL HIGH (ref 70–99)
Potassium: 4.2 mmol/L (ref 3.5–5.1)
Sodium: 137 mmol/L (ref 135–145)

## 2022-06-12 LAB — PROTIME-INR
INR: 1 (ref 0.8–1.2)
Prothrombin Time: 12.9 seconds (ref 11.4–15.2)

## 2022-06-12 MED ORDER — FENTANYL CITRATE (PF) 100 MCG/2ML IJ SOLN
INTRAMUSCULAR | Status: AC
  Filled 2022-06-12: qty 2

## 2022-06-12 MED ORDER — MIDAZOLAM HCL 2 MG/2ML IJ SOLN
INTRAMUSCULAR | Status: AC
  Filled 2022-06-12: qty 2

## 2022-06-12 MED ORDER — DIPHENHYDRAMINE HCL 50 MG/ML IJ SOLN
INTRAMUSCULAR | Status: AC | PRN
  Administered 2022-06-12: 50 mg via INTRAVENOUS

## 2022-06-12 MED ORDER — FENTANYL CITRATE (PF) 100 MCG/2ML IJ SOLN
INTRAMUSCULAR | Status: AC | PRN
  Administered 2022-06-12: 25 ug via INTRAVENOUS

## 2022-06-12 MED ORDER — DIPHENHYDRAMINE HCL 50 MG/ML IJ SOLN
INTRAMUSCULAR | Status: AC
  Filled 2022-06-12: qty 1

## 2022-06-12 MED ORDER — MIDAZOLAM HCL 2 MG/2ML IJ SOLN
INTRAMUSCULAR | Status: AC | PRN
  Administered 2022-06-12: 1 mg via INTRAVENOUS

## 2022-06-12 MED ORDER — SODIUM CHLORIDE 0.9 % IV SOLN: INTRAVENOUS | Status: DC

## 2022-06-12 MED ORDER — IOHEXOL 300 MG/ML  SOLN
150.0000 mL | Freq: Once | INTRAMUSCULAR | Status: AC | PRN
  Administered 2022-06-12: 60 mL via INTRA_ARTERIAL

## 2022-06-12 MED ORDER — LIDOCAINE HCL 1 % IJ SOLN
INTRAMUSCULAR | Status: AC
  Filled 2022-06-12: qty 20

## 2022-06-12 NOTE — H&P (Signed)
Chief Complaint   Pulsatile Tinnitus  History of Present Illness    [name] is a 77 year old woman I am seeing for the first time, previously seen by my partner, Dr. Reatha Armour. She is referred for evaluation of right-sided pulsatile tinnitus. Her symptoms started it seems a few days after she received multiple vaccine including COVID and flu a few months ago. A few days later she apparently had sudden onset of severe headache with right-sided tinnitus and perhaps some hearing loss. She was seen at her primary care doctor&#39;s office where an attempt to irrigate her ear cause severe pain. She was ultimately seen in the emergency department and diagnosed with atypical migraine initially. She did also undergo MRI of the brain at that time. She was thought to perhaps have some stenosis in the right transverse sinus and was therefore referred to me for further evaluation. Patient does complain of the pulsatile tinnitus although she does not notice it as much during the day when she is busy with activities. She does notice it at night when trying to sleep. She feels that she may also be losing some hearing on that right side. She has not complained of associated vertigo or dizziness. No visual changes.  Of note, the patient does report a medical history significant for hypertension and diabetes as well as some chronic kidney disease. She is not on any blood thinners or antiplatelet agents.    Past Medical History   Past Medical History:  Diagnosis Date   Acute blood loss as cause of postoperative anemia 04/03/2016   Adenomatous colon polyp    Anemia    hx of   Anxiety    takes Xanax daily as needed   Arthritis    Bronchitis    a month ago   Chronic back pain    stenosis   Chronic kidney disease    stage 3-sees Dr.J Patel   Class 3 obesity due to excess calories with body mass index (BMI) of 40.0 to 44.9 in adult    Diabetes mellitus    type II; takes Byetta and Invokana daily    Diverticulosis    Glaucoma    uses Eye Drops   Gout    takes Uloric daily   Hepatic steatosis    History of blood transfusion    no abnormal reaction    History of colon polyps    Hyperlipidemia    no meds taken now   Hypertension    takes HCTZ daily as well as Aldactone   Hypothyroidism    takes Synthroid daily   Pneumonia    many yrs ago   PONV (postoperative nausea and vomiting)    Primary localized osteoarthritis of right knee    Stress incontinence    Syncope    Venous stenosis of right upper extremity    Weakness    numbness and tingling in right hip/eg/   Wears glasses    Wolf-Parkinson-White syndrome    takes Atenolol daily    Past Surgical History   Past Surgical History:  Procedure Laterality Date   ABDOMINAL HYSTERECTOMY     APPENDECTOMY     BACK SURGERY  2006   lumb fusion   BACK SURGERY  08/16/2014   L3-4 fusion   BREAST BIOPSY     BREAST ENHANCEMENT SURGERY Bilateral    BREAST EXCISIONAL BIOPSY Left    benign   CARDIAC CATHETERIZATION  >61yr ago   CARPAL TUNNEL RELEASE Bilateral    x4  cataract surgery Bilateral    CESAREAN SECTION     x 3   CHOLECYSTECTOMY  1981   COLONOSCOPY     KNEE ARTHROSCOPY Right 2009   rt   RADIOLOGY WITH ANESTHESIA N/A 05/12/2020   Procedure: MRI LUMBAR SPINE WITH AND WITHOUT CONTRAST;  Surgeon: Radiologist, Medication, MD;  Location: [facility];  Service: Radiology;  Laterality: N/A;   RAZ procedure  10+yrs ago   REDUCTION MAMMAPLASTY Bilateral 2006   ROTATOR CUFF REPAIR Right    x 4   SHOULDER ARTHROSCOPY WITH ROTATOR CUFF REPAIR AND SUBACROMIAL DECOMPRESSION Right 06/10/2012   Procedure: SHOULDER ARTHROSCOPY WITH ROTATOR CUFF REPAIR AND SUBACROMIAL DECOMPRESSION;  Surgeon: Lorn Junes, MD;  Location: [place];  Service: Orthopedics;  Laterality: Right;  RIGHT SHOULDER ARTHROSCOPY, DECOMPRESSION SUBACROMIAL PARTIAL ACROMIOPLASTY WITH CORACOACROMIAL RELEASE,, DISTAL CLAVICULECTOMY , ROTATOR CUFF  debriedment   SKIN BIOPSY     removed moles all over body   THYROID SURGERY Right    right thyroid removed   TOTAL KNEE ARTHROPLASTY Right 04/02/2016   Procedure: RIGHT TOTAL KNEE ARTHROPLASTY;  Surgeon: Elsie Saas, MD;  Location: [place];  Service: Orthopedics;  Laterality: Right;   TRANSFORAMINAL LUMBAR INTERBODY FUSION (TLIF) WITH PEDICLE SCREW FIXATION 1 LEVEL Left 08/16/2020   Procedure: Left Lumbar two-three Transforaminal lumbar interbody fusion with exploration and extension of adjacent level fusion;  Surgeon: Erline Levine, MD;  Location: [place];  Service: Neurosurgery;  Laterality: Left;    Social History   Social History   Tobacco Use   Smoking status: Former    Packs/day: 0.50    Years: 15.00    Total pack years: 7.50    Types: Cigarettes    Quit date: 1978    Years since quitting: 46.1   Smokeless tobacco: Never  Vaping Use   Vaping Use: Never used  Substance Use Topics   Alcohol use: No   Drug use: No    Medications   Prior to Admission medications   Medication Sig Start Date End Date Taking? Authorizing Provider  ALPRAZolam Duanne Moron) 0.5 MG tablet Take 0.5 mg by mouth 3 (three) times daily as needed for anxiety.    Yes [provider]  aspirin 81 MG chewable tablet Chew 81 mg by mouth daily.   Yes [provider]  atenolol (TENORMIN) 25 MG tablet Take 25 mg by mouth daily. 06/03/17  Yes [provider]  dicyclomine (BENTYL) 10 MG capsule TAKE 1 CAPSULE(10 MG) BY MOUTH THREE TIMES DAILY BEFORE MEALS 03/29/22  Yes Noralyn Pick, NP  febuxostat (ULORIC) 40 MG tablet Take 40 mg by mouth daily.    Yes [provider]  latanoprost (XALATAN) 0.005 % ophthalmic solution Place 1 drop into both eyes at bedtime.   Yes [provider]  levothyroxine (SYNTHROID, LEVOTHROID) 50 MCG tablet Take 50-100 mcg by mouth See admin instructions. Take 50 mcg daily, except 100 mcg by mouth on Sunday   Yes [provider]   methocarbamol (ROBAXIN) 500 MG tablet Take 1 tablet (500 mg total) by mouth every 6 (six) hours as needed for muscle spasms. 08/18/20  Yes Marvis Moeller, NP  Multiple Vitamins-Minerals (MULTIVITAMIN WITH MINERALS) tablet Take 1 tablet by mouth daily.   Yes [provider]  pravastatin (PRAVACHOL) 20 MG tablet Take 20 mg by mouth every evening. 12/07/14  Yes [provider]  telmisartan (MICARDIS) 40 MG tablet Take 40 mg by mouth daily. 03/16/20  Yes [provider]  tirzepatide (  MOUNJARO) 12.5 MG/0.5ML Pen Inject 1 pen as directed into the skin once weekly 06/13/21  Yes   tirzepatide (MOUNJARO) 12.5 MG/0.5ML Pen Inject 12.5 mg into the skin once a week 09/12/21  Yes   tirzepatide (MOUNJARO) 12.5 MG/0.5ML Pen Inject 12.5 mg into the skin once a week. 05/01/22  Yes   ciclopirox (PENLAC) 8 % solution Apply topically at bedtime. Apply over nail and surrounding skin. Apply daily over previous coat. After seven  days, remove with alcohol and continue cycle. 06/13/21   McDonald, Stephan Minister, DPM  COVID-19 mRNA bivalent vaccine, Pfizer, (PFIZER COVID-19 VAC BIVALENT) injection Inject into the muscle. 02/23/21   Carlyle Basques, MD  furosemide (LASIX) 20 MG tablet Take 20 mg by mouth daily. 06/09/21   [provider]  HYDROmorphone (DILAUDID) 2 MG tablet Take 0.5-1 tablets (1-2 mg total) by mouth every 4 (four) hours as needed for severe pain. 08/18/20   Marvis Moeller, NP    Allergies   Allergies  Allergen Reactions   Contrast Media [Iodinated Contrast Media] Shortness Of Breath and Other (See Comments)    bp elevated   Triptans Other (See Comments)    Possible TIA symptoms.   Adhesive [Tape] Other (See Comments)    Pulls skin off   Esomeprazole Magnesium Hives   Morphine And Related Other (See Comments)    Severe headache  chills   Talwin [Pentazocine] Other (See Comments)    hallucinations   Acetaminophen     Other reaction(s): Stomach pains,vomiting    Ceftriaxone     Other reaction(s): rash   Allopurinol Diarrhea   Codeine Nausea And Vomiting   Colchicine Diarrhea   Hydrocodone Nausea And Vomiting   Meperidine Hcl Nausea And Vomiting   Oxycodone Nausea And Vomiting   Pentazocine Lactate Nausea And Vomiting    Review of Systems  ROS  Neurologic Exam  Awake, alert, oriented Memory and concentration grossly intact Speech fluent, appropriate CN grossly intact Motor exam: Upper Extremities Deltoid Bicep Tricep Grip  Right 5/5 5/5 5/5 5/5  Left 5/5 5/5 5/5 5/5   Lower Extremities IP Quad PF DF EHL  Right 5/5 5/5 5/5 5/5 5/5  Left 5/5 5/5 5/5 5/5 5/5   Sensation grossly intact to LT    Impression  - 77 y.o. 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 We have also reviewed in detail the risks, benefits, and alternatives to the procedure. All questions were answered and [name] provided informed consent to proceed.  Lindsey Lose, MD [facility]

## 2022-06-12 NOTE — Brief Op Note (Signed)
  NEUROSURGERY BRIEF OPERATIVE  NOTE   PREOP DX: Pulsatile Tinnitus  POSTOP DX: Same  PROCEDURE: Diagnostic cerebral angiogram  SURGEON: Dr. Consuella Lose, MD  ANESTHESIA: IV Sedation with Local  APPROACH: Right trans-femoral  EBL: Minimal  SPECIMENS: None  COMPLICATIONS: None  CONDITION: Stable to recovery  FINDINGS (Full report in [facility]): 1. No aneurysms, AVM, or fistulas seen. 2. No dural venous sinus stenosis seen   Consuella Lose, MD [facility]

## 2022-06-12 NOTE — Progress Notes (Signed)
Patient surgical site looks intact. Neuro is intact. No complaints from patient. Discharge instruction was giving to family and patient

## 2022-06-12 NOTE — Progress Notes (Signed)
Patient had all three of her prednisone doses last night at 1930, this morning at 0130 and again at 0630.

## 2022-06-15 ENCOUNTER — Other Ambulatory Visit (HOSPITAL_COMMUNITY): Payer: Self-pay

## 2022-06-15 MED ORDER — MOUNJARO 12.5 MG/0.5ML ~~LOC~~ SOAJ
SUBCUTANEOUS | 3 refills | Status: DC
  Filled 2022-06-15: qty 2, 28d supply, fill #0
  Filled 2022-07-12: qty 6, 84d supply, fill #0
  Filled 2022-10-02: qty 2, 28d supply, fill #1
  Filled 2022-10-24: qty 2, 28d supply, fill #2
  Filled 2022-11-22: qty 2, 28d supply, fill #3
  Filled 2023-01-16: qty 2, 28d supply, fill #4
  Filled 2023-02-11: qty 2, 28d supply, fill #5
  Filled 2023-03-13: qty 2, 28d supply, fill #6
  Filled 2023-04-08: qty 2, 28d supply, fill #7
  Filled 2023-04-23 – 2023-04-29 (×2): qty 2, 28d supply, fill #8
  Filled 2023-05-28: qty 2, 28d supply, fill #9

## 2022-06-21 ENCOUNTER — Other Ambulatory Visit (HOSPITAL_COMMUNITY): Payer: Self-pay

## 2022-06-27 ENCOUNTER — Other Ambulatory Visit (HOSPITAL_COMMUNITY): Payer: Self-pay

## 2022-07-03 ENCOUNTER — Other Ambulatory Visit: Payer: Self-pay | Admitting: Nurse Practitioner

## 2022-07-03 NOTE — Telephone Encounter (Signed)
Please advise 

## 2022-07-12 ENCOUNTER — Other Ambulatory Visit: Payer: Self-pay

## 2022-07-12 ENCOUNTER — Other Ambulatory Visit (HOSPITAL_COMMUNITY): Payer: Self-pay

## 2022-07-25 ENCOUNTER — Ambulatory Visit: Payer: Medicare Other | Attending: Family Medicine

## 2022-07-25 DIAGNOSIS — R2681 Unsteadiness on feet: Secondary | ICD-10-CM | POA: Insufficient documentation

## 2022-07-25 DIAGNOSIS — M6281 Muscle weakness (generalized): Secondary | ICD-10-CM | POA: Insufficient documentation

## 2022-07-25 DIAGNOSIS — R262 Difficulty in walking, not elsewhere classified: Secondary | ICD-10-CM | POA: Diagnosis present

## 2022-07-25 NOTE — Therapy (Signed)
OUTPATIENT PHYSICAL THERAPY NEURO EVALUATION   Patient Name: [name] MRN: [id number] DOB:[birth year], 77 y.o., female Today's Date: 07/25/2022   PCP: Donnajean Lopes, MD REFERRING PROVIDER: Geoffery Lyons, NP  END OF SESSION:  PT End of Session - 07/25/22 1401     Visit Number 1    Number of Visits 12    Date for PT Re-Evaluation 09/05/22    Authorization Type [employer]    Progress Note Due on Visit 10    PT Start Time 1400    PT Stop Time 1445    PT Time Calculation (min) 45 min             Past Medical History:  Diagnosis Date   Acute blood loss as cause of postoperative anemia 04/03/2016   Adenomatous colon polyp    Anemia    hx of   Anxiety    takes Xanax daily as needed   Arthritis    Bronchitis    a month ago   Chronic back pain    stenosis   Chronic kidney disease    stage 3-sees Dr.J Patel   Class 3 obesity due to excess calories with body mass index (BMI) of 40.0 to 44.9 in adult    Diabetes mellitus    type II; takes Byetta and Invokana daily   Diverticulosis    Glaucoma    uses Eye Drops   Gout    takes Uloric daily   Hepatic steatosis    History of blood transfusion    no abnormal reaction    History of colon polyps    Hyperlipidemia    no meds taken now   Hypertension    takes HCTZ daily as well as Aldactone   Hypothyroidism    takes Synthroid daily   Pneumonia    many yrs ago   PONV (postoperative nausea and vomiting)    Primary localized osteoarthritis of right knee    Stress incontinence    Syncope    Venous stenosis of right upper extremity    Weakness    numbness and tingling in right hip/eg/   Wears glasses    Wolf-Parkinson-White syndrome    takes Atenolol daily   Past Surgical History:  Procedure Laterality Date   ABDOMINAL HYSTERECTOMY     APPENDECTOMY     BACK SURGERY  2006   lumb fusion   BACK SURGERY  08/16/2014   L3-4 fusion   BREAST BIOPSY     BREAST ENHANCEMENT SURGERY Bilateral     BREAST EXCISIONAL BIOPSY Left    benign   CARDIAC CATHETERIZATION  >87yrs ago   CARPAL TUNNEL RELEASE Bilateral    x4   cataract surgery Bilateral    CESAREAN SECTION     x 3   CHOLECYSTECTOMY  1981   COLONOSCOPY     IR ANGIO EXTERNAL CAROTID SEL EXT CAROTID UNI R MOD SED  06/12/2022   IR ANGIO INTRA EXTRACRAN SEL COM CAROTID INNOMINATE UNI L MOD SED  06/12/2022   IR ANGIO INTRA EXTRACRAN SEL INTERNAL CAROTID UNI R MOD SED  06/12/2022   IR ANGIO VERTEBRAL SEL VERTEBRAL BILAT MOD SED  06/12/2022   KNEE ARTHROSCOPY Right 2009   rt   RADIOLOGY WITH ANESTHESIA N/A 05/12/2020   Procedure: MRI LUMBAR SPINE WITH AND WITHOUT CONTRAST;  Surgeon: Radiologist, Medication, MD;  Location: [place];  Service: Radiology;  Laterality: N/A;   RAZ procedure  10+yrs ago   REDUCTION MAMMAPLASTY Bilateral 2006  ROTATOR CUFF REPAIR Right    x 4   SHOULDER ARTHROSCOPY WITH ROTATOR CUFF REPAIR AND SUBACROMIAL DECOMPRESSION Right 06/10/2012   Procedure: SHOULDER ARTHROSCOPY WITH ROTATOR CUFF REPAIR AND SUBACROMIAL DECOMPRESSION;  Surgeon: Lorn Junes, MD;  Location: [place];  Service: Orthopedics;  Laterality: Right;  RIGHT SHOULDER ARTHROSCOPY, DECOMPRESSION SUBACROMIAL PARTIAL ACROMIOPLASTY WITH CORACOACROMIAL RELEASE,, DISTAL CLAVICULECTOMY , ROTATOR CUFF debriedment   SKIN BIOPSY     removed moles all over body   THYROID SURGERY Right    right thyroid removed   TOTAL KNEE ARTHROPLASTY Right 04/02/2016   Procedure: RIGHT TOTAL KNEE ARTHROPLASTY;  Surgeon: Elsie Saas, MD;  Location: [place];  Service: Orthopedics;  Laterality: Right;   TRANSFORAMINAL LUMBAR INTERBODY FUSION (TLIF) WITH PEDICLE SCREW FIXATION 1 LEVEL Left 08/16/2020   Procedure: Left Lumbar two-three Transforaminal lumbar interbody fusion with exploration and extension of adjacent level fusion;  Surgeon: Erline Levine, MD;  Location: [place];  Service: Neurosurgery;  Laterality: Left;   Patient Active Problem List   Diagnosis  Date Noted   Migraine without status migrainosus, not intractable 02/26/2022   Stroke-like symptom In the setting of illness (vomiting, diarrhea, 3 vaccinations) 02/26/2022   Degenerative lumbar spinal stenosis 08/16/2020   Acute blood loss as cause of postoperative anemia 04/03/2016   Class 3 obesity due to excess calories with body mass index (BMI) of 40.0 to 44.9 in adult    Primary localized osteoarthritis of right knee    CKD (chronic kidney disease), stage III ([place]) 10/22/2015   Syncope 10/21/2015   Non-insulin dependent type 2 diabetes mellitus ([place]) 10/21/2015   Hypothyroidism 10/21/2015   Lumbar stenosis with neurogenic claudication 08/16/2014   Right rotator cuff tear    Anxiety    PONV (postoperative nausea and vomiting)    GERD (gastroesophageal reflux disease)    Arthritis    Thyroid disease    DIVERTICULOSIS-COLON 07/04/2009   Iron deficiency anemia 03/17/2009   PERSONAL HX COLONIC POLYPS 03/17/2009   SKIN RASH 02/23/2009   DIARRHEA 02/23/2009   FEVER, HX OF 02/23/2009   THYROID NODULE, LEFT 02/11/2009   Glaucoma 02/11/2009   Coronary atherosclerosis 02/11/2009   WOLFF (WOLFE)-PARKINSON-WHITE (WPW) SYNDROME 02/11/2009   DEGENERATIVE JOINT DISEASE, ANKLE 02/11/2009   Osteoarthritis of spine 02/11/2009   Essential hypertension 02/09/2009    ONSET DATE: 1-2 months  REFERRING DIAG: R26.81 (ICD-10-CM) - Unsteadiness on feet  THERAPY DIAG:  Unsteadiness on feet  Muscle weakness (generalized)  Difficulty walking  Rationale for Evaluation and Treatment: Rehabilitation  SUBJECTIVE:  SUBJECTIVE STATEMENT: Right hip/buttock pain which has happened in the past pt reports falling backwards out of the shower x 2 months ago which jarred/bruised her right hip.  Pt reports past issues of  low BP and low HR which she attributes to medication dosage, this has been reconciled and notes improved vital signs since that time.  Pt reports similar experience of right hip/back pain and was treated at [facility] with PT and aquatic therapy. Pt has membership at [employer] and was performing aquatic activities.   Pt accompanied by: self  PERTINENT HISTORY: right TKR, lumbar fusion x 3  PAIN:  Are you having pain? Yes: NPRS scale: 7/10 Pain location: right lateral hip/buttocks Pain description: uncomfortable Aggravating factors: right sidelying Relieving factors: left sidelying, ice pack  PRECAUTIONS: None  WEIGHT BEARING RESTRICTIONS: No  FALLS: Has patient fallen in last 6 months? Yes. Number of falls 1  LIVING ENVIRONMENT: Lives with: lives alone Lives in: House/apartment Stairs: Yes: External: 2 steps; bilateral but cannot reach both Has following equipment at home: Single point cane, Walker - 4 wheeled, shower chair, and Grab bars  PLOF: Independent and Independent with household mobility without device  PATIENT GOALS: reduce pain and improve activity  OBJECTIVE:   DIAGNOSTIC FINDINGS:   COGNITION: Overall cognitive status: Within functional limits for tasks assessed   SENSATION: WFL  COORDINATION: WNL  EDEMA:  none  MUSCLE TONE: WNL  MUSCLE LENGTH: Hamstring WNL Piriformis tight/restricted R > L  PALPATION: Right piriformis muscle belly/sciatic notch very tender to palpation, trigger point appreciated   POSTURE: No Significant postural limitations  LOWER EXTREMITY ROM:     WFL  LOWER EXTREMITY MMT:    MMT Right Eval Left Eval  Hip flexion 3 4  Hip extension    Hip abduction 3+ 3+  Hip adduction 3 3  Hip internal rotation 3- 3+  Hip external rotation 3 (painful to resistance) 4  Knee flexion 3+ 4  Knee extension 4 5  Ankle dorsiflexion 5 5  Ankle plantarflexion    Ankle inversion    Ankle eversion    (Blank rows = not tested)  BED  MOBILITY:  Modified indep, increased time  TRANSFERS: Assistive device utilized: None  Sit to stand: Complete Independence and Modified independence Stand to sit: Complete Independence Chair to chair: Complete Independence and Modified independence Floor:  NT     GAIT: Gait pattern: antalgic Distance walked:  Assistive device utilized: None Level of assistance: Complete Independence and Modified independence Comments: incr time  FUNCTIONAL TESTS:  5 times sit to stand: 35 sec 10 meter walk test: NT Single leg stance: able to lift leg and maintain balance, 1-2 sec   TODAY'S TREATMENT:                                                                                                                              DATE: 07/25/22    PATIENT EDUCATION: Education details: assesment findings, HEP initiated,  assisted massage with tennis ball to site Person educated: Patient Education method: Explanation, Demonstration, and Handouts Education comprehension: needs further education  HOME EXERCISE PROGRAM: Access Code: [email or web address] URL: [email or web address] Date: 07/25/2022 Prepared by: Sherlyn Lees  Exercises - Supine Hip Adduction Isometric with Ball  - 1 x daily - 7 x weekly - 3 sets - 10 reps - 2 sec hold - Hooklying Clamshell with Resistance  - 1 x daily - 7 x weekly - 3 sets - 10 reps - 2 sec hold - Supine Piriformis Stretch with Leg Straight  - 1 x daily - 7 x weekly - 3 sets - 10 deep breaths hold - Seated Piriformis Stretch  - 1 x daily - 7 x weekly - 3 sets - 10 deep breaths hold  GOALS: Goals reviewed with patient? Yes  SHORT TERM GOALS: Target date: 08/15/2022    Patient will be independent in HEP to improve functional outcomes Baseline: Goal status: INITIAL  2.  Report pain in right buttocks not exceeding 4/10 with gait and exercise to improve activity tolerance Baseline: 7/10 Goal status: INITIAL    LONG TERM GOALS: Target date: 09/05/2022     Improve BLE strength and balance per time of 20 sec 5xSTS Baseline: 35 sec Goal status: INITIAL  2.  Pt to report right buttock pain not exceeding 2/10 with activity and sleep to improve gait tolerance and comfort Baseline: 7/10 Goal status: INITIAL  3.  Demo ability to maintain 2.62 ft/sec ambulation without increase in right buttock pain to improve community ambulation Baseline: 7/10 Goal status: INITIAL  4.  Improve single limb support/strength as evidenced by ability to maintain single limb stance x 10 sec Baseline: unable Goal status: INITIAL    ASSESSMENT:  CLINICAL IMPRESSION: Patient is a 77 y.o. lady who was seen today for physical therapy evaluation and treatment for unsteadiness on feet.  Pt presents and demonstrates right hip/buttock pain increased with resisted hip external rotation and also increased with stretch to these structures.  Pain and trigger point palpation along right piriformis which also reproduces pain. Ambulates with antalgic pattern and limited LE strength and activity tolerance.  Pt would benefit from PT services to address deficits and limitations to restore abilities to PLOF   OBJECTIVE IMPAIRMENTS: Abnormal gait, decreased activity tolerance, decreased balance, decreased mobility, difficulty walking, decreased strength, impaired perceived functional ability, increased muscle spasms, and pain.   ACTIVITY LIMITATIONS: carrying, sitting, squatting, stairs, transfers, and locomotion level  PARTICIPATION LIMITATIONS: cleaning, laundry, driving, shopping, community activity, and fitness  PERSONAL FACTORS: Age, Time since onset of injury/illness/exacerbation, and 1-2 comorbidities: obesity, right TKR  are also affecting patient's functional outcome.   REHAB POTENTIAL: Excellent  CLINICAL DECISION MAKING: Evolving/moderate complexity  EVALUATION COMPLEXITY: Moderate  PLAN:  PT FREQUENCY: 1-2x/week  PT DURATION: 6 weeks  PLANNED INTERVENTIONS:  Therapeutic exercises, Therapeutic activity, Neuromuscular re-education, Balance training, Gait training, Patient/Family education, Self Care, Joint mobilization, Stair training, Vestibular training, Canalith repositioning, Orthotic/Fit training, DME instructions, Aquatic Therapy, Dry Needling, Electrical stimulation, Spinal mobilization, Cryotherapy, Moist heat, Taping, Ultrasound, Ionotophoresis 4mg /ml Dexamethasone, and Manual therapy  PLAN FOR NEXT SESSION: 10 meter walk test (gait speed), gait train with cane, HEP review   3:24 PM, 07/25/22 M. Sherlyn Lees, PT, DPT Physical Therapist- [facility] Office Number: [phone]

## 2022-08-02 ENCOUNTER — Other Ambulatory Visit: Payer: Self-pay | Admitting: Nurse Practitioner

## 2022-08-02 NOTE — Telephone Encounter (Signed)
Please advise 

## 2022-08-06 ENCOUNTER — Ambulatory Visit: Payer: Medicare Other | Admitting: Physical Therapy

## 2022-08-08 ENCOUNTER — Ambulatory Visit: Payer: Medicare Other | Admitting: Physical Therapy

## 2022-08-09 NOTE — Therapy (Signed)
OUTPATIENT PHYSICAL THERAPY NEURO TREATMENT   Patient Name: [name] MRN: [id number] DOB:1945-07-15, 77 y.o., female Today's Date: 08/10/2022   PCP: Garlan Fillers, MD REFERRING PROVIDER: Olevia Perches, NP  END OF SESSION:  PT End of Session - 08/10/22 1012     Visit Number 2    Number of Visits 12    Date for PT Re-Evaluation 09/05/22    Authorization Type [employer]    Progress Note Due on Visit 10    PT Start Time 0933    PT Stop Time 1011    PT Time Calculation (min) 38 min    Equipment Utilized During Treatment Gait belt    Activity Tolerance Patient tolerated treatment well    Behavior During Therapy WFL for tasks assessed/performed              Past Medical History:  Diagnosis Date   Acute blood loss as cause of postoperative anemia 04/03/2016   Adenomatous colon polyp    Anemia    hx of   Anxiety    takes Xanax daily as needed   Arthritis    Bronchitis    a month ago   Chronic back pain    stenosis   Chronic kidney disease    stage 3-sees Dr.J Patel   Class 3 obesity due to excess calories with body mass index (BMI) of 40.0 to 44.9 in adult    Diabetes mellitus    type II; takes Byetta and Invokana daily   Diverticulosis    Glaucoma    uses Eye Drops   Gout    takes Uloric daily   Hepatic steatosis    History of blood transfusion    no abnormal reaction    History of colon polyps    Hyperlipidemia    no meds taken now   Hypertension    takes HCTZ daily as well as Aldactone   Hypothyroidism    takes Synthroid daily   Pneumonia    many yrs ago   PONV (postoperative nausea and vomiting)    Primary localized osteoarthritis of right knee    Stress incontinence    Syncope    Venous stenosis of right upper extremity    Weakness    numbness and tingling in right hip/eg/   Wears glasses    Wolf-Parkinson-White syndrome    takes Atenolol daily   Past Surgical History:  Procedure Laterality Date   ABDOMINAL  HYSTERECTOMY     APPENDECTOMY     BACK SURGERY  2006   lumb fusion   BACK SURGERY  08/16/2014   L3-4 fusion   BREAST BIOPSY     BREAST ENHANCEMENT SURGERY Bilateral    BREAST EXCISIONAL BIOPSY Left    benign   CARDIAC CATHETERIZATION  >45yrs ago   CARPAL TUNNEL RELEASE Bilateral    x4   cataract surgery Bilateral    CESAREAN SECTION     x 3   CHOLECYSTECTOMY  1981   COLONOSCOPY     IR ANGIO EXTERNAL CAROTID SEL EXT CAROTID UNI R MOD SED  06/12/2022   IR ANGIO INTRA EXTRACRAN SEL COM CAROTID INNOMINATE UNI L MOD SED  06/12/2022   IR ANGIO INTRA EXTRACRAN SEL INTERNAL CAROTID UNI R MOD SED  06/12/2022   IR ANGIO VERTEBRAL SEL VERTEBRAL BILAT MOD SED  06/12/2022   KNEE ARTHROSCOPY Right 2009   rt   RADIOLOGY WITH ANESTHESIA N/A 05/12/2020   Procedure: MRI LUMBAR SPINE WITH AND WITHOUT  CONTRAST;  Surgeon: Radiologist, Medication, MD;  Location: [facility];  Service: Radiology;  Laterality: N/A;   RAZ procedure  10+yrs ago   REDUCTION MAMMAPLASTY Bilateral 2006   ROTATOR CUFF REPAIR Right    x 4   SHOULDER ARTHROSCOPY WITH ROTATOR CUFF REPAIR AND SUBACROMIAL DECOMPRESSION Right 06/10/2012   Procedure: SHOULDER ARTHROSCOPY WITH ROTATOR CUFF REPAIR AND SUBACROMIAL DECOMPRESSION;  Surgeon: Nilda Simmer, MD;  Location: [facility];  Service: Orthopedics;  Laterality: Right;  RIGHT SHOULDER ARTHROSCOPY, DECOMPRESSION SUBACROMIAL PARTIAL ACROMIOPLASTY WITH CORACOACROMIAL RELEASE,, DISTAL CLAVICULECTOMY , ROTATOR CUFF debriedment   SKIN BIOPSY     removed moles all over body   THYROID SURGERY Right    right thyroid removed   TOTAL KNEE ARTHROPLASTY Right 04/02/2016   Procedure: RIGHT TOTAL KNEE ARTHROPLASTY;  Surgeon: Salvatore Marvel, MD;  Location: [facility];  Service: Orthopedics;  Laterality: Right;   TRANSFORAMINAL LUMBAR INTERBODY FUSION (TLIF) WITH PEDICLE SCREW FIXATION 1 LEVEL Left 08/16/2020   Procedure: Left Lumbar two-three Transforaminal lumbar interbody fusion with exploration  and extension of adjacent level fusion;  Surgeon: Maeola Harman, MD;  Location: [facility];  Service: Neurosurgery;  Laterality: Left;   Patient Active Problem List   Diagnosis Date Noted   Migraine without status migrainosus, not intractable 02/26/2022   Stroke-like symptom In the setting of illness (vomiting, diarrhea, 3 vaccinations) 02/26/2022   Degenerative lumbar spinal stenosis 08/16/2020   Acute blood loss as cause of postoperative anemia 04/03/2016   Class 3 obesity due to excess calories with body mass index (BMI) of 40.0 to 44.9 in adult    Primary localized osteoarthritis of right knee    CKD (chronic kidney disease), stage III 10/22/2015   Syncope 10/21/2015   Non-insulin dependent type 2 diabetes mellitus 10/21/2015   Hypothyroidism 10/21/2015   Lumbar stenosis with neurogenic claudication 08/16/2014   Right rotator cuff tear    Anxiety    PONV (postoperative nausea and vomiting)    GERD (gastroesophageal reflux disease)    Arthritis    Thyroid disease    DIVERTICULOSIS-COLON 07/04/2009   Iron deficiency anemia 03/17/2009   PERSONAL HX COLONIC POLYPS 03/17/2009   SKIN RASH 02/23/2009   DIARRHEA 02/23/2009   FEVER, HX OF 02/23/2009   THYROID NODULE, LEFT 02/11/2009   Glaucoma 02/11/2009   Coronary atherosclerosis 02/11/2009   WOLFF (WOLFE)-PARKINSON-WHITE (WPW) SYNDROME 02/11/2009   DEGENERATIVE JOINT DISEASE, ANKLE 02/11/2009   Osteoarthritis of spine 02/11/2009   Essential hypertension 02/09/2009    ONSET DATE: 1-2 months  REFERRING DIAG: R26.81 (ICD-10-CM) - Unsteadiness on feet  THERAPY DIAG:  Unsteadiness on feet  Muscle weakness (generalized)  Difficulty walking  Other low back pain  Rationale for Evaluation and Treatment: Rehabilitation  SUBJECTIVE:  SUBJECTIVE  STATEMENT: Not having a good day- having more LBP and have a knot in the R side of LB. This has been going on for 6 months.   Pt accompanied by: self  PERTINENT HISTORY: right TKR, lumbar fusion x 3  PAIN:  Are you having pain? Yes: NPRS scale: 7/10 Pain location: right LB Pain description: uncomfortable Aggravating factors: right sidelying Relieving factors: left sidelying, ice pack  PRECAUTIONS: None  WEIGHT BEARING RESTRICTIONS: No  FALLS: Has patient fallen in last 6 months? Yes. Number of falls 1  LIVING ENVIRONMENT: Lives with: lives alone Lives in: House/apartment Stairs: Yes: External: 2 steps; bilateral but cannot reach both Has following equipment at home: Single point cane, Walker - 4 wheeled, shower chair, and Grab bars  PLOF: Independent and Independent with household mobility without device  PATIENT GOALS: reduce pain and improve activity  OBJECTIVE:      TODAY'S TREATMENT: 08/10/22 Activity Comments  STM and trigger pt release  Large palpable and tender trigger pts in R glute and piriformis; pt TTP over R SIJ  HEP review: supine ball squeeze 10x5" piriformis stretch with leg straight supine clam blue TB 10x sitting fig 4 stretch 30" Adjusted supine piriformis stretch to hooklying stretch d/t poor tolerance. Heavy cueing to avoid pushing into pain with these activities.  gait train with SPC and quad tip cane 174ft Changed to cane in L hand for max stability; improved balance with quad tip            PATIENT EDUCATION: Education details: edu on quad tip cane attachment Person educated: Patient Education method: Explanation, Demonstration, Tactile cues, and Verbal cues Education comprehension: verbalized understanding   Below measures were taken at time of initial evaluation unless otherwise specified:   DIAGNOSTIC FINDINGS:   COGNITION: Overall cognitive status: Within functional limits for tasks  assessed   SENSATION: WFL  COORDINATION: WNL  EDEMA:  none  MUSCLE TONE: WNL  MUSCLE LENGTH: Hamstring WNL Piriformis tight/restricted R > L  PALPATION: Right piriformis muscle belly/sciatic notch very tender to palpation, trigger point appreciated   POSTURE: No Significant postural limitations  LOWER EXTREMITY ROM:     WFL  LOWER EXTREMITY MMT:    MMT Right Eval Left Eval  Hip flexion 3 4  Hip extension    Hip abduction 3+ 3+  Hip adduction 3 3  Hip internal rotation 3- 3+  Hip external rotation 3 (painful to resistance) 4  Knee flexion 3+ 4  Knee extension 4 5  Ankle dorsiflexion 5 5  Ankle plantarflexion    Ankle inversion    Ankle eversion    (Blank rows = not tested)  BED MOBILITY:  Modified indep, increased time  TRANSFERS: Assistive device utilized: None  Sit to stand: Complete Independence and Modified independence Stand to sit: Complete Independence Chair to chair: Complete Independence and Modified independence Floor:  NT     GAIT: Gait pattern: antalgic Distance walked:  Assistive device utilized: None Level of assistance: Complete Independence and Modified independence Comments: incr time  FUNCTIONAL TESTS:  5 times sit to stand: 35 sec 10 meter walk test: NT Single leg stance: able to lift leg and maintain balance, 1-2 sec   TODAY'S TREATMENT:  DATE: 07/25/22    PATIENT EDUCATION: Education details: assesment findings, HEP initiated, assisted massage with tennis ball to site Person educated: Patient Education method: Explanation, Demonstration, and Handouts Education comprehension: needs further education  HOME EXERCISE PROGRAM: Access Code: [email or web address] URL: [email or web address] Date: 07/25/2022 Prepared by: Shary Decamp  Exercises - Supine Hip Adduction Isometric with Ball  - 1 x  daily - 7 x weekly - 3 sets - 10 reps - 2 sec hold - Hooklying Clamshell with Resistance  - 1 x daily - 7 x weekly - 3 sets - 10 reps - 2 sec hold - Supine Piriformis Stretch with Leg Straight  - 1 x daily - 7 x weekly - 3 sets - 10 deep breaths hold - Seated Piriformis Stretch  - 1 x daily - 7 x weekly - 3 sets - 10 deep breaths hold  GOALS: Goals reviewed with patient? Yes  SHORT TERM GOALS: Target date: 08/15/2022    Patient will be independent in HEP to improve functional outcomes Baseline: Goal status: IN PROGRESS  2.  Report pain in right buttocks not exceeding 4/10 with gait and exercise to improve activity tolerance Baseline: 7/10 Goal status: IN PROGRESS    LONG TERM GOALS: Target date: 09/05/2022    Improve BLE strength and balance per time of 20 sec 5xSTS Baseline: 35 sec Goal status: IN PROGRESS  2.  Pt to report right buttock pain not exceeding 2/10 with activity and sleep to improve gait tolerance and comfort Baseline: 7/10 Goal status: IN PROGRESS  3.  Demo ability to maintain 2.62 ft/sec ambulation without increase in right buttock pain to improve community ambulation Baseline: 7/10 Goal status: IN PROGRESS  4.  Improve single limb support/strength as evidenced by ability to maintain single limb stance x 10 sec Baseline: unable Goal status: IN PROGRESS    ASSESSMENT:  CLINICAL IMPRESSION: Patient arrived to session with report of increased pain in the R LB/buttock this week. Upon palpation, patient TTP over R SIJ and main point of pain and with multipole tender trigger pts in glutes and piriformis. Good improvement in soft tissue restriction evident after MT. Reviewed HEP which required cueing to avoid pushing into pain with activities. Gait training with different canes was provided and adjusted for max stability. Patient requested loaning quad tip cane to use over the weekend d/t not feeling steady on feet with her cane. No complaints at end of session.    OBJECTIVE IMPAIRMENTS: Abnormal gait, decreased activity tolerance, decreased balance, decreased mobility, difficulty walking, decreased strength, impaired perceived functional ability, increased muscle spasms, and pain.   ACTIVITY LIMITATIONS: carrying, sitting, squatting, stairs, transfers, and locomotion level  PARTICIPATION LIMITATIONS: cleaning, laundry, driving, shopping, community activity, and fitness  PERSONAL FACTORS: Age, Time since onset of injury/illness/exacerbation, and 1-2 comorbidities: obesity, right TKR  are also affecting patient's functional outcome.   REHAB POTENTIAL: Excellent  CLINICAL DECISION MAKING: Evolving/moderate complexity  EVALUATION COMPLEXITY: Moderate  PLAN:  PT FREQUENCY: 1-2x/week  PT DURATION: 6 weeks  PLANNED INTERVENTIONS: Therapeutic exercises, Therapeutic activity, Neuromuscular re-education, Balance training, Gait training, Patient/Family education, Self Care, Joint mobilization, Stair training, Vestibular training, Canalith repositioning, Orthotic/Fit training, DME instructions, Aquatic Therapy, Dry Needling, Electrical stimulation, Spinal mobilization, Cryotherapy, Moist heat, Taping, Ultrasound, Ionotophoresis 4mg /ml Dexamethasone, and Manual therapy  PLAN FOR NEXT SESSION: 10 meter walk test (gait speed), gait train with cane, HEP review    Anette Guarneri, PT, DPT 08/10/22 10:13 AM  [facility] [address] [place], [place] [address] Phone # [phone] Fax # [phone]

## 2022-08-10 ENCOUNTER — Ambulatory Visit: Payer: Medicare Other | Attending: Family Medicine | Admitting: Physical Therapy

## 2022-08-10 ENCOUNTER — Encounter: Payer: Self-pay | Admitting: Physical Therapy

## 2022-08-10 DIAGNOSIS — M5459 Other low back pain: Secondary | ICD-10-CM | POA: Diagnosis present

## 2022-08-10 DIAGNOSIS — R262 Difficulty in walking, not elsewhere classified: Secondary | ICD-10-CM

## 2022-08-10 DIAGNOSIS — M6281 Muscle weakness (generalized): Secondary | ICD-10-CM | POA: Diagnosis present

## 2022-08-10 DIAGNOSIS — R2681 Unsteadiness on feet: Secondary | ICD-10-CM | POA: Insufficient documentation

## 2022-08-10 NOTE — Therapy (Signed)
OUTPATIENT PHYSICAL THERAPY NEURO TREATMENT   Patient Name: [name] MRN: [id number] DOB:05-17-1945, 77 y.o., female Today's Date: 08/13/2022   PCP: Garlan Fillers, MD REFERRING PROVIDER: Olevia Perches, NP  END OF SESSION:  PT End of Session - 08/13/22 1442     Visit Number 3    Number of Visits 12    Date for PT Re-Evaluation 09/05/22    Authorization Type [employer]    Progress Note Due on Visit 10    PT Start Time 1359    PT Stop Time 1442    PT Time Calculation (min) 43 min    Equipment Utilized During Treatment Gait belt    Activity Tolerance Patient tolerated treatment well    Behavior During Therapy WFL for tasks assessed/performed               Past Medical History:  Diagnosis Date   Acute blood loss as cause of postoperative anemia 04/03/2016   Adenomatous colon polyp    Anemia    hx of   Anxiety    takes Xanax daily as needed   Arthritis    Bronchitis    a month ago   Chronic back pain    stenosis   Chronic kidney disease    stage 3-sees Dr.J Patel   Class 3 obesity due to excess calories with body mass index (BMI) of 40.0 to 44.9 in adult    Diabetes mellitus    type II; takes Byetta and Invokana daily   Diverticulosis    Glaucoma    uses Eye Drops   Gout    takes Uloric daily   Hepatic steatosis    History of blood transfusion    no abnormal reaction    History of colon polyps    Hyperlipidemia    no meds taken now   Hypertension    takes HCTZ daily as well as Aldactone   Hypothyroidism    takes Synthroid daily   Pneumonia    many yrs ago   PONV (postoperative nausea and vomiting)    Primary localized osteoarthritis of right knee    Stress incontinence    Syncope    Venous stenosis of right upper extremity    Weakness    numbness and tingling in right hip/eg/   Wears glasses    Wolf-Parkinson-White syndrome    takes Atenolol daily   Past Surgical History:  Procedure Laterality Date   ABDOMINAL  HYSTERECTOMY     APPENDECTOMY     BACK SURGERY  2006   lumb fusion   BACK SURGERY  08/16/2014   L3-4 fusion   BREAST BIOPSY     BREAST ENHANCEMENT SURGERY Bilateral    BREAST EXCISIONAL BIOPSY Left    benign   CARDIAC CATHETERIZATION  >24yrs ago   CARPAL TUNNEL RELEASE Bilateral    x4   cataract surgery Bilateral    CESAREAN SECTION     x 3   CHOLECYSTECTOMY  1981   COLONOSCOPY     IR ANGIO EXTERNAL CAROTID SEL EXT CAROTID UNI R MOD SED  06/12/2022   IR ANGIO INTRA EXTRACRAN SEL COM CAROTID INNOMINATE UNI L MOD SED  06/12/2022   IR ANGIO INTRA EXTRACRAN SEL INTERNAL CAROTID UNI R MOD SED  06/12/2022   IR ANGIO VERTEBRAL SEL VERTEBRAL BILAT MOD SED  06/12/2022   KNEE ARTHROSCOPY Right 2009   rt   RADIOLOGY WITH ANESTHESIA N/A 05/12/2020   Procedure: MRI LUMBAR SPINE WITH AND  WITHOUT CONTRAST;  Surgeon: Radiologist, Medication, MD;  Location: [facility] OR;  Service: Radiology;  Laterality: N/A;   RAZ procedure  10+yrs ago   REDUCTION MAMMAPLASTY Bilateral 2006   ROTATOR CUFF REPAIR Right    x 4   SHOULDER ARTHROSCOPY WITH ROTATOR CUFF REPAIR AND SUBACROMIAL DECOMPRESSION Right 06/10/2012   Procedure: SHOULDER ARTHROSCOPY WITH ROTATOR CUFF REPAIR AND SUBACROMIAL DECOMPRESSION;  Surgeon: Nilda Simmer, MD;  Location: [facility];  Service: Orthopedics;  Laterality: Right;  RIGHT SHOULDER ARTHROSCOPY, DECOMPRESSION SUBACROMIAL PARTIAL ACROMIOPLASTY WITH CORACOACROMIAL RELEASE,, DISTAL CLAVICULECTOMY , ROTATOR CUFF debriedment   SKIN BIOPSY     removed moles all over body   THYROID SURGERY Right    right thyroid removed   TOTAL KNEE ARTHROPLASTY Right 04/02/2016   Procedure: RIGHT TOTAL KNEE ARTHROPLASTY;  Surgeon: Salvatore Marvel, MD;  Location: [facility] OR;  Service: Orthopedics;  Laterality: Right;   TRANSFORAMINAL LUMBAR INTERBODY FUSION (TLIF) WITH PEDICLE SCREW FIXATION 1 LEVEL Left 08/16/2020   Procedure: Left Lumbar two-three Transforaminal lumbar interbody fusion with exploration  and extension of adjacent level fusion;  Surgeon: Maeola Harman, MD;  Location[facility];  Service: Neurosurgery;  Laterality: Left;   Patient Active Problem List   Diagnosis Date Noted   Migraine without status migrainosus, not intractable 02/26/2022   Stroke-like symptom In the setting of illness (vomiting, diarrhea, 3 vaccinations) 02/26/2022   Degenerative lumbar spinal stenosis 08/16/2020   Acute blood loss as cause of postoperative anemia 04/03/2016   Class 3 obesity due to excess calories with body mass index (BMI) of 40.0 to 44.9 in adult    Primary localized osteoarthritis of right knee    CKD (chronic kidney disease), stage III 10/22/2015   Syncope 10/21/2015   Non-insulin dependent type 2 diabetes mellitus 10/21/2015   Hypothyroidism 10/21/2015   Lumbar stenosis with neurogenic claudication 08/16/2014   Right rotator cuff tear    Anxiety    PONV (postoperative nausea and vomiting)    GERD (gastroesophageal reflux disease)    Arthritis    Thyroid disease    DIVERTICULOSIS-COLON 07/04/2009   Iron deficiency anemia 03/17/2009   PERSONAL HX COLONIC POLYPS 03/17/2009   SKIN RASH 02/23/2009   DIARRHEA 02/23/2009   FEVER, HX OF 02/23/2009   THYROID NODULE, LEFT 02/11/2009   Glaucoma 02/11/2009   Coronary atherosclerosis 02/11/2009   WOLFF (WOLFE)-PARKINSON-WHITE (WPW) SYNDROME 02/11/2009   DEGENERATIVE JOINT DISEASE, ANKLE 02/11/2009   Osteoarthritis of spine 02/11/2009   Essential hypertension 02/09/2009    ONSET DATE: 1-2 months  REFERRING DIAG: R26.81 (ICD-10-CM) - Unsteadiness on feet  THERAPY DIAG:  Unsteadiness on feet  Muscle weakness (generalized)  Difficulty walking  Other low back pain  Rationale for Evaluation and Treatment: Rehabilitation  SUBJECTIVE:  SUBJECTIVE  STATEMENT: New cane helps with balance a lot. Feel pretty good today- Saturday night was rough. Felt pretty good after last session.   Pt accompanied by: self  PERTINENT HISTORY: right TKR, lumbar fusion x 3  PAIN:  Are you having pain? Yes: NPRS scale: 4-5/10 Pain location: right LB Pain description: uncomfortable Aggravating factors: right sidelying Relieving factors: left sidelying, ice pack  PRECAUTIONS: None  WEIGHT BEARING RESTRICTIONS: No  FALLS: Has patient fallen in last 6 months? Yes. Number of falls 1  LIVING ENVIRONMENT: Lives with: lives alone Lives in: House/apartment Stairs: Yes: External: 2 steps; bilateral but cannot reach both Has following equipment at home: Single point cane, Walker - 4 wheeled, shower chair, and Grab bars  PLOF: Independent and Independent with household mobility without device  PATIENT GOALS: reduce pain and improve activity  OBJECTIVE:     TODAY'S TREATMENT: 08/13/22 Activity Comments  10 meter walk test 25.4 sec with quad tip cane ; 1.29 ft/sec  clamshells sidelying 10x each, then with yellow TB 10x  Good form; c/o "pulling" but agreement to much more difficult with TB ; required manual cues to prevent posterior trunk rotation  Hooklying KTOS and fig 4 stretch 30" each, each LE Heavy cueing to avoid pushing into pain; had to stop at ~ 20 on R side sec d/t discomfort   sitting hip IR 2x10 Soccer ball b/w knees   Sidestepping along counter 2x 1 min Cues to maintain hips neutral and stand tall ; required sit break after completing   alt toe tap onto ledge under sink 2x20 Finger tip support   Stair navigation x2 With cane and 1 UE support; cueing for sequencing and CGA           Below measures were taken at time of initial evaluation unless otherwise specified:   DIAGNOSTIC FINDINGS:   COGNITION: Overall cognitive status: Within functional limits for tasks assessed   SENSATION: WFL  COORDINATION: WNL  EDEMA:   none  MUSCLE TONE: WNL  MUSCLE LENGTH: Hamstring WNL Piriformis tight/restricted R > L  PALPATION: Right piriformis muscle belly/sciatic notch very tender to palpation, trigger point appreciated   POSTURE: No Significant postural limitations  LOWER EXTREMITY ROM:     WFL  LOWER EXTREMITY MMT:    MMT Right Eval Left Eval  Hip flexion 3 4  Hip extension    Hip abduction 3+ 3+  Hip adduction 3 3  Hip internal rotation 3- 3+  Hip external rotation 3 (painful to resistance) 4  Knee flexion 3+ 4  Knee extension 4 5  Ankle dorsiflexion 5 5  Ankle plantarflexion    Ankle inversion    Ankle eversion    (Blank rows = not tested)  BED MOBILITY:  Modified indep, increased time  TRANSFERS: Assistive device utilized: None  Sit to stand: Complete Independence and Modified independence Stand to sit: Complete Independence Chair to chair: Complete Independence and Modified independence Floor:  NT     GAIT: Gait pattern: antalgic Distance walked:  Assistive device utilized: None Level of assistance: Complete Independence and Modified independence Comments: incr time  FUNCTIONAL TESTS:  5 times sit to stand: 35 sec 10 meter walk test: NT Single leg stance: able to lift leg and maintain balance, 1-2 sec   TODAY'S TREATMENT:  DATE: 07/25/22    PATIENT EDUCATION: Education details: assesment findings, HEP initiated, assisted massage with tennis ball to site Person educated: Patient Education method: Explanation, Demonstration, and Handouts Education comprehension: needs further education  HOME EXERCISE PROGRAM: Access Code: [email or web address] URL: [email or web address] Date: 07/25/2022 Prepared by: Shary Decamp  Exercises - Supine Hip Adduction Isometric with Ball  - 1 x daily - 7 x weekly - 3 sets - 10 reps - 2 sec hold - Hooklying  Clamshell with Resistance  - 1 x daily - 7 x weekly - 3 sets - 10 reps - 2 sec hold - Supine Piriformis Stretch with Leg Straight  - 1 x daily - 7 x weekly - 3 sets - 10 deep breaths hold - Seated Piriformis Stretch  - 1 x daily - 7 x weekly - 3 sets - 10 deep breaths hold  GOALS: Goals reviewed with patient? Yes  SHORT TERM GOALS: Target date: 08/15/2022    Patient will be independent in HEP to improve functional outcomes Baseline: reports understanding 08/13/22 Goal status: MET 08/13/22  2.  Report pain in right buttocks not exceeding 4/10 with gait and exercise to improve activity tolerance Baseline: 7/10; reports pain usually increases after these activities but the worst at night 08/13/22 Goal status: IN PROGRESS 08/13/22    LONG TERM GOALS: Target date: 09/05/2022    Improve BLE strength and balance per time of 20 sec 5xSTS Baseline: 35 sec Goal status: IN PROGRESS  2.  Pt to report right buttock pain not exceeding 2/10 with activity and sleep to improve gait tolerance and comfort Baseline: 7/10 Goal status: IN PROGRESS  3.  Demo ability to maintain 2.62 ft/sec ambulation without increase in right buttock pain to improve community ambulation Baseline: 1.29 ft/sec 08/13/22 Goal status: IN PROGRESS 08/13/22  4.  Improve single limb support/strength as evidenced by ability to maintain single limb stance x 10 sec Baseline: unable Goal status: IN PROGRESS    ASSESSMENT:  CLINICAL IMPRESSION: Patient arrived to session with report of improved balance now that she has a quad tip cane. Worked on LE strengthening to address deep hip rotators. Still required occasional cueing to avoid pushing into pain with activities and cueing for proper form and avoiding compensations also given. Balance activities at counter top required short sitting breaks d/t c/o fatigue. Patient required light UE support for most activities to maintain stability. Stair navigation was initiated with cane as  patient reports having to ascend/descend sideways. No complaints at end of session.   OBJECTIVE IMPAIRMENTS: Abnormal gait, decreased activity tolerance, decreased balance, decreased mobility, difficulty walking, decreased strength, impaired perceived functional ability, increased muscle spasms, and pain.   ACTIVITY LIMITATIONS: carrying, sitting, squatting, stairs, transfers, and locomotion level  PARTICIPATION LIMITATIONS: cleaning, laundry, driving, shopping, community activity, and fitness  PERSONAL FACTORS: Age, Time since onset of injury/illness/exacerbation, and 1-2 comorbidities: obesity, right TKR  are also affecting patient's functional outcome.   REHAB POTENTIAL: Excellent  CLINICAL DECISION MAKING: Evolving/moderate complexity  EVALUATION COMPLEXITY: Moderate  PLAN:  PT FREQUENCY: 1-2x/week  PT DURATION: 6 weeks  PLANNED INTERVENTIONS: Therapeutic exercises, Therapeutic activity, Neuromuscular re-education, Balance training, Gait training, Patient/Family education, Self Care, Joint mobilization, Stair training, Vestibular training, Canalith repositioning, Orthotic/Fit training, DME instructions, Aquatic Therapy, Dry Needling, Electrical stimulation, Spinal mobilization, Cryotherapy, Moist heat, Taping, Ultrasound, Ionotophoresis /ml Dexamethasone, and Manual therapy  PLAN FOR NEXT SESSION: hip strengthening, balance, HEP review    Anette Guarneri, PT, DPT 08/13/22 2:44 PM  [facility]  at [facility] [address] [place], [place] [address] Phone # [phone] Fax # [phone]

## 2022-08-13 ENCOUNTER — Ambulatory Visit: Payer: Medicare Other | Admitting: Physical Therapy

## 2022-08-13 ENCOUNTER — Encounter: Payer: Self-pay | Admitting: Physical Therapy

## 2022-08-13 DIAGNOSIS — R2681 Unsteadiness on feet: Secondary | ICD-10-CM | POA: Diagnosis not present

## 2022-08-13 DIAGNOSIS — M5459 Other low back pain: Secondary | ICD-10-CM

## 2022-08-13 DIAGNOSIS — M6281 Muscle weakness (generalized): Secondary | ICD-10-CM

## 2022-08-13 DIAGNOSIS — R262 Difficulty in walking, not elsewhere classified: Secondary | ICD-10-CM

## 2022-08-14 NOTE — Therapy (Signed)
OUTPATIENT PHYSICAL THERAPY NEURO TREATMENT   Patient Name: [name] MRN: [id number] DOB:1946/01/03, 77 y.o., female Today's Date: 08/15/2022   PCP: Garlan Fillers, MD REFERRING PROVIDER: Olevia Perches, NP  END OF SESSION:  PT End of Session - 08/15/22 1443     Visit Number 4    Number of Visits 12    Date for PT Re-Evaluation 09/05/22    Authorization Type [employer]    Progress Note Due on Visit 10    PT Start Time 1404    PT Stop Time 1450    PT Time Calculation (min) 46 min    Equipment Utilized During Treatment Gait belt    Activity Tolerance Patient tolerated treatment well    Behavior During Therapy WFL for tasks assessed/performed                Past Medical History:  Diagnosis Date   Acute blood loss as cause of postoperative anemia 04/03/2016   Adenomatous colon polyp    Anemia    hx of   Anxiety    takes Xanax daily as needed   Arthritis    Bronchitis    a month ago   Chronic back pain    stenosis   Chronic kidney disease    stage 3-sees Dr.J Patel   Class 3 obesity due to excess calories with body mass index (BMI) of 40.0 to 44.9 in adult    Diabetes mellitus    type II; takes Byetta and Invokana daily   Diverticulosis    Glaucoma    uses Eye Drops   Gout    takes Uloric daily   Hepatic steatosis    History of blood transfusion    no abnormal reaction    History of colon polyps    Hyperlipidemia    no meds taken now   Hypertension    takes HCTZ daily as well as Aldactone   Hypothyroidism    takes Synthroid daily   Pneumonia    many yrs ago   PONV (postoperative nausea and vomiting)    Primary localized osteoarthritis of right knee    Stress incontinence    Syncope    Venous stenosis of right upper extremity    Weakness    numbness and tingling in right hip/eg/   Wears glasses    Wolf-Parkinson-White syndrome    takes Atenolol daily   Past Surgical History:  Procedure Laterality Date   ABDOMINAL  HYSTERECTOMY     APPENDECTOMY     BACK SURGERY  2006   lumb fusion   BACK SURGERY  08/16/2014   L3-4 fusion   BREAST BIOPSY     BREAST ENHANCEMENT SURGERY Bilateral    BREAST EXCISIONAL BIOPSY Left    benign   CARDIAC CATHETERIZATION  >62yrs ago   CARPAL TUNNEL RELEASE Bilateral    x4   cataract surgery Bilateral    CESAREAN SECTION     x 3   CHOLECYSTECTOMY  1981   COLONOSCOPY     IR ANGIO EXTERNAL CAROTID SEL EXT CAROTID UNI R MOD SED  06/12/2022   IR ANGIO INTRA EXTRACRAN SEL COM CAROTID INNOMINATE UNI L MOD SED  06/12/2022   IR ANGIO INTRA EXTRACRAN SEL INTERNAL CAROTID UNI R MOD SED  06/12/2022   IR ANGIO VERTEBRAL SEL VERTEBRAL BILAT MOD SED  06/12/2022   KNEE ARTHROSCOPY Right 2009   rt   RADIOLOGY WITH ANESTHESIA N/A 05/12/2020   Procedure: MRI LUMBAR SPINE WITH  AND WITHOUT CONTRAST;  Surgeon: Radiologist, Medication, MD;  Location: [facility] OR;  Service: Radiology;  Laterality: N/A;   RAZ procedure  10+yrs ago   REDUCTION MAMMAPLASTY Bilateral 2006   ROTATOR CUFF REPAIR Right    x 4   SHOULDER ARTHROSCOPY WITH ROTATOR CUFF REPAIR AND SUBACROMIAL DECOMPRESSION Right 06/10/2012   Procedure: SHOULDER ARTHROSCOPY WITH ROTATOR CUFF REPAIR AND SUBACROMIAL DECOMPRESSION;  Surgeon: Nilda Simmer, MD;  Location: [facility];  Service: Orthopedics;  Laterality: Right;  RIGHT SHOULDER ARTHROSCOPY, DECOMPRESSION SUBACROMIAL PARTIAL ACROMIOPLASTY WITH CORACOACROMIAL RELEASE,, DISTAL CLAVICULECTOMY , ROTATOR CUFF debriedment   SKIN BIOPSY     removed moles all over body   THYROID SURGERY Right    right thyroid removed   TOTAL KNEE ARTHROPLASTY Right 04/02/2016   Procedure: RIGHT TOTAL KNEE ARTHROPLASTY;  Surgeon: Salvatore Marvel, MD;  Location: [facility] OR;  Service: Orthopedics;  Laterality: Right;   TRANSFORAMINAL LUMBAR INTERBODY FUSION (TLIF) WITH PEDICLE SCREW FIXATION 1 LEVEL Left 08/16/2020   Procedure: Left Lumbar two-three Transforaminal lumbar interbody fusion with exploration  and extension of adjacent level fusion;  Surgeon: Maeola Harman, MD;  Location: [facility] OR;  Service: Neurosurgery;  Laterality: Left;   Patient Active Problem List   Diagnosis Date Noted   Migraine without status migrainosus, not intractable 02/26/2022   Stroke-like symptom In the setting of illness (vomiting, diarrhea, 3 vaccinations) 02/26/2022   Degenerative lumbar spinal stenosis 08/16/2020   Acute blood loss as cause of postoperative anemia 04/03/2016   Class 3 obesity due to excess calories with body mass index (BMI) of 40.0 to 44.9 in adult    Primary localized osteoarthritis of right knee    CKD (chronic kidney disease), stage III 10/22/2015   Syncope 10/21/2015   Non-insulin dependent type 2 diabetes mellitus 10/21/2015   Hypothyroidism 10/21/2015   Lumbar stenosis with neurogenic claudication 08/16/2014   Right rotator cuff tear    Anxiety    PONV (postoperative nausea and vomiting)    GERD (gastroesophageal reflux disease)    Arthritis    Thyroid disease    DIVERTICULOSIS-COLON 07/04/2009   Iron deficiency anemia 03/17/2009   PERSONAL HX COLONIC POLYPS 03/17/2009   SKIN RASH 02/23/2009   DIARRHEA 02/23/2009   FEVER, HX OF 02/23/2009   THYROID NODULE, LEFT 02/11/2009   Glaucoma 02/11/2009   Coronary atherosclerosis 02/11/2009   WOLFF (WOLFE)-PARKINSON-WHITE (WPW) SYNDROME 02/11/2009   DEGENERATIVE JOINT DISEASE, ANKLE 02/11/2009   Osteoarthritis of spine 02/11/2009   Essential hypertension 02/09/2009    ONSET DATE: 1-2 months  REFERRING DIAG: R26.81 (ICD-10-CM) - Unsteadiness on feet  THERAPY DIAG:  Unsteadiness on feet  Muscle weakness (generalized)  Difficulty walking  Other low back pain  Rationale for Evaluation and Treatment: Rehabilitation  SUBJECTIVE:  SUBJECTIVE  STATEMENT: Not hurting as bad as I was- was tired after last session. I slept the best I have in a long time. Felt pretty good the next day.  Pt accompanied by: self  PERTINENT HISTORY: right TKR, lumbar fusion x 3  PAIN:  Are you having pain? Yes: NPRS scale: 4-5/10 Pain location: right LB Pain description: uncomfortable Aggravating factors: right sidelying Relieving factors: left sidelying, ice pack  PRECAUTIONS: None  WEIGHT BEARING RESTRICTIONS: No  FALLS: Has patient fallen in last 6 months? Yes. Number of falls 1  LIVING ENVIRONMENT: Lives with: lives alone Lives in: House/apartment Stairs: Yes: External: 2 steps; bilateral but cannot reach both Has following equipment at home: Single point cane, Walker - 4 wheeled, shower chair, and Grab bars  PLOF: Independent and Independent with household mobility without device  PATIENT GOALS: reduce pain and improve activity  OBJECTIVE:     TODAY'S TREATMENT: 08/15/22 Activity Comments  Nustep L3x 6 min Ues/LEs For LE ROM and flexibility   standing hip ABD 10x each Difficulty and limited amplitude   standing hip EX 10x each Cues to depress shoulders  mini squat 2x10 Great form   Bridge 2x10 Cueing to isolate glutes   LTR 2x10 Cues to avoid pushing into pain   E-stim IFC to R LB 801-150 hz, amplitude to tolerance, 10 min also with MHP to R LB Pt tolerated well    PATIENT EDUCATION: Education details: answered pt's questions about nustep ; edu on exercises that could be safely performed in the pool; edu on TENS/E-stim  Person educated: Patient Education method: Programmer, multimedia, Demonstration, Tactile cues, and Verbal cues Education comprehension: verbalized understanding    Below measures were taken at time of initial evaluation unless otherwise specified:   DIAGNOSTIC FINDINGS:   COGNITION: Overall cognitive status: Within functional limits for tasks assessed   SENSATION: WFL  COORDINATION: WNL  EDEMA:   none  MUSCLE TONE: WNL  MUSCLE LENGTH: Hamstring WNL Piriformis tight/restricted R > L  PALPATION: Right piriformis muscle belly/sciatic notch very tender to palpation, trigger point appreciated   POSTURE: No Significant postural limitations  LOWER EXTREMITY ROM:     WFL  LOWER EXTREMITY MMT:    MMT Right Eval Left Eval  Hip flexion 3 4  Hip extension    Hip abduction 3+ 3+  Hip adduction 3 3  Hip internal rotation 3- 3+  Hip external rotation 3 (painful to resistance) 4  Knee flexion 3+ 4  Knee extension 4 5  Ankle dorsiflexion 5 5  Ankle plantarflexion    Ankle inversion    Ankle eversion    (Blank rows = not tested)  BED MOBILITY:  Modified indep, increased time  TRANSFERS: Assistive device utilized: None  Sit to stand: Complete Independence and Modified independence Stand to sit: Complete Independence Chair to chair: Complete Independence and Modified independence Floor:  NT     GAIT: Gait pattern: antalgic Distance walked:  Assistive device utilized: None Level of assistance: Complete Independence and Modified independence Comments: incr time  FUNCTIONAL TESTS:  5 times sit to stand: 35 sec 10 meter walk test: NT Single leg stance: able to lift leg and maintain balance, 1-2 sec   TODAY'S TREATMENT:  DATE: 07/25/22    PATIENT EDUCATION: Education details: assesment findings, HEP initiated, assisted massage with tennis ball to site Person educated: Patient Education method: Explanation, Demonstration, and Handouts Education comprehension: needs further education  HOME EXERCISE PROGRAM: Access Code: [email or web address] URL: [email or web address] Date: 07/25/2022 Prepared by: Shary Decamp  Exercises - Supine Hip Adduction Isometric with Ball  - 1 x daily - 7 x weekly - 3 sets - 10 reps - 2 sec hold - Hooklying  Clamshell with Resistance  - 1 x daily - 7 x weekly - 3 sets - 10 reps - 2 sec hold - Supine Piriformis Stretch with Leg Straight  - 1 x daily - 7 x weekly - 3 sets - 10 deep breaths hold - Seated Piriformis Stretch  - 1 x daily - 7 x weekly - 3 sets - 10 deep breaths hold  GOALS: Goals reviewed with patient? Yes  SHORT TERM GOALS: Target date: 08/15/2022    Patient will be independent in HEP to improve functional outcomes Baseline: reports understanding 08/13/22 Goal status: MET 08/13/22  2.  Report pain in right buttocks not exceeding 4/10 with gait and exercise to improve activity tolerance Baseline: 7/10; reports pain usually increases after these activities but the worst at night 08/13/22 Goal status: IN PROGRESS 08/13/22    LONG TERM GOALS: Target date: 09/05/2022    Improve BLE strength and balance per time of 20 sec 5xSTS Baseline: 35 sec Goal status: IN PROGRESS  2.  Pt to report right buttock pain not exceeding 2/10 with activity and sleep to improve gait tolerance and comfort Baseline: 7/10 Goal status: IN PROGRESS  3.  Demo ability to maintain 2.62 ft/sec ambulation without increase in right buttock pain to improve community ambulation Baseline: 1.29 ft/sec 08/13/22 Goal status: IN PROGRESS 08/13/22  4.  Improve single limb support/strength as evidenced by ability to maintain single limb stance x 10 sec Baseline: unable Goal status: IN PROGRESS    ASSESSMENT:  CLINICAL IMPRESSION:  Patient arrived to session with feeling well after last session. Continued worked on standing hip strengthening and balance activities. Hip abduction revealed trunk lean as compensation and limited amplitude d/t weakness. Improved standing tolerance evident compared to previous sessions, however patient still required short sit breaks to address pain/fatigue. Glute strengthening activities were well-tolerated today. Patient agreeable to trying e-stim for R LB/hip pain today. Tolerated this  modality and reported good tolerance. No complaints at end of session.   OBJECTIVE IMPAIRMENTS: Abnormal gait, decreased activity tolerance, decreased balance, decreased mobility, difficulty walking, decreased strength, impaired perceived functional ability, increased muscle spasms, and pain.   ACTIVITY LIMITATIONS: carrying, sitting, squatting, stairs, transfers, and locomotion level  PARTICIPATION LIMITATIONS: cleaning, laundry, driving, shopping, community activity, and fitness  PERSONAL FACTORS: Age, Time since onset of injury/illness/exacerbation, and 1-2 comorbidities: obesity, right TKR  are also affecting patient's functional outcome.   REHAB POTENTIAL: Excellent  CLINICAL DECISION MAKING: Evolving/moderate complexity  EVALUATION COMPLEXITY: Moderate  PLAN:  PT FREQUENCY: 1-2x/week  PT DURATION: 6 weeks  PLANNED INTERVENTIONS: Therapeutic exercises, Therapeutic activity, Neuromuscular re-education, Balance training, Gait training, Patient/Family education, Self Care, Joint mobilization, Stair training, Vestibular training, Canalith repositioning, Orthotic/Fit training, DME instructions, Aquatic Therapy, Dry Needling, Electrical stimulation, Spinal mobilization, Cryotherapy, Moist heat, Taping, Ultrasound, Ionotophoresis /ml Dexamethasone, and Manual therapy  PLAN FOR NEXT SESSION: hip strengthening, balance, HEP review    Anette Guarneri, PT, DPT 08/15/22 3:13 PM  [facility] [address] [place], [address] [place],  [place] [address] Phone # [phone] Fax # [phone]

## 2022-08-15 ENCOUNTER — Ambulatory Visit: Payer: Medicare Other | Admitting: Physical Therapy

## 2022-08-15 ENCOUNTER — Encounter: Payer: Self-pay | Admitting: Physical Therapy

## 2022-08-15 DIAGNOSIS — M5459 Other low back pain: Secondary | ICD-10-CM

## 2022-08-15 DIAGNOSIS — R2681 Unsteadiness on feet: Secondary | ICD-10-CM | POA: Diagnosis not present

## 2022-08-15 DIAGNOSIS — R262 Difficulty in walking, not elsewhere classified: Secondary | ICD-10-CM

## 2022-08-15 DIAGNOSIS — M6281 Muscle weakness (generalized): Secondary | ICD-10-CM

## 2022-08-15 NOTE — Progress Notes (Signed)
HPI: Follow-up WPW.  Previously followed by Dr. Katrinka Blazing.  Now transitioning to me.  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  Monitor August 2017 showed sinus rhythm and occasional morphologic appearance of WPW.  Nuclear study December 2019 showed ejection fraction 62% and normal perfusion.  Coronary CTA January 2021 showed calcium score 62 which was 68 percentile and minimal plaque in the LAD and RCA.  Cerebral arteriogram February 2024 was normal with no significant AV malformation, aneurysm or fistula.  No stenosis noted.  Since last seen the patient denies any dyspnea on exertion, orthopnea, PND, pedal edema, palpitations, syncope or chest pain.   Current Outpatient Medications  Medication Sig Dispense Refill   ALPRAZolam (XANAX) 0.5 MG tablet Take 0.5 mg by mouth 3 (three) times daily as needed for anxiety.      aspirin 81 MG chewable tablet Chew 81 mg by mouth daily.     atenolol (TENORMIN) 25 MG tablet Take 25 mg by mouth daily.     COVID-19 mRNA bivalent vaccine, Pfizer, (PFIZER COVID-19 VAC BIVALENT) injection Inject into the muscle. 0.3 mL 0   dicyclomine (BENTYL) 10 MG capsule TAKE 1 CAPSULE(10 MG) BY MOUTH THREE TIMES DAILY BEFORE MEALS 90 capsule 0   febuxostat (ULORIC) 40 MG tablet Take 40 mg by mouth daily.      furosemide (LASIX) 20 MG tablet Take 20 mg by mouth daily.     latanoprost (XALATAN) 0.005 % ophthalmic solution Place 1 drop into both eyes at bedtime.     levothyroxine (SYNTHROID, LEVOTHROID) 50 MCG tablet Take 50-100 mcg by mouth See admin instructions. Take 50 mcg daily, except 100 mcg by mouth on Sunday     methocarbamol (ROBAXIN) 500 MG tablet Take 1 tablet (500 mg total) by mouth every 6 (six) hours as needed for muscle spasms. 90 tablet 0   Multiple Vitamins-Minerals (MULTIVITAMIN WITH MINERALS) tablet Take 1 tablet by mouth daily.     pravastatin (PRAVACHOL) 20 MG tablet Take 20 mg by mouth  every evening.     telmisartan (MICARDIS) 40 MG tablet Take 40 mg by mouth daily.     tirzepatide (MOUNJARO) 12.5 MG/0.5ML Pen Inject 1 pen as directed into the skin once weekly 2 mL 2   tirzepatide (MOUNJARO) 12.5 MG/0.5ML Pen Inject 12.5 mg into the skin once a week 6 mL 1   tirzepatide (MOUNJARO) 12.5 MG/0.5ML Pen Inject 12.5 mg into the skin once a week. 2 mL 5   tirzepatide (MOUNJARO) 12.5 MG/0.5ML Pen Inject 12.5 mg under the skin once a week 6 mL 3   ciclopirox (PENLAC) 8 % solution Apply topically at bedtime. Apply over nail and surrounding skin. Apply daily over previous coat. After seven  days, remove with alcohol and continue cycle. (Patient not taking: Reported on 08/24/2022) 6.6 mL 2   HYDROmorphone (DILAUDID) 2 MG tablet Take 0.5-1 tablets (1-2 mg total) by mouth every 4 (four) hours as needed for severe pain. (Patient not taking: Reported on 08/24/2022) 30 tablet 0   Current Facility-Administered Medications  Medication Dose Route Frequency Provider Last Rate Last Admin   0.9 %  sodium chloride infusion  500 mL Intravenous Once Meryl Dare, MD         Past Medical History:  Diagnosis Date   Acute blood loss as cause of postoperative anemia 04/03/2016   Adenomatous colon polyp    Anemia    hx of   Anxiety  takes Xanax daily as needed   Arthritis    Bronchitis    a month ago   Chronic back pain    stenosis   Chronic kidney disease    stage 3-sees Dr.J Patel   Class 3 obesity due to excess calories with body mass index (BMI) of 40.0 to 44.9 in adult    Diabetes mellitus    type II; takes Byetta and Invokana daily   Diverticulosis    Glaucoma    uses Eye Drops   Gout    takes Uloric daily   Hepatic steatosis    History of blood transfusion    no abnormal reaction    History of colon polyps    Hyperlipidemia    no meds taken now   Hypertension    takes HCTZ daily as well as Aldactone   Hypothyroidism    takes Synthroid daily   Pneumonia    many yrs ago    PONV (postoperative nausea and vomiting)    Primary localized osteoarthritis of right knee    Stress incontinence    Syncope    Venous stenosis of right upper extremity    Weakness    numbness and tingling in right hip/eg/   Wears glasses    Wolf-Parkinson-White syndrome    takes Atenolol daily    Past Surgical History:  Procedure Laterality Date   ABDOMINAL HYSTERECTOMY     APPENDECTOMY     BACK SURGERY  2006   lumb fusion   BACK SURGERY  08/16/2014   L3-4 fusion   BREAST BIOPSY     BREAST ENHANCEMENT SURGERY Bilateral    BREAST EXCISIONAL BIOPSY Left    benign   CARDIAC CATHETERIZATION  >68yrs ago   CARPAL TUNNEL RELEASE Bilateral    x4   cataract surgery Bilateral    CESAREAN SECTION     x 3   CHOLECYSTECTOMY  1981   COLONOSCOPY     IR ANGIO EXTERNAL CAROTID SEL EXT CAROTID UNI R MOD SED  06/12/2022   IR ANGIO INTRA EXTRACRAN SEL COM CAROTID INNOMINATE UNI L MOD SED  06/12/2022   IR ANGIO INTRA EXTRACRAN SEL INTERNAL CAROTID UNI R MOD SED  06/12/2022   IR ANGIO VERTEBRAL SEL VERTEBRAL BILAT MOD SED  06/12/2022   KNEE ARTHROSCOPY Right 2009   rt   RADIOLOGY WITH ANESTHESIA N/A 05/12/2020   Procedure: MRI LUMBAR SPINE WITH AND WITHOUT CONTRAST;  Surgeon: Radiologist, Medication, MD;  Location: [facility] OR;  Service: Radiology;  Laterality: N/A;   RAZ procedure  10+yrs ago   REDUCTION MAMMAPLASTY Bilateral 2006   ROTATOR CUFF REPAIR Right    x 4   SHOULDER ARTHROSCOPY WITH ROTATOR CUFF REPAIR AND SUBACROMIAL DECOMPRESSION Right 06/10/2012   Procedure: SHOULDER ARTHROSCOPY WITH ROTATOR CUFF REPAIR AND SUBACROMIAL DECOMPRESSION;  Surgeon: Nilda Simmer, MD;  Location: [facility];  Service: Orthopedics;  Laterality: Right;  RIGHT SHOULDER ARTHROSCOPY, DECOMPRESSION SUBACROMIAL PARTIAL ACROMIOPLASTY WITH CORACOACROMIAL RELEASE,, DISTAL CLAVICULECTOMY , ROTATOR CUFF debriedment   SKIN BIOPSY     removed moles all over body   THYROID SURGERY Right    right thyroid  removed   TOTAL KNEE ARTHROPLASTY Right 04/02/2016   Procedure: RIGHT TOTAL KNEE ARTHROPLASTY;  Surgeon: Salvatore Marvel, MD;  Location: [facility] OR;  Service: Orthopedics;  Laterality: Right;   TRANSFORAMINAL LUMBAR INTERBODY FUSION (TLIF) WITH PEDICLE SCREW FIXATION 1 LEVEL Left 08/16/2020   Procedure: Left Lumbar two-three Transforaminal lumbar interbody fusion with exploration and extension of adjacent  level fusion;  Surgeon: Maeola Harman, MD;  Location: [facility] OR;  Service: Neurosurgery;  Laterality: Left;    Social History   Socioeconomic History   Marital status: Single    Spouse name: Not on file   Number of children: Not on file   Years of education: Not on file   Highest education level: Not on file  Occupational History   Not on file  Tobacco Use   Smoking status: Former    Packs/day: 0.50    Years: 15.00    Additional pack years: 0.00    Total pack years: 7.50    Types: Cigarettes    Quit date: 23    Years since quitting: 46.3   Smokeless tobacco: Never  Vaping Use   Vaping Use: Never used  Substance and Sexual Activity   Alcohol use: No   Drug use: No   Sexual activity: Not on file    Comment: Hysterectomy  Other Topics Concern   Not on file  Social History Narrative   Not on file   Social Determinants of Health   Financial Resource Strain: Not on file  Food Insecurity: Not on file  Transportation Needs: Not on file  Physical Activity: Not on file  Stress: Not on file  Social Connections: Not on file  Intimate Partner Violence: Not on file    Family History  Problem Relation Age of Onset   Diabetes Mother    Stroke Mother    Heart attack Father    Hypertension Other    Arthritis Other    Breast cancer Neg Hx    Colon cancer Neg Hx    Esophageal cancer Neg Hx    Rectal cancer Neg Hx    Stomach cancer Neg Hx    Migraines Neg Hx     ROS: Back pain but no fevers or chills, productive cough, hemoptysis, dysphasia, odynophagia, melena, hematochezia,  dysuria, hematuria, rash, seizure activity, orthopnea, PND, pedal edema, claudication. Remaining systems are negative.  Physical Exam: Well-developed well-nourished in no acute distress.  Skin is warm and dry.  HEENT is normal.  Neck is supple.  Chest is clear to auscultation with normal expansion.  Cardiovascular exam is regular rate and rhythm.  Abdominal exam nontender or distended. No masses palpated. Extremities show no edema. neuro grossly intact   A/P  1 coronary artery disease-minimal plaque noted on previous coronary CTA.  2 hypertension-patient's blood pressure is controlled.  Continue present medical regimen.  3 hyperlipidemia-continue statin.  5 history of WPW-no preexcitation noted on electrocardiogram today.  No recent palpitations.  Olga Millers, MD

## 2022-08-20 ENCOUNTER — Ambulatory Visit: Payer: Medicare Other

## 2022-08-20 DIAGNOSIS — R2681 Unsteadiness on feet: Secondary | ICD-10-CM

## 2022-08-20 DIAGNOSIS — M6281 Muscle weakness (generalized): Secondary | ICD-10-CM

## 2022-08-20 DIAGNOSIS — R262 Difficulty in walking, not elsewhere classified: Secondary | ICD-10-CM

## 2022-08-20 DIAGNOSIS — M5459 Other low back pain: Secondary | ICD-10-CM

## 2022-08-20 NOTE — Therapy (Signed)
OUTPATIENT PHYSICAL THERAPY NEURO TREATMENT   Patient Name: [name] MRN: [id number] DOB:27-May-1945, 77 y.o., female Today's Date: 08/20/2022   PCP: Garlan Fillers, MD REFERRING PROVIDER: Olevia Perches, NP  END OF SESSION:  PT End of Session - 08/20/22 1358     Visit Number 5    Number of Visits 12    Date for PT Re-Evaluation 09/05/22    Authorization Type [employer]    Progress Note Due on Visit 10    PT Start Time 1400    PT Stop Time 1445    PT Time Calculation (min) 45 min    Equipment Utilized During Treatment Gait belt    Activity Tolerance Patient tolerated treatment well    Behavior During Therapy WFL for tasks assessed/performed                Past Medical History:  Diagnosis Date   Acute blood loss as cause of postoperative anemia 04/03/2016   Adenomatous colon polyp    Anemia    hx of   Anxiety    takes Xanax daily as needed   Arthritis    Bronchitis    a month ago   Chronic back pain    stenosis   Chronic kidney disease    stage 3-sees Dr.J Patel   Class 3 obesity due to excess calories with body mass index (BMI) of 40.0 to 44.9 in adult    Diabetes mellitus    type II; takes Byetta and Invokana daily   Diverticulosis    Glaucoma    uses Eye Drops   Gout    takes Uloric daily   Hepatic steatosis    History of blood transfusion    no abnormal reaction    History of colon polyps    Hyperlipidemia    no meds taken now   Hypertension    takes HCTZ daily as well as Aldactone   Hypothyroidism    takes Synthroid daily   Pneumonia    many yrs ago   PONV (postoperative nausea and vomiting)    Primary localized osteoarthritis of right knee    Stress incontinence    Syncope    Venous stenosis of right upper extremity    Weakness    numbness and tingling in right hip/eg/   Wears glasses    Wolf-Parkinson-White syndrome    takes Atenolol daily   Past Surgical History:  Procedure Laterality Date   ABDOMINAL  HYSTERECTOMY     APPENDECTOMY     BACK SURGERY  2006   lumb fusion   BACK SURGERY  08/16/2014   L3-4 fusion   BREAST BIOPSY     BREAST ENHANCEMENT SURGERY Bilateral    BREAST EXCISIONAL BIOPSY Left    benign   CARDIAC CATHETERIZATION  >25yrs ago   CARPAL TUNNEL RELEASE Bilateral    x4   cataract surgery Bilateral    CESAREAN SECTION     x 3   CHOLECYSTECTOMY  1981   COLONOSCOPY     IR ANGIO EXTERNAL CAROTID SEL EXT CAROTID UNI R MOD SED  06/12/2022   IR ANGIO INTRA EXTRACRAN SEL COM CAROTID INNOMINATE UNI L MOD SED  06/12/2022   IR ANGIO INTRA EXTRACRAN SEL INTERNAL CAROTID UNI R MOD SED  06/12/2022   IR ANGIO VERTEBRAL SEL VERTEBRAL BILAT MOD SED  06/12/2022   KNEE ARTHROSCOPY Right 2009   rt   RADIOLOGY WITH ANESTHESIA N/A 05/12/2020   Procedure: MRI LUMBAR SPINE WITH  AND WITHOUT CONTRAST;  Surgeon: Radiologist, Medication, MD;  Location: [facility] OR;  Service: Radiology;  Laterality: N/A;   RAZ procedure  10+yrs ago   REDUCTION MAMMAPLASTY Bilateral 2006   ROTATOR CUFF REPAIR Right    x 4   SHOULDER ARTHROSCOPY WITH ROTATOR CUFF REPAIR AND SUBACROMIAL DECOMPRESSION Right 06/10/2012   Procedure: SHOULDER ARTHROSCOPY WITH ROTATOR CUFF REPAIR AND SUBACROMIAL DECOMPRESSION;  Surgeon: Nilda Simmer, MD;  Location: [facility];  Service: Orthopedics;  Laterality: Right;  RIGHT SHOULDER ARTHROSCOPY, DECOMPRESSION SUBACROMIAL PARTIAL ACROMIOPLASTY WITH CORACOACROMIAL RELEASE,, DISTAL CLAVICULECTOMY , ROTATOR CUFF debriedment   SKIN BIOPSY     removed moles all over body   THYROID SURGERY Right    right thyroid removed   TOTAL KNEE ARTHROPLASTY Right 04/02/2016   Procedure: RIGHT TOTAL KNEE ARTHROPLASTY;  Surgeon: Salvatore Marvel, MD;  Location: [facility] OR;  Service: Orthopedics;  Laterality: Right;   TRANSFORAMINAL LUMBAR INTERBODY FUSION (TLIF) WITH PEDICLE SCREW FIXATION 1 LEVEL Left 08/16/2020   Procedure: Left Lumbar two-three Transforaminal lumbar interbody fusion with exploration  and extension of adjacent level fusion;  Surgeon: Maeola Harman, MD;  Location: [facility] OR;  Service: Neurosurgery;  Laterality: Left;   Patient Active Problem List   Diagnosis Date Noted   Migraine without status migrainosus, not intractable 02/26/2022   Stroke-like symptom In the setting of illness (vomiting, diarrhea, 3 vaccinations) 02/26/2022   Degenerative lumbar spinal stenosis 08/16/2020   Acute blood loss as cause of postoperative anemia 04/03/2016   Class 3 obesity due to excess calories with body mass index (BMI) of 40.0 to 44.9 in adult    Primary localized osteoarthritis of right knee    CKD (chronic kidney disease), stage III 10/22/2015   Syncope 10/21/2015   Non-insulin dependent type 2 diabetes mellitus 10/21/2015   Hypothyroidism 10/21/2015   Lumbar stenosis with neurogenic claudication 08/16/2014   Right rotator cuff tear    Anxiety    PONV (postoperative nausea and vomiting)    GERD (gastroesophageal reflux disease)    Arthritis    Thyroid disease    DIVERTICULOSIS-COLON 07/04/2009   Iron deficiency anemia 03/17/2009   PERSONAL HX COLONIC POLYPS 03/17/2009   SKIN RASH 02/23/2009   DIARRHEA 02/23/2009   FEVER, HX OF 02/23/2009   THYROID NODULE, LEFT 02/11/2009   Glaucoma 02/11/2009   Coronary atherosclerosis 02/11/2009   WOLFF (WOLFE)-PARKINSON-WHITE (WPW) SYNDROME 02/11/2009   DEGENERATIVE JOINT DISEASE, ANKLE 02/11/2009   Osteoarthritis of spine 02/11/2009   Essential hypertension 02/09/2009    ONSET DATE: 1-2 months  REFERRING DIAG: R26.81 (ICD-10-CM) - Unsteadiness on feet  THERAPY DIAG:  Unsteadiness on feet  Muscle weakness (generalized)  Difficulty walking  Other low back pain  Difficulty in walking, not elsewhere classified  Rationale for Evaluation and Treatment: Rehabilitation  SUBJECTIVE:  SUBJECTIVE STATEMENT: The knot in the right buttocks has improved greatly.  A little stiffness from the weather but much better.  The e-stim helps a lot with pain  Pt accompanied by: self  PERTINENT HISTORY: right TKR, lumbar fusion x 3  PAIN:  Are you having pain? Yes: NPRS scale: 1-2/10 Pain location: right LB Pain description: uncomfortable Aggravating factors: right sidelying Relieving factors: left sidelying, ice pack  PRECAUTIONS: None  WEIGHT BEARING RESTRICTIONS: No  FALLS: Has patient fallen in last 6 months? Yes. Number of falls 1  LIVING ENVIRONMENT: Lives with: lives alone Lives in: House/apartment Stairs: Yes: External: 2 steps; bilateral but cannot reach both Has following equipment at home: Single point cane, Walker - 4 wheeled, shower chair, and Grab bars  PLOF: Independent and Independent with household mobility without device  PATIENT GOALS: reduce pain and improve activity  OBJECTIVE:   TODAY'S TREATMENT: 08/20/22 Activity Comments  Seated hip abd 2x10 Yellow loop  Sidestepping x 2 min Yellow loop around knees  Mini-squats 2x10 Yellow loop around knees-cues in sequence/foot placement  Standing hip abd 2x10  Yellow loop around ankles  Standing hip extension Yellow loop around ankles  Seated hip IR 2x10 Yellow loop around ankles, ball between knees  LTR 2x10 Cues for direction/sequence  Bridge 2x10  2 sec hold  RLE piriformis stretching Manually assisted -SKTC -KNTOS -PROM hip circles  NU-step resistance and speed intervals 2 min warmup. 2x30 sec heavy 2x60 sec light resistance intervals.  Speed intervals: 30 sec fast 60 sec slow      TODAY'S TREATMENT: 08/15/22 Activity Comments  Nustep L3x 6 min Ues/LEs For LE ROM and flexibility   standing hip ABD 10x each Difficulty and limited amplitude   standing hip EX 10x each Cues to depress shoulders  mini squat 2x10 Great form   Bridge 2x10 Cueing to isolate glutes    LTR 2x10 Cues to avoid pushing into pain   E-stim IFC to R LB 801-150 hz, amplitude to tolerance, 10 min also with MHP to R LB Pt tolerated well   PATIENT EDUCATION: Education details: assesment findings, HEP initiated, assisted massage with tennis ball to site Person educated: Patient Education method: Explanation, Demonstration, and Handouts Education comprehension: needs further education  HOME EXERCISE PROGRAM: Access Code: [email or web address] URL: [email or web address] Date: 07/25/2022 Prepared by: Shary Decamp  Exercises - Supine Hip Adduction Isometric with Ball  - 1 x daily - 7 x weekly - 3 sets - 10 reps - 2 sec hold - Hooklying Clamshell with Resistance  - 1 x daily - 7 x weekly - 3 sets - 10 reps - 2 sec hold - Supine Piriformis Stretch with Leg Straight  - 1 x daily - 7 x weekly - 3 sets - 10 deep breaths hold - Seated Piriformis Stretch  - 1 x daily - 7 x weekly - 3 sets - 10 deep breaths hold - Standing Hip Abduction with Resistance at Ankles and Counter Support  - 1 x daily - 7 x weekly - 2 sets - 10 reps - Standing Hip Extension with Resistance at Ankles and Counter Support  - 1 x daily - 7 x weekly - 2 sets - 10 reps    Below measures were taken at time of initial evaluation unless otherwise specified:   DIAGNOSTIC FINDINGS:   COGNITION: Overall cognitive status: Within functional limits for tasks assessed   SENSATION: WFL  COORDINATION: WNL  EDEMA:  none  MUSCLE TONE: WNL  MUSCLE LENGTH: Hamstring WNL  Piriformis tight/restricted R > L  PALPATION: Right piriformis muscle belly/sciatic notch very tender to palpation, trigger point appreciated   POSTURE: No Significant postural limitations  LOWER EXTREMITY ROM:     WFL  LOWER EXTREMITY MMT:    MMT Right Eval Left Eval  Hip flexion 3 4  Hip extension    Hip abduction 3+ 3+  Hip adduction 3 3  Hip internal rotation 3- 3+  Hip external rotation 3 (painful to resistance) 4  Knee  flexion 3+ 4  Knee extension 4 5  Ankle dorsiflexion 5 5  Ankle plantarflexion    Ankle inversion    Ankle eversion    (Blank rows = not tested)  BED MOBILITY:  Modified indep, increased time  TRANSFERS: Assistive device utilized: None  Sit to stand: Complete Independence and Modified independence Stand to sit: Complete Independence Chair to chair: Complete Independence and Modified independence Floor:  NT     GAIT: Gait pattern: antalgic Distance walked:  Assistive device utilized: None Level of assistance: Complete Independence and Modified independence Comments: incr time  FUNCTIONAL TESTS:  5 times sit to stand: 35 sec 10 meter walk test: NT Single leg stance: able to lift leg and maintain balance, 1-2 sec       GOALS: Goals reviewed with patient? Yes  SHORT TERM GOALS: Target date: 08/15/2022    Patient will be independent in HEP to improve functional outcomes Baseline: reports understanding 08/13/22 Goal status: MET 08/13/22  2.  Report pain in right buttocks not exceeding 4/10 with gait and exercise to improve activity tolerance Baseline: 7/10; reports pain usually increases after these activities but the worst at night 08/13/22 Goal status: IN PROGRESS 08/13/22    LONG TERM GOALS: Target date: 09/05/2022    Improve BLE strength and balance per time of 20 sec 5xSTS Baseline: 35 sec Goal status: IN PROGRESS  2.  Pt to report right buttock pain not exceeding 2/10 with activity and sleep to improve gait tolerance and comfort Baseline: 7/10 Goal status: IN PROGRESS  3.  Demo ability to maintain 2.62 ft/sec ambulation without increase in right buttock pain to improve community ambulation Baseline: 1.29 ft/sec 08/13/22 Goal status: IN PROGRESS 08/13/22  4.  Improve single limb support/strength as evidenced by ability to maintain single limb stance x 10 sec Baseline: unable Goal status: IN PROGRESS    ASSESSMENT:  CLINICAL IMPRESSION: Pt reports  the right buttock pain has significantly improved and is less irritable. Continued with training to increase hip strength and flexibility to improve symptoms with use of resistance for hip strengthening followed by assisted stretching to isolate right piriformis which was done to good effect. Finished with training in interval training to improve metabolic conditioning and instruction in fitness facility performance. Tolerated treatment without adverse effects. Continues to feel right hip/buttock discomfort with end-range stretching. Continued sessions to advance POC details and meet STG/LTG  OBJECTIVE IMPAIRMENTS: Abnormal gait, decreased activity tolerance, decreased balance, decreased mobility, difficulty walking, decreased strength, impaired perceived functional ability, increased muscle spasms, and pain.   ACTIVITY LIMITATIONS: carrying, sitting, squatting, stairs, transfers, and locomotion level  PARTICIPATION LIMITATIONS: cleaning, laundry, driving, shopping, community activity, and fitness  PERSONAL FACTORS: Age, Time since onset of injury/illness/exacerbation, and 1-2 comorbidities: obesity, right TKR  are also affecting patient's functional outcome.   REHAB POTENTIAL: Excellent  CLINICAL DECISION MAKING: Evolving/moderate complexity  EVALUATION COMPLEXITY: Moderate  PLAN:  PT FREQUENCY: 1-2x/week  PT DURATION: 6 weeks  PLANNED INTERVENTIONS: Therapeutic exercises, Therapeutic activity, Neuromuscular  re-education, Balance training, Gait training, Patient/Family education, Self Care, Joint mobilization, Stair training, Vestibular training, Canalith repositioning, Orthotic/Fit training, DME instructions, Aquatic Therapy, Dry Needling, Electrical stimulation, Spinal mobilization, Cryotherapy, Moist heat, Taping, Ultrasound, Ionotophoresis /ml Dexamethasone, and Manual therapy  PLAN FOR NEXT SESSION: hip strengthening, balance, HEP review    2:45 PM, 08/20/22 M. Shary Decamp, PT,  DPT Physical Therapist- [place] Office Number: [phone]

## 2022-08-21 NOTE — Therapy (Signed)
OUTPATIENT PHYSICAL THERAPY NEURO TREATMENT   Patient Name: [name] MRN: [id number] DOB:09-23-1945, 77 y.o., female Today's Date: 08/22/2022   PCP: Garlan Fillers, MD REFERRING PROVIDER: Olevia Perches, NP  END OF SESSION:  PT End of Session - 08/22/22 1430     Visit Number 6    Number of Visits 12    Date for PT Re-Evaluation 09/05/22    Authorization Type [employer]    Progress Note Due on Visit 10    PT Start Time 1403    PT Stop Time 1442    PT Time Calculation (min) 39 min    Equipment Utilized During Treatment Gait belt    Activity Tolerance Patient limited by pain    Behavior During Therapy WFL for tasks assessed/performed                 Past Medical History:  Diagnosis Date   Acute blood loss as cause of postoperative anemia 04/03/2016   Adenomatous colon polyp    Anemia    hx of   Anxiety    takes Xanax daily as needed   Arthritis    Bronchitis    a month ago   Chronic back pain    stenosis   Chronic kidney disease    stage 3-sees Dr.J Patel   Class 3 obesity due to excess calories with body mass index (BMI) of 40.0 to 44.9 in adult    Diabetes mellitus    type II; takes Byetta and Invokana daily   Diverticulosis    Glaucoma    uses Eye Drops   Gout    takes Uloric daily   Hepatic steatosis    History of blood transfusion    no abnormal reaction    History of colon polyps    Hyperlipidemia    no meds taken now   Hypertension    takes HCTZ daily as well as Aldactone   Hypothyroidism    takes Synthroid daily   Pneumonia    many yrs ago   PONV (postoperative nausea and vomiting)    Primary localized osteoarthritis of right knee    Stress incontinence    Syncope    Venous stenosis of right upper extremity    Weakness    numbness and tingling in right hip/eg/   Wears glasses    Wolf-Parkinson-White syndrome    takes Atenolol daily   Past Surgical History:  Procedure Laterality Date   ABDOMINAL  HYSTERECTOMY     APPENDECTOMY     BACK SURGERY  2006   lumb fusion   BACK SURGERY  08/16/2014   L3-4 fusion   BREAST BIOPSY     BREAST ENHANCEMENT SURGERY Bilateral    BREAST EXCISIONAL BIOPSY Left    benign   CARDIAC CATHETERIZATION  >75yrs ago   CARPAL TUNNEL RELEASE Bilateral    x4   cataract surgery Bilateral    CESAREAN SECTION     x 3   CHOLECYSTECTOMY  1981   COLONOSCOPY     IR ANGIO EXTERNAL CAROTID SEL EXT CAROTID UNI R MOD SED  06/12/2022   IR ANGIO INTRA EXTRACRAN SEL COM CAROTID INNOMINATE UNI L MOD SED  06/12/2022   IR ANGIO INTRA EXTRACRAN SEL INTERNAL CAROTID UNI R MOD SED  06/12/2022   IR ANGIO VERTEBRAL SEL VERTEBRAL BILAT MOD SED  06/12/2022   KNEE ARTHROSCOPY Right 2009   rt   RADIOLOGY WITH ANESTHESIA N/A 05/12/2020   Procedure: MRI LUMBAR SPINE  WITH AND WITHOUT CONTRAST;  Surgeon: Radiologist, Medication, MD;  Location: [facility] OR;  Service: Radiology;  Laterality: N/A;   RAZ procedure  10+yrs ago   REDUCTION MAMMAPLASTY Bilateral 2006   ROTATOR CUFF REPAIR Right    x 4   SHOULDER ARTHROSCOPY WITH ROTATOR CUFF REPAIR AND SUBACROMIAL DECOMPRESSION Right 06/10/2012   Procedure: SHOULDER ARTHROSCOPY WITH ROTATOR CUFF REPAIR AND SUBACROMIAL DECOMPRESSION;  Surgeon: Nilda Simmer, MD;  Location: [facility];  Service: Orthopedics;  Laterality: Right;  RIGHT SHOULDER ARTHROSCOPY, DECOMPRESSION SUBACROMIAL PARTIAL ACROMIOPLASTY WITH CORACOACROMIAL RELEASE,, DISTAL CLAVICULECTOMY , ROTATOR CUFF debriedment   SKIN BIOPSY     removed moles all over body   THYROID SURGERY Right    right thyroid removed   TOTAL KNEE ARTHROPLASTY Right 04/02/2016   Procedure: RIGHT TOTAL KNEE ARTHROPLASTY;  Surgeon: Salvatore Marvel, MD;  Location: [facility] OR;  Service: Orthopedics;  Laterality: Right;   TRANSFORAMINAL LUMBAR INTERBODY FUSION (TLIF) WITH PEDICLE SCREW FIXATION 1 LEVEL Left 08/16/2020   Procedure: Left Lumbar two-three Transforaminal lumbar interbody fusion with exploration  and extension of adjacent level fusion;  Surgeon: Maeola Harman, MD;  Location[facility] OR;  Service: Neurosurgery;  Laterality: Left;   Patient Active Problem List   Diagnosis Date Noted   Migraine without status migrainosus, not intractable 02/26/2022   Stroke-like symptom In the setting of illness (vomiting, diarrhea, 3 vaccinations) 02/26/2022   Degenerative lumbar spinal stenosis 08/16/2020   Acute blood loss as cause of postoperative anemia 04/03/2016   Class 3 obesity due to excess calories with body mass index (BMI) of 40.0 to 44.9 in adult    Primary localized osteoarthritis of right knee    CKD (chronic kidney disease), stage III 10/22/2015   Syncope 10/21/2015   Non-insulin dependent type 2 diabetes mellitus 10/21/2015   Hypothyroidism 10/21/2015   Lumbar stenosis with neurogenic claudication 08/16/2014   Right rotator cuff tear    Anxiety    PONV (postoperative nausea and vomiting)    GERD (gastroesophageal reflux disease)    Arthritis    Thyroid disease    DIVERTICULOSIS-COLON 07/04/2009   Iron deficiency anemia 03/17/2009   PERSONAL HX COLONIC POLYPS 03/17/2009   SKIN RASH 02/23/2009   DIARRHEA 02/23/2009   FEVER, HX OF 02/23/2009   THYROID NODULE, LEFT 02/11/2009   Glaucoma 02/11/2009   Coronary atherosclerosis 02/11/2009   WOLFF (WOLFE)-PARKINSON-WHITE (WPW) SYNDROME 02/11/2009   DEGENERATIVE JOINT DISEASE, ANKLE 02/11/2009   Osteoarthritis of spine 02/11/2009   Essential hypertension 02/09/2009    ONSET DATE: 1-2 months  REFERRING DIAG: R26.81 (ICD-10-CM) - Unsteadiness on feet  THERAPY DIAG:  Unsteadiness on feet  Muscle weakness (generalized)  Difficulty walking  Other low back pain  Rationale for Evaluation and Treatment: Rehabilitation  SUBJECTIVE:  SUBJECTIVE  STATEMENT: Was feeling wonderful- had no pain on Monday. Pain started yesterday afternoon- not sure why. Requesting e-stim today.   Pt accompanied by: self  PERTINENT HISTORY: right TKR, lumbar fusion x 3  PAIN:  Are you having pain? Yes: NPRS scale: 6-7/10 Pain location: right LB Pain description: uncomfortable Aggravating factors: right sidelying Relieving factors: left sidelying, ice pack  PRECAUTIONS: None  WEIGHT BEARING RESTRICTIONS: No  FALLS: Has patient fallen in last 6 months? Yes. Number of falls 1  LIVING ENVIRONMENT: Lives with: lives alone Lives in: House/apartment Stairs: Yes: External: 2 steps; bilateral but cannot reach both Has following equipment at home: Single point cane, Walker - 4 wheeled, shower chair, and Grab bars  PLOF: Independent and Independent with household mobility without device  PATIENT GOALS: reduce pain and improve activity  OBJECTIVE:      TODAY'S TREATMENT: 08/22/22 Activity Comments  STM and manual TPR, tennis ball, gentle IASTM to R glutes and piriformis  Very tight and TTP; areas of larger trigger pts throughout  LTR 20x Limited ROM, to tolerance   Hooklying clams for ROM 15x Limited ROM, to tolerance   E-stim IFC to R buttock 80-150 hz, amplitude to tolerance, 10 min also with MHP to R LB  Pt tolerated well. No skin changes            PATIENT EDUCATION: Education details: edu on personal TENS unit benefits, safety info; encouraged to speak to her MD about continued pain levels  Person educated: Patient Education method: Explanation, Demonstration, Tactile cues, Verbal cues, and Handouts Education comprehension: verbalized understanding   HOME EXERCISE PROGRAM: Access Code: [email or web address] URL: [email or web address] Date: 07/25/2022 Prepared by: Shary Decamp  Exercises - Supine Hip Adduction Isometric with Ball  - 1 x daily - 7 x weekly - 3 sets - 10 reps - 2 sec hold - Hooklying Clamshell with Resistance  -  1 x daily - 7 x weekly - 3 sets - 10 reps - 2 sec hold - Supine Piriformis Stretch with Leg Straight  - 1 x daily - 7 x weekly - 3 sets - 10 deep breaths hold - Seated Piriformis Stretch  - 1 x daily - 7 x weekly - 3 sets - 10 deep breaths hold - Standing Hip Abduction with Resistance at Ankles and Counter Support  - 1 x daily - 7 x weekly - 2 sets - 10 reps - Standing Hip Extension with Resistance at Ankles and Counter Support  - 1 x daily - 7 x weekly - 2 sets - 10 reps    Below measures were taken at time of initial evaluation unless otherwise specified:   DIAGNOSTIC FINDINGS:   COGNITION: Overall cognitive status: Within functional limits for tasks assessed   SENSATION: WFL  COORDINATION: WNL  EDEMA:  none  MUSCLE TONE: WNL  MUSCLE LENGTH: Hamstring WNL Piriformis tight/restricted R > L  PALPATION: Right piriformis muscle belly/sciatic notch very tender to palpation, trigger point appreciated   POSTURE: No Significant postural limitations  LOWER EXTREMITY ROM:     WFL  LOWER EXTREMITY MMT:    MMT Right Eval Left Eval  Hip flexion 3 4  Hip extension    Hip abduction 3+ 3+  Hip adduction 3 3  Hip internal rotation 3- 3+  Hip external rotation 3 (painful to resistance) 4  Knee flexion 3+ 4  Knee extension 4 5  Ankle dorsiflexion 5 5  Ankle plantarflexion    Ankle inversion    Ankle  eversion    (Blank rows = not tested)  BED MOBILITY:  Modified indep, increased time  TRANSFERS: Assistive device utilized: None  Sit to stand: Complete Independence and Modified independence Stand to sit: Complete Independence Chair to chair: Complete Independence and Modified independence Floor:  NT     GAIT: Gait pattern: antalgic Distance walked:  Assistive device utilized: None Level of assistance: Complete Independence and Modified independence Comments: incr time  FUNCTIONAL TESTS:  5 times sit to stand: 35 sec 10 meter walk test: NT Single leg  stance: able to lift leg and maintain balance, 1-2 sec       GOALS: Goals reviewed with patient? Yes  SHORT TERM GOALS: Target date: 08/15/2022    Patient will be independent in HEP to improve functional outcomes Baseline: reports understanding 08/13/22 Goal status: MET 08/13/22  2.  Report pain in right buttocks not exceeding 4/10 with gait and exercise to improve activity tolerance Baseline: 7/10; reports pain usually increases after these activities but the worst at night 08/13/22 Goal status: IN PROGRESS 08/13/22    LONG TERM GOALS: Target date: 09/05/2022    Improve BLE strength and balance per time of 20 sec 5xSTS Baseline: 35 sec Goal status: IN PROGRESS  2.  Pt to report right buttock pain not exceeding 2/10 with activity and sleep to improve gait tolerance and comfort Baseline: 7/10 Goal status: IN PROGRESS  3.  Demo ability to maintain 2.62 ft/sec ambulation without increase in right buttock pain to improve community ambulation Baseline: 1.29 ft/sec 08/13/22 Goal status: IN PROGRESS 08/13/22  4.  Improve single limb support/strength as evidenced by ability to maintain single limb stance x 10 sec Baseline: unable Goal status: IN PROGRESS    ASSESSMENT:  CLINICAL IMPRESSION: Patient arrived to session with report of increase in R LB/buttock pain starting Tuesday afternoon. Requesting e-stim today d/t pain levels. Patient tolerated light MT to address significant soft tissue restriction in glutes and piriformis. Patient was given edu on TENS unit for pain management at home. Tolerated e-stim to the R buttock well and noted some mild improvement in pain upon leaving.   OBJECTIVE IMPAIRMENTS: Abnormal gait, decreased activity tolerance, decreased balance, decreased mobility, difficulty walking, decreased strength, impaired perceived functional ability, increased muscle spasms, and pain.   ACTIVITY LIMITATIONS: carrying, sitting, squatting, stairs, transfers, and  locomotion level  PARTICIPATION LIMITATIONS: cleaning, laundry, driving, shopping, community activity, and fitness  PERSONAL FACTORS: Age, Time since onset of injury/illness/exacerbation, and 1-2 comorbidities: obesity, right TKR  are also affecting patient's functional outcome.   REHAB POTENTIAL: Excellent  CLINICAL DECISION MAKING: Evolving/moderate complexity  EVALUATION COMPLEXITY: Moderate  PLAN:  PT FREQUENCY: 1-2x/week  PT DURATION: 6 weeks  PLANNED INTERVENTIONS: Therapeutic exercises, Therapeutic activity, Neuromuscular re-education, Balance training, Gait training, Patient/Family education, Self Care, Joint mobilization, Stair training, Vestibular training, Canalith repositioning, Orthotic/Fit training, DME instructions, Aquatic Therapy, Dry Needling, Electrical stimulation, Spinal mobilization, Cryotherapy, Moist heat, Taping, Ultrasound, Ionotophoresis 4mg /ml Dexamethasone, and Manual therapy  PLAN FOR NEXT SESSION: hip strengthening, balance, HEP review    Anette Guarneri, PT, DPT 08/22/22 2:47 PM  [facility] [place] [address], [address] [place], [place] [address] Phone # [phone] Fax # [phone]

## 2022-08-22 ENCOUNTER — Encounter: Payer: Self-pay | Admitting: Physical Therapy

## 2022-08-22 ENCOUNTER — Ambulatory Visit: Payer: Medicare Other | Admitting: Physical Therapy

## 2022-08-22 DIAGNOSIS — R2681 Unsteadiness on feet: Secondary | ICD-10-CM

## 2022-08-22 DIAGNOSIS — M5459 Other low back pain: Secondary | ICD-10-CM

## 2022-08-22 DIAGNOSIS — R262 Difficulty in walking, not elsewhere classified: Secondary | ICD-10-CM

## 2022-08-22 DIAGNOSIS — M6281 Muscle weakness (generalized): Secondary | ICD-10-CM

## 2022-08-22 NOTE — Patient Instructions (Signed)

## 2022-08-24 ENCOUNTER — Encounter: Payer: Self-pay | Admitting: Cardiology

## 2022-08-24 ENCOUNTER — Ambulatory Visit: Payer: Medicare Other | Attending: Cardiology | Admitting: Cardiology

## 2022-08-24 VITALS — BP 106/64 | HR 70 | Ht 63.0 in | Wt 182.6 lb

## 2022-08-24 DIAGNOSIS — I2510 Atherosclerotic heart disease of native coronary artery without angina pectoris: Secondary | ICD-10-CM

## 2022-08-24 DIAGNOSIS — E785 Hyperlipidemia, unspecified: Secondary | ICD-10-CM | POA: Diagnosis not present

## 2022-08-24 DIAGNOSIS — I456 Pre-excitation syndrome: Secondary | ICD-10-CM

## 2022-08-24 DIAGNOSIS — I10 Essential (primary) hypertension: Secondary | ICD-10-CM | POA: Diagnosis not present

## 2022-08-24 NOTE — Patient Instructions (Signed)
  Follow-Up: At  [facility], you and your health needs are our priority.  As part of our continuing mission to provide you with exceptional heart care, we have created designated Provider Care Teams.  These Care Teams include your primary Cardiologist (physician) and Advanced Practice Providers (APPs -  Physician Assistants and Nurse Practitioners) who all work together to provide you with the care you need, when you need it.  We recommend signing up for the patient portal called "MyChart".  Sign up information is provided on this After Visit Summary.  MyChart is used to connect with patients for Virtual Visits (Telemedicine).  Patients are able to view lab/test results, encounter notes, upcoming appointments, etc.  Non-urgent messages can be sent to your provider as well.   To learn more about what you can do with MyChart, go to [email or web address].    Your next appointment:   12 month(s)  Provider:   Brian Crenshaw MD    

## 2022-08-27 ENCOUNTER — Ambulatory Visit (HOSPITAL_BASED_OUTPATIENT_CLINIC_OR_DEPARTMENT_OTHER): Payer: Medicare Other | Attending: Family Medicine | Admitting: Physical Therapy

## 2022-08-27 ENCOUNTER — Encounter (HOSPITAL_BASED_OUTPATIENT_CLINIC_OR_DEPARTMENT_OTHER): Payer: Self-pay | Admitting: Physical Therapy

## 2022-08-27 DIAGNOSIS — R2681 Unsteadiness on feet: Secondary | ICD-10-CM | POA: Insufficient documentation

## 2022-08-27 DIAGNOSIS — R262 Difficulty in walking, not elsewhere classified: Secondary | ICD-10-CM | POA: Insufficient documentation

## 2022-08-27 DIAGNOSIS — M6281 Muscle weakness (generalized): Secondary | ICD-10-CM | POA: Diagnosis present

## 2022-08-27 NOTE — Therapy (Signed)
OUTPATIENT PHYSICAL THERAPY NEURO TREATMENT   Patient Name: [name] MRN: [id number] DOB:1945-10-30, 77 y.o., female Today's Date: 08/27/2022   PCP: Garlan Fillers, MD REFERRING PROVIDER: Olevia Perches, NP  END OF SESSION:  PT End of Session - 08/27/22 1209     Visit Number 7    Number of Visits 12    Date for PT Re-Evaluation 09/05/22    Authorization Type [employer]    Progress Note Due on Visit 10    PT Start Time 1202    PT Stop Time 1245    PT Time Calculation (min) 43 min    Equipment Utilized During Treatment Gait belt    Activity Tolerance Patient limited by pain    Behavior During Therapy WFL for tasks assessed/performed                 Past Medical History:  Diagnosis Date   Acute blood loss as cause of postoperative anemia 04/03/2016   Adenomatous colon polyp    Anemia    hx of   Anxiety    takes Xanax daily as needed   Arthritis    Bronchitis    a month ago   Chronic back pain    stenosis   Chronic kidney disease    stage 3-sees Dr.J Patel   Class 3 obesity due to excess calories with body mass index (BMI) of 40.0 to 44.9 in adult    Diabetes mellitus    type II; takes Byetta and Invokana daily   Diverticulosis    Glaucoma    uses Eye Drops   Gout    takes Uloric daily   Hepatic steatosis    History of blood transfusion    no abnormal reaction    History of colon polyps    Hyperlipidemia    no meds taken now   Hypertension    takes HCTZ daily as well as Aldactone   Hypothyroidism    takes Synthroid daily   Pneumonia    many yrs ago   PONV (postoperative nausea and vomiting)    Primary localized osteoarthritis of right knee    Stress incontinence    Syncope    Venous stenosis of right upper extremity    Weakness    numbness and tingling in right hip/eg/   Wears glasses    Wolf-Parkinson-White syndrome    takes Atenolol daily   Past Surgical History:  Procedure Laterality Date   ABDOMINAL  HYSTERECTOMY     APPENDECTOMY     BACK SURGERY  2006   lumb fusion   BACK SURGERY  08/16/2014   L3-4 fusion   BREAST BIOPSY     BREAST ENHANCEMENT SURGERY Bilateral    BREAST EXCISIONAL BIOPSY Left    benign   CARDIAC CATHETERIZATION  >68yrs ago   CARPAL TUNNEL RELEASE Bilateral    x4   cataract surgery Bilateral    CESAREAN SECTION     x 3   CHOLECYSTECTOMY  1981   COLONOSCOPY     IR ANGIO EXTERNAL CAROTID SEL EXT CAROTID UNI R MOD SED  06/12/2022   IR ANGIO INTRA EXTRACRAN SEL COM CAROTID INNOMINATE UNI L MOD SED  06/12/2022   IR ANGIO INTRA EXTRACRAN SEL INTERNAL CAROTID UNI R MOD SED  06/12/2022   IR ANGIO VERTEBRAL SEL VERTEBRAL BILAT MOD SED  06/12/2022   KNEE ARTHROSCOPY Right 2009   rt   RADIOLOGY WITH ANESTHESIA N/A 05/12/2020   Procedure: MRI LUMBAR SPINE  WITH AND WITHOUT CONTRAST;  Surgeon: Radiologist, Medication, MD;  Location: [facility] OR;  Service: Radiology;  Laterality: N/A;   RAZ procedure  10+yrs ago   REDUCTION MAMMAPLASTY Bilateral 2006   ROTATOR CUFF REPAIR Right    x 4   SHOULDER ARTHROSCOPY WITH ROTATOR CUFF REPAIR AND SUBACROMIAL DECOMPRESSION Right 06/10/2012   Procedure: SHOULDER ARTHROSCOPY WITH ROTATOR CUFF REPAIR AND SUBACROMIAL DECOMPRESSION;  Surgeon: Nilda Simmer, MD;  Location: [facility];  Service: Orthopedics;  Laterality: Right;  RIGHT SHOULDER ARTHROSCOPY, DECOMPRESSION SUBACROMIAL PARTIAL ACROMIOPLASTY WITH CORACOACROMIAL RELEASE,, DISTAL CLAVICULECTOMY , ROTATOR CUFF debriedment   SKIN BIOPSY     removed moles all over body   THYROID SURGERY Right    right thyroid removed   TOTAL KNEE ARTHROPLASTY Right 04/02/2016   Procedure: RIGHT TOTAL KNEE ARTHROPLASTY;  Surgeon: Salvatore Marvel, MD;  Location: [facility] OR;  Service: Orthopedics;  Laterality: Right;   TRANSFORAMINAL LUMBAR INTERBODY FUSION (TLIF) WITH PEDICLE SCREW FIXATION 1 LEVEL Left 08/16/2020   Procedure: Left Lumbar two-three Transforaminal lumbar interbody fusion with exploration  and extension of adjacent level fusion;  Surgeon: Maeola Harman, MD;  Location: [facility] OR;  Service: Neurosurgery;  Laterality: Left;   Patient Active Problem List   Diagnosis Date Noted   Migraine without status migrainosus, not intractable 02/26/2022   Stroke-like symptom In the setting of illness (vomiting, diarrhea, 3 vaccinations) 02/26/2022   Degenerative lumbar spinal stenosis 08/16/2020   Acute blood loss as cause of postoperative anemia 04/03/2016   Class 3 obesity due to excess calories with body mass index (BMI) of 40.0 to 44.9 in adult    Primary localized osteoarthritis of right knee    CKD (chronic kidney disease), stage III (HCC) 10/22/2015   Syncope 10/21/2015   Non-insulin dependent type 2 diabetes mellitus (HCC) 10/21/2015   Hypothyroidism 10/21/2015   Lumbar stenosis with neurogenic claudication 08/16/2014   Right rotator cuff tear    Anxiety    PONV (postoperative nausea and vomiting)    GERD (gastroesophageal reflux disease)    Arthritis    Thyroid disease    DIVERTICULOSIS-COLON 07/04/2009   Iron deficiency anemia 03/17/2009   PERSONAL HX COLONIC POLYPS 03/17/2009   SKIN RASH 02/23/2009   DIARRHEA 02/23/2009   FEVER, HX OF 02/23/2009   THYROID NODULE, LEFT 02/11/2009   Glaucoma 02/11/2009   Coronary atherosclerosis 02/11/2009   WOLFF (WOLFE)-PARKINSON-WHITE (WPW) SYNDROME 02/11/2009   DEGENERATIVE JOINT DISEASE, ANKLE 02/11/2009   Osteoarthritis of spine 02/11/2009   Essential hypertension 02/09/2009    ONSET DATE: 1-2 months  REFERRING DIAG: R26.81 (ICD-10-CM) - Unsteadiness on feet  THERAPY DIAG:  Unsteadiness on feet  Muscle weakness (generalized)  Difficulty walking  Rationale for Evaluation and Treatment: Rehabilitation  SUBJECTIVE:  SUBJECTIVE  STATEMENT: "Right hip hurting, pool feels great."  Pt accompanied by: self  PERTINENT HISTORY: right TKR, lumbar fusion x 3  PAIN:  Are you having pain? Yes: NPRS scale: 6-7/10 Pain location: right LB Pain description: uncomfortable Aggravating factors: right sidelying Relieving factors: left sidelying, ice pack  PRECAUTIONS: None  WEIGHT BEARING RESTRICTIONS: No  FALLS: Has patient fallen in last 6 months? Yes. Number of falls 1  LIVING ENVIRONMENT: Lives with: lives alone Lives in: House/apartment Stairs: Yes: External: 2 steps; bilateral but cannot reach both Has following equipment at home: Single point cane, Walker - 4 wheeled, shower chair, and Grab bars  PLOF: Independent and Independent with household mobility without device  PATIENT GOALS: reduce pain and improve activity  OBJECTIVE:   TODAY'S TREATMENT: 08/27/22 Pt seen for aquatic therapy today.  Treatment took place in water 3.5-4.75 ft in depth at the [facility] pool. Temp of water was 91.  Pt entered/exited the pool via stairs and step to pattern with hand rail.  *walking all direction [place] support barbell *Cycling on yellow noodle.  Difficulty coordinating *Straddling noodle ue support on yellow HB: squats; df; pf *L stretch *Ue support wall 3.6 ft: 1/2 diamonds *Seated on lift: hip abd/add; cycling; flutter kicking *Noodle pull down for core engagement: wide stance then staggered x 10 ea position. *Gentle lumbar ROM  Pt requires the buoyancy and hydrostatic pressure of water for support, and to offload joints by unweighting joint load by at least 50 % in navel deep water and by at least 75-80% in chest to neck deep water.  Viscosity of the water is needed for resistance of strengthening. Water current perturbations provides challenge to standing balance requiring increased core activation.     TODAY'S TREATMENT: 08/22/22 Activity Comments  STM and manual TPR, tennis ball, gentle IASTM to R  glutes and piriformis  Very tight and TTP; areas of larger trigger pts throughout  LTR 20x Limited ROM, to tolerance   Hooklying clams for ROM 15x Limited ROM, to tolerance   E-stim IFC to R buttock 80-150 hz, amplitude to tolerance, 10 min also with MHP to R LB  Pt tolerated well. No skin changes            PATIENT EDUCATION: Education details: edu on personal TENS unit benefits, safety info; encouraged to speak to her MD about continued pain levels  Person educated: Patient Education method: Explanation, Demonstration, Tactile cues, Verbal cues, and Handouts Education comprehension: verbalized understanding   HOME EXERCISE PROGRAM: Access Code: [email or web address] URL: [email or web address] Date: 07/25/2022 Prepared by: Shary Decamp  Exercises - Supine Hip Adduction Isometric with Ball  - 1 x daily - 7 x weekly - 3 sets - 10 reps - 2 sec hold - Hooklying Clamshell with Resistance  - 1 x daily - 7 x weekly - 3 sets - 10 reps - 2 sec hold - Supine Piriformis Stretch with Leg Straight  - 1 x daily - 7 x weekly - 3 sets - 10 deep breaths hold - Seated Piriformis Stretch  - 1 x daily - 7 x weekly - 3 sets - 10 deep breaths hold - Standing Hip Abduction with Resistance at Ankles and Counter Support  - 1 x daily - 7 x weekly - 2 sets - 10 reps - Standing Hip Extension with Resistance at Ankles and Counter Support  - 1 x daily - 7 x weekly - 2 sets - 10 reps    Below measures were taken at  time of initial evaluation unless otherwise specified:   DIAGNOSTIC FINDINGS:   COGNITION: Overall cognitive status: Within functional limits for tasks assessed   SENSATION: WFL  COORDINATION: WNL  EDEMA:  none  MUSCLE TONE: WNL  MUSCLE LENGTH: Hamstring WNL Piriformis tight/restricted R > L  PALPATION: Right piriformis muscle belly/sciatic notch very tender to palpation, trigger point appreciated   POSTURE: No Significant postural limitations  LOWER EXTREMITY ROM:      WFL  LOWER EXTREMITY MMT:    MMT Right Eval Left Eval  Hip flexion 3 4  Hip extension    Hip abduction 3+ 3+  Hip adduction 3 3  Hip internal rotation 3- 3+  Hip external rotation 3 (painful to resistance) 4  Knee flexion 3+ 4  Knee extension 4 5  Ankle dorsiflexion 5 5  Ankle plantarflexion    Ankle inversion    Ankle eversion    (Blank rows = not tested)  BED MOBILITY:  Modified indep, increased time  TRANSFERS: Assistive device utilized: None  Sit to stand: Complete Independence and Modified independence Stand to sit: Complete Independence Chair to chair: Complete Independence and Modified independence Floor:  NT     GAIT: Gait pattern: antalgic Distance walked:  Assistive device utilized: None Level of assistance: Complete Independence and Modified independence Comments: incr time  FUNCTIONAL TESTS:  5 times sit to stand: 35 sec 10 meter walk test: NT Single leg stance: able to lift leg and maintain balance, 1-2 sec       GOALS: Goals reviewed with patient? Yes  SHORT TERM GOALS: Target date: 08/15/2022    Patient will be independent in HEP to improve functional outcomes Baseline: reports understanding 08/13/22 Goal status: MET 08/13/22  2.  Report pain in right buttocks not exceeding 4/10 with gait and exercise to improve activity tolerance Baseline: 7/10; reports pain usually increases after these activities but the worst at night 08/13/22 Goal status: IN PROGRESS 08/13/22    LONG TERM GOALS: Target date: 09/05/2022    Improve BLE strength and balance per time of 20 sec 5xSTS Baseline: 35 sec Goal status: IN PROGRESS  2.  Pt to report right buttock pain not exceeding 2/10 with activity and sleep to improve gait tolerance and comfort Baseline: 7/10 Goal status: IN PROGRESS  3.  Demo ability to maintain 2.62 ft/sec ambulation without increase in right buttock pain to improve community ambulation Baseline: 1.29 ft/sec 08/13/22 Goal  status: IN PROGRESS 08/13/22  4.  Improve single limb support/strength as evidenced by ability to maintain single limb stance x 10 sec Baseline: unable Goal status: IN PROGRESS    ASSESSMENT:  CLINICAL IMPRESSION: Pt initiates session with "lower " pain sensitivity 6/10.  She reports good response to last land session with reduction of pain after use of TENs unit. Will be ordering her own today.  She is well known to aquatics here and is safe and indep in setting.  She tolerates session focused on movement and core strengthening fairly well with complaints of right hip pain . Modification of positioning with exercises decreases discomfort. She ends in [place] for water massage to right buttock area (Unbilled). She is a good candidate for aquatic therapy intervention and will benefit form the properties of water to progress towards meeting all goals.  OBJECTIVE IMPAIRMENTS: Abnormal gait, decreased activity tolerance, decreased balance, decreased mobility, difficulty walking, decreased strength, impaired perceived functional ability, increased muscle spasms, and pain.   ACTIVITY LIMITATIONS: carrying, sitting, squatting, stairs, transfers, and locomotion level  PARTICIPATION LIMITATIONS: cleaning, laundry, driving, shopping, community activity, and fitness  PERSONAL FACTORS: Age, Time since onset of injury/illness/exacerbation, and 1-2 comorbidities: obesity, right TKR  are also affecting patient's functional outcome.   REHAB POTENTIAL: Excellent  CLINICAL DECISION MAKING: Evolving/moderate complexity  EVALUATION COMPLEXITY: Moderate  PLAN:  PT FREQUENCY: 1-2x/week  PT DURATION: 6 weeks  PLANNED INTERVENTIONS: Therapeutic exercises, Therapeutic activity, Neuromuscular re-education, Balance training, Gait training, Patient/Family education, Self Care, Joint mobilization, Stair training, Vestibular training, Canalith repositioning, Orthotic/Fit training, DME instructions, Aquatic  Therapy, Dry Needling, Electrical stimulation, Spinal mobilization, Cryotherapy, Moist heat, Taping, Ultrasound, Ionotophoresis 4mg /ml Dexamethasone, and Manual therapy  PLAN FOR NEXT SESSION: hip strengthening, balance, HEP review    Corrie Dandy Tomma Lightning) Emilliano Dilworth MPT 08/27/22 12:11 PM

## 2022-08-28 NOTE — Therapy (Signed)
OUTPATIENT PHYSICAL THERAPY NEURO TREATMENT   Patient Name: [name] MRN: [id number] DOB:Feb 05, 1946, 77 y.o., female Today's Date: 08/29/2022   PCP: Garlan Fillers, MD REFERRING PROVIDER: Olevia Perches, NP  END OF SESSION:  PT End of Session - 08/29/22 1441     Visit Number 8    Number of Visits 12    Date for PT Re-Evaluation 09/05/22    Authorization Type [employer]    Progress Note Due on Visit 10    PT Start Time 1359    PT Stop Time 1441    PT Time Calculation (min) 42 min    Equipment Utilized During Treatment Gait belt    Activity Tolerance Patient limited by pain    Behavior During Therapy WFL for tasks assessed/performed                  Past Medical History:  Diagnosis Date   Acute blood loss as cause of postoperative anemia 04/03/2016   Adenomatous colon polyp    Anemia    hx of   Anxiety    takes Xanax daily as needed   Arthritis    Bronchitis    a month ago   Chronic back pain    stenosis   Chronic kidney disease    stage 3-sees Dr.J Patel   Class 3 obesity due to excess calories with body mass index (BMI) of 40.0 to 44.9 in adult    Diabetes mellitus    type II; takes Byetta and Invokana daily   Diverticulosis    Glaucoma    uses Eye Drops   Gout    takes Uloric daily   Hepatic steatosis    History of blood transfusion    no abnormal reaction    History of colon polyps    Hyperlipidemia    no meds taken now   Hypertension    takes HCTZ daily as well as Aldactone   Hypothyroidism    takes Synthroid daily   Pneumonia    many yrs ago   PONV (postoperative nausea and vomiting)    Primary localized osteoarthritis of right knee    Stress incontinence    Syncope    Venous stenosis of right upper extremity    Weakness    numbness and tingling in right hip/eg/   Wears glasses    Wolf-Parkinson-White syndrome    takes Atenolol daily   Past Surgical History:  Procedure Laterality Date   ABDOMINAL  HYSTERECTOMY     APPENDECTOMY     BACK SURGERY  2006   lumb fusion   BACK SURGERY  08/16/2014   L3-4 fusion   BREAST BIOPSY     BREAST ENHANCEMENT SURGERY Bilateral    BREAST EXCISIONAL BIOPSY Left    benign   CARDIAC CATHETERIZATION  >59yrs ago   CARPAL TUNNEL RELEASE Bilateral    x4   cataract surgery Bilateral    CESAREAN SECTION     x 3   CHOLECYSTECTOMY  1981   COLONOSCOPY     IR ANGIO EXTERNAL CAROTID SEL EXT CAROTID UNI R MOD SED  06/12/2022   IR ANGIO INTRA EXTRACRAN SEL COM CAROTID INNOMINATE UNI L MOD SED  06/12/2022   IR ANGIO INTRA EXTRACRAN SEL INTERNAL CAROTID UNI R MOD SED  06/12/2022   IR ANGIO VERTEBRAL SEL VERTEBRAL BILAT MOD SED  06/12/2022   KNEE ARTHROSCOPY Right 2009   rt   RADIOLOGY WITH ANESTHESIA N/A 05/12/2020   Procedure: MRI LUMBAR  SPINE WITH AND WITHOUT CONTRAST;  Surgeon: Radiologist, Medication, MD;  Location: [facility] OR;  Service: Radiology;  Laterality: N/A;   RAZ procedure  10+yrs ago   REDUCTION MAMMAPLASTY Bilateral 2006   ROTATOR CUFF REPAIR Right    x 4   SHOULDER ARTHROSCOPY WITH ROTATOR CUFF REPAIR AND SUBACROMIAL DECOMPRESSION Right 06/10/2012   Procedure: SHOULDER ARTHROSCOPY WITH ROTATOR CUFF REPAIR AND SUBACROMIAL DECOMPRESSION;  Surgeon: Nilda Simmer, MD;  Location: [facility];  Service: Orthopedics;  Laterality: Right;  RIGHT SHOULDER ARTHROSCOPY, DECOMPRESSION SUBACROMIAL PARTIAL ACROMIOPLASTY WITH CORACOACROMIAL RELEASE,, DISTAL CLAVICULECTOMY , ROTATOR CUFF debriedment   SKIN BIOPSY     removed moles all over body   THYROID SURGERY Right    right thyroid removed   TOTAL KNEE ARTHROPLASTY Right 04/02/2016   Procedure: RIGHT TOTAL KNEE ARTHROPLASTY;  Surgeon: Salvatore Marvel, MD;  Location: [facility] OR;  Service: Orthopedics;  Laterality: Right;   TRANSFORAMINAL LUMBAR INTERBODY FUSION (TLIF) WITH PEDICLE SCREW FIXATION 1 LEVEL Left 08/16/2020   Procedure: Left Lumbar two-three Transforaminal lumbar interbody fusion with exploration  and extension of adjacent level fusion;  Surgeon: Maeola Harman, MD;  Location: [facility] OR;  Service: Neurosurgery;  Laterality: Left;   Patient Active Problem List   Diagnosis Date Noted   Migraine without status migrainosus, not intractable 02/26/2022   Stroke-like symptom In the setting of illness (vomiting, diarrhea, 3 vaccinations) 02/26/2022   Degenerative lumbar spinal stenosis 08/16/2020   Acute blood loss as cause of postoperative anemia 04/03/2016   Class 3 obesity due to excess calories with body mass index (BMI) of 40.0 to 44.9 in adult    Primary localized osteoarthritis of right knee    CKD (chronic kidney disease), stage III (HCC) 10/22/2015   Syncope 10/21/2015   Non-insulin dependent type 2 diabetes mellitus (HCC) 10/21/2015   Hypothyroidism 10/21/2015   Lumbar stenosis with neurogenic claudication 08/16/2014   Right rotator cuff tear    Anxiety    PONV (postoperative nausea and vomiting)    GERD (gastroesophageal reflux disease)    Arthritis    Thyroid disease    DIVERTICULOSIS-COLON 07/04/2009   Iron deficiency anemia 03/17/2009   PERSONAL HX COLONIC POLYPS 03/17/2009   SKIN RASH 02/23/2009   DIARRHEA 02/23/2009   FEVER, HX OF 02/23/2009   THYROID NODULE, LEFT 02/11/2009   Glaucoma 02/11/2009   Coronary atherosclerosis 02/11/2009   WOLFF (WOLFE)-PARKINSON-WHITE (WPW) SYNDROME 02/11/2009   DEGENERATIVE JOINT DISEASE, ANKLE 02/11/2009   Osteoarthritis of spine 02/11/2009   Essential hypertension 02/09/2009    ONSET DATE: 1-2 months  REFERRING DIAG: R26.81 (ICD-10-CM) - Unsteadiness on feet  THERAPY DIAG:  Unsteadiness on feet  Muscle weakness (generalized)  Difficulty walking  Other low back pain  Rationale for Evaluation and Treatment: Rehabilitation  SUBJECTIVE:  SUBJECTIVE STATEMENT: Feel pretty good today. Got a [facility]. Got a lot of relief from E-stim last weekend. L>R ankle edematous- patient reports that this is a chronic problem d/t needing a L ankle fusion.   Pt accompanied by: self  PERTINENT HISTORY: right TKR, lumbar fusion x 3  PAIN:  Are you having pain? Yes: NPRS scale: 1-2/10 Pain location: right LB Pain description: uncomfortable Aggravating factors: right sidelying Relieving factors: left sidelying, ice pack  PRECAUTIONS: None  WEIGHT BEARING RESTRICTIONS: No  FALLS: Has patient fallen in last 6 months? Yes. Number of falls 1  LIVING ENVIRONMENT: Lives with: lives alone Lives in: House/apartment Stairs: Yes: External: 2 steps; bilateral but cannot reach both Has following equipment at home: Single point cane, Walker - 4 wheeled, shower chair, and Grab bars  PLOF: Independent and Independent with household mobility without device  PATIENT GOALS: reduce pain and improve activity  OBJECTIVE:       TODAY'S TREATMENT: 08/29/22 Activity Comments  pulley: TB row 2x10 20# TB shoulder extension 2x10 15# paloff press 2x10 10# Cues to depress shoulders; adjustment of foot position for max stability, cueing to avoid forward trunk lean. Most difficulty with anti-rotation    standing hip ABD 2# 2x10 Good form   standing hip EX 2# 10x, 0# 10x  More difficulty on R; cues to avoid anterior lean; advised pt not to continue and push into pain if LB is exacerbated   mini squat 2x10 Good form  alt sidestepping on foam 10x Cueing for safe foot placement; required B UE support  backwards walking along counter  1 UE support     HOME EXERCISE PROGRAM: Access Code: [email or web address] URL: [email or web address] Date: 07/25/2022 Prepared by: Shary Decamp  Exercises - Supine Hip Adduction Isometric with Ball  - 1 x daily - 7 x weekly - 3 sets - 10 reps - 2 sec hold - Hooklying Clamshell with Resistance  - 1 x daily  - 7 x weekly - 3 sets - 10 reps - 2 sec hold - Supine Piriformis Stretch with Leg Straight  - 1 x daily - 7 x weekly - 3 sets - 10 deep breaths hold - Seated Piriformis Stretch  - 1 x daily - 7 x weekly - 3 sets - 10 deep breaths hold - Standing Hip Abduction with Resistance at Ankles and Counter Support  - 1 x daily - 7 x weekly - 2 sets - 10 reps - Standing Hip Extension with Resistance at Ankles and Counter Support  - 1 x daily - 7 x weekly - 2 sets - 10 reps    Below measures were taken at time of initial evaluation unless otherwise specified:   DIAGNOSTIC FINDINGS:   COGNITION: Overall cognitive status: Within functional limits for tasks assessed   SENSATION: WFL  COORDINATION: WNL  EDEMA:  none  MUSCLE TONE: WNL  MUSCLE LENGTH: Hamstring WNL Piriformis tight/restricted R > L  PALPATION: Right piriformis muscle belly/sciatic notch very tender to palpation, trigger point appreciated   POSTURE: No Significant postural limitations  LOWER EXTREMITY ROM:     WFL  LOWER EXTREMITY MMT:    MMT Right Eval Left Eval  Hip flexion 3 4  Hip extension    Hip abduction 3+ 3+  Hip adduction 3 3  Hip internal rotation 3- 3+  Hip external rotation 3 (painful to resistance) 4  Knee flexion 3+ 4  Knee extension 4 5  Ankle dorsiflexion 5 5  Ankle plantarflexion  Ankle inversion    Ankle eversion    (Blank rows = not tested)  BED MOBILITY:  Modified indep, increased time  TRANSFERS: Assistive device utilized: None  Sit to stand: Complete Independence and Modified independence Stand to sit: Complete Independence Chair to chair: Complete Independence and Modified independence Floor:  NT     GAIT: Gait pattern: antalgic Distance walked:  Assistive device utilized: None Level of assistance: Complete Independence and Modified independence Comments: incr time  FUNCTIONAL TESTS:  5 times sit to stand: 35 sec 10 meter walk test: NT Single leg stance: able  to lift leg and maintain balance, 1-2 sec       GOALS: Goals reviewed with patient? Yes  SHORT TERM GOALS: Target date: 08/15/2022    Patient will be independent in HEP to improve functional outcomes Baseline: reports understanding 08/13/22 Goal status: MET 08/13/22  2.  Report pain in right buttocks not exceeding 4/10 with gait and exercise to improve activity tolerance Baseline: 7/10; reports pain usually increases after these activities but the worst at night 08/13/22 Goal status: IN PROGRESS 08/13/22    LONG TERM GOALS: Target date: 09/05/2022    Improve BLE strength and balance per time of 20 sec 5xSTS Baseline: 35 sec Goal status: IN PROGRESS  2.  Pt to report right buttock pain not exceeding 2/10 with activity and sleep to improve gait tolerance and comfort Baseline: 7/10 Goal status: IN PROGRESS  3.  Demo ability to maintain 2.62 ft/sec ambulation without increase in right buttock pain to improve community ambulation Baseline: 1.29 ft/sec 08/13/22 Goal status: IN PROGRESS 08/13/22  4.  Improve single limb support/strength as evidenced by ability to maintain single limb stance x 10 sec Baseline: unable Goal status: IN PROGRESS    ASSESSMENT:  CLINICAL IMPRESSION: Patient arrived to session with report of improvement in LB pain levels. Worked on resisted core strengthening with pulley resistance. Patient required cues for proper foot position and posture. Most difficulty was evident with anti-rotation strengthening. Hip strengthening with weighted resistance revealed decreased amplitude on R side and patient reported some discomfort, thus removed ankle weights and continued. Balance activities required UE support d/t some hesitancy and instability. Patient reported LB feeling "worn out' but no pain upon leaving.   OBJECTIVE IMPAIRMENTS: Abnormal gait, decreased activity tolerance, decreased balance, decreased mobility, difficulty walking, decreased strength, impaired  perceived functional ability, increased muscle spasms, and pain.   ACTIVITY LIMITATIONS: carrying, sitting, squatting, stairs, transfers, and locomotion level  PARTICIPATION LIMITATIONS: cleaning, laundry, driving, shopping, community activity, and fitness  PERSONAL FACTORS: Age, Time since onset of injury/illness/exacerbation, and 1-2 comorbidities: obesity, right TKR  are also affecting patient's functional outcome.   REHAB POTENTIAL: Excellent  CLINICAL DECISION MAKING: Evolving/moderate complexity  EVALUATION COMPLEXITY: Moderate  PLAN:  PT FREQUENCY: 1-2x/week  PT DURATION: 6 weeks  PLANNED INTERVENTIONS: Therapeutic exercises, Therapeutic activity, Neuromuscular re-education, Balance training, Gait training, Patient/Family education, Self Care, Joint mobilization, Stair training, Vestibular training, Canalith repositioning, Orthotic/Fit training, DME instructions, Aquatic Therapy, Dry Needling, Electrical stimulation, Spinal mobilization, Cryotherapy, Moist heat, Taping, Ultrasound, Ionotophoresis 4mg /ml Dexamethasone, and Manual therapy  PLAN FOR NEXT SESSION: hip strengthening, balance, HEP review   Anette Guarneri, PT, DPT 08/29/22 2:43 PM  [facility] [address] [place], [place] [address] Phone # [phone] Fax # [phone]

## 2022-08-29 ENCOUNTER — Ambulatory Visit: Payer: Medicare Other | Attending: Family Medicine | Admitting: Physical Therapy

## 2022-08-29 ENCOUNTER — Encounter: Payer: Self-pay | Admitting: Physical Therapy

## 2022-08-29 DIAGNOSIS — R2681 Unsteadiness on feet: Secondary | ICD-10-CM | POA: Diagnosis present

## 2022-08-29 DIAGNOSIS — M5459 Other low back pain: Secondary | ICD-10-CM | POA: Diagnosis present

## 2022-08-29 DIAGNOSIS — R262 Difficulty in walking, not elsewhere classified: Secondary | ICD-10-CM | POA: Diagnosis present

## 2022-08-29 DIAGNOSIS — M6281 Muscle weakness (generalized): Secondary | ICD-10-CM

## 2022-09-03 ENCOUNTER — Encounter (HOSPITAL_BASED_OUTPATIENT_CLINIC_OR_DEPARTMENT_OTHER): Payer: Self-pay | Admitting: Physical Therapy

## 2022-09-03 ENCOUNTER — Ambulatory Visit (HOSPITAL_BASED_OUTPATIENT_CLINIC_OR_DEPARTMENT_OTHER): Payer: Medicare Other | Attending: Family Medicine | Admitting: Physical Therapy

## 2022-09-03 DIAGNOSIS — R2681 Unsteadiness on feet: Secondary | ICD-10-CM | POA: Diagnosis present

## 2022-09-03 DIAGNOSIS — R262 Difficulty in walking, not elsewhere classified: Secondary | ICD-10-CM | POA: Insufficient documentation

## 2022-09-03 DIAGNOSIS — M6281 Muscle weakness (generalized): Secondary | ICD-10-CM | POA: Diagnosis present

## 2022-09-03 DIAGNOSIS — M5459 Other low back pain: Secondary | ICD-10-CM | POA: Diagnosis present

## 2022-09-03 NOTE — Therapy (Signed)
OUTPATIENT PHYSICAL THERAPY NEURO TREATMENT   Patient Name: [name] MRN: [id number] DOB:1945-05-27, 77 y.o., female Today's Date: 09/03/2022   PCP: Garlan Fillers, MD REFERRING PROVIDER: Olevia Perches, NP  END OF SESSION:         Past Medical History:  Diagnosis Date   Acute blood loss as cause of postoperative anemia 04/03/2016   Adenomatous colon polyp    Anemia    hx of   Anxiety    takes Xanax daily as needed   Arthritis    Bronchitis    a month ago   Chronic back pain    stenosis   Chronic kidney disease    stage 3-sees Dr.J Patel   Class 3 obesity due to excess calories with body mass index (BMI) of 40.0 to 44.9 in adult    Diabetes mellitus    type II; takes Byetta and Invokana daily   Diverticulosis    Glaucoma    uses Eye Drops   Gout    takes Uloric daily   Hepatic steatosis    History of blood transfusion    no abnormal reaction    History of colon polyps    Hyperlipidemia    no meds taken now   Hypertension    takes HCTZ daily as well as Aldactone   Hypothyroidism    takes Synthroid daily   Pneumonia    many yrs ago   PONV (postoperative nausea and vomiting)    Primary localized osteoarthritis of right knee    Stress incontinence    Syncope    Venous stenosis of right upper extremity    Weakness    numbness and tingling in right hip/eg/   Wears glasses    Wolf-Parkinson-White syndrome    takes Atenolol daily   Past Surgical History:  Procedure Laterality Date   ABDOMINAL HYSTERECTOMY     APPENDECTOMY     BACK SURGERY  2006   lumb fusion   BACK SURGERY  08/16/2014   L3-4 fusion   BREAST BIOPSY     BREAST ENHANCEMENT SURGERY Bilateral    BREAST EXCISIONAL BIOPSY Left    benign   CARDIAC CATHETERIZATION  >70yrs ago   CARPAL TUNNEL RELEASE Bilateral    x4   cataract surgery Bilateral    CESAREAN SECTION     x 3   CHOLECYSTECTOMY  1981   COLONOSCOPY     IR ANGIO EXTERNAL CAROTID SEL EXT CAROTID UNI R MOD SED   06/12/2022   IR ANGIO INTRA EXTRACRAN SEL COM CAROTID INNOMINATE UNI L MOD SED  06/12/2022   IR ANGIO INTRA EXTRACRAN SEL INTERNAL CAROTID UNI R MOD SED  06/12/2022   IR ANGIO VERTEBRAL SEL VERTEBRAL BILAT MOD SED  06/12/2022   KNEE ARTHROSCOPY Right 2009   rt   RADIOLOGY WITH ANESTHESIA N/A 05/12/2020   Procedure: MRI LUMBAR SPINE WITH AND WITHOUT CONTRAST;  Surgeon: Radiologist, Medication, MD;  Location: [facility] OR;  Service: Radiology;  Laterality: N/A;   RAZ procedure  10+yrs ago   REDUCTION MAMMAPLASTY Bilateral 2006   ROTATOR CUFF REPAIR Right    x 4   SHOULDER ARTHROSCOPY WITH ROTATOR CUFF REPAIR AND SUBACROMIAL DECOMPRESSION Right 06/10/2012   Procedure: SHOULDER ARTHROSCOPY WITH ROTATOR CUFF REPAIR AND SUBACROMIAL DECOMPRESSION;  Surgeon: Nilda Simmer, MD;  Location: [facility];  Service: Orthopedics;  Laterality: Right;  RIGHT SHOULDER ARTHROSCOPY, DECOMPRESSION SUBACROMIAL PARTIAL ACROMIOPLASTY WITH CORACOACROMIAL RELEASE,, DISTAL CLAVICULECTOMY , ROTATOR CUFF debriedment   SKIN  BIOPSY     removed moles all over body   THYROID SURGERY Right    right thyroid removed   TOTAL KNEE ARTHROPLASTY Right 04/02/2016   Procedure: RIGHT TOTAL KNEE ARTHROPLASTY;  Surgeon: Salvatore Marvel, MD;  Location: [facility];  Service: Orthopedics;  Laterality: Right;   TRANSFORAMINAL LUMBAR INTERBODY FUSION (TLIF) WITH PEDICLE SCREW FIXATION 1 LEVEL Left 08/16/2020   Procedure: Left Lumbar two-three Transforaminal lumbar interbody fusion with exploration and extension of adjacent level fusion;  Surgeon: Maeola Harman, MD;  Location: [facility];  Service: Neurosurgery;  Laterality: Left;   Patient Active Problem List   Diagnosis Date Noted   Migraine without status migrainosus, not intractable 02/26/2022   Stroke-like symptom In the setting of illness (vomiting, diarrhea, 3 vaccinations) 02/26/2022   Degenerative lumbar spinal stenosis 08/16/2020   Acute blood loss as cause of postoperative anemia  04/03/2016   Class 3 obesity due to excess calories with body mass index (BMI) of 40.0 to 44.9 in adult    Primary localized osteoarthritis of right knee    CKD (chronic kidney disease), stage III (HCC) 10/22/2015   Syncope 10/21/2015   Non-insulin dependent type 2 diabetes mellitus (HCC) 10/21/2015   Hypothyroidism 10/21/2015   Lumbar stenosis with neurogenic claudication 08/16/2014   Right rotator cuff tear    Anxiety    PONV (postoperative nausea and vomiting)    GERD (gastroesophageal reflux disease)    Arthritis    Thyroid disease    DIVERTICULOSIS-COLON 07/04/2009   Iron deficiency anemia 03/17/2009   PERSONAL HX COLONIC POLYPS 03/17/2009   SKIN RASH 02/23/2009   DIARRHEA 02/23/2009   FEVER, HX OF 02/23/2009   THYROID NODULE, LEFT 02/11/2009   Glaucoma 02/11/2009   Coronary atherosclerosis 02/11/2009   WOLFF (WOLFE)-PARKINSON-WHITE (WPW) SYNDROME 02/11/2009   DEGENERATIVE JOINT DISEASE, ANKLE 02/11/2009   Osteoarthritis of spine 02/11/2009   Essential hypertension 02/09/2009    ONSET DATE: 1-2 months  REFERRING DIAG: R26.81 (ICD-10-CM) - Unsteadiness on feet  THERAPY DIAG:  No diagnosis found.  Rationale for Evaluation and Treatment: Rehabilitation  SUBJECTIVE:                                                                                                                                                                                             SUBJECTIVE STATEMENT: Feel pretty good today. Got a TENS unit from [facility]. Got a lot of relief from E-stim last weekend. L>R ankle edematous- patient reports that this is a chronic problem d/t needing a L ankle fusion.   Pt accompanied by: self  PERTINENT HISTORY: right TKR, lumbar fusion x 3  PAIN:  Are you having pain? Yes: NPRS scale: 1-2/10 Pain location: right LB Pain description: uncomfortable Aggravating factors: right sidelying Relieving factors: left sidelying, ice pack  PRECAUTIONS: None  WEIGHT BEARING  RESTRICTIONS: No  FALLS: Has patient fallen in last 6 months? Yes. Number of falls 1  LIVING ENVIRONMENT: Lives with: lives alone Lives in: House/apartment Stairs: Yes: External: 2 steps; bilateral but cannot reach both Has following equipment at home: Single point cane, Walker - 4 wheeled, shower chair, and Grab bars  PLOF: Independent and Independent with household mobility without device  PATIENT GOALS: reduce pain and improve activity  OBJECTIVE:     TODAY'S TREATMENT: 09/04/22 Activity Comments                        HOME EXERCISE PROGRAM: Access Code: [email or web address] URL: [email or web address] Date: 07/25/2022 Prepared by: Shary Decamp  Exercises - Supine Hip Adduction Isometric with Ball  - 1 x daily - 7 x weekly - 3 sets - 10 reps - 2 sec hold - Hooklying Clamshell with Resistance  - 1 x daily - 7 x weekly - 3 sets - 10 reps - 2 sec hold - Supine Piriformis Stretch with Leg Straight  - 1 x daily - 7 x weekly - 3 sets - 10 deep breaths hold - Seated Piriformis Stretch  - 1 x daily - 7 x weekly - 3 sets - 10 deep breaths hold - Standing Hip Abduction with Resistance at Ankles and Counter Support  - 1 x daily - 7 x weekly - 2 sets - 10 reps - Standing Hip Extension with Resistance at Ankles and Counter Support  - 1 x daily - 7 x weekly - 2 sets - 10 reps    Below measures were taken at time of initial evaluation unless otherwise specified:   DIAGNOSTIC FINDINGS:   COGNITION: Overall cognitive status: Within functional limits for tasks assessed   SENSATION: WFL  COORDINATION: WNL  EDEMA:  none  MUSCLE TONE: WNL  MUSCLE LENGTH: Hamstring WNL Piriformis tight/restricted R > L  PALPATION: Right piriformis muscle belly/sciatic notch very tender to palpation, trigger point appreciated   POSTURE: No Significant postural limitations  LOWER EXTREMITY ROM:     WFL  LOWER EXTREMITY MMT:    MMT Right Eval Left Eval  Hip flexion 3 4   Hip extension    Hip abduction 3+ 3+  Hip adduction 3 3  Hip internal rotation 3- 3+  Hip external rotation 3 (painful to resistance) 4  Knee flexion 3+ 4  Knee extension 4 5  Ankle dorsiflexion 5 5  Ankle plantarflexion    Ankle inversion    Ankle eversion    (Blank rows = not tested)  BED MOBILITY:  Modified indep, increased time  TRANSFERS: Assistive device utilized: None  Sit to stand: Complete Independence and Modified independence Stand to sit: Complete Independence Chair to chair: Complete Independence and Modified independence Floor:  NT     GAIT: Gait pattern: antalgic Distance walked:  Assistive device utilized: None Level of assistance: Complete Independence and Modified independence Comments: incr time  FUNCTIONAL TESTS:  5 times sit to stand: 35 sec 10 meter walk test: NT Single leg stance: able to lift leg and maintain balance, 1-2 sec       GOALS: Goals reviewed with patient? Yes  SHORT TERM GOALS: Target date: 08/15/2022    Patient will be independent in HEP to improve functional outcomes Baseline: reports understanding 08/13/22 Goal  status: MET 08/13/22  2.  Report pain in right buttocks not exceeding 4/10 with gait and exercise to improve activity tolerance Baseline: 7/10; reports pain usually increases after these activities but the worst at night 08/13/22 Goal status: IN PROGRESS 08/13/22    LONG TERM GOALS: Target date: 09/05/2022    Improve BLE strength and balance per time of 20 sec 5xSTS Baseline: 35 sec Goal status: IN PROGRESS  2.  Pt to report right buttock pain not exceeding 2/10 with activity and sleep to improve gait tolerance and comfort Baseline: 7/10 Goal status: IN PROGRESS  3.  Demo ability to maintain 2.62 ft/sec ambulation without increase in right buttock pain to improve community ambulation Baseline: 1.29 ft/sec 08/13/22 Goal status: IN PROGRESS 08/13/22  4.  Improve single limb support/strength as evidenced by  ability to maintain single limb stance x 10 sec Baseline: unable Goal status: IN PROGRESS    ASSESSMENT:  CLINICAL IMPRESSION: Patient arrived to session with report of improvement in LB pain levels. Worked on resisted core strengthening with pulley resistance. Patient required cues for proper foot position and posture. Most difficulty was evident with anti-rotation strengthening. Hip strengthening with weighted resistance revealed decreased amplitude on R side and patient reported some discomfort, thus removed ankle weights and continued. Balance activities required UE support d/t some hesitancy and instability. Patient reported LB feeling "worn out' but no pain upon leaving.   OBJECTIVE IMPAIRMENTS: Abnormal gait, decreased activity tolerance, decreased balance, decreased mobility, difficulty walking, decreased strength, impaired perceived functional ability, increased muscle spasms, and pain.   ACTIVITY LIMITATIONS: carrying, sitting, squatting, stairs, transfers, and locomotion level  PARTICIPATION LIMITATIONS: cleaning, laundry, driving, shopping, community activity, and fitness  PERSONAL FACTORS: Age, Time since onset of injury/illness/exacerbation, and 1-2 comorbidities: obesity, right TKR  are also affecting patient's functional outcome.   REHAB POTENTIAL: Excellent  CLINICAL DECISION MAKING: Evolving/moderate complexity  EVALUATION COMPLEXITY: Moderate  PLAN:  PT FREQUENCY: 1-2x/week  PT DURATION: 6 weeks  PLANNED INTERVENTIONS: Therapeutic exercises, Therapeutic activity, Neuromuscular re-education, Balance training, Gait training, Patient/Family education, Self Care, Joint mobilization, Stair training, Vestibular training, Canalith repositioning, Orthotic/Fit training, DME instructions, Aquatic Therapy, Dry Needling, Electrical stimulation, Spinal mobilization, Cryotherapy, Moist heat, Taping, Ultrasound, Ionotophoresis 4mg /ml Dexamethasone, and Manual therapy  PLAN FOR  NEXT SESSION: hip strengthening, balance, HEP review   Anette Guarneri, PT, DPT 09/03/22 4:34 PM  [facility] at [facility] [address], [address] [place], [place] [address] Phone # [phone] Fax # [phone]

## 2022-09-03 NOTE — Therapy (Signed)
OUTPATIENT PHYSICAL THERAPY NEURO TREATMENT   Patient Name: [name] MRN: [id number] DOB:1946/02/12, 77 y.o., female Today's Date: 09/03/2022   PCP: Garlan Fillers, MD REFERRING PROVIDER: Olevia Perches, NP  END OF SESSION:  PT End of Session - 09/03/22 1256     Visit Number 9    Number of Visits 12    Date for PT Re-Evaluation 09/05/22    Authorization Type [employer]    Progress Note Due on Visit 10    PT Start Time 1200    PT Stop Time 1240    PT Time Calculation (min) 40 min    Behavior During Therapy [facility] for tasks assessed/performed                   Past Medical History:  Diagnosis Date   Acute blood loss as cause of postoperative anemia 04/03/2016   Adenomatous colon polyp    Anemia    hx of   Anxiety    takes Xanax daily as needed   Arthritis    Bronchitis    a month ago   Chronic back pain    stenosis   Chronic kidney disease    stage 3-sees Dr.J Patel   Class 3 obesity due to excess calories with body mass index (BMI) of 40.0 to 44.9 in adult    Diabetes mellitus    type II; takes Byetta and Invokana daily   Diverticulosis    Glaucoma    uses Eye Drops   Gout    takes Uloric daily   Hepatic steatosis    History of blood transfusion    no abnormal reaction    History of colon polyps    Hyperlipidemia    no meds taken now   Hypertension    takes HCTZ daily as well as Aldactone   Hypothyroidism    takes Synthroid daily   Pneumonia    many yrs ago   PONV (postoperative nausea and vomiting)    Primary localized osteoarthritis of right knee    Stress incontinence    Syncope    Venous stenosis of right upper extremity    Weakness    numbness and tingling in right hip/eg/   Wears glasses    Wolf-Parkinson-White syndrome    takes Atenolol daily   Past Surgical History:  Procedure Laterality Date   ABDOMINAL HYSTERECTOMY     APPENDECTOMY     BACK SURGERY  2006   lumb fusion   BACK SURGERY  08/16/2014    L3-4 fusion   BREAST BIOPSY     BREAST ENHANCEMENT SURGERY Bilateral    BREAST EXCISIONAL BIOPSY Left    benign   CARDIAC CATHETERIZATION  >46yrs ago   CARPAL TUNNEL RELEASE Bilateral    x4   cataract surgery Bilateral    CESAREAN SECTION     x 3   CHOLECYSTECTOMY  1981   COLONOSCOPY     IR ANGIO EXTERNAL CAROTID SEL EXT CAROTID UNI R MOD SED  06/12/2022   IR ANGIO INTRA EXTRACRAN SEL COM CAROTID INNOMINATE UNI L MOD SED  06/12/2022   IR ANGIO INTRA EXTRACRAN SEL INTERNAL CAROTID UNI R MOD SED  06/12/2022   IR ANGIO VERTEBRAL SEL VERTEBRAL BILAT MOD SED  06/12/2022   KNEE ARTHROSCOPY Right 2009   rt   RADIOLOGY WITH ANESTHESIA N/A 05/12/2020   Procedure: MRI LUMBAR SPINE WITH AND WITHOUT CONTRAST;  Surgeon: Radiologist, Medication, MD;  Location: [facility] OR;  Service: Radiology;  Laterality: N/A;   RAZ procedure  10+yrs ago   REDUCTION MAMMAPLASTY Bilateral 2006   ROTATOR CUFF REPAIR Right    x 4   SHOULDER ARTHROSCOPY WITH ROTATOR CUFF REPAIR AND SUBACROMIAL DECOMPRESSION Right 06/10/2012   Procedure: SHOULDER ARTHROSCOPY WITH ROTATOR CUFF REPAIR AND SUBACROMIAL DECOMPRESSION;  Surgeon: Nilda Simmer, MD;  Location: [facility];  Service: Orthopedics;  Laterality: Right;  RIGHT SHOULDER ARTHROSCOPY, DECOMPRESSION SUBACROMIAL PARTIAL ACROMIOPLASTY WITH CORACOACROMIAL RELEASE,, DISTAL CLAVICULECTOMY , ROTATOR CUFF debriedment   SKIN BIOPSY     removed moles all over body   THYROID SURGERY Right    right thyroid removed   TOTAL KNEE ARTHROPLASTY Right 04/02/2016   Procedure: RIGHT TOTAL KNEE ARTHROPLASTY;  Surgeon: Salvatore Marvel, MD;  Location: [facility] OR;  Service: Orthopedics;  Laterality: Right;   TRANSFORAMINAL LUMBAR INTERBODY FUSION (TLIF) WITH PEDICLE SCREW FIXATION 1 LEVEL Left 08/16/2020   Procedure: Left Lumbar two-three Transforaminal lumbar interbody fusion with exploration and extension of adjacent level fusion;  Surgeon: Maeola Harman, MD;  Location: [facility] OR;  Service:  Neurosurgery;  Laterality: Left;   Patient Active Problem List   Diagnosis Date Noted   Migraine without status migrainosus, not intractable 02/26/2022   Stroke-like symptom In the setting of illness (vomiting, diarrhea, 3 vaccinations) 02/26/2022   Degenerative lumbar spinal stenosis 08/16/2020   Acute blood loss as cause of postoperative anemia 04/03/2016   Class 3 obesity due to excess calories with body mass index (BMI) of 40.0 to 44.9 in adult    Primary localized osteoarthritis of right knee    CKD (chronic kidney disease), stage III (HCC) 10/22/2015   Syncope 10/21/2015   Non-insulin dependent type 2 diabetes mellitus (HCC) 10/21/2015   Hypothyroidism 10/21/2015   Lumbar stenosis with neurogenic claudication 08/16/2014   Right rotator cuff tear    Anxiety    PONV (postoperative nausea and vomiting)    GERD (gastroesophageal reflux disease)    Arthritis    Thyroid disease    DIVERTICULOSIS-COLON 07/04/2009   Iron deficiency anemia 03/17/2009   PERSONAL HX COLONIC POLYPS 03/17/2009   SKIN RASH 02/23/2009   DIARRHEA 02/23/2009   FEVER, HX OF 02/23/2009   THYROID NODULE, LEFT 02/11/2009   Glaucoma 02/11/2009   Coronary atherosclerosis 02/11/2009   WOLFF (WOLFE)-PARKINSON-WHITE (WPW) SYNDROME 02/11/2009   DEGENERATIVE JOINT DISEASE, ANKLE 02/11/2009   Osteoarthritis of spine 02/11/2009   Essential hypertension 02/09/2009    ONSET DATE: 1-2 months  REFERRING DIAG: R26.81 (ICD-10-CM) - Unsteadiness on feet  THERAPY DIAG:  Unsteadiness on feet  Muscle weakness (generalized)  Difficulty walking  Other low back pain  Rationale for Evaluation and Treatment: Rehabilitation  SUBJECTIVE:  SUBJECTIVE STATEMENT: Pt hasn't received TENS unit yet;  anticipate it in next few days.  She has  had relief with estim in clinic.  She rested this weekend, "so I'm doing pretty good".   Pt accompanied by: self  PERTINENT HISTORY: right TKR, lumbar fusion x 3  PAIN:  Are you having pain? Yes: NPRS scale: 5/10 Pain location: right LB Pain description: uncomfortable Aggravating factors: right sidelying Relieving factors: left sidelying, ice pack  PRECAUTIONS: None  WEIGHT BEARING RESTRICTIONS: No  FALLS: Has patient fallen in last 6 months? Yes. Number of falls 1  LIVING ENVIRONMENT: Lives with: lives alone Lives in: House/apartment Stairs: Yes: External: 2 steps; bilateral but cannot reach both Has following equipment at home: Single point cane, Walker - 4 wheeled, shower chair, and Grab bars  PLOF: Independent and Independent with household mobility without device  PATIENT GOALS: reduce pain and improve activity  OBJECTIVE:   TODAY'S TREATMENT: 09/03/22 Pt seen for aquatic therapy today.  Treatment took place in water 3.5-4.75 ft in depth at the [facility] pool. Temp of water was 91.  Pt entered/exited the pool via stairs and step to pattern with hand rail.   *UE on barbell:  walking forward/ backward and side stepping  * standard and staggered stance with row motion with barbell 3 x 10 * UE on barbell:  high knee marching forward (LOB in L stance- able to self correct); SLS - up to 6 sec each LE * holding wall: heel/toe raises x 10; 1/2 diamonds (single clam) x 10 each LE;  *L stretch x 3 reps; cues for form * seated on bench in water: cycling * STS at bench in water x 5, cues for neutral head/spine and hip hinge; repeated with blue step under feet x 4 * return to walking forward in deeper water with barbell *TrA set with Noodle pull down for core engagement: wide stance  x 10    Pt requires the buoyancy and hydrostatic pressure of water for support, and to offload joints by unweighting joint load by at least 50 % in navel deep water and by at least 75-80% in  chest to neck deep water.  Viscosity of the water is needed for resistance of strengthening. Water current perturbations provides challenge to standing balance requiring increased core activation.   TREATMENT: 08/29/22 Activity Comments  pulley: TB row 2x10 20# TB shoulder extension 2x10 15# paloff press 2x10 10# Cues to depress shoulders; adjustment of foot position for max stability, cueing to avoid forward trunk lean. Most difficulty with anti-rotation    standing hip ABD 2# 2x10 Good form   standing hip EX 2# 10x, 0# 10x  More difficulty on R; cues to avoid anterior lean; advised pt not to continue and push into pain if LB is exacerbated   mini squat 2x10 Good form  alt sidestepping on foam 10x Cueing for safe foot placement; required B UE support  backwards walking along counter  1 UE support     HOME EXERCISE PROGRAM: Access Code: [email or web address] URL: [email or web address] Date: 07/25/2022 Prepared by: Shary Decamp  Exercises - Supine Hip Adduction Isometric with Ball  - 1 x daily - 7 x weekly - 3 sets - 10 reps - 2 sec hold - Hooklying Clamshell with Resistance  - 1 x daily - 7 x weekly - 3 sets - 10 reps - 2 sec hold - Supine Piriformis Stretch with Leg Straight  - 1 x daily - 7 x weekly - 3  sets - 10 deep breaths hold - Seated Piriformis Stretch  - 1 x daily - 7 x weekly - 3 sets - 10 deep breaths hold - Standing Hip Abduction with Resistance at Ankles and Counter Support  - 1 x daily - 7 x weekly - 2 sets - 10 reps - Standing Hip Extension with Resistance at Ankles and Counter Support  - 1 x daily - 7 x weekly - 2 sets - 10 reps    Below measures were taken at time of initial evaluation unless otherwise specified:   DIAGNOSTIC FINDINGS:   COGNITION: Overall cognitive status: Within functional limits for tasks assessed   SENSATION: WFL  COORDINATION: WNL  EDEMA:  none  MUSCLE TONE: WNL  MUSCLE LENGTH: Hamstring WNL Piriformis tight/restricted R >  L  PALPATION: Right piriformis muscle belly/sciatic notch very tender to palpation, trigger point appreciated   POSTURE: No Significant postural limitations  LOWER EXTREMITY ROM:     WFL  LOWER EXTREMITY MMT:    MMT Right Eval Left Eval  Hip flexion 3 4  Hip extension    Hip abduction 3+ 3+  Hip adduction 3 3  Hip internal rotation 3- 3+  Hip external rotation 3 (painful to resistance) 4  Knee flexion 3+ 4  Knee extension 4 5  Ankle dorsiflexion 5 5  Ankle plantarflexion    Ankle inversion    Ankle eversion    (Blank rows = not tested)  BED MOBILITY:  Modified indep, increased time  TRANSFERS: Assistive device utilized: None  Sit to stand: Complete Independence and Modified independence Stand to sit: Complete Independence Chair to chair: Complete Independence and Modified independence Floor:  NT     GAIT: Gait pattern: antalgic Distance walked:  Assistive device utilized: None Level of assistance: Complete Independence and Modified independence Comments: incr time  FUNCTIONAL TESTS:  5 times sit to stand: 35 sec 10 meter walk test: NT Single leg stance: able to lift leg and maintain balance, 1-2 sec       GOALS: Goals reviewed with patient? Yes  SHORT TERM GOALS: Target date: 08/15/2022    Patient will be independent in HEP to improve functional outcomes Baseline: reports understanding 08/13/22 Goal status: MET 08/13/22  2.  Report pain in right buttocks not exceeding 4/10 with gait and exercise to improve activity tolerance Baseline: 7/10; reports pain usually increases after these activities but the worst at night 08/13/22 Goal status: IN PROGRESS 08/13/22    LONG TERM GOALS: Target date: 09/05/2022    Improve BLE strength and balance per time of 20 sec 5xSTS Baseline: 35 sec Goal status: IN PROGRESS  2.  Pt to report right buttock pain not exceeding 2/10 with activity and sleep to improve gait tolerance and comfort Baseline: 7/10 Goal  status: IN PROGRESS  3.  Demo ability to maintain 2.62 ft/sec ambulation without increase in right buttock pain to improve community ambulation Baseline: 1.29 ft/sec 08/13/22 Goal status: IN PROGRESS 08/13/22  4.  Improve single limb support/strength as evidenced by ability to maintain single limb stance x 10 sec Baseline: unable Goal status: IN PROGRESS    ASSESSMENT:  CLINICAL IMPRESSION: Pt with decreased balance in L stance with high knee marching; able to self-correct from LOB. Staggered stance with LLE in back also provides a balance challenge. She reported some increase in Rt low back discomfort with L stretch; reduced with seated cycling.  She is observed walking unsupported at end of session without difficulty - will recommend she  use less UE support in water in future.    Pt's POC ends on 5/8;  PT to assess goals for progress note and recert vs d/c.    OBJECTIVE IMPAIRMENTS: Abnormal gait, decreased activity tolerance, decreased balance, decreased mobility, difficulty walking, decreased strength, impaired perceived functional ability, increased muscle spasms, and pain.   ACTIVITY LIMITATIONS: carrying, sitting, squatting, stairs, transfers, and locomotion level  PARTICIPATION LIMITATIONS: cleaning, laundry, driving, shopping, community activity, and fitness  PERSONAL FACTORS: Age, Time since onset of injury/illness/exacerbation, and 1-2 comorbidities: obesity, right TKR  are also affecting patient's functional outcome.   REHAB POTENTIAL: Excellent  CLINICAL DECISION MAKING: Evolving/moderate complexity  EVALUATION COMPLEXITY: Moderate  PLAN:  PT FREQUENCY: 1-2x/week  PT DURATION: 6 weeks  PLANNED INTERVENTIONS: Therapeutic exercises, Therapeutic activity, Neuromuscular re-education, Balance training, Gait training, Patient/Family education, Self Care, Joint mobilization, Stair training, Vestibular training, Canalith repositioning, Orthotic/Fit training, DME  instructions, Aquatic Therapy, Dry Needling, Electrical stimulation, Spinal mobilization, Cryotherapy, Moist heat, Taping, Ultrasound, Ionotophoresis 4mg /ml Dexamethasone, and Manual therapy  PLAN FOR NEXT SESSION: see above.  Mayer Camel, PTA 09/03/22 12:57 PM [facility] [address] [place], [place], [address] Phone: [phone]   Fax:  [phone]

## 2022-09-04 ENCOUNTER — Encounter: Payer: Self-pay | Admitting: Physical Therapy

## 2022-09-04 ENCOUNTER — Ambulatory Visit: Payer: Medicare Other | Admitting: Physical Therapy

## 2022-09-04 DIAGNOSIS — R2681 Unsteadiness on feet: Secondary | ICD-10-CM

## 2022-09-04 DIAGNOSIS — R262 Difficulty in walking, not elsewhere classified: Secondary | ICD-10-CM

## 2022-09-04 DIAGNOSIS — M5459 Other low back pain: Secondary | ICD-10-CM

## 2022-09-04 DIAGNOSIS — M6281 Muscle weakness (generalized): Secondary | ICD-10-CM

## 2022-09-05 ENCOUNTER — Ambulatory Visit: Payer: Medicare Other | Admitting: Physical Therapy

## 2022-09-10 ENCOUNTER — Other Ambulatory Visit (HOSPITAL_COMMUNITY): Payer: Self-pay

## 2022-09-10 ENCOUNTER — Encounter (HOSPITAL_BASED_OUTPATIENT_CLINIC_OR_DEPARTMENT_OTHER): Payer: Self-pay | Admitting: Physical Therapy

## 2022-09-10 ENCOUNTER — Ambulatory Visit (HOSPITAL_BASED_OUTPATIENT_CLINIC_OR_DEPARTMENT_OTHER): Payer: Medicare Other | Admitting: Physical Therapy

## 2022-09-10 DIAGNOSIS — R2681 Unsteadiness on feet: Secondary | ICD-10-CM

## 2022-09-10 DIAGNOSIS — M6281 Muscle weakness (generalized): Secondary | ICD-10-CM

## 2022-09-10 DIAGNOSIS — R262 Difficulty in walking, not elsewhere classified: Secondary | ICD-10-CM

## 2022-09-10 NOTE — Therapy (Signed)
OUTPATIENT PHYSICAL THERAPY NOTE   Patient Name: [name] MRN: [id number] DOB:1945/05/26, 77 y.o., female Today's Date: 09/10/2022   PCP: Garlan Fillers, MD REFERRING PROVIDER: Olevia Perches, NP     END OF SESSION:  PT End of Session - 09/10/22 1207     Visit Number 11    Number of Visits 14    Date for PT Re-Evaluation 10/16/22    Authorization Type [employer]    PT Start Time 1201    PT Stop Time 1240    PT Time Calculation (min) 39 min                   Past Medical History:  Diagnosis Date   Acute blood loss as cause of postoperative anemia 04/03/2016   Adenomatous colon polyp    Anemia    hx of   Anxiety    takes Xanax daily as needed   Arthritis    Bronchitis    a month ago   Chronic back pain    stenosis   Chronic kidney disease    stage 3-sees Dr.J Patel   Class 3 obesity due to excess calories with body mass index (BMI) of 40.0 to 44.9 in adult    Diabetes mellitus    type II; takes Byetta and Invokana daily   Diverticulosis    Glaucoma    uses Eye Drops   Gout    takes Uloric daily   Hepatic steatosis    History of blood transfusion    no abnormal reaction    History of colon polyps    Hyperlipidemia    no meds taken now   Hypertension    takes HCTZ daily as well as Aldactone   Hypothyroidism    takes Synthroid daily   Pneumonia    many yrs ago   PONV (postoperative nausea and vomiting)    Primary localized osteoarthritis of right knee    Stress incontinence    Syncope    Venous stenosis of right upper extremity    Weakness    numbness and tingling in right hip/eg/   Wears glasses    Wolf-Parkinson-White syndrome    takes Atenolol daily   Past Surgical History:  Procedure Laterality Date   ABDOMINAL HYSTERECTOMY     APPENDECTOMY     BACK SURGERY  2006   lumb fusion   BACK SURGERY  08/16/2014   L3-4 fusion   BREAST BIOPSY     BREAST ENHANCEMENT SURGERY Bilateral    BREAST EXCISIONAL BIOPSY  Left    benign   CARDIAC CATHETERIZATION  >41yrs ago   CARPAL TUNNEL RELEASE Bilateral    x4   cataract surgery Bilateral    CESAREAN SECTION     x 3   CHOLECYSTECTOMY  1981   COLONOSCOPY     IR ANGIO EXTERNAL CAROTID SEL EXT CAROTID UNI R MOD SED  06/12/2022   IR ANGIO INTRA EXTRACRAN SEL COM CAROTID INNOMINATE UNI L MOD SED  06/12/2022   IR ANGIO INTRA EXTRACRAN SEL INTERNAL CAROTID UNI R MOD SED  06/12/2022   IR ANGIO VERTEBRAL SEL VERTEBRAL BILAT MOD SED  06/12/2022   KNEE ARTHROSCOPY Right 2009   rt   RADIOLOGY WITH ANESTHESIA N/A 05/12/2020   Procedure: MRI LUMBAR SPINE WITH AND WITHOUT CONTRAST;  Surgeon: Radiologist, Medication, MD;  Location: [facility] OR;  Service: Radiology;  Laterality: N/A;   RAZ procedure  10+yrs ago   REDUCTION MAMMAPLASTY Bilateral 2006  ROTATOR CUFF REPAIR Right    x 4   SHOULDER ARTHROSCOPY WITH ROTATOR CUFF REPAIR AND SUBACROMIAL DECOMPRESSION Right 06/10/2012   Procedure: SHOULDER ARTHROSCOPY WITH ROTATOR CUFF REPAIR AND SUBACROMIAL DECOMPRESSION;  Surgeon: Nilda Simmer, MD;  Location: [facility];  Service: Orthopedics;  Laterality: Right;  RIGHT SHOULDER ARTHROSCOPY, DECOMPRESSION SUBACROMIAL PARTIAL ACROMIOPLASTY WITH CORACOACROMIAL RELEASE,, DISTAL CLAVICULECTOMY , ROTATOR CUFF debriedment   SKIN BIOPSY     removed moles all over body   THYROID SURGERY Right    right thyroid removed   TOTAL KNEE ARTHROPLASTY Right 04/02/2016   Procedure: RIGHT TOTAL KNEE ARTHROPLASTY;  Surgeon: Salvatore Marvel, MD;  Location: [facility] OR;  Service: Orthopedics;  Laterality: Right;   TRANSFORAMINAL LUMBAR INTERBODY FUSION (TLIF) WITH PEDICLE SCREW FIXATION 1 LEVEL Left 08/16/2020   Procedure: Left Lumbar two-three Transforaminal lumbar interbody fusion with exploration and extension of adjacent level fusion;  Surgeon: Maeola Harman, MD;  Location: [facility];  Service: Neurosurgery;  Laterality: Left;   Patient Active Problem List   Diagnosis Date Noted   Migraine  without status migrainosus, not intractable 02/26/2022   Stroke-like symptom In the setting of illness (vomiting, diarrhea, 3 vaccinations) 02/26/2022   Degenerative lumbar spinal stenosis 08/16/2020   Acute blood loss as cause of postoperative anemia 04/03/2016   Class 3 obesity due to excess calories with body mass index (BMI) of 40.0 to 44.9 in adult    Primary localized osteoarthritis of right knee    CKD (chronic kidney disease), stage III (HCC) 10/22/2015   Syncope 10/21/2015   Non-insulin dependent type 2 diabetes mellitus (HCC) 10/21/2015   Hypothyroidism 10/21/2015   Lumbar stenosis with neurogenic claudication 08/16/2014   Right rotator cuff tear    Anxiety    PONV (postoperative nausea and vomiting)    GERD (gastroesophageal reflux disease)    Arthritis    Thyroid disease    DIVERTICULOSIS-COLON 07/04/2009   Iron deficiency anemia 03/17/2009   PERSONAL HX COLONIC POLYPS 03/17/2009   SKIN RASH 02/23/2009   DIARRHEA 02/23/2009   FEVER, HX OF 02/23/2009   THYROID NODULE, LEFT 02/11/2009   Glaucoma 02/11/2009   Coronary atherosclerosis 02/11/2009   WOLFF (WOLFE)-PARKINSON-WHITE (WPW) SYNDROME 02/11/2009   DEGENERATIVE JOINT DISEASE, ANKLE 02/11/2009   Osteoarthritis of spine 02/11/2009   Essential hypertension 02/09/2009    ONSET DATE: 1-2 months  REFERRING DIAG: R26.81 (ICD-10-CM) - Unsteadiness on feet  THERAPY DIAG:  Unsteadiness on feet  Muscle weakness (generalized)  Difficulty walking  Rationale for Evaluation and Treatment: Rehabilitation  SUBJECTIVE:  SUBJECTIVE STATEMENT: Pt reports she rested all weekend, mostly in bed. She had her daughter use rolling pin on hip/back and she used ice; she reported the lump is getting smaller. She left her cane in car today. Has not  received her TENS unit yet.   Pt accompanied by: self  PERTINENT HISTORY: right TKR, lumbar fusion x 3  PAIN:  Are you having pain? No: NPRS scale:  Pain location:  Pain description:  Aggravating factors: right sidelying Relieving factors: left sidelying, ice pack  PRECAUTIONS: None  WEIGHT BEARING RESTRICTIONS: No  FALLS: Has patient fallen in last 6 months? Yes. Number of falls 1  LIVING ENVIRONMENT: Lives with: lives alone Lives in: House/apartment Stairs: Yes: External: 2 steps; bilateral but cannot reach both Has following equipment at home: Single point cane, Walker - 4 wheeled, shower chair, and Grab bars  PLOF: Independent and Independent with household mobility without device  PATIENT GOALS: reduce pain and improve activity  OBJECTIVE:  Below measures were taken at time of initial evaluation unless otherwise specified:   DIAGNOSTIC FINDINGS:   COGNITION: Overall cognitive status: Within functional limits for tasks assessed   SENSATION: WFL  COORDINATION: WNL  EDEMA:  none  MUSCLE TONE: WNL  MUSCLE LENGTH: Hamstring WNL Piriformis tight/restricted R > L  PALPATION: Right piriformis muscle belly/sciatic notch very tender to palpation, trigger point appreciated   POSTURE: No Significant postural limitations  LOWER EXTREMITY ROM:     WFL  LOWER EXTREMITY MMT:    MMT Right Eval Left Eval  Hip flexion 3 4  Hip extension    Hip abduction 3+ 3+  Hip adduction 3 3  Hip internal rotation 3- 3+  Hip external rotation 3 (painful to resistance) 4  Knee flexion 3+ 4  Knee extension 4 5  Ankle dorsiflexion 5 5  Ankle plantarflexion    Ankle inversion    Ankle eversion    (Blank rows = not tested)  BED MOBILITY:  Modified indep, increased time  TRANSFERS: Assistive device utilized: None  Sit to stand: Complete Independence and Modified independence Stand to sit: Complete Independence Chair to chair: Complete Independence and Modified  independence Floor:  NT     GAIT: Gait pattern: antalgic Distance walked:  Assistive device utilized: None Level of assistance: Complete Independence and Modified independence Comments: incr time  FUNCTIONAL TESTS:  5 times sit to stand: 35 sec 10 meter walk test: NT Single leg stance: able to lift leg and maintain balance, 1-2 sec  TODAY'S TREATMENT: 09/10/22  Pt seen for aquatic therapy today.  Treatment took place in water 3.5-4.75 ft in depth at the [facility] pool. Temp of water was 91.  Pt entered/exited the pool via stairs and step to pattern with hand rail.   *without support:   walking forward/ backward * with arm addct with rainbow hand floats:  side stepping L/R * holding yellow noodle:  tandem stance x 20 sec each; tandem gait forward/backward 2 laps;   3 way toe tap x 5 each;  3 way straight leg kick x 5 each LE * return to walking with reciprocal arm swing * holding wall: heel/toe raises x 10; 1/2 diamonds (single clam) x 10 each LE;  *L stretch x 2 reps; cues for form and to not overstretch *TrA set with short blue noodle pull down for core engagement in standard stance  x 10; trial of single/ double rainbow hand float pull downs x 3 reps (challenge) * R/L hamstring stretches with  foot on 2nd step x 15s x 2 each LE (tight)   Pt requires the buoyancy and hydrostatic pressure of water for support, and to offload joints by unweighting joint load by at least 50 % in navel deep water and by at least 75-80% in chest to neck deep water.  Viscosity of the water is needed for resistance of strengthening. Water current perturbations provides challenge to standing balance requiring increased core activation.     PATIENT EDUCATION: Education details: aquatic progressions and modifications  Person educated: Patient Education method: Explanation, Demonstration, Tactile cues, and Verbal cues Education comprehension: verbalized understanding and returned  demonstration    HOME EXERCISE PROGRAM: Access Code: [email or web address] URL: [email or web address] Date: 07/25/2022 Prepared by: Shary Decamp  Exercises - Supine Hip Adduction Isometric with Ball  - 1 x daily - 7 x weekly - 3 sets - 10 reps - 2 sec hold - Hooklying Clamshell with Resistance  - 1 x daily - 7 x weekly - 3 sets - 10 reps - 2 sec hold - Supine Piriformis Stretch with Leg Straight  - 1 x daily - 7 x weekly - 3 sets - 10 deep breaths hold - Seated Piriformis Stretch  - 1 x daily - 7 x weekly - 3 sets - 10 deep breaths hold - Standing Hip Abduction with Resistance at Ankles and Counter Support  - 1 x daily - 7 x weekly - 2 sets - 10 reps - Standing Hip Extension with Resistance at Ankles and Counter Support  - 1 x daily - 7 x weekly - 2 sets - 10 reps     GOALS: Goals reviewed with patient? Yes  SHORT TERM GOALS: Target date: 08/15/2022    Patient will be independent in HEP to improve functional outcomes Baseline: reports understanding 08/13/22 Goal status: MET 08/13/22  2.  Report pain in right buttocks not exceeding 4/10 with gait and exercise to improve activity tolerance Baseline: 7/10; reports pain usually increases after these activities but the worst at night 08/13/22 Goal status: NOT MET 08/13/22    LONG TERM GOALS: Target date: 10/16/2022    Improve BLE strength and balance per time of 20 sec 5xSTS Baseline: 35 sec; 17.37 sec  Goal status: MET 09/04/22  2.  Pt to report right buttock pain not exceeding 2/10 with activity and sleep to improve gait tolerance and comfort Baseline: 7/10; reports in the past week pain has been 6-7/10 at its worst  Goal status: IN PROGRESS 09/04/22  3.  Demo ability to maintain 2.62 ft/sec ambulation without increase in right buttock pain to improve community ambulation Baseline: 1.29 ft/sec 08/13/22; 1.18 ft/sec 09/04/22 Goal status: IN PROGRESS 09/04/22  4.  Improve single limb support/strength as evidenced by ability to  maintain single limb stance x 10 sec Baseline: 10 sec on L, 6 sec on R 09/04/22 Goal status: MET 09/04/22    ASSESSMENT:  CLINICAL IMPRESSION: Pt tolerated aquatic session well without any production of symptoms.  Pt was issued laminated aquatic HEP in previous episode. Pt verbalized readiness to have land-based therapy after today's session.     OBJECTIVE IMPAIRMENTS: Abnormal gait, decreased activity tolerance, decreased balance, decreased mobility, difficulty walking, decreased strength, impaired perceived functional ability, increased muscle spasms, and pain.   ACTIVITY LIMITATIONS: carrying, sitting, squatting, stairs, transfers, and locomotion level  PARTICIPATION LIMITATIONS: cleaning, laundry, driving, shopping, community activity, and fitness  PERSONAL FACTORS: Age, Time since onset of injury/illness/exacerbation, and 1-2 comorbidities: obesity, right TKR  are also affecting patient's  functional outcome.   REHAB POTENTIAL: Excellent  CLINICAL DECISION MAKING: Evolving/moderate complexity  EVALUATION COMPLEXITY: Moderate  PLAN:  PT FREQUENCY: 1x/week  PT DURATION: 4 weeks  PLANNED INTERVENTIONS: Therapeutic exercises, Therapeutic activity, Neuromuscular re-education, Balance training, Gait training, Patient/Family education, Self Care, Joint mobilization, Stair training, Vestibular training, Canalith repositioning, Orthotic/Fit training, DME instructions, Aquatic Therapy, Dry Needling, Electrical stimulation, Spinal mobilization, Cryotherapy, Moist heat, Taping, Ultrasound, Ionotophoresis 4mg /ml Dexamethasone, and Manual therapy  PLAN FOR NEXT SESSION: pt requests no Nustep d/t pain; hip strengthening, balance, HEP review   Mayer Camel, PTA 09/10/22 12:52 PM [facility] [address] [place], [place], [address] Phone: [phone]   Fax:  [phone]

## 2022-09-11 ENCOUNTER — Ambulatory Visit: Payer: Medicare Other | Admitting: Podiatry

## 2022-09-11 ENCOUNTER — Encounter: Payer: Self-pay | Admitting: Podiatry

## 2022-09-11 DIAGNOSIS — Z780 Asymptomatic menopausal state: Secondary | ICD-10-CM | POA: Insufficient documentation

## 2022-09-11 DIAGNOSIS — L600 Ingrowing nail: Secondary | ICD-10-CM

## 2022-09-11 DIAGNOSIS — M545 Low back pain, unspecified: Secondary | ICD-10-CM | POA: Insufficient documentation

## 2022-09-11 NOTE — Progress Notes (Signed)
  Subjective:  Patient ID: TAIWAN SCHOOL, female    DOB: 04-04-1946,  MRN: [id number]  Chief Complaint  Patient presents with   Diabetes    Left great toe is numb - diabetic - concerned for ingrown toenail starting medially    77 y.o. female returns for follow-up of nail with the above complaint. History confirmed with patient.   Objective:  Physical Exam: warm, good capillary refill, no trophic changes or ulcerative lesions, normal DP and PT pulses and normal sensory exam.  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   Assessment:   1. Ingrowing left great toenail   2. Ingrowing right great toenail       Plan:  Patient was evaluated and treated and all questions answered.  Reevaluated the left hallux nail.  He does have some pincer nail deformity.  I debrided this in a slant back fashion.  Long-term we discussed with proceeding with matricectomy on this side at some point if it is worsening.  She will let me know if it gets worse.  Not particular bothersome just feels numb now.  No follow-ups on file.

## 2022-09-12 ENCOUNTER — Ambulatory Visit: Payer: Medicare Other

## 2022-09-19 ENCOUNTER — Ambulatory Visit: Payer: Medicare Other | Admitting: Physical Therapy

## 2022-09-26 ENCOUNTER — Ambulatory Visit: Payer: Medicare Other | Admitting: Physical Therapy

## 2022-10-01 ENCOUNTER — Ambulatory Visit (HOSPITAL_BASED_OUTPATIENT_CLINIC_OR_DEPARTMENT_OTHER): Payer: Medicare Other | Admitting: Physical Therapy

## 2022-10-02 ENCOUNTER — Other Ambulatory Visit (HOSPITAL_COMMUNITY): Payer: Self-pay

## 2022-10-02 ENCOUNTER — Other Ambulatory Visit: Payer: Self-pay

## 2022-10-03 ENCOUNTER — Other Ambulatory Visit (HOSPITAL_COMMUNITY): Payer: Self-pay

## 2022-10-05 NOTE — Therapy (Signed)
OUTPATIENT PHYSICAL THERAPY NEURO NOTE   Patient Name: [name] MRN: [id number] DOB:[birth year], 77 y.o., female Today's Date: 10/05/2022   PCP: Garlan Fillers, MD REFERRING PROVIDER: Olevia Perches, NP     END OF SESSION:          Past Medical History:  Diagnosis Date   Acute blood loss as cause of postoperative anemia 04/03/2016   Adenomatous colon polyp    Anemia    hx of   Anxiety    takes Xanax daily as needed   Arthritis    Bronchitis    a month ago   Chronic back pain    stenosis   Chronic kidney disease    stage 3-sees Dr.J Patel   Class 3 obesity due to excess calories with body mass index (BMI) of 40.0 to 44.9 in adult    Diabetes mellitus    type II; takes Byetta and Invokana daily   Diverticulosis    Glaucoma    uses Eye Drops   Gout    takes Uloric daily   Hepatic steatosis    History of blood transfusion    no abnormal reaction    History of colon polyps    Hyperlipidemia    no meds taken now   Hypertension    takes HCTZ daily as well as Aldactone   Hypothyroidism    takes Synthroid daily   Pneumonia    many yrs ago   PONV (postoperative nausea and vomiting)    Primary localized osteoarthritis of right knee    Stress incontinence    Syncope    Venous stenosis of right upper extremity    Weakness    numbness and tingling in right hip/eg/   Wears glasses    Wolf-Parkinson-White syndrome    takes Atenolol daily   Past Surgical History:  Procedure Laterality Date   ABDOMINAL HYSTERECTOMY     APPENDECTOMY     BACK SURGERY  2006   lumb fusion   BACK SURGERY  08/16/2014   L3-4 fusion   BREAST BIOPSY     BREAST ENHANCEMENT SURGERY Bilateral    BREAST EXCISIONAL BIOPSY Left    benign   CARDIAC CATHETERIZATION  >65yrs ago   CARPAL TUNNEL RELEASE Bilateral    x4   cataract surgery Bilateral    CESAREAN SECTION     x 3   CHOLECYSTECTOMY  1981   COLONOSCOPY     IR ANGIO EXTERNAL CAROTID SEL EXT CAROTID UNI R MOD SED   06/12/2022   IR ANGIO INTRA EXTRACRAN SEL COM CAROTID INNOMINATE UNI L MOD SED  06/12/2022   IR ANGIO INTRA EXTRACRAN SEL INTERNAL CAROTID UNI R MOD SED  06/12/2022   IR ANGIO VERTEBRAL SEL VERTEBRAL BILAT MOD SED  06/12/2022   KNEE ARTHROSCOPY Right 2009   rt   RADIOLOGY WITH ANESTHESIA N/A 05/12/2020   Procedure: MRI LUMBAR SPINE WITH AND WITHOUT CONTRAST;  Surgeon: Radiologist, Medication, MD;  Location: [facility] OR;  Service: Radiology;  Laterality: N/A;   RAZ procedure  10+yrs ago   REDUCTION MAMMAPLASTY Bilateral 2006   ROTATOR CUFF REPAIR Right    x 4   SHOULDER ARTHROSCOPY WITH ROTATOR CUFF REPAIR AND SUBACROMIAL DECOMPRESSION Right 06/10/2012   Procedure: SHOULDER ARTHROSCOPY WITH ROTATOR CUFF REPAIR AND SUBACROMIAL DECOMPRESSION;  Surgeon: Nilda Simmer, MD;  Location: [facility];  Service: Orthopedics;  Laterality: Right;  RIGHT SHOULDER ARTHROSCOPY, DECOMPRESSION SUBACROMIAL PARTIAL ACROMIOPLASTY WITH CORACOACROMIAL RELEASE,, DISTAL CLAVICULECTOMY , ROTATOR CUFF  debriedment   SKIN BIOPSY     removed moles all over body   THYROID SURGERY Right    right thyroid removed   TOTAL KNEE ARTHROPLASTY Right 04/02/2016   Procedure: RIGHT TOTAL KNEE ARTHROPLASTY;  Surgeon: Salvatore Marvel, MD;  Location: [facility] OR;  Service: Orthopedics;  Laterality: Right;   TRANSFORAMINAL LUMBAR INTERBODY FUSION (TLIF) WITH PEDICLE SCREW FIXATION 1 LEVEL Left 08/16/2020   Procedure: Left Lumbar two-three Transforaminal lumbar interbody fusion with exploration and extension of adjacent level fusion;  Surgeon: Maeola Harman, MD;  Location: [facility] OR;  Service: Neurosurgery;  Laterality: Left;   Patient Active Problem List   Diagnosis Date Noted   Chronic low back pain 09/11/2022   Menopause present 09/11/2022   Pulsatile tinnitus of right ear 03/15/2022   Right ear impacted cerumen 03/15/2022   Migraine without status migrainosus, not intractable 02/26/2022   Stroke-like symptom In the setting of illness  (vomiting, diarrhea, 3 vaccinations) 02/26/2022   Degenerative lumbar spinal stenosis 08/16/2020   Pain in left foot 03/06/2018   Acute blood loss as cause of postoperative anemia 04/03/2016   Class 3 obesity due to excess calories with body mass index (BMI) of 40.0 to 44.9 in adult    Primary localized osteoarthritis of right knee    CKD (chronic kidney disease), stage III (HCC) 10/22/2015   Syncope 10/21/2015   Non-insulin dependent type 2 diabetes mellitus (HCC) 10/21/2015   Hypothyroidism 10/21/2015   Lumbar stenosis with neurogenic claudication 08/16/2014   Degeneration of lumbar intervertebral disc 06/16/2014   Backache 06/11/2014   Right rotator cuff tear    Anxiety    PONV (postoperative nausea and vomiting)    GERD (gastroesophageal reflux disease)    Arthritis    Thyroid disease    DIVERTICULOSIS-COLON 07/04/2009   Iron deficiency anemia 03/17/2009   PERSONAL HX COLONIC POLYPS 03/17/2009   SKIN RASH 02/23/2009   DIARRHEA 02/23/2009   FEVER, HX OF 02/23/2009   THYROID NODULE, LEFT 02/11/2009   Glaucoma 02/11/2009   Coronary atherosclerosis 02/11/2009   WOLFF (WOLFE)-PARKINSON-WHITE (WPW) SYNDROME 02/11/2009   DEGENERATIVE JOINT DISEASE, ANKLE 02/11/2009   Osteoarthritis of spine 02/11/2009   Essential hypertension 02/09/2009    ONSET DATE: 1-2 months  REFERRING DIAG: R26.81 (ICD-10-CM) - Unsteadiness on feet  THERAPY DIAG:  No diagnosis found.  Rationale for Evaluation and Treatment: Rehabilitation  SUBJECTIVE:                                                                                                                                                                                             SUBJECTIVE STATEMENT: Not hurting really bad  todayMicah Flesher to [facility] yesterday. Has not received her TENS unit yet. Reports that she likes the Nustep but she has more pain after doing it, so requesting not to use this machine again.   Pt accompanied by:  self  PERTINENT HISTORY: right TKR, lumbar fusion x 3  PAIN:  Are you having pain? Yes: NPRS scale: 1-2/10 Pain location: right LB Pain description: uncomfortable Aggravating factors: right sidelying Relieving factors: left sidelying, ice pack  PRECAUTIONS: None  WEIGHT BEARING RESTRICTIONS: No  FALLS: Has patient fallen in last 6 months? Yes. Number of falls 1  LIVING ENVIRONMENT: Lives with: lives alone Lives in: House/apartment Stairs: Yes: External: 2 steps; bilateral but cannot reach both Has following equipment at home: Single point cane, Walker - 4 wheeled, shower chair, and Grab bars  PLOF: Independent and Independent with household mobility without device  PATIENT GOALS: reduce pain and improve activity  OBJECTIVE:    TODAY'S TREATMENT: 10/08/22 Activity Comments                        HOME EXERCISE PROGRAM: Access Code: [email or web address] URL: [email or web address] Date: 07/25/2022 Prepared by: Shary Decamp  Exercises - Supine Hip Adduction Isometric with Ball  - 1 x daily - 7 x weekly - 3 sets - 10 reps - 2 sec hold - Hooklying Clamshell with Resistance  - 1 x daily - 7 x weekly - 3 sets - 10 reps - 2 sec hold - Supine Piriformis Stretch with Leg Straight  - 1 x daily - 7 x weekly - 3 sets - 10 deep breaths hold - Seated Piriformis Stretch  - 1 x daily - 7 x weekly - 3 sets - 10 deep breaths hold - Standing Hip Abduction with Resistance at Ankles and Counter Support  - 1 x daily - 7 x weekly - 2 sets - 10 reps - Standing Hip Extension with Resistance at Ankles and Counter Support  - 1 x daily - 7 x weekly - 2 sets - 10 reps    Below measures were taken at time of initial evaluation unless otherwise specified:   DIAGNOSTIC FINDINGS:   COGNITION: Overall cognitive status: Within functional limits for tasks assessed   SENSATION: WFL  COORDINATION: WNL  EDEMA:  none  MUSCLE TONE: WNL  MUSCLE LENGTH: Hamstring WNL Piriformis  tight/restricted R > L  PALPATION: Right piriformis muscle belly/sciatic notch very tender to palpation, trigger point appreciated   POSTURE: No Significant postural limitations  LOWER EXTREMITY ROM:     WFL  LOWER EXTREMITY MMT:    MMT Right Eval Left Eval  Hip flexion 3 4  Hip extension    Hip abduction 3+ 3+  Hip adduction 3 3  Hip internal rotation 3- 3+  Hip external rotation 3 (painful to resistance) 4  Knee flexion 3+ 4  Knee extension 4 5  Ankle dorsiflexion 5 5  Ankle plantarflexion    Ankle inversion    Ankle eversion    (Blank rows = not tested)  BED MOBILITY:  Modified indep, increased time  TRANSFERS: Assistive device utilized: None  Sit to stand: Complete Independence and Modified independence Stand to sit: Complete Independence Chair to chair: Complete Independence and Modified independence Floor:  NT     GAIT: Gait pattern: antalgic Distance walked:  Assistive device utilized: None Level of assistance: Complete Independence and Modified independence Comments: incr time  FUNCTIONAL TESTS:  5 times sit to stand: 35 sec 10 meter walk  test: NT Single leg stance: able to lift leg and maintain balance, 1-2 sec       GOALS: Goals reviewed with patient? Yes  SHORT TERM GOALS: Target date: 08/15/2022    Patient will be independent in HEP to improve functional outcomes Baseline: reports understanding 08/13/22 Goal status: MET 08/13/22  2.  Report pain in right buttocks not exceeding 4/10 with gait and exercise to improve activity tolerance Baseline: 7/10; reports pain usually increases after these activities but the worst at night 08/13/22 Goal status: NOT MET 08/13/22    LONG TERM GOALS: Target date: 10/16/2022    Improve BLE strength and balance per time of 20 sec 5xSTS Baseline: 35 sec; 17.37 sec  Goal status: MET 09/04/22  2.  Pt to report right buttock pain not exceeding 2/10 with activity and sleep to improve gait tolerance and  comfort Baseline: 7/10; reports in the past week pain has been 6-7/10 at its worst  Goal status: IN PROGRESS 09/04/22  3.  Demo ability to maintain 2.62 ft/sec ambulation without increase in right buttock pain to improve community ambulation Baseline: 1.29 ft/sec 08/13/22; 1.18 ft/sec 09/04/22 Goal status: IN PROGRESS 09/04/22  4.  Improve single limb support/strength as evidenced by ability to maintain single limb stance x 10 sec Baseline: 10 sec on L, 6 sec on R 09/04/22 Goal status: MET 09/04/22    ASSESSMENT:  CLINICAL IMPRESSION: Patient arrived to session with report of continued low pain levels. Reports that she continues to address pain levels with self-management techniques and reports good benefit from aquatic therapy and hot tub. Gait speed unchanged today with 27M walk test, however patient states and demonstrates improved stability with ambulation. Patient has met speed goal for STS and balance goal for SLS. Does still report pain at worst up to 6-7/10, thus would benefit from continued skilled PT services 1x/week for 4 weeks to address remaining pain levels and gait speed.   OBJECTIVE IMPAIRMENTS: Abnormal gait, decreased activity tolerance, decreased balance, decreased mobility, difficulty walking, decreased strength, impaired perceived functional ability, increased muscle spasms, and pain.   ACTIVITY LIMITATIONS: carrying, sitting, squatting, stairs, transfers, and locomotion level  PARTICIPATION LIMITATIONS: cleaning, laundry, driving, shopping, community activity, and fitness  PERSONAL FACTORS: Age, Time since onset of injury/illness/exacerbation, and 1-2 comorbidities: obesity, right TKR  are also affecting patient's functional outcome.   REHAB POTENTIAL: Excellent  CLINICAL DECISION MAKING: Evolving/moderate complexity  EVALUATION COMPLEXITY: Moderate  PLAN:  PT FREQUENCY: 1x/week  PT DURATION: 4 weeks  PLANNED INTERVENTIONS: Therapeutic exercises, Therapeutic  activity, Neuromuscular re-education, Balance training, Gait training, Patient/Family education, Self Care, Joint mobilization, Stair training, Vestibular training, Canalith repositioning, Orthotic/Fit training, DME instructions, Aquatic Therapy, Dry Needling, Electrical stimulation, Spinal mobilization, Cryotherapy, Moist heat, Taping, Ultrasound, Ionotophoresis 4mg /ml Dexamethasone, and Manual therapy  PLAN FOR NEXT SESSION: pt requests no Nustep d/t pain; hip strengthening, balance, HEP review   Anette Guarneri, PT, DPT 10/05/22 8:54 AM  [facility] [address] [place], [place] [address] Phone # [phone] Fax # [phone]

## 2022-10-08 ENCOUNTER — Ambulatory Visit: Payer: Medicare Other | Attending: Family Medicine | Admitting: Physical Therapy

## 2022-10-08 ENCOUNTER — Encounter: Payer: Self-pay | Admitting: Physical Therapy

## 2022-10-08 DIAGNOSIS — R262 Difficulty in walking, not elsewhere classified: Secondary | ICD-10-CM

## 2022-10-08 DIAGNOSIS — R2681 Unsteadiness on feet: Secondary | ICD-10-CM

## 2022-10-08 DIAGNOSIS — M5459 Other low back pain: Secondary | ICD-10-CM

## 2022-10-08 DIAGNOSIS — M6281 Muscle weakness (generalized): Secondary | ICD-10-CM | POA: Diagnosis present

## 2022-10-24 ENCOUNTER — Other Ambulatory Visit (HOSPITAL_COMMUNITY): Payer: Self-pay

## 2022-10-25 ENCOUNTER — Other Ambulatory Visit (HOSPITAL_COMMUNITY): Payer: Self-pay

## 2022-11-22 ENCOUNTER — Other Ambulatory Visit (HOSPITAL_COMMUNITY): Payer: Self-pay

## 2022-11-30 ENCOUNTER — Other Ambulatory Visit: Payer: Self-pay | Admitting: Nurse Practitioner

## 2022-12-21 ENCOUNTER — Other Ambulatory Visit (HOSPITAL_COMMUNITY): Payer: Self-pay

## 2022-12-30 ENCOUNTER — Other Ambulatory Visit: Payer: Self-pay | Admitting: Nurse Practitioner

## 2023-01-16 ENCOUNTER — Other Ambulatory Visit (HOSPITAL_COMMUNITY): Payer: Self-pay

## 2023-01-31 ENCOUNTER — Other Ambulatory Visit: Payer: Self-pay

## 2023-02-11 ENCOUNTER — Other Ambulatory Visit (HOSPITAL_COMMUNITY): Payer: Self-pay

## 2023-02-12 ENCOUNTER — Other Ambulatory Visit (HOSPITAL_COMMUNITY): Payer: Self-pay

## 2023-03-13 ENCOUNTER — Other Ambulatory Visit (HOSPITAL_COMMUNITY): Payer: Self-pay

## 2023-03-15 ENCOUNTER — Telehealth: Payer: Self-pay | Admitting: Gastroenterology

## 2023-03-15 NOTE — Telephone Encounter (Signed)
No future surveillance colonoscopies planned due to only one small adenomatous polyp and age. If her health status is very good can consider at colonoscopy at 8 years, Aug 2029.

## 2023-03-15 NOTE — Telephone Encounter (Signed)
Good morning Dr. Russella Dar   We received a call from this patient wishing to know when her Recall is for a colonoscopy, Patient last had a colonoscopy in 2021 but recall is not in Epic. Patient was last seen by [facility] in 04/2022, but not advised of when she would be due. She is now 12. She is aware you are retiring, however she would like to know when to call for future appointments. Would you please advise?   Thank you.

## 2023-03-19 NOTE — Telephone Encounter (Signed)
Patient was advised of Dr. Ardell Isaacs recommendation.

## 2023-04-08 ENCOUNTER — Other Ambulatory Visit (HOSPITAL_COMMUNITY): Payer: Self-pay

## 2023-04-09 ENCOUNTER — Other Ambulatory Visit: Payer: Self-pay | Admitting: Internal Medicine

## 2023-04-09 DIAGNOSIS — Z1231 Encounter for screening mammogram for malignant neoplasm of breast: Secondary | ICD-10-CM

## 2023-04-23 ENCOUNTER — Other Ambulatory Visit (HOSPITAL_COMMUNITY): Payer: Self-pay

## 2023-04-29 ENCOUNTER — Other Ambulatory Visit (HOSPITAL_COMMUNITY): Payer: Self-pay

## 2023-05-17 ENCOUNTER — Ambulatory Visit
Admission: RE | Admit: 2023-05-17 | Discharge: 2023-05-17 | Disposition: A | Discharge status: Home / Self Care | Payer: Medicare Other | Source: Ambulatory Visit | Attending: Internal Medicine | Admitting: Internal Medicine

## 2023-05-17 DIAGNOSIS — Z1231 Encounter for screening mammogram for malignant neoplasm of breast: Secondary | ICD-10-CM

## 2023-05-28 ENCOUNTER — Other Ambulatory Visit (HOSPITAL_COMMUNITY): Payer: Self-pay

## 2023-05-30 ENCOUNTER — Other Ambulatory Visit (HOSPITAL_COMMUNITY): Payer: Self-pay

## 2023-06-06 ENCOUNTER — Other Ambulatory Visit (HOSPITAL_COMMUNITY): Payer: Self-pay

## 2023-06-06 MED ORDER — MOUNJARO 12.5 MG/0.5ML ~~LOC~~ SOAJ
12.5000 mg | SUBCUTANEOUS | 11 refills | Status: DC
  Filled 2023-07-02: qty 2, 28d supply, fill #0
  Filled 2023-07-29: qty 2, 28d supply, fill #1
  Filled 2023-08-22: qty 2, 28d supply, fill #2
  Filled 2023-09-24: qty 2, 28d supply, fill #3
  Filled 2023-10-22: qty 2, 28d supply, fill #4
  Filled 2023-11-06 – 2023-12-03 (×2): qty 2, 28d supply, fill #5
  Filled 2023-12-19 – 2023-12-24 (×2): qty 2, 28d supply, fill #6
  Filled 2024-01-23: qty 2, 28d supply, fill #7
  Filled 2024-02-20: qty 2, 28d supply, fill #8

## 2023-06-07 ENCOUNTER — Other Ambulatory Visit (HOSPITAL_COMMUNITY): Payer: Self-pay

## 2023-06-12 ENCOUNTER — Other Ambulatory Visit: Payer: Self-pay

## 2023-07-02 ENCOUNTER — Other Ambulatory Visit: Payer: Self-pay | Admitting: Nurse Practitioner

## 2023-07-02 ENCOUNTER — Other Ambulatory Visit (HOSPITAL_COMMUNITY): Payer: Self-pay

## 2023-07-29 ENCOUNTER — Other Ambulatory Visit (HOSPITAL_COMMUNITY): Payer: Self-pay

## 2023-08-01 ENCOUNTER — Other Ambulatory Visit: Payer: Self-pay | Admitting: Nurse Practitioner

## 2023-08-02 NOTE — Telephone Encounter (Signed)
 Patient has been scheduled for 5/15 at 9:00 with [facility]. Refill has been sent.

## 2023-08-22 ENCOUNTER — Other Ambulatory Visit (HOSPITAL_COMMUNITY): Payer: Self-pay

## 2023-09-02 NOTE — Progress Notes (Deleted)
 HPI: Follow-up WPW. 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 Monitor August 2017 showed sinus rhythm and occasional morphologic appearance of WPW. Nuclear study December 2019 showed ejection fraction 62% and normal perfusion. Coronary CTA January 2021 showed calcium score 62 which was 68 percentile and minimal plaque in the LAD and RCA. Cerebral arteriogram February 2024 was normal with no significant AV malformation, aneurysm or fistula. No stenosis noted. Since last seen   Current Outpatient Medications  Medication Sig Dispense Refill   ALPRAZolam  (XANAX ) 0.5 MG tablet Take 0.5 mg by mouth 3 (three) times daily as needed for anxiety.      aspirin  81 MG chewable tablet Chew 81 mg by mouth daily.     atenolol  (TENORMIN ) 25 MG tablet Take 25 mg by mouth daily.     ciclopirox  (PENLAC ) 8 % solution Apply topically at bedtime. Apply over nail and surrounding skin. Apply daily over previous coat. After seven  days, remove with alcohol and continue cycle. (Patient not taking: Reported on 08/24/2022) 6.6 mL 2   COVID-19 mRNA bivalent vaccine, Pfizer, (PFIZER COVID-19 VAC BIVALENT) injection Inject into the muscle. 0.3 mL 0   dicyclomine  (BENTYL ) 10 MG capsule TAKE 1 CAPSULE(10 MG) BY MOUTH THREE TIMES DAILY BEFORE MEALS 30 capsule 0   febuxostat  (ULORIC ) 40 MG tablet Take 40 mg by mouth daily.      furosemide  (LASIX ) 20 MG tablet Take 20 mg by mouth daily.     HYDROmorphone  (DILAUDID ) 2 MG tablet Take 0.5-1 tablets (1-2 mg total) by mouth every 4 (four) hours as needed for severe pain. (Patient not taking: Reported on 08/24/2022) 30 tablet 0   latanoprost  (XALATAN ) 0.005 % ophthalmic solution Place 1 drop into both eyes at bedtime.     levothyroxine  (SYNTHROID , LEVOTHROID) 50 MCG tablet Take 50-100 mcg by mouth See admin instructions. Take 50 mcg daily, except 100 mcg by mouth on Sunday     methocarbamol  (ROBAXIN ) 500 MG tablet Take  1 tablet (500 mg total) by mouth every 6 (six) hours as needed for muscle spasms. 90 tablet 0   Multiple Vitamins-Minerals (MULTIVITAMIN WITH MINERALS) tablet Take 1 tablet by mouth daily.     pravastatin  (PRAVACHOL ) 20 MG tablet Take 20 mg by mouth every evening.     telmisartan (MICARDIS) 40 MG tablet Take 40 mg by mouth daily.     tirzepatide  (MOUNJARO ) 12.5 MG/0.5ML Pen Inject 1 pen as directed into the skin once weekly 2 mL 2   tirzepatide  (MOUNJARO ) 12.5 MG/0.5ML Pen Inject 12.5 mg into the skin once a week 6 mL 1   tirzepatide  (MOUNJARO ) 12.5 MG/0.5ML Pen Inject 12.5 mg into the skin once a week. 2 mL 5   tirzepatide  (MOUNJARO ) 12.5 MG/0.5ML Pen Inject 12.5 mg into the skin once a week. 2 mL 11   Current Facility-Administered Medications  Medication Dose Route Frequency Provider Last Rate Last Admin   0.9 %  sodium chloride  infusion  500 mL Intravenous Once Asencion Blacksmith, MD         Past Medical History:  Diagnosis Date   Acute blood loss as cause of postoperative anemia 04/03/2016   Adenomatous colon polyp    Anemia    hx of   Anxiety    takes Xanax  daily as needed   Arthritis    Bronchitis    a month ago   Chronic back pain    stenosis   Chronic kidney disease  stage 3-sees Dr.J Lydia Sams   Class 3 obesity due to excess calories with body mass index (BMI) of 40.0 to 44.9 in adult    Diabetes mellitus    type II; takes Byetta  and Invokana  daily   Diverticulosis    Glaucoma    uses Eye Drops   Gout    takes Uloric  daily   Hepatic steatosis    History of blood transfusion    no abnormal reaction    History of colon polyps    Hyperlipidemia    no meds taken now   Hypertension    takes HCTZ daily as well as Aldactone    Hypothyroidism    takes Synthroid  daily   Pneumonia    many yrs ago   PONV (postoperative nausea and vomiting)    Primary localized osteoarthritis of right knee    Stress incontinence    Syncope    Venous stenosis of right upper extremity     Weakness    numbness and tingling in right hip/eg/   Wears glasses    Wolf-Parkinson-White syndrome    takes Atenolol  daily    Past Surgical History:  Procedure Laterality Date   ABDOMINAL HYSTERECTOMY     APPENDECTOMY     BACK SURGERY  2006   lumb fusion   BACK SURGERY  08/16/2014   L3-4 fusion   BREAST BIOPSY     BREAST ENHANCEMENT SURGERY Bilateral    BREAST EXCISIONAL BIOPSY Left    benign   CARDIAC CATHETERIZATION  >70yrs ago   CARPAL TUNNEL RELEASE Bilateral    x4   cataract surgery Bilateral    CESAREAN SECTION     x 3   CHOLECYSTECTOMY  1981   COLONOSCOPY     IR ANGIO EXTERNAL CAROTID SEL EXT CAROTID UNI R MOD SED  06/12/2022   IR ANGIO INTRA EXTRACRAN SEL COM CAROTID INNOMINATE UNI L MOD SED  06/12/2022   IR ANGIO INTRA EXTRACRAN SEL INTERNAL CAROTID UNI R MOD SED  06/12/2022   IR ANGIO VERTEBRAL SEL VERTEBRAL BILAT MOD SED  06/12/2022   KNEE ARTHROSCOPY Right 2009   rt   RADIOLOGY WITH ANESTHESIA N/A 05/12/2020   Procedure: MRI LUMBAR SPINE WITH AND WITHOUT CONTRAST;  Surgeon: Radiologist, Medication, MD;  Location: [facility] OR;  Service: Radiology;  Laterality: N/A;   RAZ procedure  10+yrs ago   REDUCTION MAMMAPLASTY Bilateral 2006   ROTATOR CUFF REPAIR Right    x 4   SHOULDER ARTHROSCOPY WITH ROTATOR CUFF REPAIR AND SUBACROMIAL DECOMPRESSION Right 06/10/2012   Procedure: SHOULDER ARTHROSCOPY WITH ROTATOR CUFF REPAIR AND SUBACROMIAL DECOMPRESSION;  Surgeon: Genevie Kerns, MD;  Location: [facility];  Service: Orthopedics;  Laterality: Right;  RIGHT SHOULDER ARTHROSCOPY, DECOMPRESSION SUBACROMIAL PARTIAL ACROMIOPLASTY WITH CORACOACROMIAL RELEASE,, DISTAL CLAVICULECTOMY , ROTATOR CUFF debriedment   SKIN BIOPSY     removed moles all over body   THYROID  SURGERY Right    right thyroid  removed   TOTAL KNEE ARTHROPLASTY Right 04/02/2016   Procedure: RIGHT TOTAL KNEE ARTHROPLASTY;  Surgeon: Elly Habermann, MD;  Location: [facility] OR;  Service: Orthopedics;  Laterality:  Right;   TRANSFORAMINAL LUMBAR INTERBODY FUSION (TLIF) WITH PEDICLE SCREW FIXATION 1 LEVEL Left 08/16/2020   Procedure: Left Lumbar two-three Transforaminal lumbar interbody fusion with exploration and extension of adjacent level fusion;  Surgeon: Manya Sells, MD;  Location[facility] OR;  Service: Neurosurgery;  Laterality: Left;    Social History   Socioeconomic History   Marital status: Single    Spouse  name: Not on file   Number of children: Not on file   Years of education: Not on file   Highest education level: Not on file  Occupational History   Not on file  Tobacco Use   Smoking status: Former    Current packs/day: 0.00    Average packs/day: 0.5 packs/day for 15.0 years (7.5 ttl pk-yrs)    Types: Cigarettes    Start date: 59    Quit date: 48    Years since quitting: 47.3   Smokeless tobacco: Never  Vaping Use   Vaping status: Never Used  Substance and Sexual Activity   Alcohol use: No   Drug use: No   Sexual activity: Not on file    Comment: Hysterectomy  Other Topics Concern   Not on file  Social History Narrative   Not on file   Social Drivers of Health   Financial Resource Strain: Not on file  Food Insecurity: Not on file  Transportation Needs: Not on file  Physical Activity: Not on file  Stress: Not on file  Social Connections: Not on file  Intimate Partner Violence: Not on file    Family History  Problem Relation Age of Onset   Diabetes Mother    Stroke Mother    Heart attack Father    Hypertension Other    Arthritis Other    Breast cancer Neg Hx    Colon cancer Neg Hx    Esophageal cancer Neg Hx    Rectal cancer Neg Hx    Stomach cancer Neg Hx    Migraines Neg Hx     ROS: no fevers or chills, productive cough, hemoptysis, dysphasia, odynophagia, melena, hematochezia, dysuria, hematuria, rash, seizure activity, orthopnea, PND, pedal edema, claudication. Remaining systems are negative.  Physical Exam: Well-developed well-nourished in no acute  distress.  Skin is warm and dry.  HEENT is normal.  Neck is supple.  Chest is clear to auscultation with normal expansion.  Cardiovascular exam is regular rate and rhythm.  Abdominal exam nontender or distended. No masses palpated. Extremities show no edema. neuro grossly intact  ECG- personally reviewed  A/P  1 coronary artery disease-minimal plaque noted on previous coronary CTA. No CP.    2 hypertension-BP controlled.  Continue present medications.   3 hyperlipidemia-continue statin.   5 history of WPW-no recent symptoms.  Alexandria Angel, MD

## 2023-09-12 ENCOUNTER — Ambulatory Visit: Admitting: Nurse Practitioner

## 2023-09-12 ENCOUNTER — Encounter: Payer: Self-pay | Admitting: Nurse Practitioner

## 2023-09-12 VITALS — BP 110/60 | HR 63 | Ht 63.0 in | Wt 176.0 lb

## 2023-09-12 DIAGNOSIS — K580 Irritable bowel syndrome with diarrhea: Secondary | ICD-10-CM

## 2023-09-12 DIAGNOSIS — D649 Anemia, unspecified: Secondary | ICD-10-CM

## 2023-09-12 DIAGNOSIS — N183 Chronic kidney disease, stage 3 unspecified: Secondary | ICD-10-CM | POA: Diagnosis not present

## 2023-09-12 DIAGNOSIS — K589 Irritable bowel syndrome without diarrhea: Secondary | ICD-10-CM

## 2023-09-12 DIAGNOSIS — Z860100 Personal history of colon polyps, unspecified: Secondary | ICD-10-CM

## 2023-09-12 DIAGNOSIS — Z860101 Personal history of adenomatous and serrated colon polyps: Secondary | ICD-10-CM

## 2023-09-12 MED ORDER — DICYCLOMINE HCL 10 MG PO CAPS: 10.0000 mg | ORAL_CAPSULE | Freq: Three times a day (TID) | ORAL | 3 refills | Status: AC | PRN

## 2023-09-12 NOTE — Patient Instructions (Signed)
 Avoid using dairy products.  Follow up in a year.  _______________________________________________________  If your blood pressure at your visit was 140/90 or greater, please contact your primary care physician to follow up on this.  _______________________________________________________  If you are age 77 or older, your body mass index should be between 23-30. Your Body mass index is 31.18 kg/m. If this is out of the aforementioned range listed, please consider follow up with your Primary Care Provider.  If you are age 52 or younger, your body mass index should be between 19-25. Your Body mass index is 31.18 kg/m. If this is out of the aformentioned range listed, please consider follow up with your Primary Care Provider.   ________________________________________________________  The [facility] providers would like to encourage you to use MYCHART to communicate with providers for non-urgent requests or questions.  Due to long hold times on the telephone, sending your provider a message by [facility] may be a faster and more efficient way to get a response.  Please allow 48 business hours for a response.  Please remember that this is for non-urgent requests.  _______________________________________________________   It was a pleasure to see you today!  Thank you for trusting me with your gastrointestinal care!    Scott E.Cunningham,MD

## 2023-09-12 NOTE — Progress Notes (Signed)
 09/12/2023 [name] [id number] [birth year]   Chief Complaint: Dicyclomine  refill   History of Present Illness: [name] is a 78 year old female with a past medical history of anxiety, hypertension, hypercholesterolemia, WPW syndrome, DM type II, CKD stage III, chronic anemia, hypothyroidism, spinal stenosis, diverticulosis and colon polyps. Past cholecystectomy, hysterectomy, partial thyroidectomy, back and knee surgery. Previously known by Dr. Sandrea Cruel.  She presents today for her annual follow-up and to refill Bentyl  (Dicyclomine ) which she takes twice daily for IBS-D symptoms. She passes a normal bowel movement at least once daily as long as she takes Bentyl  twice daily.  She infrequently takes Bentyl  3 times daily.  No bloody or black stools. She denies experiencing any dizziness, lightheadedness or falls while taking Bentyl .  She has diarrhea if she has dairy products.  She denies having any abdominal pain. Her most recent colonoscopy was 12/15/2019 which identified 1 tubular adenomatous polyp which was removed from the ascending colon and random colon biopsies were negative for microscopic colitis or IBD.  Dr. Sandrea Cruel discussed no further colon polyp surveillance colonoscopy secondary to age versus considering 1 more colonoscopy 11/2027.  No known family history of colon cancer.  She has a history of chronic anemia, previously took oral iron daily and her PCP recently switched her to a multivitamin with iron.  Labs 09/03/2023 showed a iron level of 50, iron saturation 17%, TIBC 301 and ferritin 542.     Latest Ref Rng & Units 06/12/2022    7:05 AM 01/23/2022    8:55 PM 01/23/2022    8:40 PM  CBC  WBC 4.0 - 10.5 K/uL 4.7   6.3   Hemoglobin 12.0 - 15.0 g/dL 40.9  81.1  91.4   Hematocrit 36.0 - 46.0 % 35.3  35.0  34.6   Platelets 150 - 400 K/uL 283   242        Latest Ref Rng & Units 06/12/2022    7:05 AM 01/23/2022    8:55 PM 01/23/2022    8:40 PM  CMP  Glucose 70 - 99 mg/dL 782   956  213   BUN 8 - 23 mg/dL 27  31  25    Creatinine 0.44 - 1.00 mg/dL 0.86  5.78  4.69   Sodium 135 - 145 mmol/L 137  136  140   Potassium 3.5 - 5.1 mmol/L 4.2  3.9  3.7   Chloride 98 - 111 mmol/L 103  109  104   CO2 22 - 32 mmol/L 22   23   Calcium 8.9 - 10.3 mg/dL 62.9   9.7   Total Protein 6.5 - 8.1 g/dL   7.4   Total Bilirubin 0.3 - 1.2 mg/dL   0.4   Alkaline Phos 38 - 126 U/L   96   AST 15 - 41 U/L   15   ALT 0 - 44 U/L   12     Labs 09/03/2023: Iron 50.  Iron saturation 17%.  TIBC 301.  Ferritin 542.  Colonoscopy 12/15/2019 by Dr. Sandrea Cruel: - Ileal diverticula, otherwise normal appearing. - One 7 mm polyp in the ascending colon, removed with a cold snare. Resected and retrieved. - Mild diverticulosis in the right colon. - Moderate diverticulosis in the left colon. - The exam was otherwise without abnormality on direct and retroflexion views. Random biopsies obtained No future surveillance colonoscopies planned due to only one small adenomatous polyp and age. If her health status is very good can consider  at colonoscopy at 8 years, Aug 2029.  1. Surgical [P], colon, random sites - COLONIC MUCOSA WITH NO SIGNIFICANT HISTOPATHOLOGIC CHANGES. - NO MICROSCOPIC COLITIS, ACTIVE INFLAMMATION OR CHRONIC CHANGES. 2. Surgical [P], colon, ascending, polyp - TUBULAR ADENOMA(S). - NO HIGH GRADE DYSPLASIA OR CARCINOMA   Current Outpatient Medications on File Prior to Visit  Medication Sig Dispense Refill   ALPRAZolam  (XANAX ) 0.5 MG tablet Take 0.5 mg by mouth 3 (three) times daily as needed for anxiety.      aspirin  81 MG chewable tablet Chew 81 mg by mouth daily.     atenolol  (TENORMIN ) 25 MG tablet Take 25 mg by mouth daily.     COVID-19 mRNA bivalent vaccine, Pfizer, (PFIZER COVID-19 VAC BIVALENT) injection Inject into the muscle. 0.3 mL 0   febuxostat  (ULORIC ) 40 MG tablet Take 40 mg by mouth daily.      furosemide  (LASIX ) 20 MG tablet Take 20 mg by mouth daily.     latanoprost  (XALATAN )  0.005 % ophthalmic solution Place 1 drop into both eyes at bedtime.     levothyroxine  (SYNTHROID , LEVOTHROID) 50 MCG tablet Take 50-100 mcg by mouth See admin instructions. Take 50 mcg daily, except 100 mcg by mouth on Sunday     methocarbamol  (ROBAXIN ) 500 MG tablet Take 1 tablet (500 mg total) by mouth every 6 (six) hours as needed for muscle spasms. 90 tablet 0   Multiple Vitamins-Minerals (MULTIVITAMIN WITH MINERALS) tablet Take 1 tablet by mouth daily.     pravastatin  (PRAVACHOL ) 20 MG tablet Take 20 mg by mouth every evening.     telmisartan (MICARDIS) 40 MG tablet Take 40 mg by mouth daily.     tirzepatide  (MOUNJARO ) 12.5 MG/0.5ML Pen Inject 1 pen as directed into the skin once weekly 2 mL 2   tirzepatide  (MOUNJARO ) 12.5 MG/0.5ML Pen Inject 12.5 mg into the skin once a week. 2 mL 11   Current Facility-Administered Medications on File Prior to Visit  Medication Dose Route Frequency Provider Last Rate Last Admin   0.9 %  sodium chloride  infusion  500 mL Intravenous Once Asencion Blacksmith, MD       Allergies  Allergen Reactions   Contrast Media [Iodinated Contrast Media] Shortness Of Breath and Other (See Comments)    bp elevated   Triptans Other (See Comments)    Possible TIA symptoms.   Adhesive [Tape] Other (See Comments)    Pulls skin off   Esomeprazole Magnesium  Hives   Morphine  And Codeine Other (See Comments)    Severe headache  chills   Talwin [Pentazocine] Other (See Comments)    hallucinations   Acetaminophen      Other reaction(s): Stomach pains,vomiting   Ceftriaxone     Other reaction(s): rash   Allopurinol Diarrhea   Codeine Nausea And Vomiting   Colchicine Diarrhea   Hydrocodone  Nausea And Vomiting   Meperidine  Hcl Nausea And Vomiting   Oxycodone Nausea And Vomiting   Pentazocine Lactate Nausea And Vomiting   Current Medications, Allergies, Past Medical History, Past Surgical History, Family History and Social History were reviewed in [employer] record.  Review of Systems:   Constitutional: Negative for fever, sweats, chills or weight loss.  Respiratory: Negative for shortness of breath.   Cardiovascular: Negative for chest pain, palpitations and leg swelling.  Gastrointestinal: See HPI.  Musculoskeletal: Negative for back pain or muscle aches.  Neurological: Negative for dizziness, headaches or paresthesias.   Physical Exam: BP 110/60   Pulse 63  Ht 5\' 3"  (1.6 m)   Wt 176 lb (79.8 kg)   LMP  (LMP Unknown)   BMI 31.18 kg/m   General: 78 year old female in no acute distress. Head: Normocephalic and atraumatic. Eyes: No scleral icterus. Conjunctiva pink . Ears: Normal auditory acuity. Mouth: Dentition intact. No ulcers or lesions.  Lungs: Clear throughout to auscultation. Heart: Regular rate and rhythm, no murmur. Abdomen: Soft, nontender and nondistended. No masses or hepatomegaly. Normal bowel sounds x 4 quadrants.  Rectal: Deferred.  Musculoskeletal: Symmetrical with no gross deformities. Extremities: No edema. Neurological: Alert oriented x 4. No focal deficits.  Psychological: Alert and cooperative. Normal mood and affect  Assessment and Recommendations:  78 year-old female with IBS-D is well-controlled on Dicyclomine  10 mg bid, infrequently takes 3 times daily -Dicyclomine  10 mg 1 p.o. 3 times daily, patient to stop if she develops dizziness, lightheadedness or balance issues -Benefiber 1 tablespoon daily as tolerated -Avoid dairy products -Follow-up in 1 year and as needed   History of a tubular adenomatous polyp per colonoscopy 11/2019.  No family history of colon cancer.  Dr. Sandrea Cruel previously discussed no further colon polyp surveillance colonoscopy secondary to age versus considering 1 more colonoscopy 11/2027   Mild chronic normocytic anemia, likely due to CKD. No overt GI bleeding. Labs 09/03/2023 showed a iron level of 50, iron saturation 17%, TIBC 301 and ferritin 542.  Recently stopped taking oral  iron, switch to a multivitamin with iron per PCP. -Continue anemia follow-up including routine CBC monitoring with PCP   CKD stage III  DM type II

## 2023-09-16 ENCOUNTER — Ambulatory Visit: Payer: Medicare Other | Admitting: Cardiology

## 2023-09-19 NOTE — Progress Notes (Signed)
 Addendum: Reviewed and agree with assessment and management plan. Asha Grumbine, Carie Caddy, MD

## 2023-09-24 ENCOUNTER — Other Ambulatory Visit (HOSPITAL_COMMUNITY): Payer: Self-pay

## 2023-09-27 ENCOUNTER — Other Ambulatory Visit (HOSPITAL_COMMUNITY): Payer: Self-pay

## 2023-10-22 ENCOUNTER — Other Ambulatory Visit (HOSPITAL_COMMUNITY): Payer: Self-pay

## 2023-10-25 ENCOUNTER — Other Ambulatory Visit (HOSPITAL_COMMUNITY): Payer: Self-pay

## 2023-11-06 ENCOUNTER — Other Ambulatory Visit (HOSPITAL_COMMUNITY): Payer: Self-pay

## 2023-11-06 NOTE — Progress Notes (Signed)
 HPI: Follow-up WPW. 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 Monitor August 2017 showed sinus rhythm and occasional morphologic appearance of WPW. Nuclear study December 2019 showed ejection fraction 62% and normal perfusion. Coronary CTA January 2021 showed calcium score 62 which was 68 percentile and minimal plaque in the LAD and RCA. Cerebral arteriogram February 2024 was normal with no significant AV malformation, aneurysm or fistula. No stenosis noted. Since last seen she denies dyspnea, chest pain, palpitations or syncope.  Current Outpatient Medications  Medication Sig Dispense Refill   ALPRAZolam  (XANAX ) 0.5 MG tablet Take 0.5 mg by mouth 3 (three) times daily as needed for anxiety.      aspirin  81 MG chewable tablet Chew 81 mg by mouth daily.     atenolol  (TENORMIN ) 25 MG tablet Take 25 mg by mouth daily.     Cholecalciferol (VITAMIN D3) 50 MCG (2000 UT) capsule Take 2,000 Units by mouth daily.     dicyclomine  (BENTYL ) 10 MG capsule Take 1 capsule (10 mg total) by mouth 3 (three) times daily as needed for spasms. 270 capsule 3   febuxostat  (ULORIC ) 40 MG tablet Take 40 mg by mouth daily.      furosemide  (LASIX ) 20 MG tablet Take 20 mg by mouth daily.     iron polysaccharides (NIFEREX) 150 MG capsule Take 150 mg by mouth daily.     latanoprost  (XALATAN ) 0.005 % ophthalmic solution Place 1 drop into both eyes at bedtime.     levothyroxine  (SYNTHROID , LEVOTHROID) 50 MCG tablet Take 50-100 mcg by mouth See admin instructions. Take 50 mcg daily, except 100 mcg by mouth on Sunday     rosuvastatin (CRESTOR) 20 MG tablet Take 20 mg by mouth daily.     telmisartan (MICARDIS) 40 MG tablet Take 40 mg by mouth daily.     tirzepatide  (MOUNJARO ) 12.5 MG/0.5ML Pen Inject 12.5 mg into the skin once a week. 2 mL 11   COVID-19 mRNA bivalent vaccine, Pfizer, (PFIZER COVID-19 VAC BIVALENT) injection Inject into the muscle. 0.3 mL 0    methocarbamol  (ROBAXIN ) 500 MG tablet Take 1 tablet (500 mg total) by mouth every 6 (six) hours as needed for muscle spasms. 90 tablet 0   Multiple Vitamins-Minerals (MULTIVITAMIN WITH MINERALS) tablet Take 1 tablet by mouth daily.     pravastatin  (PRAVACHOL ) 20 MG tablet Take 20 mg by mouth every evening.     tirzepatide  (MOUNJARO ) 12.5 MG/0.5ML Pen Inject 1 pen as directed into the skin once weekly 2 mL 2   Current Facility-Administered Medications  Medication Dose Route Frequency Provider Last Rate Last Admin   0.9 %  sodium chloride  infusion  500 mL Intravenous Once Aneita Gwendlyn DASEN, MD         Past Medical History:  Diagnosis Date   Acute blood loss as cause of postoperative anemia 04/03/2016   Adenomatous colon polyp    Anemia    hx of   Anxiety    takes Xanax  daily as needed   Arthritis    Bronchitis    a month ago   Chronic back pain    stenosis   Chronic kidney disease    stage 3-sees Dr.J Patel   Class 3 obesity due to excess calories with body mass index (BMI) of 40.0 to 44.9 in adult    Diabetes mellitus    type II; takes Byetta  and Invokana  daily   Diverticulosis    Glaucoma  uses Eye Drops   Gout    takes Uloric  daily   Hepatic steatosis    History of blood transfusion    no abnormal reaction    History of colon polyps    Hyperlipidemia    no meds taken now   Hypertension    takes HCTZ daily as well as Aldactone    Hypothyroidism    takes Synthroid  daily   Pneumonia    many yrs ago   PONV (postoperative nausea and vomiting)    Primary localized osteoarthritis of right knee    Stress incontinence    Syncope    Venous stenosis of right upper extremity    Weakness    numbness and tingling in right hip/eg/   Wears glasses    Wolf-Parkinson-White syndrome    takes Atenolol  daily    Past Surgical History:  Procedure Laterality Date   ABDOMINAL HYSTERECTOMY     APPENDECTOMY     BACK SURGERY  2006   lumb fusion   BACK SURGERY  08/16/2014    L3-4 fusion   BREAST BIOPSY     BREAST ENHANCEMENT SURGERY Bilateral    BREAST EXCISIONAL BIOPSY Left    benign   CARDIAC CATHETERIZATION  >27yrs ago   CARPAL TUNNEL RELEASE Bilateral    x4   cataract surgery Bilateral    CESAREAN SECTION     x 3   CHOLECYSTECTOMY  1981   COLONOSCOPY     IR ANGIO EXTERNAL CAROTID SEL EXT CAROTID UNI R MOD SED  06/12/2022   IR ANGIO INTRA EXTRACRAN SEL COM CAROTID INNOMINATE UNI L MOD SED  06/12/2022   IR ANGIO INTRA EXTRACRAN SEL INTERNAL CAROTID UNI R MOD SED  06/12/2022   IR ANGIO VERTEBRAL SEL VERTEBRAL BILAT MOD SED  06/12/2022   KNEE ARTHROSCOPY Right 2009   rt   RADIOLOGY WITH ANESTHESIA N/A 05/12/2020   Procedure: MRI LUMBAR SPINE WITH AND WITHOUT CONTRAST;  Surgeon: Radiologist, Medication, MD;  Location: [facility] OR;  Service: Radiology;  Laterality: N/A;   RAZ procedure  10+yrs ago   REDUCTION MAMMAPLASTY Bilateral 2006   ROTATOR CUFF REPAIR Right    x 4   SHOULDER ARTHROSCOPY WITH ROTATOR CUFF REPAIR AND SUBACROMIAL DECOMPRESSION Right 06/10/2012   Procedure: SHOULDER ARTHROSCOPY WITH ROTATOR CUFF REPAIR AND SUBACROMIAL DECOMPRESSION;  Surgeon: Lamar DELENA Millman, MD;  Location: [facility];  Service: Orthopedics;  Laterality: Right;  RIGHT SHOULDER ARTHROSCOPY, DECOMPRESSION SUBACROMIAL PARTIAL ACROMIOPLASTY WITH CORACOACROMIAL RELEASE,, DISTAL CLAVICULECTOMY , ROTATOR CUFF debriedment   SKIN BIOPSY     removed moles all over body   THYROID  SURGERY Right    right thyroid  removed   TOTAL KNEE ARTHROPLASTY Right 04/02/2016   Procedure: RIGHT TOTAL KNEE ARTHROPLASTY;  Surgeon: Lamar Millman, MD;  Location: [facility] OR;  Service: Orthopedics;  Laterality: Right;   TRANSFORAMINAL LUMBAR INTERBODY FUSION (TLIF) WITH PEDICLE SCREW FIXATION 1 LEVEL Left 08/16/2020   Procedure: Left Lumbar two-three Transforaminal lumbar interbody fusion with exploration and extension of adjacent level fusion;  Surgeon: Unice Pac, MD;  Location: [facility] OR;  Service:  Neurosurgery;  Laterality: Left;    Social History   Socioeconomic History   Marital status: Single    Spouse name: Not on file   Number of children: 4   Years of education: Not on file   Highest education level: Not on file  Occupational History   Not on file  Tobacco Use   Smoking status: Former    Current packs/day: 0.00  Average packs/day: 0.5 packs/day for 15.0 years (7.5 ttl pk-yrs)    Types: Cigarettes    Start date: 57    Quit date: 81    Years since quitting: 47.5   Smokeless tobacco: Never  Vaping Use   Vaping status: Never Used  Substance and Sexual Activity   Alcohol use: No   Drug use: No   Sexual activity: Not on file    Comment: Hysterectomy  Other Topics Concern   Not on file  Social History Narrative   Not on file   Social Drivers of Health   Financial Resource Strain: Not on file  Food Insecurity: Not on file  Transportation Needs: Not on file  Physical Activity: Not on file  Stress: Not on file  Social Connections: Not on file  Intimate Partner Violence: Not on file    Family History  Problem Relation Age of Onset   Diabetes Mother    Stroke Mother    Heart attack Father    Hypertension Other    Arthritis Other    Breast cancer Neg Hx    Colon cancer Neg Hx    Esophageal cancer Neg Hx    Rectal cancer Neg Hx    Stomach cancer Neg Hx    Migraines Neg Hx     ROS: no fevers or chills, productive cough, hemoptysis, dysphasia, odynophagia, melena, hematochezia, dysuria, hematuria, rash, seizure activity, orthopnea, PND, pedal edema, claudication. Remaining systems are negative.  Physical Exam: Well-developed well-nourished in no acute distress.  Skin is warm and dry.  HEENT is normal.  Neck is supple.  Chest is clear to auscultation with normal expansion.  Cardiovascular exam is regular rate and rhythm.  Abdominal exam nontender or distended. No masses palpated. Extremities show no edema. neuro grossly intact  EKG  Interpretation Date/Time:  Tuesday November 19 2023 08:15:28 EDT Ventricular Rate:  65 PR Interval:  168 QRS Duration:  88 QT Interval:  416 QTC Calculation: 432 R Axis:   -15  Text Interpretation: Normal sinus rhythm Moderate voltage criteria for LVH, may be normal variant ( R in aVL , Cornell product ) Confirmed by Pietro Rogue ([id number]) on 11/19/2023 8:26:30 AM    A/P  1 coronary artery disease-minimal on prior CTA.  Plan medical therapy.  Continue statin.  2 hyperlipidemia-continue statin.  Lipids and liver monitored by primary care.  3 hypertension-blood pressure controlled.  Continue present medications.  Potassium and renal function monitored by primary care.  4 history of WPW-patient denies recent palpitations and there is no history of syncope.  Follow-up ECG today shows no preexcitation.  Rogue Pietro, MD

## 2023-11-19 ENCOUNTER — Encounter: Payer: Self-pay | Admitting: Cardiology

## 2023-11-19 ENCOUNTER — Ambulatory Visit: Attending: Cardiology | Admitting: Cardiology

## 2023-11-19 VITALS — BP 100/60 | HR 65 | Ht 63.0 in | Wt 178.6 lb

## 2023-11-19 DIAGNOSIS — I2510 Atherosclerotic heart disease of native coronary artery without angina pectoris: Secondary | ICD-10-CM

## 2023-11-19 DIAGNOSIS — I456 Pre-excitation syndrome: Secondary | ICD-10-CM | POA: Diagnosis not present

## 2023-11-19 DIAGNOSIS — E785 Hyperlipidemia, unspecified: Secondary | ICD-10-CM

## 2023-11-19 DIAGNOSIS — I10 Essential (primary) hypertension: Secondary | ICD-10-CM | POA: Diagnosis not present

## 2023-11-19 NOTE — Patient Instructions (Signed)

## 2023-12-03 ENCOUNTER — Other Ambulatory Visit (HOSPITAL_COMMUNITY): Payer: Self-pay

## 2023-12-19 ENCOUNTER — Other Ambulatory Visit (HOSPITAL_COMMUNITY): Payer: Self-pay

## 2023-12-27 ENCOUNTER — Other Ambulatory Visit (HOSPITAL_COMMUNITY): Payer: Self-pay

## 2024-01-27 ENCOUNTER — Other Ambulatory Visit (HOSPITAL_COMMUNITY): Payer: Self-pay

## 2024-02-20 ENCOUNTER — Other Ambulatory Visit (HOSPITAL_COMMUNITY): Payer: Self-pay

## 2024-02-21 ENCOUNTER — Other Ambulatory Visit (HOSPITAL_COMMUNITY): Payer: Self-pay

## 2024-02-21 MED ORDER — MOUNJARO 12.5 MG/0.5ML ~~LOC~~ SOAJ
12.5000 mg | SUBCUTANEOUS | 1 refills | Status: AC
  Filled 2024-02-21: qty 6, 84d supply, fill #0
  Filled 2024-05-11: qty 6, 84d supply, fill #1

## 2024-02-22 ENCOUNTER — Other Ambulatory Visit (HOSPITAL_COMMUNITY): Payer: Self-pay

## 2024-02-23 ENCOUNTER — Other Ambulatory Visit (HOSPITAL_COMMUNITY): Payer: Self-pay

## 2024-02-24 ENCOUNTER — Other Ambulatory Visit (HOSPITAL_COMMUNITY): Payer: Self-pay

## 2024-02-24 MED ORDER — MOUNJARO 12.5 MG/0.5ML ~~LOC~~ SOAJ
12.5000 mg | SUBCUTANEOUS | 3 refills | Status: AC
  Filled 2024-02-24: qty 2, 28d supply, fill #0

## 2024-04-29 ENCOUNTER — Other Ambulatory Visit: Payer: Self-pay | Admitting: Internal Medicine

## 2024-04-29 DIAGNOSIS — Z1231 Encounter for screening mammogram for malignant neoplasm of breast: Secondary | ICD-10-CM

## 2024-05-11 ENCOUNTER — Other Ambulatory Visit (HOSPITAL_COMMUNITY): Payer: Self-pay

## 2024-05-20 ENCOUNTER — Ambulatory Visit
Admission: RE | Admit: 2024-05-20 | Discharge: 2024-05-20 | Disposition: A | Source: Ambulatory Visit | Attending: Internal Medicine | Admitting: Internal Medicine

## 2024-05-20 DIAGNOSIS — Z1231 Encounter for screening mammogram for malignant neoplasm of breast: Secondary | ICD-10-CM
